# Patient Record
Sex: Male | Born: 1970
Health system: Southern US, Community
[De-identification: ages and names within clinical notes are randomized; demographics above are authoritative.]

## PROBLEM LIST (undated history)

## (undated) ENCOUNTER — Emergency Department (HOSPITAL_COMMUNITY): Discharge status: Against Medical Advice | Payer: Managed Care, Other (non HMO)

## (undated) DIAGNOSIS — G4730 Sleep apnea, unspecified: Secondary | ICD-10-CM

## (undated) DIAGNOSIS — E785 Hyperlipidemia, unspecified: Secondary | ICD-10-CM

## (undated) DIAGNOSIS — Z86718 Personal history of other venous thrombosis and embolism: Secondary | ICD-10-CM

## (undated) DIAGNOSIS — J189 Pneumonia, unspecified organism: Secondary | ICD-10-CM

## (undated) DIAGNOSIS — F419 Anxiety disorder, unspecified: Secondary | ICD-10-CM

## (undated) DIAGNOSIS — T7840XA Allergy, unspecified, initial encounter: Secondary | ICD-10-CM

## (undated) DIAGNOSIS — E119 Type 2 diabetes mellitus without complications: Secondary | ICD-10-CM

## (undated) DIAGNOSIS — F32A Depression, unspecified: Secondary | ICD-10-CM

## (undated) DIAGNOSIS — A159 Respiratory tuberculosis unspecified: Secondary | ICD-10-CM

## (undated) DIAGNOSIS — G43909 Migraine, unspecified, not intractable, without status migrainosus: Secondary | ICD-10-CM

## (undated) DIAGNOSIS — Z9189 Other specified personal risk factors, not elsewhere classified: Secondary | ICD-10-CM

## (undated) DIAGNOSIS — R569 Unspecified convulsions: Secondary | ICD-10-CM

## (undated) DIAGNOSIS — N289 Disorder of kidney and ureter, unspecified: Secondary | ICD-10-CM

## (undated) DIAGNOSIS — R51 Headache: Secondary | ICD-10-CM

## (undated) DIAGNOSIS — N200 Calculus of kidney: Secondary | ICD-10-CM

## (undated) DIAGNOSIS — G40909 Epilepsy, unspecified, not intractable, without status epilepticus: Secondary | ICD-10-CM

## (undated) DIAGNOSIS — E669 Obesity, unspecified: Secondary | ICD-10-CM

## (undated) DIAGNOSIS — J45909 Unspecified asthma, uncomplicated: Secondary | ICD-10-CM

## (undated) DIAGNOSIS — I82409 Acute embolism and thrombosis of unspecified deep veins of unspecified lower extremity: Secondary | ICD-10-CM

## (undated) DIAGNOSIS — F329 Major depressive disorder, single episode, unspecified: Secondary | ICD-10-CM

## (undated) HISTORY — DX: Obesity, unspecified: E66.9

## (undated) HISTORY — PX: VASECTOMY: SHX75

## (undated) HISTORY — DX: Sleep apnea, unspecified: G47.30

## (undated) HISTORY — DX: Type 2 diabetes mellitus without complications: E11.9

## (undated) HISTORY — DX: Personal history of other venous thrombosis and embolism: Z86.718

## (undated) HISTORY — DX: Hyperlipidemia, unspecified: E78.5

## (undated) HISTORY — DX: Unspecified convulsions: R56.9

## (undated) HISTORY — DX: Other specified personal risk factors, not elsewhere classified: Z91.89

## (undated) HISTORY — DX: Headache: R51

## (undated) HISTORY — PX: BRAIN SURGERY: SHX531

## (undated) HISTORY — DX: Disorder of kidney and ureter, unspecified: N28.9

## (undated) HISTORY — DX: Migraine, unspecified, not intractable, without status migrainosus: G43.909

## (undated) HISTORY — DX: Acute embolism and thrombosis of unspecified deep veins of unspecified lower extremity: I82.409

## (undated) HISTORY — DX: Calculus of kidney: N20.0

## (undated) HISTORY — DX: Unspecified asthma, uncomplicated: J45.909

## (undated) HISTORY — DX: Anxiety disorder, unspecified: F41.9

## (undated) HISTORY — DX: Depression, unspecified: F32.A

## (undated) HISTORY — DX: Allergy, unspecified, initial encounter: T78.40XA

## (undated) HISTORY — DX: Epilepsy, unspecified, not intractable, without status epilepticus: G40.909

## (undated) HISTORY — PX: SPINE SURGERY: SHX786

## (undated) HISTORY — DX: Major depressive disorder, single episode, unspecified: F32.9

## (undated) HISTORY — PX: OTHER SURGICAL HISTORY: SHX169

---

## 2010-08-02 HISTORY — PX: TOE AMPUTATION: SHX809

## 2012-05-23 ENCOUNTER — Telehealth: Payer: Self-pay | Admitting: Family Medicine

## 2012-05-23 ENCOUNTER — Encounter: Payer: Self-pay | Admitting: Family Medicine

## 2012-05-23 ENCOUNTER — Ambulatory Visit (INDEPENDENT_AMBULATORY_CARE_PROVIDER_SITE_OTHER): Payer: Managed Care, Other (non HMO) | Admitting: Family Medicine

## 2012-05-23 VITALS — BP 114/80 | HR 96 | Temp 98.3°F | Ht 71.0 in | Wt 298.0 lb

## 2012-05-23 DIAGNOSIS — E785 Hyperlipidemia, unspecified: Secondary | ICD-10-CM

## 2012-05-23 DIAGNOSIS — I82409 Acute embolism and thrombosis of unspecified deep veins of unspecified lower extremity: Secondary | ICD-10-CM

## 2012-05-23 DIAGNOSIS — F3289 Other specified depressive episodes: Secondary | ICD-10-CM

## 2012-05-23 DIAGNOSIS — G40909 Epilepsy, unspecified, not intractable, without status epilepticus: Secondary | ICD-10-CM

## 2012-05-23 DIAGNOSIS — F329 Major depressive disorder, single episode, unspecified: Secondary | ICD-10-CM

## 2012-05-23 DIAGNOSIS — Z86718 Personal history of other venous thrombosis and embolism: Secondary | ICD-10-CM

## 2012-05-23 DIAGNOSIS — F32A Depression, unspecified: Secondary | ICD-10-CM

## 2012-05-23 DIAGNOSIS — I2699 Other pulmonary embolism without acute cor pulmonale: Secondary | ICD-10-CM

## 2012-05-23 DIAGNOSIS — E119 Type 2 diabetes mellitus without complications: Secondary | ICD-10-CM

## 2012-05-23 HISTORY — DX: Personal history of other venous thrombosis and embolism: Z86.718

## 2012-05-23 HISTORY — DX: Epilepsy, unspecified, not intractable, without status epilepticus: G40.909

## 2012-05-23 HISTORY — DX: Acute embolism and thrombosis of unspecified deep veins of unspecified lower extremity: I82.409

## 2012-05-23 LAB — LIPID PANEL: Cholesterol: 224 mg/dL — ABNORMAL HIGH (ref 0–200)

## 2012-05-23 LAB — COMPREHENSIVE METABOLIC PANEL
ALT: 47 U/L (ref 0–53)
Albumin: 3.9 g/dL (ref 3.5–5.2)
CO2: 28 mEq/L (ref 19–32)
Calcium: 9.3 mg/dL (ref 8.4–10.5)
Chloride: 98 mEq/L (ref 96–112)
GFR: 96.43 mL/min (ref >60.00)
Glucose, Bld: 263 mg/dL — ABNORMAL HIGH (ref 70–99)
Sodium: 135 mEq/L (ref 135–145)
Total Protein: 7.7 g/dL (ref 6.0–8.3)

## 2012-05-23 LAB — CBC WITH DIFFERENTIAL/PLATELET
Basophils Relative: 0.8 % (ref 0.0–3.0)
Eosinophils Relative: 1 % (ref 0.0–5.0)
HCT: 43.5 % (ref 39.0–52.0)
Hemoglobin: 14.4 g/dL (ref 13.0–17.0)
Lymphs Abs: 3 10*3/uL (ref 0.7–4.0)
MCV: 88 fl (ref 78.0–100.0)
Monocytes Absolute: 0.5 10*3/uL (ref 0.1–1.0)
Neutro Abs: 4.6 10*3/uL (ref 1.4–7.7)
Neutrophils Relative %: 56.3 % (ref 43.0–77.0)
RBC: 4.94 Mil/uL (ref 4.22–5.81)
WBC: 8.1 10*3/uL (ref 4.5–10.5)
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
## 2012-05-23 NOTE — Telephone Encounter (Signed)
Left a message for pt to return call 

## 2012-05-23 NOTE — Telephone Encounter (Signed)
Please let Bryan Mcmillan know:  Unfortunately, his labs show his diabetes is  out of control and indicate this has been going on for some time. I would advise he increase his metformin to 500mg  PO twice daily today. He needs to eat regular small and healthy meals. He needs to follow up with me in the next week for a 30 minute appointment to discuss his diabetes and start another medication - likely insulin. We will get him a monitor and instruction that day.  His cholesterol is very high. I would advise starting a medication for this. We can discuss this at his appointment.

## 2012-05-23 NOTE — Patient Instructions (Signed)
-  We have ordered labs or studies at this visit. It usually takes 1-2 weeks for results and processing. We will contact you with instructions IF your results are abnormal. Normal results will be released to your [facility] in 1-2 weeks. If you have not heard from [place] or can not find your results in [facility] in 2 weeks please contact our office.  -We placed a referral for you as discussed. It usually takes about 1-2 weeks to process and schedule this referral. If you have not heard from [place] regarding this appointment in 2 weeks please contact our office.  -follow up in 2 weeks or sooner if concerns  Thank you for enrolling in MyChart. Please follow the instructions below to securely access your online medical record. MyChart allows you to send messages to your doctor, view your test results, renew your prescriptions, schedule appointments, and more.  How Do I Sign Up? 1. In your Internet browser, go to [email or web address] 2. Click on the New  User? link in the Sign In box.  3. Enter your MyChart Access Code exactly as it appears below. You will not need to use this code after you have completed the sign-up process. If you do not sign up before the expiration date, you must request a new code. MyChart Access Code: [email or web address] Expires: 06/22/2012  9:02 AM  4. Enter the last four digits of your Social Security Number (xxxx) and Date of Birth (mm/dd/yyyy) as indicated and click Next. You will be taken to the next sign-up page. 5. Create a MyChart ID. This will be your MyChart login ID and cannot be changed, so think of one that is secure and easy to remember. 6. Create a MyChart password. You can change your password at any time. 7. Enter your Password Reset Question and Answer and click Next. This can be used at a later time if you forget your password.  8. Select your communication preference, and if applicable enter your e-mail address. You will receive e-mail notification when new  information is available in MyChart by choosing to receive e-mail notifications and filling in your e-mail. 9. Click Sign In. You can now view your medical record.   Additional Information If you have questions, you can email [email or web address] or call [phone] to talk to our MyChart staff. Remember, MyChart is NOT to be used for urgent needs. For medical emergencies, dial 911.

## 2012-05-24 NOTE — Telephone Encounter (Signed)
Left a message for pt to return call 

## 2012-05-25 NOTE — Telephone Encounter (Signed)
Called and spoke with pt and pt is aware.  Pt has an upcoming appt on 06/01/12.

## 2012-06-01 ENCOUNTER — Encounter: Payer: Self-pay | Admitting: Family Medicine

## 2012-06-01 ENCOUNTER — Ambulatory Visit (INDEPENDENT_AMBULATORY_CARE_PROVIDER_SITE_OTHER): Payer: Managed Care, Other (non HMO) | Admitting: Family Medicine

## 2012-06-01 VITALS — BP 100/70 | HR 113 | Temp 98.2°F | Wt 290.0 lb

## 2012-06-01 DIAGNOSIS — E785 Hyperlipidemia, unspecified: Secondary | ICD-10-CM

## 2012-06-01 DIAGNOSIS — E119 Type 2 diabetes mellitus without complications: Secondary | ICD-10-CM

## 2012-06-01 DIAGNOSIS — E1165 Type 2 diabetes mellitus with hyperglycemia: Secondary | ICD-10-CM

## 2012-06-01 MED ORDER — INSULIN GLARGINE 100 UNIT/ML ~~LOC~~ SOLN: SUBCUTANEOUS | Status: DC

## 2012-06-01 MED ORDER — PRAVASTATIN SODIUM 40 MG PO TABS: 40.0000 mg | ORAL_TABLET | Freq: Every day | ORAL | Status: DC

## 2012-06-01 MED ORDER — INSULIN PEN NEEDLE 32G X 4 MM MISC: Status: DC

## 2012-06-01 NOTE — Progress Notes (Addendum)
Chief Complaint  Patient presents with  . 2 week follow up    HPI: Bryan Mcmillan  recently moved to the area and was found to have uncontrolled diabetes and elevated cholesterol on labs. He is here to address these issues today.  DM: -reported hx diet controlled DM -Hgba1c 10.2 on 05/23/12 -denies: polyuria, polydipsia, neuropathy, foot woulds, vision changes -optho: sees eye doctor regularly -now on meformin 500mg  bid -in the past no regular exericise, diet poor - but sister dietician and he is working with her, starting walking at work, want referral to nutricianist -BS fasting usually around 270  Hyperlipidemia: -total 224, HDL 28, LDL 171 on 05/23/12 -not on statin  ROS: See pertinent positives and negatives per HPI.  Past Medical History  Diagnosis Date  . Asthma   . Depression   . Diabetes   . Headache     frequent  . Kidney disease   . Kidney stones   . Migraines   . History of blood clots   . Seizure     Family History  Problem Relation Age of Onset  . Hyperlipidemia      parent  . Heart disease      parent  . Stroke      parent  . Diabetes      parent  . Diabetes      grandparent     History   Social History  . Marital Status: Single    Spouse Name: N/A    Number of Children: N/A  . Years of Education: N/A   Social History Main Topics  . Smoking status: Former Games developer  . Smokeless tobacco: None  . Alcohol Use: No  . Drug Use: None  . Sexually Active: None   Other Topics Concern  . None   Social History Narrative  . None    Current outpatient prescriptions:aspirin-acetaminophen-caffeine (EXCEDRIN MIGRAINE) 250-250-65 MG per tablet, Take 1 tablet by mouth every 6 (six) hours as needed., Disp: , Rfl: ;  citalopram (CELEXA) 20 MG tablet, Take 20 mg by mouth daily. , Disp: , Rfl: ;  enoxaparin (LOVENOX) 150 MG/ML injection, Inject 150 mg into the skin every 12 (twelve) hours. , Disp: , Rfl:  levETIRAcetam (KEPPRA) 500 MG tablet, Take 500  mg by mouth 2 (two) times daily. , Disp: , Rfl: ;  metFORMIN (GLUCOPHAGE) 500 MG tablet, Take 500 mg by mouth daily. , Disp: , Rfl: ;  insulin glargine (LANTUS SOLOSTAR) 100 UNIT/ML injection, 10 units daily to start. Titrate up by 2 units every 3 days to reach fasting blood sugar goal of 70-130, Disp: 5 pen, Rfl: PRN pravastatin (PRAVACHOL) 40 MG tablet, Take 1 tablet (40 mg total) by mouth daily., Disp: 90 tablet, Rfl: 3  EXAM:  Filed Vitals:   06/01/12 0804  BP: 100/70  Pulse: 113  Temp: 98.2 F (36.8 C)    There is no height on file to calculate BMI.  GENERAL: vitals reviewed and listed above, alert, oriented, appears well hydrated and in no acute distress Stuttering speech HEENT: atraumatic, conjunttiva clear, no obvious abnormalities on inspection of external nose and ears, sl left sided droop of mouth  NECK: no obvious masses on inspection  LUNGS: clear to auscultation bilaterally, no wheezes, rales or rhonchi, good air movement  CV: HRRR, no peripheral edema  MS: moves all extremities without noticeable abnormality  PSYCH: pleasant and cooperative, no obvious depression or anxiety  ASSESSMENT AND PLAN:  Discussed the following assessment and plan:  1. Uncontrolled diabetes mellitus  Ambulatory referral to diabetic education, insulin glargine (LANTUS SOLOSTAR) 100 UNIT/ML injection  2. Hyperlipidemia LDL goal < 100  pravastatin (PRAVACHOL) 40 MG tablet    -start lantus 10 units nightly and titrate every three days to FBS 70-130 -congratulated on weight loss -follow up in 1 month -insulin instruction, monitor and instruction provided -lifestyle counseling provided  -Patient advised to return or notify a doctor immediately if symptoms worsen or persist or new concerns arise.  Patient Instructions  -check blood sugar every morning before you eat anything (fasting) and once a day 2 hours after a meal (postprandial) - keep a written log of these Blood Sugar values with  date and time  -start with 10 units of lantus daily, increase every 3 days by 2 units IF FASTING BLOOD SUGAR greater then 130. Goal is fasting blood sugars between 70-130 without low blood sugars.  -if low blood sugar (less then 70) drink 1/4 cup of orange juice or eat a snack and call doctor immediately  We recommend the following healthy lifestyle measures: - eat a healthy diet consisting of lots of vegetables, fruits, beans, nuts, seeds, healthy meats such as white chicken and fish and whole grains.  - avoid fried foods, fast food, processed foods, sodas, red meet and other fattening foods.  - get a least 150 minutes of aerobic exercise per week.   -follow up in 1 month       Bryan Bills R.  Addendum: received some records including echo and stress test from earlier this year showing preserved EF and some enlargement of heart - poor valvular study.

## 2012-06-01 NOTE — Patient Instructions (Addendum)
-  check blood sugar every morning before you eat anything (fasting) and once a day 2 hours after a meal (postprandial) - keep a written log of these Blood Sugar values with date and time  -start with 10 units of lantus daily, increase every 3 days by 2 units IF FASTING BLOOD SUGAR greater then 130. Goal is fasting blood sugars between 70-130 without low blood sugars.  -if low blood sugar (less then 70) drink 1/4 cup of orange juice or eat a snack and call doctor immediately  We recommend the following healthy lifestyle measures: - eat a healthy diet consisting of lots of vegetables, fruits, beans, nuts, seeds, healthy meats such as white chicken and fish and whole grains.  - avoid fried foods, fast food, processed foods, sodas, red meet and other fattening foods.  - get a least 150 minutes of aerobic exercise per week.   -follow up in 1 month

## 2012-06-01 NOTE — Addendum Note (Signed)
Addended by: Azucena Freed on: 06/01/2012 09:59 AM   Modules accepted: Orders

## 2012-06-08 ENCOUNTER — Encounter: Payer: Self-pay | Admitting: Family Medicine

## 2012-06-20 ENCOUNTER — Emergency Department (HOSPITAL_BASED_OUTPATIENT_CLINIC_OR_DEPARTMENT_OTHER): Payer: Managed Care, Other (non HMO)

## 2012-06-20 ENCOUNTER — Emergency Department (HOSPITAL_BASED_OUTPATIENT_CLINIC_OR_DEPARTMENT_OTHER)
Admission: EM | Admit: 2012-06-20 | Discharge: 2012-06-20 | Disposition: A | Discharge status: Home / Self Care | Payer: Managed Care, Other (non HMO) | Attending: Emergency Medicine | Admitting: Emergency Medicine

## 2012-06-20 ENCOUNTER — Encounter (HOSPITAL_BASED_OUTPATIENT_CLINIC_OR_DEPARTMENT_OTHER): Payer: Self-pay | Admitting: *Deleted

## 2012-06-20 DIAGNOSIS — E119 Type 2 diabetes mellitus without complications: Secondary | ICD-10-CM | POA: Insufficient documentation

## 2012-06-20 DIAGNOSIS — Z87442 Personal history of urinary calculi: Secondary | ICD-10-CM | POA: Insufficient documentation

## 2012-06-20 DIAGNOSIS — Z8669 Personal history of other diseases of the nervous system and sense organs: Secondary | ICD-10-CM | POA: Insufficient documentation

## 2012-06-20 DIAGNOSIS — Z79899 Other long term (current) drug therapy: Secondary | ICD-10-CM | POA: Insufficient documentation

## 2012-06-20 DIAGNOSIS — R110 Nausea: Secondary | ICD-10-CM | POA: Insufficient documentation

## 2012-06-20 DIAGNOSIS — Z794 Long term (current) use of insulin: Secondary | ICD-10-CM | POA: Insufficient documentation

## 2012-06-20 DIAGNOSIS — F329 Major depressive disorder, single episode, unspecified: Secondary | ICD-10-CM | POA: Insufficient documentation

## 2012-06-20 DIAGNOSIS — R51 Headache: Secondary | ICD-10-CM | POA: Insufficient documentation

## 2012-06-20 DIAGNOSIS — F3289 Other specified depressive episodes: Secondary | ICD-10-CM | POA: Insufficient documentation

## 2012-06-20 DIAGNOSIS — Z7982 Long term (current) use of aspirin: Secondary | ICD-10-CM | POA: Insufficient documentation

## 2012-06-20 DIAGNOSIS — J45909 Unspecified asthma, uncomplicated: Secondary | ICD-10-CM | POA: Insufficient documentation

## 2012-06-20 LAB — BASIC METABOLIC PANEL
BUN: 22 mg/dL (ref 6–23)
Creatinine, Ser: 0.7 mg/dL (ref 0.50–1.35)
GFR calc Af Amer: >90 mL/min (ref >90)
GFR calc non Af Amer: >90 mL/min (ref >90)
Potassium: 4.3 mEq/L (ref 3.5–5.1)

## 2012-06-20 LAB — CBC WITH DIFFERENTIAL/PLATELET
Basophils Absolute: 0 10*3/uL (ref 0.0–0.1)
Basophils Relative: 0 % (ref 0–1)
Eosinophils Absolute: 0.1 10*3/uL (ref 0.0–0.7)
Hemoglobin: 14.5 g/dL (ref 13.0–17.0)
MCH: 28.8 pg (ref 26.0–34.0)
MCHC: 34.2 g/dL (ref 30.0–36.0)
Monocytes Absolute: 0.8 10*3/uL (ref 0.1–1.0)
Monocytes Relative: 9 % (ref 3–12)
Neutrophils Relative %: 56 % (ref 43–77)
RDW: 12.6 % (ref 11.5–15.5)

## 2012-06-20 MED ORDER — DIPHENHYDRAMINE HCL 50 MG/ML IJ SOLN
25.0000 mg | Freq: Once | INTRAMUSCULAR | Status: AC
  Administered 2012-06-20: 25 mg via INTRAVENOUS
  Filled 2012-06-20: qty 1

## 2012-06-20 MED ORDER — METOCLOPRAMIDE HCL 5 MG/ML IJ SOLN
10.0000 mg | Freq: Once | INTRAMUSCULAR | Status: AC
  Administered 2012-06-20: 10 mg via INTRAVENOUS
  Filled 2012-06-20: qty 2

## 2012-06-20 MED ORDER — KETOROLAC TROMETHAMINE 30 MG/ML IJ SOLN
30.0000 mg | Freq: Once | INTRAMUSCULAR | Status: AC
  Administered 2012-06-20: 30 mg via INTRAVENOUS
  Filled 2012-06-20: qty 1

## 2012-06-20 NOTE — ED Notes (Signed)
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

## 2012-06-20 NOTE — ED Provider Notes (Signed)
History     CSN: [id number]  Arrival date & time 06/20/12  1218   First MD Initiated Contact with Patient 06/20/12 1309      Chief Complaint  Patient presents with  . Migraine    (Consider location/radiation/quality/duration/timing/severity/associated sxs/prior treatment) Patient is a 41 y.o. male presenting with headaches. The history is provided by the patient and a parent. No language interpreter was used.  Headache  This is a new problem. The current episode started 1 to 2 hours ago. The problem occurs constantly. The problem has been gradually worsening. The headache is associated with nothing. The pain is located in the frontal region. The quality of the pain is described as sharp. The pain is at a severity of 7/10. The pain is severe. The pain does not radiate. Associated symptoms include nausea. Pertinent negatives include no vomiting. He has tried nothing for the symptoms. The treatment provided no relief.  Pt complains of a severe headache.  Pt has had a craniotomy.  Pt has had migraines since headaches. No local neurologist.    Past Medical History  Diagnosis Date  . Asthma   . Depression   . Diabetes   . Headache     frequent  . Kidney disease   . Kidney stones   . Migraines   . History of blood clots   . Seizure     Past Surgical History  Procedure Date  . Brain surgery   . Filter for blood clots     Family History  Problem Relation Age of Onset  . Hyperlipidemia      parent  . Heart disease      parent  . Stroke      parent  . Diabetes      parent  . Diabetes      grandparent     History  Substance Use Topics  . Smoking status: Former Games developer  . Smokeless tobacco: Not on file  . Alcohol Use: No      Review of Systems  Gastrointestinal: Positive for nausea. Negative for vomiting.  Neurological: Positive for headaches.  All other systems reviewed and are negative.    Allergies  Penicillins  Home Medications   Current Outpatient Rx   Name  Route  Sig  Dispense  Refill  . ASPIRIN-ACETAMINOPHEN-CAFFEINE 250-250-65 MG PO TABS   Oral   Take 1 tablet by mouth every 6 (six) hours as needed.         Marland Kitchen CITALOPRAM HYDROBROMIDE 20 MG PO TABS   Oral   Take 20 mg by mouth daily.          Marland Kitchen ENOXAPARIN SODIUM 150 MG/ML [place] SOLN   Subcutaneous   Inject 150 mg into the skin every 12 (twelve) hours.          . INSULIN GLARGINE 100 UNIT/ML  SOLN      10 units daily to start. Titrate up by 2 units every 3 days to reach fasting blood sugar goal of 70-130   5 pen   PRN   . INSULIN PEN NEEDLE 32G X 4 MM MISC      Use as directed.   100 each   5   . LEVETIRACETAM 500 MG PO TABS   Oral   Take 500 mg by mouth 2 (two) times daily.          Marland Kitchen METFORMIN HCL 500 MG PO TABS   Oral   Take 500 mg by mouth daily.          Marland Kitchen  PRAVASTATIN SODIUM 40 MG PO TABS   Oral   Take 1 tablet (40 mg total) by mouth daily.   90 tablet   3     BP 130/84  Pulse 88  Temp 97 F (36.1 C) (Oral)  Resp 20  SpO2 98%  Physical Exam  Nursing note and vitals reviewed. Constitutional: He is oriented to person, place, and time. He appears well-developed and well-nourished.  HENT:  Head: Normocephalic and atraumatic.  Right Ear: External ear normal.  Left Ear: External ear normal.  Nose: Nose normal.  Mouth/Throat: Oropharynx is clear and moist.  Eyes: Conjunctivae normal and EOM are normal. Pupils are equal, round, and reactive to light.  Neck: Normal range of motion. Neck supple.  Cardiovascular: Normal rate and normal heart sounds.   Pulmonary/Chest: Effort normal and breath sounds normal.  Abdominal: Soft. Bowel sounds are normal.  Musculoskeletal: Normal range of motion.  Neurological: He is alert and oriented to person, place, and time. He has normal reflexes.  Skin: Skin is warm.  Psychiatric: He has a normal mood and affect.    ED Course  Procedures (including critical care time)  Labs Reviewed - No data to  display No results found.   No diagnosis found.    MDM  Pt given Reglan, torodol and benadryl.    Pt reports feeling better.   Dr. Anitra Lauth in to see and examine.   Pt had an episode of jerking.  Nurse called me in room to see.  Pt hadarm and leg jerking irregular,  Pt able to talk through. I doubt seizure,   I advised follow up with neurologist.   Head Ct is normal        Lonia Skinner [place], [place] 06/20/12 1455

## 2012-06-20 NOTE — ED Provider Notes (Signed)
Medical screening examination/treatment/procedure(s) were conducted as a shared visit with non-physician practitioner(s) and myself.  I personally evaluated the patient during the encounter Patient with a complicated medical past including a craniotomy for removal of a mass and blood clot. Since that time he has had severe migraines have been complex in nature intermittently as well as seizures for which he is on Keppra. Today his headache seems similar to prior complex seizures. Head CT is negative for acute bleed, increased intercranial pressure or other acute pathology. Patient's symptoms are improving since the headache has started. Low concern for stroke at this time.  Patient will need followup with neurology as he is new in town.  Gwyneth Sprout, MD 06/20/12 1537

## 2012-06-21 ENCOUNTER — Encounter: Payer: Self-pay | Admitting: Dietician

## 2012-06-21 ENCOUNTER — Encounter: Payer: Managed Care, Other (non HMO) | Attending: Family Medicine | Admitting: Dietician

## 2012-06-21 VITALS — Ht 71.0 in | Wt 287.5 lb

## 2012-06-21 DIAGNOSIS — Z713 Dietary counseling and surveillance: Secondary | ICD-10-CM | POA: Insufficient documentation

## 2012-06-21 DIAGNOSIS — E119 Type 2 diabetes mellitus without complications: Secondary | ICD-10-CM | POA: Insufficient documentation

## 2012-06-21 NOTE — Progress Notes (Signed)
  Medical Nutrition Therapy:  Appt start time: 0815 end time:  0930.   Assessment:  Primary concerns today: Comes today want to learn how to get the diabetes fasting at 110 mg daily.  Find out how to eat, be able to go out and enjoy life.  Currently enjoys his lifestyle.  Since his last MD visit, he has lost 3.5 lbs.  His A1C on 05/23/12 was 10.2%.  Verbalizes a lifestyle that for the last few years and currently high in stress.  His goal is go gain control of his glucose levels and try to lose weight and eventually use oral medications, diet and exercise for glucose control.  HYPERGLYCEMIA:  Currently does not note any of the S/S for increased blood glucose.  HYPOGLYCEMIA:  Currently has not experienced any S/S for low blood glucose.  BLOOD GLUCOSE MONITORING: Monitoring 3 times per day.  Fasting range: 135-225-141 mg/dl  Post meal:  [phone]  Mg/dl  HS:  660-630 mg/dl  MEDICATIONS: Completed medication review.  Currently at Lantus 18 units at HS.   DIETARY INTAKE:  24-hr recall:  B ( AM): &:30 Water with meds.  Protein drink (mix with cup of milk 2%)  And maybe 1 tab of PB and maybe some ice.    Snk ( AM): large cup water and ice to sip on therough out the day.  12:00 break of fruit  L ( PM): 2:00-2:30 Meat (deli) and cheese and Tereso Unangst have a bag of M&M's and water Snk ( PM): 4:40-4:15 trail mix or M&M's  D ( PM):  Chicken,  corn and cream sauce and rice (1 cup rice,  And 3 broccoli spears and 3/4 cup over sauce) and second serving 1/2 portion of first serving without the rice.broccoli on the side.   Snk ( PM): ice cream and twix bars and pie  Beverages: water or milk, or protein drink, or juice  Usual physical activity: No planned regimen other than walking the stairs and keeping up the house work to help his mom.  Dad still lives in [place]  Estimated energy needs:  HT: 71 in  WT: 287.5 lb  BMI: 40.2 kg/m2  Adj WT:  216 lb  (98 kg) 1800 calories 200-205 g carbohydrates 130-135 g  protein 48-50 g fat  Progress Towards Goal(s):  In progress.   Nutritional Diagnosis:  Peoa-2.1 Inpaired nutrition utilization As related to blood glucose.  As evidenced by diagnosis of type 2 diabetes, A1C of 10.2%, and elevated blood glucose requiring Lantus insulin for assisting with glucose control..    Intervention:  Nutrition Review of the food groups and their influence on blood glucose levels.  Review of the carb restricted diet for blood glucose control.  Review of label reading, portion control.  Provide handouts to assist with these.  Review of the impact of exercise on blood glucose levels. Recommended trying to have a carb source at all meals.  Review of the need to distribute carb foods evenly through out the day.  Handouts given during visit include:  Living Well with Diabetes  Controlling Blood Glucose  Non-starch Vegetable list  45 and 60 gm CHO menu suggestions.  Snack list  Monitoring/Evaluation:  Dietary intake, exercise, blood glucose levels, and body weight in 8-12 weeks.  To check with his insurance regarding coverage for visits.Marland Kitchen

## 2012-06-23 ENCOUNTER — Telehealth: Payer: Self-pay | Admitting: Family Medicine

## 2012-06-23 NOTE — Telephone Encounter (Signed)
Please send letter: -need his updated contact info where we can reach him -referral placed first visit and they have been tryin gto contact him -give him number for psych - if any thought of self harm needs to call 911, go to ED immediatley

## 2012-06-23 NOTE — Telephone Encounter (Signed)
pls advise

## 2012-06-23 NOTE — Telephone Encounter (Signed)
Called pt and his vm has not been set up and a message could not be left for patient.  According to the notes from the referral to neurology pt's could not be contacted.  Willa attempt to call pt at a later time.

## 2012-06-23 NOTE — Telephone Encounter (Signed)
Referred to neurology at his first visit with me. I think we were not supposed to refer for psych? Please give him psychiatrist he can call to schedule with.

## 2012-06-23 NOTE — Telephone Encounter (Signed)
Pt went to Urgent Care re: severe migraine. Urgent Care referred pt to Neurologist, but pt needs referral from Dr Selena Batten in order to see the spec. Pls do referral to Dr Anne Hahn at [facility]   Pt also needs a referral to psychiatrist.

## 2012-06-26 ENCOUNTER — Encounter: Payer: Self-pay | Admitting: Family Medicine

## 2012-06-26 ENCOUNTER — Ambulatory Visit (INDEPENDENT_AMBULATORY_CARE_PROVIDER_SITE_OTHER): Payer: Managed Care, Other (non HMO) | Admitting: Family Medicine

## 2012-06-26 VITALS — BP 100/70 | HR 81 | Temp 97.8°F | Wt 286.0 lb

## 2012-06-26 DIAGNOSIS — G40909 Epilepsy, unspecified, not intractable, without status epilepticus: Secondary | ICD-10-CM

## 2012-06-26 DIAGNOSIS — Z86718 Personal history of other venous thrombosis and embolism: Secondary | ICD-10-CM

## 2012-06-26 DIAGNOSIS — E119 Type 2 diabetes mellitus without complications: Secondary | ICD-10-CM

## 2012-06-26 DIAGNOSIS — E785 Hyperlipidemia, unspecified: Secondary | ICD-10-CM

## 2012-06-26 DIAGNOSIS — F329 Major depressive disorder, single episode, unspecified: Secondary | ICD-10-CM

## 2012-06-26 DIAGNOSIS — Z23 Encounter for immunization: Secondary | ICD-10-CM

## 2012-06-26 NOTE — Addendum Note (Signed)
Addended by: Azucena Freed on: 06/26/2012 09:07 AM   Modules accepted: Orders

## 2012-06-26 NOTE — Patient Instructions (Addendum)
-  use newer meter if possible  -continue to titrate lantus up by 2-3 units every 3 days if fasting blood sugar not at goal GOAL FASTING: is less then 130 consistently (70-120 is best) Call and eat a small snack -  if any low blood sugars  (< 70)  -schedule neurology appointment and make sure we have a good number to reach you on  -PLEASE call [phone] ext [phone] to schedule your neurology appointment - you were already referred last month  -please schedule follow up in 2 months

## 2012-06-26 NOTE — Telephone Encounter (Signed)
Pt was in for visit on 04/26/2012 and received information about contacting neurology and given the information to set up physch appt..  Pt states he tried calling the first two but not the last one.  Given the information to call the last place. Verified pt's contact information.

## 2012-06-26 NOTE — Progress Notes (Signed)
Chief Complaint  Patient presents with  . Follow-up    severe headache, shaking, and diabetes     HPI: Bryan Mcmillan  recently moved to the area and was found to have uncontrolled diabetes and elevated cholesterol on labs. He is here for follow up of these issues today.  DM: -lifestyle counseling and started on lantus last visit with titration recs (20 units daily now), also on metformin -Hgba1c 10.2 on 05/23/12 -denies: polyuria, polydipsia, neuropathy, foot woulds, vision changes -optho: sees eye doctor regularly -in the past no regular exericise, diet poor - but sister dietician and he is working with her, starting walking at work, want referral to nutricianist -Home BS: fasting is ave 130, postprandial is 130-140s  Hyperlipidemia: -total 224, HDL 28, LDL 171 on 05/23/12 -started on pravachol last visit  Migraines/Hx venous thromboses in brain and seizure disorder: -referred to neuro first visit here - pt did not answer any message when appointment scheduled -pt called recently after ED visit for migraine and seizure for neuro referral and scheduler and nurse unable to meet him -reports we need to use he mother's phone to reach him -on keppra and lovenox  Depression: On celexa Provided list of counselors and was supposed to see psych per his request Denies SI  ROS: See pertinent positives and negatives per HPI.  Past Medical History  Diagnosis Date  . Asthma   . Depression   . Diabetes   . Headache     frequent  . Kidney disease   . Kidney stones   . Migraines   . History of blood clots   . Seizure     Family History  Problem Relation Age of Onset  . Hyperlipidemia      parent  . Heart disease      parent  . Stroke      parent  . Diabetes      parent  . Diabetes      grandparent     History   Social History  . Marital Status: Single    Spouse Name: N/A    Number of Children: N/A  . Years of Education: N/A   Social History Main Topics  .  Smoking status: Former Games developer  . Smokeless tobacco: None  . Alcohol Use: No  . Drug Use: None  . Sexually Active: None   Other Topics Concern  . None   Social History Narrative  . None    Current outpatient prescriptions:aspirin-acetaminophen-caffeine (EXCEDRIN MIGRAINE) 250-250-65 MG per tablet, Take 1 tablet by mouth every 6 (six) hours as needed., Disp: , Rfl: ;  citalopram (CELEXA) 20 MG tablet, Take 20 mg by mouth daily. , Disp: , Rfl: ;  enoxaparin (LOVENOX) 150 MG/ML injection, Inject 150 mg into the skin every 12 (twelve) hours. , Disp: , Rfl:  insulin glargine (LANTUS SOLOSTAR) 100 UNIT/ML injection, 10 units daily to start. Titrate up by 2 units every 3 days to reach fasting blood sugar goal of 70-130, Disp: 5 pen, Rfl: PRN;  Insulin Pen Needle 32G X 4 MM MISC, Use as directed., Disp: 100 each, Rfl: 5;  levETIRAcetam (KEPPRA) 500 MG tablet, Take 500 mg by mouth 2 (two) times daily. , Disp: , Rfl: ;  metFORMIN (GLUCOPHAGE) 500 MG tablet, Take 500 mg by mouth daily. , Disp: , Rfl:  pravastatin (PRAVACHOL) 40 MG tablet, Take 1 tablet (40 mg total) by mouth daily., Disp: 90 tablet, Rfl: 3  EXAM:  Filed Vitals:   06/26/12 0820  BP: 100/70  Pulse: 81  Temp: 97.8 F (36.6 C)    There is no height on file to calculate BMI.  GENERAL: vitals reviewed and listed above, alert, oriented, appears well hydrated and in no acute distress Stuttering speech HEENT: atraumatic, conjunttiva clear, no obvious abnormalities on inspection of external nose and ears, sl left sided droop of mouth  NECK: no obvious masses on inspection  LUNGS: clear to auscultation bilaterally, no wheezes, rales or rhonchi, good air movement  CV: HRRR, no peripheral edema  MS: moves all extremities without noticeable abnormality  PSYCH: pleasant and cooperative, no obvious depression or anxiety  ASSESSMENT AND PLAN:  Discussed the following assessment and plan:  1. Diabetes mellitus  -continue to titrate  lantus slowly -call with questions -continue lifestyle changes  2. History of blood clot in brain, 2012   3. Seizure disorder   4. Depression   5. Hyperlipidemia  -cont statin    -Patient advised to return or notify a doctor immediately if symptoms worsen or persist or new concerns arise.  There are no Patient Instructions on file for this visit.   Kriste Basque R.  Addendum: received some records including echo and stress test from earlier this year showing preserved EF and some enlargement of heart - poor valvular study.

## 2012-06-30 ENCOUNTER — Other Ambulatory Visit: Payer: Self-pay

## 2012-06-30 DIAGNOSIS — E119 Type 2 diabetes mellitus without complications: Secondary | ICD-10-CM

## 2012-06-30 MED ORDER — FREESTYLE LANCETS MISC: Status: DC

## 2012-06-30 MED ORDER — GLUCOSE BLOOD VI STRP: ORAL_STRIP | Status: DC

## 2012-07-02 ENCOUNTER — Encounter: Payer: Self-pay | Admitting: Dietician

## 2012-07-12 ENCOUNTER — Telehealth: Payer: Self-pay

## 2012-07-12 NOTE — Telephone Encounter (Signed)
Pt returned call and is aware of Dr. Elmyra Ricks recommendations.  Pt states he understands.

## 2012-07-12 NOTE — Telephone Encounter (Signed)
Per Dr. Selena Batten called pt to make aware that we see he has gone to see the neurologist and we are happy that he made this appt.  Dr. Selena Batten has read the notes and sees that pt's insurance denied psych.  Dr. Selena Batten still advises that pt is seen by psych and a referral is not needed for this but sometimes health insurance will not pay for this and the patient has to pay out of pocket (payment plan).  After being seen and having medication recommendations, Dr. Selena Batten may be willing to follow pt.    No voicemail set up at this time.

## 2012-08-04 ENCOUNTER — Telehealth: Payer: Self-pay | Admitting: Family Medicine

## 2012-08-04 ENCOUNTER — Encounter: Payer: Self-pay | Admitting: Family Medicine

## 2012-08-04 MED ORDER — CITALOPRAM HYDROBROMIDE 20 MG PO TABS: 20.0000 mg | ORAL_TABLET | Freq: Every day | ORAL | Status: DC

## 2012-08-04 NOTE — Telephone Encounter (Signed)
-  Please make sure pharmacy has 90 day supply with refill of his celexa. Sent Rx for 20mg  daily, #90, 3 refills.  -please tell him he is to continue to titrate lantus to goal per written instructions last visit - if he gets to 50 units or having low blood sugars let us know  -got notes from neuro from December appt, will defer to them on anything found on MRI, do not have that report

## 2012-08-04 NOTE — Telephone Encounter (Signed)
Pls advise.  

## 2012-08-04 NOTE — Telephone Encounter (Signed)
Sent patient an email due to phone being off.  Pt is currently working.

## 2012-08-25 ENCOUNTER — Ambulatory Visit (INDEPENDENT_AMBULATORY_CARE_PROVIDER_SITE_OTHER): Payer: Managed Care, Other (non HMO) | Admitting: Family Medicine

## 2012-08-25 ENCOUNTER — Telehealth: Payer: Self-pay | Admitting: Family Medicine

## 2012-08-25 ENCOUNTER — Encounter: Payer: Self-pay | Admitting: Family Medicine

## 2012-08-25 VITALS — BP 110/76 | HR 88 | Temp 97.8°F | Wt 291.0 lb

## 2012-08-25 DIAGNOSIS — E1165 Type 2 diabetes mellitus with hyperglycemia: Secondary | ICD-10-CM | POA: Insufficient documentation

## 2012-08-25 DIAGNOSIS — G43909 Migraine, unspecified, not intractable, without status migrainosus: Secondary | ICD-10-CM | POA: Insufficient documentation

## 2012-08-25 DIAGNOSIS — E785 Hyperlipidemia, unspecified: Secondary | ICD-10-CM | POA: Insufficient documentation

## 2012-08-25 DIAGNOSIS — E119 Type 2 diabetes mellitus without complications: Secondary | ICD-10-CM

## 2012-08-25 HISTORY — DX: Hyperlipidemia, unspecified: E78.5

## 2012-08-25 LAB — LIPID PANEL: Cholesterol: 161 mg/dL (ref 0–200)

## 2012-08-25 LAB — HEMOGLOBIN A1C: Hgb A1c MFr Bld: 8.8 % — ABNORMAL HIGH (ref 4.6–6.5)

## 2012-08-25 LAB — BASIC METABOLIC PANEL
CO2: 27 mEq/L (ref 19–32)
Chloride: 107 mEq/L (ref 96–112)
Sodium: 140 mEq/L (ref 135–145)

## 2012-08-25 MED ORDER — PRAVASTATIN SODIUM 40 MG PO TABS: 60.0000 mg | ORAL_TABLET | Freq: Every day | ORAL | Status: DC

## 2012-08-25 MED ORDER — METFORMIN HCL 500 MG PO TABS: 500.0000 mg | ORAL_TABLET | Freq: Two times a day (BID) | ORAL | Status: DC

## 2012-08-25 MED ORDER — GLUCOSE BLOOD VI STRP: ORAL_STRIP | Status: DC

## 2012-08-25 NOTE — Progress Notes (Signed)
Chief Complaint  Patient presents with  . 2 month follow up    HPI: Bryan Mcmillan  recently moved to the area and was found to have uncontrolled diabetes and elevated cholesterol on labs. He is here for follow up of these issues today.  DM: -lifestyle counseling and lantus is at 25 units per night - also on metformin 500mg  daily -Hgba1c 10.2 on 05/23/12 -denies: polyuria, polydipsia, neuropathy, foot woulds, vision changes -optho: sees eye doctor regularly -no regular exericise, diet poor - but sister dietician and he is working with her -Home BS: fasting is ave 125 - high of 135, postprandial is 140-150 (2 times per week may run 170 or 180)  Hyperlipidemia: -total 224, HDL 28, LDL 171 on 05/23/12 -started on pravachol last visit  Migraines/Hx venous thromboses in brain and seizure disorder: -followed by neurology -on keppra and lovenox (also with hx recurrent thromboses) -reviewed 12.2013  notes, topriamate added for migraines, MRI ordered and pt to follow up in 2-3 months - pt wants to know about MRI results -no bleeding  Depression: On celexa Provided list of counselors and was supposed to see psych per his request Denies SI currently, did have episode of suicidal thoughts about 1 month ago -has appt on Jan 30th with psych for this    ROS: See pertinent positives and negatives per HPI.  Past Medical History  Diagnosis Date  . Asthma   . Depression   . Diabetes   . Headache     frequent  . Kidney disease   . Kidney stones   . Migraines   . History of blood clots   . Seizure   . Hyperlipidemia   . Obesity   . Sleep apnea   . DVT (deep venous thrombosis), hx of recurrent 05/23/2012  . History of blood clot in brain, 2012 - followed by [facility] 05/23/2012  . Seizure disorder - followed by [facility] 05/23/2012  . Hyperlipemia 08/25/2012    Family History  Problem Relation Age of Onset  . Hyperlipidemia      parent  . Heart disease      parent  .  Stroke      parent  . Diabetes      grandparent /parent  . Obesity Other   . Heart attack Other     History   Social History  . Marital Status: Single    Spouse Name: N/A    Number of Children: N/A  . Years of Education: N/A   Social History Main Topics  . Smoking status: Former Games developer  . Smokeless tobacco: None  . Alcohol Use: No  . Drug Use: None  . Sexually Active: None   Other Topics Concern  . 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 , Disp: , Rfl: ;  glucose blood (FREESTYLE LITE) test strip, Use as instructed, Disp: 100 each, Rfl: 12 insulin glargine (LANTUS SOLOSTAR) 100 UNIT/ML injection, 10 units daily to start. Titrate up by 2 units every 3 days to reach fasting blood sugar goal of 70-130, Disp: 5 pen, Rfl: PRN;  Insulin Pen Needle 32G X 4 MM MISC, Use as  directed., Disp: 100 each, Rfl: 5;  Lancets (FREESTYLE) lancets, Use as instructed, Disp: 100 each, Rfl: 12;  metFORMIN (GLUCOPHAGE) 500 MG tablet, Take 500 mg by mouth daily. , Disp: , Rfl:  pravastatin (PRAVACHOL) 40 MG tablet, Take 1 tablet (40 mg total) by mouth daily., Disp: 90 tablet, Rfl: 3;  aspirin-acetaminophen-caffeine (EXCEDRIN MIGRAINE) 250-250-65 MG per tablet, Take 1 tablet by mouth every 6 (six) hours as needed., Disp: , Rfl: ;  levETIRAcetam (KEPPRA) 500 MG tablet, Take 500 mg by mouth 2 (two) times daily. , Disp: , Rfl: ;  topiramate (TOPAMAX) 50 MG tablet, , Disp: , Rfl:   EXAM:  Filed Vitals:   08/25/12 0802  BP: 110/76  Pulse: 88  Temp: 97.8 F (36.6 C)    There is no height on file to calculate BMI.  GENERAL: vitals reviewed  and listed above, alert, oriented, appears well hydrated and in no acute distress Stuttering speech HEENT: atraumatic, conjunttiva clear, no obvious abnormalities on inspection of external nose and ears, sl left sided droop of mouth  NECK: no obvious masses on inspection  LUNGS: clear to auscultation bilaterally, no wheezes, rales or rhonchi, good air movement  CV: HRRR, no peripheral edema  MS: moves all extremities without noticeable abnormality  PSYCH: pleasant and cooperative, no obvious depression or anxiety  Diabetic foot exam: see appropriate section in chart - good pulses and skin health, normal monofilament, s/p amp of Lat 3 toes of L foot following lawn mower accident  ASSESSMENT AND PLAN:  Discussed the following assessment and plan:  1. Hyperlipemia  Lipid Panel, Basic metabolic panel  2. Migraine -followed by [facility]    3. DM (diabetes mellitus), type 2, uncontrolled  Hemoglobin A1c  4. Diabetes mellitus  Basic metabolic panel   FASTING LABS TODAY -emphasized importance of healthy diet and regular exercise -will contact him if need to change medications after labs result -pt has follow up with psych and is going to schedule follow up with neuro to discuss his MRI results  -Patient advised to return or notify a doctor immediately if symptoms worsen or persist or new concerns arise.  Patient Instructions  -We have ordered labs or studies at this visit. It can take up to 1-2 weeks for results and processing. We will contact you with instructions IF your results are abnormal. Normal results will be released to your [facility]. If you have not heard from [place] or can not find your results in [facility] in 2 weeks please contact our office.   -pending lab results - will make recommendations regarding medications. Continue current treatments for now.  -INCREASE EXERCISE  -WORK ON HEALTHY DIET  -call your neurologist Dr. Joycelyn Schmid at [facility]  [phone]) for follow up and to review MRI findings  -keep your appointment with psychiatry and ask them to send notes to me - if any suicidal thoughts contact a doctor or call 911 immediately  -follow up with me in 3-4 months         Bryan Mcmillan R.

## 2012-08-25 NOTE — Patient Instructions (Addendum)
-  We have ordered labs or studies at this visit. It can take up to 1-2 weeks for results and processing. We will contact you with instructions IF your results are abnormal. Normal results will be released to your [facility]. If you have not heard from [place] or can not find your results in [facility] in 2 weeks please contact our office.   -pending lab results - will make recommendations regarding medications. Continue current treatments for now.  -INCREASE EXERCISE  -WORK ON HEALTHY DIET  -call your neurologist Dr. Joycelyn Schmid at [facility] [phone]) for follow up and to review MRI findings  -keep your appointment with psychiatry and ask them to send notes to me - if any suicidal thoughts contact a doctor or call 911 immediately  -follow up with me in 3-4 months

## 2012-08-25 NOTE — Telephone Encounter (Signed)
Called and spoke with pt and pt is aware of results.  Pt states he understands.

## 2012-08-25 NOTE — Telephone Encounter (Signed)
Please let patient know:  -Diabetes lab (HgbA1c) has improved from 10.2 to 8.8. yay! GOAL is less then 7 - so still have some work to do. ADVISE: - increase metformin to bid -continue lantus but take it in the morning -start exercising -continue to work on diet, try to avoid the feel big meals that cause sugar to go up and if he has desert should have a small portion size -let us know if ANY BS < 70 or if FASTING BS > 130 or if POSTPRANDIAL B > 180  -cholesterol has improved, but is still not at goal: -advised taking 1 and 1/2 tab of cholesterol medicine daily -diet and exercise per above   Will recheck at in 3 months

## 2012-08-29 ENCOUNTER — Encounter: Payer: Self-pay | Admitting: Family Medicine

## 2012-09-06 ENCOUNTER — Encounter: Payer: Self-pay | Admitting: Family Medicine

## 2012-09-06 ENCOUNTER — Ambulatory Visit (INDEPENDENT_AMBULATORY_CARE_PROVIDER_SITE_OTHER): Payer: Managed Care, Other (non HMO) | Admitting: Family Medicine

## 2012-09-06 VITALS — BP 118/76 | HR 92 | Temp 97.9°F | Wt 295.0 lb

## 2012-09-06 DIAGNOSIS — E1165 Type 2 diabetes mellitus with hyperglycemia: Secondary | ICD-10-CM

## 2012-09-06 DIAGNOSIS — Z86718 Personal history of other venous thrombosis and embolism: Secondary | ICD-10-CM

## 2012-09-06 DIAGNOSIS — R51 Headache: Secondary | ICD-10-CM

## 2012-09-06 DIAGNOSIS — I82409 Acute embolism and thrombosis of unspecified deep veins of unspecified lower extremity: Secondary | ICD-10-CM

## 2012-09-06 DIAGNOSIS — E119 Type 2 diabetes mellitus without complications: Secondary | ICD-10-CM

## 2012-09-06 MED ORDER — GLUCOSE BLOOD VI STRP: ORAL_STRIP | Status: DC

## 2012-09-06 NOTE — Progress Notes (Signed)
Chief Complaint  Patient presents with  . blood clot    HPI:  Acute visit for sensation in legs: -started after he had blood clots in his brain in 2012 -feeling is like someone grabbing him in his leg and has slowly progressed up his medial leg from ankle to thigh - was constant -stopped feeling this sensation in his leg 2 weeks ago and now he is worried about the clot moving  -having no symptoms the last few weeks, denies leg pain, cramping, swelling erythema  Headaches: -chronic, not improving -followed by neurology  Needs form completed for work  ROS: See pertinent positives and negatives per HPI.  Past Medical History  Diagnosis Date  . Asthma   . Depression   . Diabetes   . Headache     frequent  . Kidney disease   . Kidney stones   . Migraines   . History of blood clots   . Seizure   . Hyperlipidemia   . Obesity   . Sleep apnea   . DVT (deep venous thrombosis), hx of recurrent 05/23/2012  . History of blood clot in brain, 2012 - followed by [facility] 05/23/2012  . Seizure disorder - followed by [facility] 05/23/2012  . Hyperlipemia 08/25/2012    Family History  Problem Relation Age of Onset  . Hyperlipidemia      parent  . Heart disease      parent  . Stroke      parent  . Diabetes      grandparent /parent  . Obesity Other   . Heart attack Other     History   Social History  . Marital Status: Single    Spouse Name: N/A    Number of Children: N/A  . Years of Education: N/A   Social History Main Topics  . Smoking status: Former Games developer  . Smokeless tobacco: None  . Alcohol Use: No  . Drug Use: None  . Sexually Active: None   Other Topics Concern  . 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 , Disp: , Rfl: ;  glucose blood (FREESTYLE LITE) test strip, Use as instructed, Disp: 100 each, Rfl: 12;  glucose blood test strip, Use as instructed, Disp: 100 each, Rfl: 12 insulin glargine (LANTUS SOLOSTAR) 100 UNIT/ML injection, 10 units daily to start. Titrate up by 2 units every 3 days to reach fasting blood sugar goal of 70-130, Disp: 5 pen, Rfl: PRN;  Insulin Pen Needle 32G X 4 MM MISC, Use as directed., Disp: 100 each, Rfl: 5;  Lancets (FREESTYLE) lancets, Use as instructed, Disp: 100 each, Rfl: 12;  levETIRAcetam (KEPPRA) 500 MG tablet, Take 500 mg by mouth 2 (two) times daily. , Disp: , Rfl:  metFORMIN (GLUCOPHAGE) 500 MG tablet, Take 1 tablet (500 mg total) by mouth 2 (two) times daily with a meal., Disp: 180 tablet, Rfl: 3;  pravastatin (PRAVACHOL) 40 MG tablet, Take 1.5 tablets (60 mg total) by mouth daily., Disp: 135 tablet, Rfl: 3;  topiramate (TOPAMAX) 50 MG tablet, , Disp: , Rfl: ;  Vilazodone HCl (VIIBRYD) 40 MG TABS, Take 40 mg  by mouth daily., Disp: , Rfl:  citalopram (CELEXA) 20 MG tablet, Take 1 tablet (20 mg total) by mouth daily., Disp: 90 tablet, Rfl: 3  EXAM:  Filed Vitals:   09/06/12 0913  BP: 118/76  Pulse: 92  Temp: 97.9 F (36.6 C)    There is no height on file to calculate BMI.  GENERAL: vitals reviewed and listed above, alert, oriented, appears well hydrated and in no acute distress  HEENT: atraumatic, conjunttiva clear, no obvious abnormalities on inspection of external nose and ears  NECK: no obvious masses on inspection  LUNGS: clear to auscultation bilaterally, no wheezes, rales or rhonchi, good air movement  CV: HRRR, no peripheral edema, no TTP over veins in leg, no erythema or swelling of legs  MS: moves all extremities without noticeable abnormality -skin is normal in legs, normal  pedal pulses  PSYCH: pleasant and cooperative, no obvious depression or anxiety  ASSESSMENT AND PLAN:  Discussed the following assessment and plan:  1. Diabetes mellitus  glucose blood (FREESTYLE LITE) test strip  2. DM (diabetes mellitus), type 2, uncontrolled  glucose blood (FREESTYLE LITE) test strip  3. Chronic headaches    4. History of blood clot in brain, 2012 - followed by [facility]    5. DVT (deep venous thrombosis), hx of recurrent     -Advised I do not know what the sensation in his legs was - query neuropathy, symptoms now completely resolved for 2 weeks and no abnormal findings on exam to suggest blood clot -offered referral to vascular for evaluation for claudication though feel unlikely, but he will wait on this -form completed for work -pt will follow up with his neurologist for his chronic headaches -I will see him in 3 months for his DM, HLD,  -Patient advised to return or notify a doctor immediately if symptoms worsen or persist or new concerns arise.  Patient Instructions       Terressa Koyanagi.

## 2012-09-07 ENCOUNTER — Other Ambulatory Visit: Payer: Self-pay

## 2012-09-07 ENCOUNTER — Other Ambulatory Visit: Payer: Self-pay | Admitting: Family Medicine

## 2012-09-07 MED ORDER — LEVETIRACETAM 500 MG PO TABS: 500.0000 mg | ORAL_TABLET | Freq: Two times a day (BID) | ORAL | Status: DC

## 2012-09-07 NOTE — Telephone Encounter (Signed)
Insurance is requiring 90 day supply on this per pharm. levETIRAcetam (KEPPRA) 500 MG tablet New 90 day order.

## 2012-09-07 NOTE — Telephone Encounter (Signed)
Rx for Keppra sent to pharmacy.

## 2012-09-07 NOTE — Telephone Encounter (Signed)
Rx sent to pharmacy   

## 2012-09-11 ENCOUNTER — Telehealth: Payer: Self-pay | Admitting: Family Medicine

## 2012-09-11 ENCOUNTER — Encounter: Payer: Self-pay | Admitting: Family Medicine

## 2012-09-11 NOTE — Telephone Encounter (Signed)
Bryan Mcmillan, Can you please contact his neurologist and help get him an appointment for his headaches. I do think it is best for him to see his neurologist about these symptoms. Also, please let him know if having symptoms like those described in his email - unable to speak, confusion, etc needs to go to ED.   Email from patient: Per our discussion, I have done what I can to follow up with Dr. Marjory Lies. I twice on Thursday and Friday each and I've tried calling twice today. The dizziness, headaches, and nausea are not getting better. On Sunday, I couldn't speak, was confused and at one point couldn't speak for about ten minutes. I rested for about 3 hours and was okay. Please advise.

## 2012-09-11 NOTE — Telephone Encounter (Signed)
Pls advise.  

## 2012-09-12 NOTE — Telephone Encounter (Signed)
Called [facility] and spoke with Diane who states that pt has an upcoming appt on 09/13/2012.

## 2012-09-12 NOTE — Telephone Encounter (Signed)
Attempted to call pt's cell phone- vm has not been set up.  Also called pt's POA and left a message stating that pt should go to the ER if symptoms worsen or pt has new symptoms before his appt with Dr. Danae Orleans on 09/13/2012.

## 2012-09-12 NOTE — Telephone Encounter (Signed)
MyChart message sent to patient.

## 2012-09-27 ENCOUNTER — Encounter: Payer: Self-pay | Admitting: Family Medicine

## 2012-09-29 ENCOUNTER — Telehealth: Payer: Self-pay

## 2012-09-29 NOTE — Telephone Encounter (Signed)
Called pt's mother(POA) to advise that pt's FMLA paperwork was ready for pick up and there was a 20 dollar charge. Pt's mother is aware.

## 2012-10-13 ENCOUNTER — Telehealth: Payer: Self-pay

## 2012-10-13 NOTE — Telephone Encounter (Addendum)
Pt called and stated that fax that was sent to [facility] recieved fax; frequency and duration is not enough to cover the time that pt missed. correct and inital frequeny and duration. Pt states it stated 6 hours and it should state 8 hours.    Alisha,   Changed duration to 8 for flares. Routine office visits do not take 8 hours. Please get to him. Thanks.  Dahlia Client

## 2012-10-17 NOTE — Telephone Encounter (Signed)
Faxed to [facility] on 10/16/12.

## 2012-11-08 ENCOUNTER — Encounter: Payer: Self-pay | Admitting: Diagnostic Neuroimaging

## 2012-11-08 ENCOUNTER — Ambulatory Visit (INDEPENDENT_AMBULATORY_CARE_PROVIDER_SITE_OTHER): Payer: Managed Care, Other (non HMO) | Admitting: Diagnostic Neuroimaging

## 2012-11-08 VITALS — BP 107/71 | HR 81 | Temp 98.8°F | Ht 70.5 in | Wt 295.0 lb

## 2012-11-08 DIAGNOSIS — C719 Malignant neoplasm of brain, unspecified: Secondary | ICD-10-CM

## 2012-11-08 MED ORDER — GABAPENTIN 300 MG PO CAPS: 300.0000 mg | ORAL_CAPSULE | Freq: Three times a day (TID) | ORAL | Status: DC

## 2012-11-08 NOTE — Patient Instructions (Signed)
Gradually increase gabapentin to 300mg  twice a day or three times per day.
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
## 2012-11-23 ENCOUNTER — Ambulatory Visit (INDEPENDENT_AMBULATORY_CARE_PROVIDER_SITE_OTHER): Payer: Managed Care, Other (non HMO) | Admitting: Family Medicine

## 2012-11-23 ENCOUNTER — Encounter: Payer: Self-pay | Admitting: Family Medicine

## 2012-11-23 VITALS — BP 100/70 | HR 100 | Temp 97.7°F | Wt 290.0 lb

## 2012-11-23 DIAGNOSIS — F3289 Other specified depressive episodes: Secondary | ICD-10-CM

## 2012-11-23 DIAGNOSIS — G40909 Epilepsy, unspecified, not intractable, without status epilepticus: Secondary | ICD-10-CM

## 2012-11-23 DIAGNOSIS — E1165 Type 2 diabetes mellitus with hyperglycemia: Secondary | ICD-10-CM

## 2012-11-23 DIAGNOSIS — G43909 Migraine, unspecified, not intractable, without status migrainosus: Secondary | ICD-10-CM

## 2012-11-23 DIAGNOSIS — I2699 Other pulmonary embolism without acute cor pulmonale: Secondary | ICD-10-CM

## 2012-11-23 DIAGNOSIS — E785 Hyperlipidemia, unspecified: Secondary | ICD-10-CM

## 2012-11-23 DIAGNOSIS — F329 Major depressive disorder, single episode, unspecified: Secondary | ICD-10-CM

## 2012-11-23 DIAGNOSIS — Z86718 Personal history of other venous thrombosis and embolism: Secondary | ICD-10-CM

## 2012-11-23 DIAGNOSIS — IMO0001 Reserved for inherently not codable concepts without codable children: Secondary | ICD-10-CM

## 2012-11-23 MED ORDER — METFORMIN HCL 1000 MG PO TABS: 1000.0000 mg | ORAL_TABLET | Freq: Two times a day (BID) | ORAL | Status: DC

## 2012-11-23 NOTE — Patient Instructions (Signed)
-  increase metformin to 1000mg  in the morning 500mg  for one week then increase to 1000mg  twice daily  -We placed a referral for you as discussed to the endocrinologist. It usually takes about 1-2 weeks to process and schedule this referral. If you have not heard from [place] regarding this appointment in 2 weeks please contact our office.  -follow up with me in 3-4 months

## 2012-11-23 NOTE — Progress Notes (Signed)
Chief Complaint  Patient presents with  . Follow-up    HPI: Bryan Mcmillan  recently moved to the area and was found to have uncontrolled diabetes and elevated cholesterol on labs. He is here for follow up today.  DM: -lifestyle counseling and lantus is at 25 units bid - per his change - also on metformin 500mg  bid -Home BS: reports home BS horrible - attributes this to stress at work - still eating some bad stuff - has made his own decisions regarding his meds and reports better -denies: polyuria, polydipsia, neuropathy, foot woulds, vision changes -optho: sees eye doctor regularly -no regular exericise, diet poor - but  Trying to work on this Lab Results  Component Value Date   HGBA1C 8.8* 08/25/2012   Hyperlipidemia: -started on pravachol  Lab Results  Component Value Date   CHOL 161 08/25/2012   HDL 32.40* 08/25/2012   LDLCALC 112* 08/25/2012   LDLDIRECT 171.6 05/23/2012   TRIG 81.0 08/25/2012   CHOLHDL 5 08/25/2012   Migraines/Hx venous thromboses in brain and seizure disorder: -followed by neurology -reviewed recent notes from neuro -on lovenox, neurontin - recently increased by neuro, keppra  Depression: On celexa, vilazadone, adderal Followed by psych - advised counseling - he will do this Denies SI currently  ROS: See pertinent positives and negatives per HPI.  Past Medical History  Diagnosis Date  . Asthma   . Depression   . Diabetes   . Headache     frequent  . Kidney disease   . Kidney stones   . Migraines   . History of blood clots   . Seizure   . Hyperlipidemia   . Obesity   . Sleep apnea   . DVT (deep venous thrombosis), hx of recurrent 05/23/2012  . History of blood clot in brain, 2012 - followed by [facility] 05/23/2012  . Seizure disorder - followed by [facility] 05/23/2012  . Hyperlipemia 08/25/2012  . Anxiety     Family History  Problem Relation Age of Onset  . Hyperlipidemia      parent  . Heart disease      parent  . Stroke       parent  . Diabetes      grandparent /parent  . Obesity Other   . Heart attack Other     History   Social History  . Marital Status: Single    Spouse Name: N/A    Number of Children: 1  . Years of Education: BA   Occupational History  . CUST SERV [employer]   Social History Main Topics  . Smoking status: Former Games developer  . Smokeless tobacco: None  . Alcohol Use: No  . Drug Use: No  . Sexually Active: None   Other Topics Concern  . None   Social History Narrative   Work or School: Back of [employer] Situation: lives with sister and mother      Spiritual Beliefs:      Lifestyle: trying to walk and working on diet      Caffeine Use: does consume             Current outpatient prescriptions:amphetamine-dextroamphetamine (ADDERALL XR) 10 MG 24 hr capsule, Take 1 capsule by mouth daily., Disp: , Rfl: ;  aspirin-acetaminophen-caffeine (EXCEDRIN MIGRAINE) 250-250-65 MG per tablet, Take 1 tablet by mouth every 6 (six) hours as needed., Disp: , Rfl: ;  citalopram (CELEXA) 20 MG tablet, Take 1 tablet (20 mg  total) by mouth daily., Disp: 90 tablet, Rfl: 3 enoxaparin (LOVENOX) 150 MG/ML injection, INJECT SUBCUTANEOUSLY TWICE DAILY, Disp: 60 Syringe, Rfl: 5;  gabapentin (NEURONTIN) 300 MG capsule, Take 1 capsule (300 mg total) by mouth 3 (three) times daily., Disp: 90 capsule, Rfl: 12;  glucose blood (FREESTYLE LITE) test strip, Use as instructed, Disp: 100 each, Rfl: 12;  glucose blood test strip, Use as instructed, Disp: 100 each, Rfl: 12 insulin glargine (LANTUS SOLOSTAR) 100 UNIT/ML injection, 10 units daily to start. Titrate up by 2 units every 3 days to reach fasting blood sugar goal of 70-130, Disp: 5 pen, Rfl: PRN;  Insulin Pen Needle 32G X 4 MM MISC, Use as directed., Disp: 100 each, Rfl: 5;  Lancets (FREESTYLE) lancets, Use as instructed, Disp: 100 each, Rfl: 12 levETIRAcetam (KEPPRA) 500 MG tablet, Take 1 tablet (500 mg total) by mouth 2 (two) times daily.,  Disp: 180 tablet, Rfl: 1;  metFORMIN (GLUCOPHAGE) 1000 MG tablet, Take 1 tablet (1,000 mg total) by mouth 2 (two) times daily with a meal., Disp: 180 tablet, Rfl: 3;  pravastatin (PRAVACHOL) 40 MG tablet, Take 1.5 tablets (60 mg total) by mouth daily., Disp: 135 tablet, Rfl: 3 Vilazodone HCl (VIIBRYD) 40 MG TABS, Take 40 mg by mouth daily., Disp: , Rfl:   EXAM:  Filed Vitals:   11/23/12 0812  BP: 100/70  Pulse: 100  Temp: 97.7 F (36.5 C)    Body mass index is 41.01 kg/(m^2).  GENERAL: vitals reviewed and listed above, alert, oriented, appears well hydrated and in no acute distress Stuttering speech HEENT: atraumatic, conjunttiva clear, no obvious abnormalities on inspection of external nose and ears, sl left sided droop of mouth  NECK: no obvious masses on inspection  LUNGS: clear to auscultation bilaterally, no wheezes, rales or rhonchi, good air movement  CV: HRRR, no peripheral edema  MS: moves all extremities without noticeable abnormality  PSYCH: pleasant and cooperative, no obvious depression or anxiety  Diabetic foot exam: see appropriate section in chart - good pulses and skin health, normal monofilament, s/p amp of Lat 3 toes of L foot following lawn mower accident  ASSESSMENT AND PLAN:  Discussed the following assessment and plan:  DM (diabetes mellitus), type 2, uncontrolled - Plan: metFORMIN (GLUCOPHAGE) 1000 MG tablet, Hemoglobin A1c, Basic metabolic panel, Microalbumin/Creatinine Ratio, Urine  Pulmonary embolism  History of blood clot in brain, 2012 - followed by [facility]  Depression  Hyperlipemia - Plan: Lipid Panel  Seizure disorder - followed by [facility]  Migraine -followed by [facility]  -He will follow up for FASTING LABS as is in a rush today -emphasized importance of healthy diet and regular exercise -increase metformin -he has had some difficulty following instructions in regards to his diabetes and remains uncontrolled and  often makes his on changes to his medications - advised him to see endocrinologist and referral placed -follow up with me in 3 months  -Patient advised to return or notify a doctor immediately if symptoms worsen or persist or new concerns arise.  Patient Instructions  -increase metformin to 1000mg  in the morning 500mg  for one week then increase to 1000mg  twice daily  -We placed a referral for you as discussed to the endocrinologist. It usually takes about 1-2 weeks to process and schedule this referral. If you have not heard from [place] regarding this appointment in 2 weeks please contact our office.  -follow up with me in 3-4 months     KIM, HANNAH R.

## 2012-11-24 ENCOUNTER — Telehealth: Payer: Self-pay | Admitting: Family Medicine

## 2012-11-24 DIAGNOSIS — E785 Hyperlipidemia, unspecified: Secondary | ICD-10-CM

## 2012-11-24 LAB — LIPID PANEL
Cholesterol: 167 mg/dL (ref 0–200)
HDL: 28.7 mg/dL — ABNORMAL LOW (ref >39.00)
LDL Cholesterol: 110 mg/dL — ABNORMAL HIGH (ref 0–99)
Triglycerides: 143 mg/dL (ref 0.0–149.0)
VLDL: 28.6 mg/dL (ref 0.0–40.0)

## 2012-11-24 LAB — BASIC METABOLIC PANEL
BUN: 11 mg/dL (ref 6–23)
Calcium: 9.2 mg/dL (ref 8.4–10.5)
Creatinine, Ser: 0.9 mg/dL (ref 0.4–1.5)
GFR: 101.25 mL/min (ref >60.00)

## 2012-11-24 LAB — MICROALBUMIN / CREATININE URINE RATIO: Microalb, Ur: 12.6 mg/dL — ABNORMAL HIGH (ref 0.0–1.9)

## 2012-11-24 MED ORDER — PRAVASTATIN SODIUM 80 MG PO TABS: 80.0000 mg | ORAL_TABLET | Freq: Every day | ORAL | Status: DC

## 2012-11-24 NOTE — Addendum Note (Signed)
Addended by: Bonnye Fava on: 11/24/2012 09:21 AM   Modules accepted: Orders

## 2012-11-24 NOTE — Telephone Encounter (Signed)
Please let him know: Unfortunately his diabetes is much worse then last time. HgbA1c 11.5, goal is <7. I do want to make sure he has increased the metfromin to 1000mg  in the morning and 500 in the evening? Next week I want him to increase the metformin to 1000mg  twice daily. It will be so important to east small, regular healthy, low carb meals and get daily exercise - 30 minutes of CV exercise daily. Also, PLEASE: -check BS fasting and postprandial (2 hours after a meal) daily and keep a log to bring to the appointment with the endocrinologist. The endocrinologist will help him with insulin dosing and he will like need mealtime insulin. -also, cholesterol is still higher then desired. Please increase to 80mg  daily. New rx sent to pharmacy.

## 2012-11-24 NOTE — Addendum Note (Signed)
Addended by: Terressa Koyanagi on: 11/24/2012 10:14 AM   Modules accepted: Orders

## 2012-11-27 NOTE — Telephone Encounter (Signed)
Called and spoke with pt's POA and she is aware of Dr. Elmyra Ricks recommendations.

## 2012-12-12 ENCOUNTER — Encounter: Payer: Self-pay | Admitting: Internal Medicine

## 2012-12-12 ENCOUNTER — Ambulatory Visit (INDEPENDENT_AMBULATORY_CARE_PROVIDER_SITE_OTHER): Payer: Managed Care, Other (non HMO) | Admitting: Internal Medicine

## 2012-12-12 VITALS — BP 112/78 | HR 96 | Temp 98.6°F | Resp 12 | Ht 71.0 in | Wt 286.0 lb

## 2012-12-12 DIAGNOSIS — E1165 Type 2 diabetes mellitus with hyperglycemia: Secondary | ICD-10-CM

## 2012-12-12 MED ORDER — INSULIN GLARGINE 100 UNIT/ML ~~LOC~~ SOLN: SUBCUTANEOUS | Status: DC

## 2012-12-12 MED ORDER — SITAGLIPTIN PHOSPHATE 100 MG PO TABS: 100.0000 mg | ORAL_TABLET | Freq: Every day | ORAL | Status: DC

## 2012-12-12 NOTE — Patient Instructions (Addendum)
Please return in 1 month with your sugar log.  Please increase Lantus to 40 units every night. Continue Metformin 1000 mg twice daily. Start Januvia 100 mg before breakfast.  Please consider the following ways to cut down carbs and fat and increase fiber and micronutrients in your diet:  - substitute whole grain for white bread or pasta - substitute brown rice for white rice - substitute 90-calorie flat bread pieces for slices of bread when possible - substitute sweet potatoes or yams for white potatoes - substitute humus for margarine - substitute tofu for cheese when possible - substitute almond or rice milk for regular milk (would not drink soy milk daily due to concern for soy estrogen influence on breast cancer risk) - substitute dark chocolate for other sweets when possible - substitute water - can add lemon or orange slices for taste - for diet sodas (artificial sweeteners will trick your body that you can eat sweets without getting calories and will lead you to overeating and weight gain in the long run) - do not skip breakfast or other meals (this will slow down the metabolism and will result in more weight gain over time)  - can try smoothies made from fruit and almond/rice milk in am instead of regular breakfast - can also try old-fashioned (not instant) oatmeal made with almond/rice milk in am - order the dressing on the side when eating salad at a restaurant (pour less than half of the dressing on the salad) - eat as little meat as possible - can try juicing, but should not forget that juicing will get rid of the fiber, so would alternate with eating raw veg./fruits or drinking smoothies - use as little oil as possible, even when using olive oil - can dress a salad with a mix of balsamic vinegar and lemon juice, for e.g. - use agave nectar, stevia sugar, or regular sugar rather than artificial sweateners - steam or broil/roast veggies  - snack on veggies/fruit/nuts (unsalted,  preferably) when possible, rather than processed foods - reduce or eliminate aspartame in diet (it is in diet sodas, chewing gum, etc) Read the labels!  Try to read Dr. Katherina Right book: "Program for Reversing Diabetes" for the vegan concept and other ideas for healthy eating.  Plant-based diet materials:  - Lectures (you tube):  Lequita Asal: "Breaking the Food Seduction"  Doug Lisle: "How to Lose Weight, without Losing Your Mind"  Lucile Crater: "What is Insulin Resistance" [employer]sonEntrepreneur.si - Documentaries:  Supersize Me  Food [employer]  Forks over [employer], Sick and Nearly Dead  The [employer] of the [employer] - Books:  Lequita Asal: "Program for Reversing Diabetes"  Ferol Luz: "The [place] Study"  Konrad Penta: "Supermarket Vegan" (cookbook) - Facebook pages:   Forks versus Knives  Vegucated  [employer]ys ''R[employer] Us Magazine  Food Matters - Healthy nutrition info websites:  LateTelevision.com.ee - Cinnamon issue:  EyeTint.com.ee  SolarInventors.es.aspx   PATIENT INSTRUCTIONS FOR TYPE 2 DIABETES:  DIET AND EXERCISE Diet and exercise is an important part of diabetic treatment.  We recommended aerobic exercise in the form of brisk walking (working between 40-60% of maximal aerobic capacity, similar to brisk walking) for 150 minutes per week (such as 30 minutes five days per week) along with 3 times per week performing 'resistance' training (using various gauge rubber tubes with handles) 5-10 exercises involving the major muscle groups (upper body, lower body and core) performing 10-15 repetitions (or near fatigue) each exercise. Start at half the above goal  but build slowly to reach the above goals. If limited by weight, joint pain, or disability, we recommend daily walking in a swimming pool with water up to waist to reduce pressure  from joints while allow for adequate exercise.    RECOMMENDATIONS TO REDUCE YOUR RISK OF DIABETIC COMPLICATIONS: * Take your prescribed MEDICATION(S). * Follow a DIABETIC diet: Complex carbs, fiber rich foods, heart healthy fish twice weekly, (monounsaturated and polyunsaturated) fats * AVOID saturated/trans fats, high fat foods, >2,300 mg salt per day. * EXERCISE at least 5 times a week for 30 minutes or preferably daily.  * DO NOT SMOKE OR DRINK more than 1 drink a day. * Check your FEET every day. Do not wear tightfitting shoes. Contact us if you develop an ulcer * See your EYE doctor once a year or more if needed * Get a FLU shot once a year * Get a PNEUMONIA vaccine once before and once after age 55 years  GOALS:  * Your Hemoglobin A1c of <7%  * Your Systolic BP should be 140 or lower  * Your Diastolic BP should be 80 or lower  * Your HDL (Good Cholesterol) should be 40 or higher  * Your LDL (Bad Cholesterol) should be 100 or lower  * Your Triglycerides should be 150 or lower  * Your Urine microalbumin (kidney function) should be <30 * Your Body Mass Index should be 25 or lower   We will be glad to help you achieve these goals. Our telephone number is: [phone].

## 2012-12-12 NOTE — Progress Notes (Signed)
Patient ID: Bryan Mcmillan, male   DOB: [birth year], 42 y.o.   MRN: [id number]  HPI: Bryan Mcmillan is a 42 y.o.-year-old male, referred by his PCP, Dr. Selena Batten, for management of DM2, non-insulin-dependent, uncontrolled, without complications. He moved to [place] recently.   Patient has been diagnosed with diabetes in 09/2010; he has started insulin the same year. Last hemoglobin A1c was: Lab Results  Component Value Date   HGBA1C 11.5* 11/24/2012  previously, in 08/2012, his hemoglobin A1c was 8.8%.  Pt is on a regimen of: - Metformin 1000 mg po bid (increased from 500 mg bid on 11/23/2012). No gi problems with it.  - Lantus 30 units hs, prev. on 25 units bid - but was having lows  Pt checks his sugars 4x a day and we reviewed his log together. His sugars improved a little in last 2 weeks, after increasing Metformin (he had some 300s before this change) - now they are: - am: 165-244 - 2h post brekfast: 207-269 - before dinner: 188-280 - 2h after dinner: 240s No lows. Lowest sugar was 89 in last 6 mo; he has hypoglycemia awareness at 100. Highest sugar was 400s.   Pt's meals - high protein  - Breakfast: protein shake - Lunch: meat + vegetable or fruit - Dinner: meat + vegetable - Snacks: oranges, apples, bananas  He is working on his diet. He was advised to cut out his carbs from his diet, and to increase the amount of protein. He is now eating as much protein as he can, including meat with every occasion he has. He tells me that he could do fine without eating so much meat, he actually likes seafood best. He lives with his mother, and she cooks fish or other seafood twice a week. 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 He is not exercising regularly, but is walking ~twice a week . He is using an over-the-counter medication used by body builders to bulk. He does not remember the name of the medicine but he was sent me a message  through my chart.   Pt does not have chronic kidney disease, last BUN/creatinine was:  Lab Results  Component Value Date   BUN 11 11/24/2012   CREATININE 0.9 11/24/2012  ACR was 4.9 at the same date.  Last set of lipids: Lab Results  Component Value Date   CHOL 167 11/24/2012   HDL 28.70* 11/24/2012   LDLCALC 110* 11/24/2012   LDLDIRECT 171.6 05/23/2012   TRIG 143.0 11/24/2012   CHOLHDL 6 11/24/2012   Pt's last eye exam was in June 2013. Sees ophthalmology regularly. No DR. Denies numbness and tingling in his legs.  I reviewed his chart and he also has a history of hyperlipidemia, depression/anxiety, history of PE, history of brain venous thrombosis in 2012, seizure disorder and migraines - followed by [facility] neurology; also obesity, history of kidney stones.   Pt has FH of DM in mother's side: mother, MGM.   ROS: Constitutional: + weight loss, + fatigue, + subjective hyperthermia, hot flushes, poor sleep, nocturia >1 Eyes: no blurry vision, no xerophthalmia ENT: no sore throat, no nodules palpated in throat, no dysphagia/odynophagia, no hoarseness; + tinnitus Cardiovascular: + CP/+ SOB/palpitations/leg swelling Respiratory: no cough/SOB Gastrointestinal: + N/no V/D/C Musculoskeletal: + muscle/+ joint aches Skin: no rashes, + itching Neurological: + tremors/no numbness/tingling/dizziness, + HAs, + seizures Psychiatric: + both depression/anxiety  Past Medical History  Diagnosis Date  .  Asthma   . Depression   . Diabetes   . Headache     frequent  . Kidney disease   . Kidney stones   . Migraines   . History of blood clots   . Seizure   . Hyperlipidemia   . Obesity   . Sleep apnea   . DVT (deep venous thrombosis), hx of recurrent 05/23/2012  . History of blood clot in brain, 2012 - followed by [facility] 05/23/2012  . Seizure disorder - followed by [facility] 05/23/2012  . Hyperlipemia 08/25/2012  . Anxiety    Past Surgical History  Procedure Laterality Date   . Brain surgery    . Filter for blood clots     History   Social History  . Marital Status: Single    Number of Children: 1, age 46  . Years of Education: BA   Occupational History  . CUST [employer]   Social History Main Topics  . Smoking status: Former Games developer, quit in 2006  . Alcohol Use: No  . Drug Use: No   Social History Narrative   Work or School: Back of [employer] Situation: lives with sister and mother      Spiritual Beliefs:      Lifestyle: trying to walk and working on diet      Caffeine Use: does consume   Current Outpatient Prescriptions on File Prior to Visit  Medication Sig Dispense Refill  . amphetamine-dextroamphetamine (ADDERALL XR) 10 MG 24 hr capsule Take 1 capsule by mouth daily.      Marland Kitchen aspirin-acetaminophen-caffeine (EXCEDRIN MIGRAINE) 250-250-65 MG per tablet Take 1 tablet by mouth every 6 (six) hours as needed.      . citalopram (CELEXA) 20 MG tablet Take 1 tablet (20 mg total) by mouth daily.  90 tablet  3  . enoxaparin (LOVENOX) 150 MG/ML injection INJECT SUBCUTANEOUSLY TWICE DAILY  60 Syringe  5  . gabapentin (NEURONTIN) 300 MG capsule Take 1 capsule (300 mg total) by mouth 3 (three) times daily.  90 capsule  12  . glucose blood (FREESTYLE LITE) test strip Use as instructed  100 each  12  . glucose blood test strip Use as instructed  100 each  12  . Insulin Pen Needle 32G X 4 MM MISC Use as directed.  100 each  5  . Lancets (FREESTYLE) lancets Use as instructed  100 each  12  . levETIRAcetam (KEPPRA) 500 MG tablet Take 1 tablet (500 mg total) by mouth 2 (two) times daily.  180 tablet  1  . metFORMIN (GLUCOPHAGE) 1000 MG tablet Take 1 tablet (1,000 mg total) by mouth 2 (two) times daily with a meal.  180 tablet  3  . pravastatin (PRAVACHOL) 80 MG tablet Take 1 tablet (80 mg total) by mouth daily.  90 tablet  3  . Vilazodone HCl (VIIBRYD) 40 MG TABS Take 40 mg by mouth daily.       No current facility-administered medications on  file prior to visit.   Allergies  Allergen Reactions  . Penicillins    Family History  Problem Relation Age of Onset  . Hyperlipidemia      parent  . Heart disease      parent  . Stroke      parent  . Diabetes      grandparent /parent  . Obesity Other   . Heart attack Other    PE: BP 112/78  Pulse 96  Temp(Src) 98.6 F (37 C) (Oral)  Resp 12  Ht 5\' 11"  (1.803 m)  Wt 286 lb (129.729 kg)  BMI 39.91 kg/m2  SpO2 97% Wt Readings from Last 3 Encounters:  12/12/12 286 lb (129.729 kg)  11/23/12 290 lb (131.543 kg)  11/08/12 295 lb (133.811 kg)  Constitutional: obese, in NAD Eyes: PERRLA, EOMI, no exophthalmos ENT: moist mucous membranes, no thyromegaly, no cervical lymphadenopathy Cardiovascular: RRR, No MRG Respiratory: CTA B Gastrointestinal: abdomen soft, NT, ND, BS+ Musculoskeletal: no deformities, strength intact in all 4 Skin: moist, warm, no rashes Neurological: no tremor with outstretched hands, DTR normal in all 4  ASSESSMENT: 1. DM2, insulin-dependent, uncontrolled, without complications  PLAN:  1. We had a long discussion about diet and ways to improve it. I first congratulated the patient for losing 9 pounds in the last month and a half. He did that by watching what he eats.  - We discussed about the fact that a high protein diet can also increase his sugars (by stimulating hepatic gluconeogenesis). I advised him that it is okay to eat some carbs maybe in the form of bread, rice, pasta, and especially fruit, but to try to cut down animal protein and fat. He is not bodybuilding anymore and he does not exercise a lot so he really does not need a lot of protein. I understand that eating protein can keep him full, but we can achieve that with a lot of fiber too. I advised him to eat as many vegetables as he can and he can also eat fruit for snacks. I gave him a list of healthy food substitutions, that will reduce the number of carbs that he is eating and would help  lower his sugars. I also gave him references about a plant-based diet, in which he appears interested. As specific goals, we discussed about the possibility of only eating meat twice a week, when he is having seafood. He will try this. - regarding his medication, since his sugars are invariably high in a.m., I suggested that we increase the Lantus from 30 to 40 units - we should continue metformin 1000 mg by mouth twice a day - with the incoming changes in his diet, and in the light of his recent weight loss, I would like to try to manage his sugars without rapid-acting insulin at this point, and we discussed about starting Januvia 100 mg daily - given foot care handout and explained the principles - given instructions for hypoglycemia management "15-15 rule" - I will see him back in a month with his sugar log. If his sugars are not better, will consider adding rapid acting insulin with his meals. We did discuss about this at today's visit, and is not opposed to starting it.

## 2013-01-05 ENCOUNTER — Ambulatory Visit: Payer: Managed Care, Other (non HMO) | Admitting: Internal Medicine

## 2013-01-05 DIAGNOSIS — Z0289 Encounter for other administrative examinations: Secondary | ICD-10-CM

## 2013-03-18 ENCOUNTER — Ambulatory Visit (HOSPITAL_COMMUNITY)
Admission: AD | Admit: 2013-03-18 | Discharge: 2013-03-18 | Disposition: A | Discharge status: Home / Self Care | Payer: Self-pay | Attending: Psychiatry | Admitting: Psychiatry

## 2013-03-18 ENCOUNTER — Encounter (HOSPITAL_COMMUNITY): Payer: Self-pay | Admitting: Emergency Medicine

## 2013-03-18 ENCOUNTER — Emergency Department (HOSPITAL_COMMUNITY)
Admission: EM | Admit: 2013-03-18 | Discharge: 2013-03-19 | Disposition: A | Discharge status: Other Acute Inpatient Hospital | Payer: No Typology Code available for payment source | Attending: Emergency Medicine | Admitting: Emergency Medicine

## 2013-03-18 DIAGNOSIS — Z9889 Other specified postprocedural states: Secondary | ICD-10-CM | POA: Insufficient documentation

## 2013-03-18 DIAGNOSIS — R443 Hallucinations, unspecified: Secondary | ICD-10-CM | POA: Insufficient documentation

## 2013-03-18 DIAGNOSIS — R45851 Suicidal ideations: Secondary | ICD-10-CM | POA: Insufficient documentation

## 2013-03-18 DIAGNOSIS — Z87442 Personal history of urinary calculi: Secondary | ICD-10-CM | POA: Insufficient documentation

## 2013-03-18 DIAGNOSIS — Z87448 Personal history of other diseases of urinary system: Secondary | ICD-10-CM | POA: Insufficient documentation

## 2013-03-18 DIAGNOSIS — E119 Type 2 diabetes mellitus without complications: Secondary | ICD-10-CM | POA: Insufficient documentation

## 2013-03-18 DIAGNOSIS — J45909 Unspecified asthma, uncomplicated: Secondary | ICD-10-CM | POA: Insufficient documentation

## 2013-03-18 DIAGNOSIS — G4730 Sleep apnea, unspecified: Secondary | ICD-10-CM | POA: Insufficient documentation

## 2013-03-18 DIAGNOSIS — F322 Major depressive disorder, single episode, severe without psychotic features: Secondary | ICD-10-CM | POA: Insufficient documentation

## 2013-03-18 DIAGNOSIS — G40909 Epilepsy, unspecified, not intractable, without status epilepticus: Secondary | ICD-10-CM | POA: Insufficient documentation

## 2013-03-18 DIAGNOSIS — F411 Generalized anxiety disorder: Secondary | ICD-10-CM | POA: Insufficient documentation

## 2013-03-18 DIAGNOSIS — Z79899 Other long term (current) drug therapy: Secondary | ICD-10-CM | POA: Insufficient documentation

## 2013-03-18 DIAGNOSIS — Z86718 Personal history of other venous thrombosis and embolism: Secondary | ICD-10-CM | POA: Insufficient documentation

## 2013-03-18 DIAGNOSIS — G43909 Migraine, unspecified, not intractable, without status migrainosus: Secondary | ICD-10-CM | POA: Insufficient documentation

## 2013-03-18 DIAGNOSIS — Z87891 Personal history of nicotine dependence: Secondary | ICD-10-CM | POA: Insufficient documentation

## 2013-03-18 DIAGNOSIS — Z794 Long term (current) use of insulin: Secondary | ICD-10-CM | POA: Insufficient documentation

## 2013-03-18 DIAGNOSIS — Z880 Allergy status to penicillin: Secondary | ICD-10-CM | POA: Insufficient documentation

## 2013-03-18 DIAGNOSIS — E669 Obesity, unspecified: Secondary | ICD-10-CM | POA: Insufficient documentation

## 2013-03-18 DIAGNOSIS — E785 Hyperlipidemia, unspecified: Secondary | ICD-10-CM | POA: Insufficient documentation

## 2013-03-18 LAB — CBC
MCH: 29 pg (ref 26.0–34.0)
MCV: 82.5 fL (ref 78.0–100.0)
Platelets: 344 10*3/uL (ref 150–400)
RBC: 5.21 MIL/uL (ref 4.22–5.81)
RDW: 13.1 % (ref 11.5–15.5)

## 2013-03-18 LAB — COMPREHENSIVE METABOLIC PANEL
AST: 45 U/L — ABNORMAL HIGH (ref 0–37)
CO2: 23 mEq/L (ref 19–32)
Calcium: 10.1 mg/dL (ref 8.4–10.5)
Creatinine, Ser: 0.58 mg/dL (ref 0.50–1.35)
GFR calc Af Amer: >90 mL/min (ref >90)
GFR calc non Af Amer: >90 mL/min (ref >90)

## 2013-03-18 LAB — RAPID URINE DRUG SCREEN, HOSP PERFORMED
Cocaine: NOT DETECTED
Opiates: NOT DETECTED
Tetrahydrocannabinol: NOT DETECTED

## 2013-03-18 MED ORDER — ENOXAPARIN SODIUM 150 MG/ML ~~LOC~~ SOLN: 150.0000 mg | Freq: Two times a day (BID) | SUBCUTANEOUS | Status: DC

## 2013-03-18 MED ORDER — INSULIN GLARGINE 100 UNIT/ML ~~LOC~~ SOLN
40.0000 [IU] | Freq: Every day | SUBCUTANEOUS | Status: DC
  Administered 2013-03-18: 40 [IU] via SUBCUTANEOUS
  Filled 2013-03-18 (×2): qty 0.4

## 2013-03-18 MED ORDER — METFORMIN HCL 500 MG PO TABS
1000.0000 mg | ORAL_TABLET | Freq: Two times a day (BID) | ORAL | Status: DC
  Administered 2013-03-19 (×2): 1000 mg via ORAL
  Filled 2013-03-18 (×3): qty 2

## 2013-03-18 MED ORDER — LINAGLIPTIN 5 MG PO TABS
5.0000 mg | ORAL_TABLET | Freq: Every day | ORAL | Status: DC
  Administered 2013-03-19: 5 mg via ORAL
  Filled 2013-03-18 (×2): qty 1

## 2013-03-18 MED ORDER — NICOTINE 21 MG/24HR TD PT24: 21.0000 mg | MEDICATED_PATCH | Freq: Every day | TRANSDERMAL | Status: DC

## 2013-03-18 MED ORDER — GABAPENTIN 300 MG PO CAPS
300.0000 mg | ORAL_CAPSULE | Freq: Three times a day (TID) | ORAL | Status: DC
  Administered 2013-03-18 – 2013-03-19 (×3): 300 mg via ORAL
  Filled 2013-03-18 (×4): qty 1

## 2013-03-18 MED ORDER — LEVETIRACETAM 500 MG PO TABS
500.0000 mg | ORAL_TABLET | Freq: Two times a day (BID) | ORAL | Status: DC
  Administered 2013-03-18 – 2013-03-19 (×2): 500 mg via ORAL
  Filled 2013-03-18 (×3): qty 1

## 2013-03-18 MED ORDER — ACETAMINOPHEN 325 MG PO TABS
650.0000 mg | ORAL_TABLET | ORAL | Status: DC | PRN
  Administered 2013-03-19: 650 mg via ORAL
  Filled 2013-03-18: qty 2

## 2013-03-18 MED ORDER — ENOXAPARIN SODIUM 60 MG/0.6ML ~~LOC~~ SOLN
60.0000 mg | Freq: Two times a day (BID) | SUBCUTANEOUS | Status: DC
  Administered 2013-03-18 – 2013-03-19 (×2): 60 mg via SUBCUTANEOUS
  Filled 2013-03-18 (×4): qty 0.6

## 2013-03-18 MED ORDER — VILAZODONE HCL 40 MG PO TABS
40.0000 mg | ORAL_TABLET | Freq: Every day | ORAL | Status: DC
  Administered 2013-03-18 – 2013-03-19 (×2): 40 mg via ORAL
  Filled 2013-03-18 (×2): qty 1

## 2013-03-18 MED ORDER — IBUPROFEN 200 MG PO TABS
600.0000 mg | ORAL_TABLET | Freq: Three times a day (TID) | ORAL | Status: DC | PRN
  Administered 2013-03-19: 600 mg via ORAL
  Filled 2013-03-18: qty 3

## 2013-03-18 MED ORDER — SIMVASTATIN 5 MG PO TABS
5.0000 mg | ORAL_TABLET | Freq: Every day | ORAL | Status: DC
  Administered 2013-03-19: 5 mg via ORAL
  Filled 2013-03-18: qty 1

## 2013-03-18 MED ORDER — ALUM & MAG HYDROXIDE-SIMETH 200-200-20 MG/5ML PO SUSP: 30.0000 mL | ORAL | Status: DC | PRN

## 2013-03-18 MED ORDER — ONDANSETRON HCL 4 MG PO TABS
4.0000 mg | ORAL_TABLET | Freq: Three times a day (TID) | ORAL | Status: DC | PRN
  Administered 2013-03-19 (×2): 4 mg via ORAL
  Filled 2013-03-18 (×2): qty 1

## 2013-03-18 MED ORDER — LORAZEPAM 1 MG PO TABS: 1.0000 mg | ORAL_TABLET | Freq: Three times a day (TID) | ORAL | Status: DC | PRN

## 2013-03-18 MED ORDER — ASPIRIN-ACETAMINOPHEN-CAFFEINE 250-250-65 MG PO TABS
1.0000 | ORAL_TABLET | Freq: Four times a day (QID) | ORAL | Status: DC | PRN
  Filled 2013-03-18 (×2): qty 1

## 2013-03-18 MED ORDER — CITALOPRAM HYDROBROMIDE 20 MG PO TABS: 20.0000 mg | ORAL_TABLET | Freq: Every day | ORAL | Status: DC

## 2013-03-18 MED ORDER — ZOLPIDEM TARTRATE 5 MG PO TABS: 5.0000 mg | ORAL_TABLET | Freq: Every evening | ORAL | Status: DC | PRN

## 2013-03-18 MED ORDER — AMPHETAMINE-DEXTROAMPHET ER 10 MG PO CP24
10.0000 mg | ORAL_CAPSULE | Freq: Every day | ORAL | Status: DC
  Administered 2013-03-18 – 2013-03-19 (×2): 10 mg via ORAL
  Filled 2013-03-18 (×2): qty 1

## 2013-03-18 NOTE — ED Provider Notes (Signed)
CSN: [id number]     Arrival date & time 03/18/13  1808 History     First MD Initiated Contact with Patient 03/18/13 1930     Chief Complaint  Patient presents with  . Medical Clearance   (Consider location/radiation/quality/duration/timing/severity/associated sxs/prior Treatment) The history is provided by the patient and medical records.   Patient with PMH significant for asthma, depression, diabetes, migraines, seizures, hyperlipidemia, and anxietypresents to the ED from [facility] for medical clearance. Patient states over the past several weeks he has felt "out of control" because of all the chaos and stress in his life, mostly financial.  States he is not usually a very organized or sequential type person, but he feels like everything in his life is "out of order". He also states he has had several anxiety attacks which seem to be making matters worse. Patient states he always has suicidal thoughts, states often when driving over a bridge he has all of driving his car off a bridge. He has never followed through on these thoughts or made an attempt to kill himself.  Patient also states over the past week he has started having auditory hallucinations which he has never heard before.  States he is not sure if this is his voice or some other voice. He states it tells him various things, but does not seem to be anything specific.  Denies any illicit drug or alcohol abuse. Denies any homicidal ideation or visual hallucinations.  Denies any chest pain, SOB, abdominal pain, N/V/D, fevers, sweats, or chills.  Past Medical History  Diagnosis Date  . Asthma   . Depression   . Diabetes   . Headache(784.0)     frequent  . Kidney disease   . Kidney stones   . Migraines   . History of blood clots   . Seizure   . Hyperlipidemia   . Obesity   . Sleep apnea   . DVT (deep venous thrombosis), hx of recurrent 05/23/2012  . History of blood clot in brain, 2012 - followed by [facility] 05/23/2012  .  Seizure disorder - followed by [facility] 05/23/2012  . Hyperlipemia 08/25/2012  . Anxiety    Past Surgical History  Procedure Laterality Date  . Brain surgery    . Filter for blood clots     Family History  Problem Relation Age of Onset  . Hyperlipidemia      parent  . Heart disease      parent  . Stroke      parent  . Diabetes      grandparent /parent  . Obesity Other   . Heart attack Other    History  Substance Use Topics  . Smoking status: Former Games developer  . Smokeless tobacco: Not on file  . Alcohol Use: No    Review of Systems  Psychiatric/Behavioral: Positive for suicidal ideas and hallucinations.  All other systems reviewed and are negative.    Allergies  Penicillins  Home Medications   Current Outpatient Rx  Name  Route  Sig  Dispense  Refill  . amphetamine-dextroamphetamine (ADDERALL XR) 10 MG 24 hr capsule   Oral   Take 1 capsule by mouth daily.         Marland Kitchen aspirin-acetaminophen-caffeine (EXCEDRIN MIGRAINE) 250-250-65 MG per tablet   Oral   Take 1 tablet by mouth every 6 (six) hours as needed.         . citalopram (CELEXA) 20 MG tablet   Oral   Take 1 tablet (20  mg total) by mouth daily.   90 tablet   3   . enoxaparin (LOVENOX) 150 MG/ML injection      INJECT SUBCUTANEOUSLY TWICE DAILY   60 Syringe   5   . gabapentin (NEURONTIN) 300 MG capsule   Oral   Take 1 capsule (300 mg total) by mouth 3 (three) times daily.   90 capsule   12   . glucose blood (FREESTYLE LITE) test strip      Use as instructed   100 each   12   . glucose blood test strip      Use as instructed   100 each   12     Dispense as written.   . insulin glargine (LANTUS) 100 UNIT/ML injection      Inject under skin 40 units every night.   5 pen   PRN   . Insulin Pen Needle 32G X 4 MM MISC      Use as directed.   100 each   5   . Lancets (FREESTYLE) lancets      Use as instructed   100 each   12   . levETIRAcetam (KEPPRA) 500 MG tablet    Oral   Take 1 tablet (500 mg total) by mouth 2 (two) times daily.   180 tablet   1   . metFORMIN (GLUCOPHAGE) 1000 MG tablet   Oral   Take 1 tablet (1,000 mg total) by mouth 2 (two) times daily with a meal.   180 tablet   3   . pravastatin (PRAVACHOL) 80 MG tablet   Oral   Take 1 tablet (80 mg total) by mouth daily.   90 tablet   3   . sitaGLIPtin (JANUVIA) 100 MG tablet   Oral   Take 1 tablet (100 mg total) by mouth daily. Before breakfast   30 tablet   3   . Vilazodone HCl (VIIBRYD) 40 MG TABS   Oral   Take 40 mg by mouth daily.          BP 136/77  Pulse 85  Temp(Src) 98.4 F (36.9 C) (Oral)  Resp 20  SpO2 95%  Physical Exam  Nursing note and vitals reviewed. Constitutional: He is oriented to person, place, and time. He appears well-developed and well-nourished. No distress.  HENT:  Head: Normocephalic and atraumatic.  Mouth/Throat: Oropharynx is clear and moist.  Eyes: Conjunctivae and EOM are normal. Pupils are equal, round, and reactive to light.  Neck: Normal range of motion. Neck supple.  Cardiovascular: Normal rate, regular rhythm and normal heart sounds.   Pulmonary/Chest: Effort normal and breath sounds normal. No respiratory distress. He has no wheezes.  Abdominal: Soft. Bowel sounds are normal. There is no tenderness. There is no guarding.  Musculoskeletal: Normal range of motion.  Neurological: He is alert and oriented to person, place, and time.  Skin: Skin is warm and dry. He is not diaphoretic.  Psychiatric: His speech is normal. He is slowed, withdrawn and actively hallucinating. He exhibits a depressed mood. He expresses suicidal ideation. He expresses no homicidal ideation. He expresses suicidal plans. He expresses no homicidal plans.  SI with plan, auditory hallucinations, denies HI or visual hallucinations    ED Course   Procedures (including critical care time)  Labs Reviewed  COMPREHENSIVE METABOLIC PANEL - Abnormal; Notable for the  following:    Sodium 133 (*)    Glucose, Bld 311 (*)    AST 45 (*)    ALT 77 (*)  All other components within normal limits  CBC  ETHANOL  URINE RAPID DRUG SCREEN (HOSP PERFORMED)   No results found. No diagnosis found.  MDM   Pt here from [facility], no beds available.  Labs as above.  Patient medically cleared and moved to the Psych ED. Temp orders and home meds placed. Psych consulted, they will see pt.  Garlon Hatchet, PA-C 03/18/13 2012

## 2013-03-18 NOTE — ED Notes (Signed)
Pt states he is hearing voices and has a feeling of Suicide thoughts all the time. He has told family he may drive off a bridge, pt feels like things are rushing.

## 2013-03-18 NOTE — ED Provider Notes (Signed)
Medical screening examination/treatment/procedure(s) were conducted as a shared visit with non-physician practitioner(s) and myself.  I personally evaluated the patient during the encounter.  Complains of anxiety and chaotic lifestyle.  Will get behavioral health consult  Donnetta Hutching, MD 03/18/13 2241

## 2013-03-18 NOTE — ED Notes (Signed)
Report called to Latricia RN pt going to room 38. Family has taken all belongings home with them.

## 2013-03-18 NOTE — ED Notes (Signed)
Pt alert, arrives from [facility] for medical clearance, no beds available, resp even unlabored, skin pwd

## 2013-03-18 NOTE — BH Assessment (Addendum)
Assessment Note  Bryan Mcmillan is an 42 y.o. male. Pt presents to [facility] reporting depression and SI as recently as yesterday.  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  Pt drove back to [facility] yesterday.  Pt reports he is very stressed due to this experience and due to be out of money and unable to meet his obligations, including his child support.  Pt reports he has had SI off and on for several years but reports that yesterday he was having thoughts of driving off a bridge while driving back to [place].  Pt is here with his mother, who reports that pt had brain surgery for blood clots in 2012 and has not done well since then.  He has had multiple medical issues with blood clots all over his body since then but has also struggled with depression.  Pt is on viibryd, among other medical meds, but reports he frequently forgets to take his medications.  Pt denies HI/AV.  Pt denies intent to harm self but does acknowledge persistent SI that has been worse over the past few days.  Mother is concerned about pt and reports that currently "he is just not functioning."  No substance abuse concerns reported.  Axis I: Major Depression, single episode Axis II: Deferred Axis III:  Past Medical History  Diagnosis Date  . Asthma   . Depression   . Diabetes   . Headache(784.0)     frequent  . Kidney disease   . Kidney stones   . Migraines   . History of blood clots   . Seizure   . Hyperlipidemia   . Obesity   . Sleep apnea   . DVT (deep venous thrombosis), hx of recurrent 05/23/2012  . History of blood clot in brain, 2012 - followed by [facility] 05/23/2012  . Seizure disorder - followed by [facility] 05/23/2012  . Hyperlipemia 08/25/2012  . Anxiety     Axis IV: economic problems and occupational problems Axis V: 31-40 impairment in reality testing  Past Medical History:  Past Medical History  Diagnosis Date  . Asthma   . Depression   . Diabetes   . Headache(784.0)     frequent  . Kidney disease   . Kidney stones   . Migraines   . History of blood clots   . Seizure   . Hyperlipidemia   . Obesity   . Sleep apnea   . DVT (deep venous thrombosis), hx of recurrent 05/23/2012  . History of blood clot in brain, 2012 - followed by [facility] 05/23/2012  . Seizure disorder - followed by [facility] 05/23/2012  . Hyperlipemia 08/25/2012  . Anxiety     Past Surgical History  Procedure Laterality Date  . Brain surgery    . Filter for blood clots      Family History:  Family History  Problem Relation Age of Onset  . Hyperlipidemia      parent  . Heart disease      parent  . Stroke      parent  . Diabetes      grandparent /parent  . Obesity Other   . Heart attack Other     Social  History:  reports that he has quit smoking. He does not have any smokeless tobacco history on file. He reports that he does not drink alcohol or use illicit drugs.  Additional Social History:  Alcohol / Drug Use History of alcohol / drug use?: No history of alcohol / drug abuse  CIWA:   COWS:    Allergies:  Allergies  Allergen Reactions  . Penicillins     Home Medications:  (Not in a hospital admission)  OB/GYN Status:  No LMP for male patient.  General Assessment Data Location of Assessment: [facility] ACT Assessment: Yes Is this a Tele or Face-to-Face Assessment?: Face-to-Face Is this an Initial Assessment or a Re-assessment for this encounter?: Initial Assessment Living Arrangements: Parent Can pt return to current living arrangement?: Yes Referral Source: Self/Family/Friend     [facility] Crisis Care Plan Living Arrangements: Parent Name of Psychiatrist: none Name of Therapist: none     Risk to  self Suicidal Ideation: Yes-Currently Present Suicidal Intent: No Is patient at risk for suicide?: Yes Suicidal Plan?: Yes-Currently Present Specify Current Suicidal Plan: drive car off bridge Access to Means: Yes Specify Access to Suicidal Means: has a car What has been your use of drugs/alcohol within the last 12 months?: denies use Previous Attempts/Gestures: No Intentional Self Injurious Behavior: None Family Suicide History: No Recent stressful life event(s): Financial Problems;Other (Comment) (recent job assignment in [place]) Persecutory voices/beliefs?: No Depression: Yes Depression Symptoms: Despondent;Insomnia;Tearfulness;Isolating;Fatigue;Guilt;Feeling worthless/self pity Substance abuse history and/or treatment for substance abuse?: No Suicide prevention information given to non-admitted patients: Not applicable  Risk to Others Homicidal Ideation: No Thoughts of Harm to Others: No Current Homicidal Intent: No Current Homicidal Plan: No Access to Homicidal Means: No History of harm to others?: No Assessment of Violence: None Noted Does patient have access to weapons?: No Criminal Charges Pending?: Yes Describe Pending Criminal Charges: traffic/parking in [place] Does patient have a court date: Yes Court Date:  (unknown)  Psychosis Hallucinations: None noted Delusions: None noted  Mental Status Report Appear/Hygiene: Disheveled Eye Contact: Fair Motor Activity: Unremarkable Speech: Logical/coherent Level of Consciousness: Quiet/awake Mood: Depressed Affect: Sad;Appropriate to circumstance Anxiety Level: None Thought Processes: Relevant Judgement: Unimpaired Orientation: Person;Place;Time;Situation Obsessive Compulsive Thoughts/Behaviors: None  Cognitive Functioning Concentration: Normal Memory: Recent Intact;Remote Intact IQ: Average Insight: Fair Impulse Control: Fair Appetite: Poor Weight Loss: 30 (in 2 months) Weight Gain: 0 Sleep:  Decreased Total Hours of Sleep: 4 Vegetative Symptoms: None  ADLScreening [facility] Assessment Services) Patient's cognitive ability adequate to safely complete daily activities?: Yes Patient able to express need for assistance with ADLs?: Yes Independently performs ADLs?: Yes (appropriate for developmental age)  Prior Inpatient Therapy Prior Inpatient Therapy: No  Prior Outpatient Therapy Prior Outpatient Therapy: Yes Prior Therapy Dates: early 2014 Prior Therapy Facilty/Provider(s): Dr Kizzie Bane Reason for Treatment: meds  ADL Screening (condition at time of admission) Patient's cognitive ability adequate to safely complete daily activities?: Yes Patient able to express need for assistance with ADLs?: Yes Independently performs ADLs?: Yes (appropriate for developmental age)       Abuse/Neglect Assessment (Assessment to be complete while patient is alone) Physical Abuse: Denies Verbal Abuse: Denies Sexual Abuse: Denies Exploitation of patient/patient's resources: Denies Self-Neglect: Denies     Merchant navy officer (For Healthcare) Advance Directive: Patient has advance directive, copy not in chart Type of Advance Directive: Healthcare Power of Attorney Advance Directive not in Chart: Copy requested from family    Additional Information 1:1 In Past 12 Months?: No CIRT Risk:  No Elopement Risk: No Does patient have medical clearance?: No     Disposition: Discussed with Inetta Fermo, AC.  Pt would benefit from inpt psych admit.  No beds available at [facility] currently.  Pt is agreeable to going to [facility] to hold for inpt psych bed.  Security from [employer] is currently at [facility] bringing another pt and agrees to transport this pt back to [employer].  Tim, Consulting civil engineer at [employer]. Disposition Initial Assessment Completed for this Encounter: Yes  On Site Evaluation by:   Reviewed with Physician:    Lorri Frederick 03/18/2013 5:36 PM

## 2013-03-18 NOTE — ED Notes (Signed)
Pt transferred from triage, presents with SI plan to drive off bridge.  Pt reports he had thoughts yesterday, denies at present. Pt reports he hears voices when experiencing high stress or anxiety.  Pt reports he never attempted SI in the past, feeling hopeless. Denies street drug or alcohol use.  Pt calm & cooperative.

## 2013-03-19 ENCOUNTER — Encounter (HOSPITAL_COMMUNITY): Payer: Self-pay | Admitting: *Deleted

## 2013-03-19 ENCOUNTER — Inpatient Hospital Stay (HOSPITAL_COMMUNITY)
Admission: AD | Admit: 2013-03-19 | Discharge: 2013-03-27 | DRG: 885 | Disposition: A | Discharge status: Home / Self Care | Payer: No Typology Code available for payment source | Source: Intra-hospital | Attending: Psychiatry | Admitting: Psychiatry

## 2013-03-19 DIAGNOSIS — F329 Major depressive disorder, single episode, unspecified: Secondary | ICD-10-CM

## 2013-03-19 DIAGNOSIS — E785 Hyperlipidemia, unspecified: Secondary | ICD-10-CM | POA: Diagnosis present

## 2013-03-19 DIAGNOSIS — E669 Obesity, unspecified: Secondary | ICD-10-CM | POA: Diagnosis present

## 2013-03-19 DIAGNOSIS — E1165 Type 2 diabetes mellitus with hyperglycemia: Secondary | ICD-10-CM

## 2013-03-19 DIAGNOSIS — F411 Generalized anxiety disorder: Secondary | ICD-10-CM | POA: Diagnosis present

## 2013-03-19 DIAGNOSIS — F332 Major depressive disorder, recurrent severe without psychotic features: Principal | ICD-10-CM | POA: Diagnosis present

## 2013-03-19 DIAGNOSIS — J45909 Unspecified asthma, uncomplicated: Secondary | ICD-10-CM | POA: Diagnosis present

## 2013-03-19 DIAGNOSIS — Z86718 Personal history of other venous thrombosis and embolism: Secondary | ICD-10-CM

## 2013-03-19 DIAGNOSIS — Z87891 Personal history of nicotine dependence: Secondary | ICD-10-CM

## 2013-03-19 DIAGNOSIS — E119 Type 2 diabetes mellitus without complications: Secondary | ICD-10-CM | POA: Diagnosis present

## 2013-03-19 DIAGNOSIS — F339 Major depressive disorder, recurrent, unspecified: Secondary | ICD-10-CM | POA: Diagnosis present

## 2013-03-19 HISTORY — DX: Respiratory tuberculosis unspecified: A15.9

## 2013-03-19 HISTORY — DX: Pneumonia, unspecified organism: J18.9

## 2013-03-19 LAB — GLUCOSE, CAPILLARY
Glucose-Capillary: 230 mg/dL — ABNORMAL HIGH (ref 70–99)
Glucose-Capillary: 236 mg/dL — ABNORMAL HIGH (ref 70–99)

## 2013-03-19 MED ORDER — METFORMIN HCL 500 MG PO TABS
1000.0000 mg | ORAL_TABLET | Freq: Two times a day (BID) | ORAL | Status: DC
Start: 2013-03-20 — End: 2013-03-27
  Administered 2013-03-20 – 2013-03-27 (×16): 1000 mg via ORAL
  Filled 2013-03-19 (×19): qty 2

## 2013-03-19 MED ORDER — LINAGLIPTIN 5 MG PO TABS
5.0000 mg | ORAL_TABLET | Freq: Every day | ORAL | Status: DC
  Administered 2013-03-20 – 2013-03-27 (×8): 5 mg via ORAL
  Filled 2013-03-19 (×10): qty 1

## 2013-03-19 MED ORDER — VILAZODONE HCL 40 MG PO TABS
40.0000 mg | ORAL_TABLET | Freq: Every day | ORAL | Status: DC
  Administered 2013-03-20 – 2013-03-27 (×8): 40 mg via ORAL
  Filled 2013-03-19 (×10): qty 1

## 2013-03-19 MED ORDER — AMPHETAMINE-DEXTROAMPHET ER 10 MG PO CP24
10.0000 mg | ORAL_CAPSULE | Freq: Every day | ORAL | Status: DC
  Administered 2013-03-20 – 2013-03-27 (×8): 10 mg via ORAL
  Filled 2013-03-19 (×8): qty 1

## 2013-03-19 MED ORDER — INSULIN ASPART 100 UNIT/ML ~~LOC~~ SOLN
0.0000 [IU] | Freq: Every day | SUBCUTANEOUS | Status: DC
  Administered 2013-03-19: 4 [IU] via SUBCUTANEOUS
  Administered 2013-03-20: 5 [IU] via SUBCUTANEOUS
  Administered 2013-03-21: 3 [IU] via SUBCUTANEOUS
  Administered 2013-03-22: 4 [IU] via SUBCUTANEOUS
  Administered 2013-03-23 – 2013-03-24 (×2): 3 [IU] via SUBCUTANEOUS
  Administered 2013-03-25 – 2013-03-26 (×2): 2 [IU] via SUBCUTANEOUS

## 2013-03-19 MED ORDER — LEVETIRACETAM 500 MG PO TABS
500.0000 mg | ORAL_TABLET | Freq: Two times a day (BID) | ORAL | Status: DC
  Administered 2013-03-20 – 2013-03-27 (×16): 500 mg via ORAL
  Filled 2013-03-19 (×19): qty 1

## 2013-03-19 MED ORDER — INSULIN ASPART 100 UNIT/ML ~~LOC~~ SOLN
0.0000 [IU] | Freq: Three times a day (TID) | SUBCUTANEOUS | Status: DC
  Administered 2013-03-20: 5 [IU] via SUBCUTANEOUS
  Administered 2013-03-20: 11 [IU] via SUBCUTANEOUS
  Administered 2013-03-20: 3 [IU] via SUBCUTANEOUS
  Administered 2013-03-21: 5 [IU] via SUBCUTANEOUS
  Administered 2013-03-21 (×2): 15 [IU] via SUBCUTANEOUS
  Administered 2013-03-22: 17:00:00 via SUBCUTANEOUS
  Administered 2013-03-22: 11 [IU] via SUBCUTANEOUS
  Administered 2013-03-22: 3 [IU] via SUBCUTANEOUS
  Administered 2013-03-23 (×3): 8 [IU] via SUBCUTANEOUS
  Administered 2013-03-24 (×2): 5 [IU] via SUBCUTANEOUS
  Administered 2013-03-24: 15 [IU] via SUBCUTANEOUS
  Administered 2013-03-25: 5 [IU] via SUBCUTANEOUS
  Administered 2013-03-25 (×2): 11 [IU] via SUBCUTANEOUS
  Administered 2013-03-26: 5 [IU] via SUBCUTANEOUS
  Administered 2013-03-26 – 2013-03-27 (×3): 3 [IU] via SUBCUTANEOUS
  Administered 2013-03-27: 5 [IU] via SUBCUTANEOUS
  Administered 2013-03-27: 8 [IU] via SUBCUTANEOUS

## 2013-03-19 MED ORDER — GABAPENTIN 300 MG PO CAPS
300.0000 mg | ORAL_CAPSULE | Freq: Three times a day (TID) | ORAL | Status: DC
  Administered 2013-03-20 – 2013-03-27 (×24): 300 mg via ORAL
  Filled 2013-03-19 (×28): qty 1

## 2013-03-19 MED ORDER — MAGNESIUM HYDROXIDE 400 MG/5ML PO SUSP: 30.0000 mL | Freq: Every day | ORAL | Status: DC | PRN

## 2013-03-19 MED ORDER — ACETAMINOPHEN 325 MG PO TABS
650.0000 mg | ORAL_TABLET | Freq: Four times a day (QID) | ORAL | Status: DC | PRN
  Administered 2013-03-20 – 2013-03-23 (×2): 650 mg via ORAL

## 2013-03-19 MED ORDER — TRAZODONE HCL 50 MG PO TABS
50.0000 mg | ORAL_TABLET | Freq: Every evening | ORAL | Status: DC | PRN
  Administered 2013-03-19 – 2013-03-25 (×6): 50 mg via ORAL
  Filled 2013-03-19 (×21): qty 1

## 2013-03-19 MED ORDER — SIMVASTATIN 40 MG PO TABS
40.0000 mg | ORAL_TABLET | Freq: Every day | ORAL | Status: DC
  Administered 2013-03-20 – 2013-03-27 (×8): 40 mg via ORAL
  Filled 2013-03-19 (×9): qty 1

## 2013-03-19 MED ORDER — INSULIN GLARGINE 100 UNIT/ML ~~LOC~~ SOLN
40.0000 [IU] | Freq: Every day | SUBCUTANEOUS | Status: DC
  Administered 2013-03-19 – 2013-03-26 (×8): 40 [IU] via SUBCUTANEOUS

## 2013-03-19 MED ORDER — ALUM & MAG HYDROXIDE-SIMETH 200-200-20 MG/5ML PO SUSP
30.0000 mL | ORAL | Status: DC | PRN
  Administered 2013-03-23 – 2013-03-25 (×2): 30 mL via ORAL

## 2013-03-19 NOTE — ED Provider Notes (Signed)
Pt accepted by [facility] to [facility]  Vida Roller, MD 03/19/13 1800

## 2013-03-19 NOTE — BH Assessment (Signed)
Maryjean Morn, PA accepted to [facility] pending bed availability. Per Laverle Hobby, [facility] at [facility], unit is at capacity.  Harlin Rain Ria Comment, [facility] Triage Specialist

## 2013-03-19 NOTE — BH Assessment (Signed)
Per Morrie Sheldon at [facility], they have beds. Writer faxed referral.  Evette Cristal, LCSWA Assessment Counselor

## 2013-03-19 NOTE — Progress Notes (Signed)
Patient Identification:  Bryan Mcmillan Date of Evaluation:  03/19/2013   History of Present Illness:   Pt presents to [facility] reporting depression and SI as recently as yesterday. 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 Pt drove back to [facility] yesterday. Pt reports he is very stressed due to this experience and due to be out of money and unable to meet his obligations, including his child support. Pt reports he has had SI off and on for several years but reports that yesterday he was having thoughts of driving off a bridge while driving back to [place].  Today patient states he is unable to contract for safety but also states he does not want to do anything" stupid" because he love his daughter.  He is in  agreement with this plan to be admitted for safety and stabilization.   Past Psychiatric History: none . Past Medical History:     Past Medical History  Diagnosis Date  . Asthma   . Depression   . Diabetes   . Headache(784.0)     frequent  . Kidney disease   . Kidney stones   . Migraines   . History of blood clots   . Seizure   . Hyperlipidemia   . Obesity   . Sleep apnea   . DVT (deep venous thrombosis), hx of recurrent 05/23/2012  . History of blood clot in brain, 2012 - followed by [facility] 05/23/2012  . Seizure disorder - followed by [facility] 05/23/2012  . Hyperlipemia 08/25/2012  . Anxiety        Past Surgical History  Procedure Laterality Date  . Brain surgery    . Filter for blood clots      Allergies:  Allergies  Allergen Reactions  . Penicillins     Unknown reaction    Current Medications:  Prior to Admission medications   Medication Sig Start Date End Date Taking? Authorizing Provider   amphetamine-dextroamphetamine (ADDERALL XR) 10 MG 24 hr capsule Take 1 capsule by mouth daily. 10/18/12  Yes Historical Provider, MD  aspirin-acetaminophen-caffeine (EXCEDRIN MIGRAINE) [phone] MG per tablet Take 1 tablet by mouth every 6 (six) hours as needed.   Yes Historical Provider, MD  enoxaparin (LOVENOX) 150 MG/ML injection INJECT SUBCUTANEOUSLY TWICE DAILY 09/07/12  Yes Terressa Koyanagi, DO  gabapentin (NEURONTIN) 300 MG capsule Take 1 capsule (300 mg total) by mouth 3 (three) times daily. 11/08/12  Yes Suanne Marker, MD  glucose blood (FREESTYLE LITE) test strip Use as instructed 09/06/12  Yes Terressa Koyanagi, DO  glucose blood test strip Use as instructed 08/25/12  Yes Terressa Koyanagi, DO  Insulin Pen Needle 32G X 4 MM MISC Use as directed. 06/01/12  Yes Terressa Koyanagi, DO  Lancets (FREESTYLE) lancets Use as instructed 06/30/12  Yes Terressa Koyanagi, DO  levETIRAcetam (KEPPRA) 500 MG tablet Take 1 tablet (500 mg total) by mouth 2 (two) times daily. 09/07/12  Yes Terressa Koyanagi, DO  metFORMIN (GLUCOPHAGE) 1000 MG tablet Take 1 tablet (1,000 mg total) by mouth 2 (two) times daily with a meal. 11/23/12  Yes Terressa Koyanagi, DO  pravastatin (PRAVACHOL) 80 MG tablet Take 1 tablet (80 mg  total) by mouth daily. 11/24/12  Yes Terressa Koyanagi, DO  sitaGLIPtin (JANUVIA) 100 MG tablet Take 1 tablet (100 mg total) by mouth daily. Before breakfast 12/12/12  Yes Carlus Pavlov, MD  Vilazodone HCl (VIIBRYD) 40 MG TABS Take 40 mg by mouth daily.   Yes Historical Provider, MD  insulin glargine (LANTUS) 100 UNIT/ML injection Inject under skin 40 units every night. 12/12/12   Carlus Pavlov, MD    Social History:    reports that he has quit smoking. He does not have any smokeless tobacco history on file. He reports that he does not drink alcohol or use illicit drugs.   Family History:    Family History  Problem Relation Age of Onset  . Hyperlipidemia      parent  . Heart disease      parent  . Stroke      parent   . Diabetes      grandparent /parent  . Obesity Other   . Heart attack Other     Mental Status Examination/Evaluation:  Male who is awake, alert and cooperative but reports his mood to be sad and depressed.  His affect is congruent with his mood.  His attention and concentration is normal, his thought process and content is significant for suicidal thoughts.  He denies HI/AVH.  He also states he finds it difficult to trust people.  His judgement and insight is shallow.   DIAGNOSIS:   AXIS I   Major depressive d/o, single episode  AXIS II  Deffered  AXIS III See medical notes.  AXIS IV occupational problems, other psychosocial or environmental problems and problems related to social environment  AXIS V 51-60 moderate symptoms     Assessment/Plan:  We will continue to wait for bed availability in our 500 hall unit We will continue to monitor him and provide  both his medical and psychiatric needs  I have personally seen the patient and agreed with the findings and involved in the treatment plan. Kathryne Sharper, MD

## 2013-03-20 ENCOUNTER — Encounter (HOSPITAL_COMMUNITY): Payer: Self-pay | Admitting: Psychiatry

## 2013-03-20 DIAGNOSIS — F332 Major depressive disorder, recurrent severe without psychotic features: Principal | ICD-10-CM

## 2013-03-20 DIAGNOSIS — F339 Major depressive disorder, recurrent, unspecified: Secondary | ICD-10-CM | POA: Diagnosis present

## 2013-03-20 LAB — GLUCOSE, CAPILLARY: Glucose-Capillary: 354 mg/dL — ABNORMAL HIGH (ref 70–99)

## 2013-03-20 MED ORDER — ENOXAPARIN SODIUM 150 MG/ML ~~LOC~~ SOLN: 150.0000 mg | Freq: Two times a day (BID) | SUBCUTANEOUS | Status: DC

## 2013-03-20 MED ORDER — ENOXAPARIN SODIUM 120 MG/0.8ML ~~LOC~~ SOLN
115.0000 mg | SUBCUTANEOUS | Status: AC
  Administered 2013-03-20 (×2): 115 mg via SUBCUTANEOUS
  Filled 2013-03-20 (×2): qty 0.8

## 2013-03-20 MED ORDER — ASPIRIN-ACETAMINOPHEN-CAFFEINE 250-250-65 MG PO TABS: 1.0000 | ORAL_TABLET | Freq: Four times a day (QID) | ORAL | Status: DC | PRN

## 2013-03-20 MED ORDER — HYDROXYZINE HCL 25 MG PO TABS
25.0000 mg | ORAL_TABLET | ORAL | Status: DC | PRN
  Administered 2013-03-20: 25 mg via ORAL
  Filled 2013-03-20: qty 1

## 2013-03-20 MED ORDER — ENOXAPARIN SODIUM 120 MG/0.8ML ~~LOC~~ SOLN
115.0000 mg | Freq: Two times a day (BID) | SUBCUTANEOUS | Status: DC
  Administered 2013-03-21 – 2013-03-25 (×10): 115 mg via SUBCUTANEOUS
  Administered 2013-03-26: 21:00:00 via SUBCUTANEOUS
  Administered 2013-03-26 – 2013-03-27 (×2): 115 mg via SUBCUTANEOUS
  Filled 2013-03-20 (×17): qty 0.8

## 2013-03-20 NOTE — Tx Team (Signed)
Initial Interdisciplinary Treatment Plan  PATIENT STRENGTHS: (choose at least two) Average or above average intelligence General fund of knowledge Religious Affiliation Supportive family/friends  PATIENT STRESSORS: Financial difficulties Traumatic event   PROBLEM LIST: Problem List/Patient Goals Date to be addressed Date deferred Reason deferred Estimated date of resolution  Suicide thoughts 03/20/13     Depression 03/20/13                                                DISCHARGE CRITERIA:  Ability to meet basic life and health needs Motivation to continue treatment in a less acute level of care Verbal commitment to aftercare and medication compliance  PRELIMINARY DISCHARGE PLAN: Attend aftercare/continuing care group Attend PHP/IOP Participate in family therapy Return to previous living arrangement  PATIENT/FAMIILY INVOLVEMENT: This treatment plan has been presented to and reviewed with the patient, Bryan Mcmillan, and/or family member.  The patient and family have been given the opportunity to ask questions and make suggestions.  Bryan Mcmillan [facility] 03/20/2013, 12:15 AM

## 2013-03-20 NOTE — BHH Group Notes (Signed)
[facility] LCSW Group Therapy  03/20/2013 4:31 PM  Type of Therapy:  Group Therapy  Participation Level:  Did Not Attend  Smart, Herbert Seta 03/20/2013, 4:31 PM

## 2013-03-20 NOTE — BHH Suicide Risk Assessment (Signed)
Suicide Risk Assessment  Admission Assessment     Nursing information obtained from:    Demographic factors:    Current Mental Status:    Loss Factors:    Historical Factors:    Risk Reduction Factors:     CLINICAL FACTORS:   Depression:   Anhedonia Hopelessness Impulsivity Severe  COGNITIVE FEATURES THAT CONTRIBUTE TO RISK:  Closed-mindedness Polarized thinking Thought constriction (tunnel vision)    SUICIDE RISK:   Moderate:  Frequent suicidal ideation with limited intensity, and duration, some specificity in terms of plans, no associated intent, good self-control, limited dysphoria/symptomatology, some risk factors present, and identifiable protective factors, including available and accessible social support.  PLAN OF CARE: Supportive approach/copng skills/relapse prevention                              CBT.optimize treatment with psychotropics  I certify that inpatient services furnished can reasonably be expected to improve the patient's condition.  Mirna Sutcliffe A 03/20/2013, 5:07 PM

## 2013-03-20 NOTE — Progress Notes (Signed)
Recreation Therapy Notes  Date: 08.19.2014 Time: 2:30pm Location: 300 Hall Dayroom  Group Topic: Animal Assisted Activities  Goal Area(s) Addresses:  Patient will interact appropriately with dog team.    Behavioral Response: Did not attend  Bryan Mcmillan, LRT/CTRS  Bryan Mcmillan 03/20/2013 4:34 PM

## 2013-03-20 NOTE — Progress Notes (Signed)
Adult Psychoeducational Group Note  Date:  03/20/2013 Time:  10:12 PM  Group Topic/Focus:  Goals Group:   The focus of this group is to help patients establish daily goals to achieve during treatment and discuss how the patient can incorporate goal setting into their daily lives to aide in recovery.  Participation Level:  Active  Participation Quality:  Appropriate  Affect:  Appropriate  Cognitive:  Appropriate  Insight: Appropriate  Engagement in Group:  Engaged  Modes of Intervention:  Discussion  Additional Comments:  Pt stated that he wants to get a handle on his social anxiety.  Terie Purser R 03/20/2013, 10:12 PM

## 2013-03-20 NOTE — BHH Counselor (Signed)
Adult Comprehensive Assessment  Patient ID: Bryan Mcmillan, male   DOB: 1970-09-03, 42 y.o.   MRN: [id number]  Information Source: Information source: Patient  Current Stressors:  Educational / Learning stressors: N/A Employment / Job issues: recently quit job because of depression, anxiety and SI Family Relationships: N/A Surveyor, quantity / Lack of resources (include bankruptcy): stressed about finances  Housing / Lack of housing: N/A Physical health (include injuries & life threatening diseases): brain surgery in Feb. 2012 Social relationships: N/A Substance abuse: N/A Bereavement / Loss: N/A  Living/Environment/Situation:  Living Arrangements: Parent Living conditions (as described by patient or guardian): Pt lives with mother in [place].  Pt states that it is a good environment most of the time.   How long has patient lived in current situation?: since 2012 What is atmosphere in current home: Supportive;Loving;Comfortable  Family History:  Marital status: Divorced Divorced, when?: 2006 What types of issues is patient dealing with in the relationship?: pt states that they went their seperate ways, remains friends Additional relationship information: N/A Does patient have children?: Yes How many children?: 1 How is patient's relationship with their children?: Pt states that he has a good relationship with 69 yr old daughter.    Childhood History:  By whom was/is the patient raised?: Mother;Mother/father and step-parent Additional childhood history information: Pt states that his childhood was loving but isolated.  Pt explains that parents divorced when pt was 2 yrs old and mother remarried when pt was 7 and he considers step dad as father.  Older sister had cerebal palsy and mom had to devote time to siblings due to their needs. Description of patient's relationship with caregiver when they were a child: Pt states that he got along well with parents growing up. Patient's  description of current relationship with people who raised him/her: Pt states that he still gets along well with parents today.   Does patient have siblings?: Yes Number of Siblings: 3 Description of patient's current relationship with siblings: Closest to brother.  Live with older sister who has cerebal palsy.   Did patient suffer any verbal/emotional/physical/sexual abuse as a child?: No Did patient suffer from severe childhood neglect?: No Has patient ever been sexually abused/assaulted/raped as an adolescent or adult?: No Was the patient ever a victim of a crime or a disaster?: No Witnessed domestic violence?: No Has patient been effected by domestic violence as an adult?: No  Education:  Highest grade of school patient has completed: bachelor's degree, 2 associates degree Currently a student?: No Learning disability?: No  Employment/Work Situation:   Employment situation: Unemployed Patient's job has been impacted by current illness: Yes Describe how patient's job has been impacted: Unable to keep job due to depressive symptoms and issues with brain surgery What is the longest time patient has a held a job?: 18 months Where was the patient employed at that time?: Production designer, theatre/television/film for [employer] Has patient ever been in the [employer]?: No Has patient ever served in Buyer, retail?: No  Financial Resources:   Surveyor, quantity resources: Support from parents / caregiver;No income Does patient have a representative payee or guardian?: No  Alcohol/Substance Abuse:   What has been your use of drugs/alcohol within the last 12 months?: Pt denies alcohol and drug use If attempted suicide, did drugs/alcohol play a role in this?: No Alcohol/Substance Abuse Treatment Hx: Denies past history If yes, describe treatment: N/A Has alcohol/substance abuse ever caused legal problems?: No  Social Support System:   Conservation officer, nature Support System: Good Describe Community  Support System: Pt states that his family,  friends and ex wife are supportive Type of faith/religion: Mormon How does patient's faith help to cope with current illness?: church attendance, prayer  Leisure/Recreation:   Leisure and Hobbies: pt states that he is very creative and has 30 story plots in his head and enjoys role playing games  Strengths/Needs:   What things does the patient do well?: pt states that he is good at counseling and being creative In what areas does patient struggle / problems for patient: Depression, anxiety and SI  Discharge Plan:   Does patient have access to transportation?: Yes Will patient be returning to same living situation after discharge?: Yes Currently receiving community mental health services: No If no, would patient like referral for services when discharged?: Yes (What county?) [facility] [place]) Does patient have financial barriers related to discharge medications?: No  Summary/Recommendations:     Patient is a 42 year old Caucasian Male with a diagnosis of Major Depressive Disorder.  Patient lives in [place] with family.  Pt states that he has been having depression, anxiety and SI and reports he's had these symptoms since having brain surgery in 2012.  Patient will benefit from crisis stabilization, medication evaluation, group therapy and psycho education in addition to case management for discharge planning.    Horton, Salome Arnt. 03/20/2013
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
## 2013-03-20 NOTE — Progress Notes (Signed)
Patient ID: Bryan Mcmillan, male   DOB: 1970/11/18, 42 y.o.   MRN: [id number] He has been up and down today. Has reported that he was anxious and had a headache prn medication given and was effective.   Several times today he spoke of being unable to control his life, feels that he can't do anything right. He spoke of how life was before his surgery and now  Nothing.

## 2013-03-20 NOTE — Progress Notes (Signed)
The focus of this group is to educate the patient on the purpose and policies of crisis stabilization and provide a format to answer questions about their admission.  The group details unit policies and expectations of patients while admitted. Patient came to group but was pulled out to talk with MD as this is his first morning here.

## 2013-03-20 NOTE — Progress Notes (Addendum)
ADMISSION NOTE: This is a 42 years old Caucasian male admitted to the unit for Major Depression, Single episode. He reported having stress related to his medical condition and financial difficulty. Patient stated that there is no balance in his life; that he had more bad things happening to him that good things. He endorsed having social anxiety. Medical history of brain surgery in 2012 related to blood clots, and also had his toes amputated due to lawn mower accident in 1991. He also has Type 2 diabetes and on insulin for that, he also endorsed history of seizure d/o; last seizure in Dec 2012. He reported that he takes Keppra 500 mg BID . Patient appeared sad and depressed during this assessment. He seemed calm and cooperative during this assessment. Also endorsed history of sleep apnea; but said his mother would bring in his CPAP sometimes today. Patient encouraged and supported. Admission CBG was 304. He received HS medications as ordered. Oriented to the unit and Q 15 minute check continues as ordered to maintain safety.

## 2013-03-20 NOTE — Progress Notes (Signed)
Recreation Therapy Notes  Date: 08.19.2014 Time: 3:00pm Location: [address] Dayroom  Group Topic: Communication, Team Building, Problem Solving  Goal Area(s) Addresses:  Patient will be able to recognize use of communication, team building and problem solving during course of group activity. Patient will verbalize qualities used to make decisions during group session.  Patient will verbalize ability to use skills to build healthy support system post d/c.   Behavioral Response: Did not attend  Iker Nuttall L Bryer Cozzolino, LRT/CTRS  Froylan Hobby L 03/20/2013 3:56 PM 

## 2013-03-21 DIAGNOSIS — F411 Generalized anxiety disorder: Secondary | ICD-10-CM

## 2013-03-21 LAB — GLUCOSE, CAPILLARY
Glucose-Capillary: 303 mg/dL — ABNORMAL HIGH (ref 70–99)
Glucose-Capillary: 235 mg/dL — ABNORMAL HIGH (ref 70–99)
Glucose-Capillary: 256 mg/dL — ABNORMAL HIGH (ref 70–99)
Glucose-Capillary: 288 mg/dL — ABNORMAL HIGH (ref 70–99)

## 2013-03-21 MED ORDER — ARIPIPRAZOLE 2 MG PO TABS
2.0000 mg | ORAL_TABLET | Freq: Every day | ORAL | Status: DC
  Administered 2013-03-21 – 2013-03-23 (×3): 2 mg via ORAL
  Filled 2013-03-21 (×5): qty 1

## 2013-03-21 NOTE — Progress Notes (Signed)
[facility] MD Progress Note  03/21/2013 5:30 PM Stephane Niemann  MRN:  [id number] Subjective:  Rajvir states that he is still feeling very depressed, overwhelmed. 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 Dealing with a lot of shame and guilt for not being able to provide for his daughter. Shared that his biological father Surveyor, mining) was never there for him. He got adopted by his stepfather for what he changed his last name. He has a sense of hopelessness. He does not like to be around people, feels very uncomfortable Diagnosis:  Anxiety Disorder NOS DSM5: Schizophrenia Disorders:   Obsessive-Compulsive Disorders:   Trauma-Stressor Disorders:   Substance/Addictive Disorders:   Depressive Disorders:  Major Depressive Disorder - Severe (296.23)    ADL's:  Intact  Sleep: Poor  Appetite:  Poor  Suicidal Ideation:  Plan:  denies Intent:  denies Means:  denies Homicidal Ideation:  Plan:  denies Intent:  denies Means:  denies AEB (as evidenced by):  Psychiatric Specialty Exam: Review of Systems  HENT: Negative.   Eyes: Negative.   Respiratory: Negative.   Cardiovascular: Negative.   Gastrointestinal: Negative.   Genitourinary: Negative.   Musculoskeletal: Negative.   Skin: Negative.   Neurological: Positive for weakness.  Endo/Heme/Allergies: Negative.   Psychiatric/Behavioral: Positive for depression. The patient is nervous/anxious and has insomnia.     Blood pressure 109/74, pulse 88, temperature 97.9 F (36.6 C), temperature source Oral, resp. rate 16, height 5' 9.75" (1.772 m), weight 114.76 kg (253 lb).Body mass index is 36.55 kg/(m^2).  General Appearance: Fairly Groomed  Patent attorney::  Minimal  Speech:  Clear and Coherent, Slow and not spontaneous  Volume:  Decreased  Mood:  Depressed  Affect:  Blunt  Thought Process:  Coherent and Goal Directed  Orientation:  Full (Time, Place, and Person)   Thought Content:  little spontaneous content  Suicidal Thoughts:  Thoughts of death  Homicidal Thoughts:  No  Memory:  Immediate;   Fair Recent;   Fair Remote;   Fair  Judgement:  Fair  Insight:  Present  Psychomotor Activity:  Psychomotor Retardation  Concentration:  Fair  Recall:  Fair  Akathisia:  No  Handed:  Right  AIMS (if indicated):     Assets:  Desire for Improvement  Sleep:  Number of Hours: 6.25   Current Medications: Current Facility-Administered Medications  Medication Dose Route Frequency Provider Last Rate Last Dose  . acetaminophen (TYLENOL) tablet 650 mg  650 mg Oral Q6H PRN Rachael Fee, MD   650 mg at 03/20/13 1111  . alum & mag hydroxide-simeth (MAALOX/MYLANTA) 200-200-20 MG/5ML suspension 30 mL  30 mL Oral Q4H PRN Rachael Fee, MD      . amphetamine-dextroamphetamine (ADDERALL XR) 24 hr capsule 10 mg  10 mg Oral Daily Rachael Fee, MD   10 mg at 03/21/13 4098  . ARIPiprazole (ABILIFY) tablet 2 mg  2 mg Oral Daily Rachael Fee, MD   2 mg at 03/21/13 1156  . aspirin-acetaminophen-caffeine (EXCEDRIN MIGRAINE) per tablet 1 tablet  1 tablet Oral Q6H PRN Sanjuana Kava, NP      . enoxaparin (LOVENOX) injection 115 mg  115 mg Subcutaneous Q12H Sanjuana Kava, NP   115 mg at 03/21/13 0816  . gabapentin (NEURONTIN) capsule 300 mg  300 mg Oral TID Rachael Fee, MD   300 mg at 03/21/13 1720  . hydrOXYzine (ATARAX/VISTARIL) tablet 25 mg  25 mg Oral Q4H PRN Sanjuana Kava, NP   25 mg at 03/20/13 1401  . insulin aspart (novoLOG) injection 0-15 Units  0-15 Units Subcutaneous TID WC Rachael Fee, MD   5 Units at 03/21/13 1718  . insulin aspart (novoLOG) injection 0-5 Units  0-5 Units Subcutaneous QHS Rachael Fee, MD   5 Units at 03/20/13 2232  . insulin glargine (LANTUS) injection 40 Units  40 Units Subcutaneous QHS Rachael Fee, MD   40 Units at 03/20/13 2233  . levETIRAcetam (KEPPRA) tablet 500 mg  500 mg Oral BID Rachael Fee, MD   500 mg at 03/21/13 1719  .  linagliptin (TRADJENTA) tablet 5 mg  5 mg Oral Daily Rachael Fee, MD   5 mg at 03/21/13 1610  . magnesium hydroxide (MILK OF MAGNESIA) suspension 30 mL  30 mL Oral Daily PRN Rachael Fee, MD      . metFORMIN (GLUCOPHAGE) tablet 1,000 mg  1,000 mg Oral BID WC Rachael Fee, MD   1,000 mg at 03/21/13 1719  . simvastatin (ZOCOR) tablet 40 mg  40 mg Oral q1800 Rachael Fee, MD   40 mg at 03/21/13 1719  . traZODone (DESYREL) tablet 50 mg  50 mg Oral QHS,MR X 1 Rachael Fee, MD   50 mg at 03/20/13 2232  . Vilazodone HCl (VIIBRYD) TABS 40 mg  40 mg Oral Daily Rachael Fee, MD   40 mg at 03/21/13 9604    Lab Results:  Results for orders placed during the hospital encounter of 03/19/13 (from the past 48 hour(s))  GLUCOSE, CAPILLARY     Status: Abnormal   Collection Time    03/19/13 10:27 PM      Result Value Range   Glucose-Capillary 304 (*) 70 - 99 mg/dL   Comment 1 Notify RN     Comment 2 Documented in Chart    GLUCOSE, CAPILLARY     Status: Abnormal   Collection Time    03/20/13  6:08 AM      Result Value Range   Glucose-Capillary 192 (*) 70 - 99 mg/dL  GLUCOSE, CAPILLARY     Status: Abnormal   Collection Time    03/20/13 11:54 AM      Result Value Range   Glucose-Capillary 327 (*) 70 - 99 mg/dL   Comment 1 Notify RN    GLUCOSE, CAPILLARY     Status: Abnormal   Collection Time    03/20/13  4:52 PM      Result Value Range   Glucose-Capillary 250 (*) 70 - 99 mg/dL   Comment 1 Notify RN    GLUCOSE, CAPILLARY     Status: Abnormal   Collection Time    03/20/13  7:48 PM      Result Value Range   Glucose-Capillary 303 (*) 70 - 99 mg/dL   Comment 1 Notify RN     Comment 2 Documented in Chart    GLUCOSE, CAPILLARY     Status: Abnormal   Collection Time    03/20/13  9:59 PM      Result Value Range   Glucose-Capillary 354 (*) 70 - 99 mg/dL   Comment 1 Documented in Chart     Comment 2 Notify RN    GLUCOSE, CAPILLARY     Status: Abnormal   Collection Time    03/21/13  6:51 AM       Result Value Range   Glucose-Capillary 235 (*) 70 -  99 mg/dL  GLUCOSE, CAPILLARY     Status: Abnormal   Collection Time    03/21/13 11:46 AM      Result Value Range   Glucose-Capillary 397 (*) 70 - 99 mg/dL  GLUCOSE, CAPILLARY     Status: Abnormal   Collection Time    03/21/13  3:54 PM      Result Value Range   Glucose-Capillary 256 (*) 70 - 99 mg/dL  GLUCOSE, CAPILLARY     Status: Abnormal   Collection Time    03/21/13  4:55 PM      Result Value Range   Glucose-Capillary 242 (*) 70 - 99 mg/dL   Comment 1 Notify RN      Physical Findings: AIMS: Facial and Oral Movements Muscles of Facial Expression: None, normal Lips and Perioral Area: None, normal Jaw: None, normal Tongue: None, normal,Extremity Movements Upper (arms, wrists, hands, fingers): None, normal Lower (legs, knees, ankles, toes): None, normal, Trunk Movements Neck, shoulders, hips: None, normal, Overall Severity Severity of abnormal movements (highest score from questions above): None, normal Incapacitation due to abnormal movements: None, normal, Dental Status Current problems with teeth and/or dentures?: No Does patient usually wear dentures?: No  CIWA:  CIWA-Ar Total: 0 COWS:     Treatment Plan Summary: Daily contact with patient to assess and evaluate symptoms and progress in treatment Medication management  Plan: Supportive approach/coping skills/relapse prevention           CBT/Mindfulness           Add Abilify 2 mg daily to augment the Viibryd  Medical Decision Making Problem Points:  Review of psycho-social stressors (1) Data Points:  Review of medication regiment & side effects (2) Review of new medications or change in dosage (2)  I certify that inpatient services furnished can reasonably be expected to improve the patient's condition.   Kristoffer Bala A 03/21/2013, 5:30 PM

## 2013-03-21 NOTE — Progress Notes (Signed)
D: Patient denies SI/HI/AVH. Patient rates hopelessness as 6,  depression as 6, and anxiety as 6.  Patient affect is depressed. Mood is depressed.  Pt states, "The doctor changed my meds to help with my anxiety and I feel they are kinda working.  I'm forcing myself to interact with people here and it's hard.  I'm frustrated and angry that I haven't succeeded in life.  I keep hearing voices telling my I won't succeed."  Patient did attend evening group. Patient visible on the milieu. No distress noted. A: Support and encouragement offered. Scheduled medications given to pt. Q 15 min checks continued for patient safety. R: Patient receptive. Patient remains safe on the unit.

## 2013-03-21 NOTE — [facility] Suicide Risk Assessment (Signed)
[facility] INPATIENT:  Family/Significant Other Suicide Prevention Education  Suicide Prevention Education:  Education Completed; Bryan Mcmillan - mother [phone]),  (name of family member/significant other) has been identified by the patient as the family member/significant other with whom the patient will be residing, and identified as the person(s) who will aid the patient in the event of a mental health crisis (suicidal ideations/suicide attempt).  With written consent from the patient, the family member/significant other has been provided the following suicide prevention education, prior to the and/or following the discharge of the patient.  The suicide prevention education provided includes the following:  Suicide risk factors  Suicide prevention and interventions  National Suicide Hotline telephone number  [facility] assessment telephone number  [facility] Emergency Assistance 911  [facility] and/or Residential Mobile Crisis Unit telephone number  Request made of family/significant other to:  Remove weapons (e.g., guns, rifles, knives), all items previously/currently identified as safety concern.    Remove drugs/medications (over-the-counter, prescriptions, illicit drugs), all items previously/currently identified as a safety concern.  The family member/significant other verbalizes understanding of the suicide prevention education information provided.  The family member/significant other agrees to remove the items of safety concern listed above.  Bryan Mcmillan 03/21/2013, 3:44 PM

## 2013-03-21 NOTE — Progress Notes (Signed)
D:Pt c/o mild headache and says that it is related to light hurting his eyes. Pt is going to have his mother bring shades to cover his glasses. Per MD ok to use shades or can use a cap if he is unable to obtain shades. Pt rates his depression a a 6 on 1-10 scale with 10 being the most depressed. Pt reports having voices that tell him negative things about himself.  A:Offered support encouragement and 15 minute checks.  R:Pt denies si and hi. Safety maintained on the unit.

## 2013-03-21 NOTE — Tx Team (Signed)
Interdisciplinary Treatment Plan Update (Adult)  Date: 03/21/2013  Time Reviewed:  9:45 AM  Progress in Treatment: Attending groups: Yes Participating in groups:  Yes Taking medication as prescribed:  Yes Tolerating medication:  Yes Family/Significant othe contact made: CSW assessing Patient understands diagnosis:  Yes Discussing patient identified problems/goals with staff:  Yes Medical problems stabilized or resolved:  Yes Denies suicidal/homicidal ideation: Yes Issues/concerns per patient self-inventory:  Yes Other:  New problem(s) identified: N/A  Discharge Plan or Barriers: CSW assessing for appropriate referrals.    Reason for Continuation of Hospitalization: Anxiety Depression Medication Stabilization  Comments: N/A  Estimated length of stay: 3-5 days  For review of initial/current patient goals, please see plan of care.  Attendees: Patient:     Family:     Physician:  Dr. Lugo 03/21/2013 10:18 AM   Nursing:   Donna Shimp, RN 03/21/2013 10:18 AM   Clinical Social Worker:  Adanna Zuckerman Horton, LCSWA 03/21/2013 10:18 AM   Other: Brittany Tyson, RN 03/21/2013 10:18 AM   Other:  Heather Smart, LCSWA 03/21/2013 10:18 AM   Other:  Aggie Nwoko, NP 03/21/2013 10:19 AM   Other:  Jennifer Clark, RN case manager 03/21/2013 10:19 AM   Other:    Other:    Other:    Other:    Other:    Other:     Scribe for Treatment Team:   Horton, Candace Ramus Nicole, 03/21/2013 , 10:18 AM  

## 2013-03-21 NOTE — [facility] Group Notes (Signed)
[facility] LCSW Aftercare Discharge Planning Group Note   03/21/2013 8:45 AM  Participation Quality:  Alert and Appropriate   Mood/Affect:  Appropriate, Flat and Depressed   Depression Rating:  6  Anxiety Rating:  7-8  Thoughts of Suicide:  Pt denies SI/HI  Will you contract for safety?   Yes  Current AVH:  Pt denies  Plan for Discharge/Comments:  Pt attended discharge planning group and actively participated in group.  CSW provided pt with today's workbook.  Pt states that he is tired today but okay.  Pt states that he is here for depression, anxiety and SI.  Pt states that he will return home in [place] and follow up at [facility] for medication management and therapy.  No further needs voiced by pt at this time.    Transportation Means: Pt has access to transportation  Supports: Pt states that family is supportive  Bryan Mcmillan, LCSWA 03/21/2013 9:41 AM

## 2013-03-21 NOTE — Progress Notes (Signed)
Pt reports feeling lightheaded. Took vitals and CBG. Pt is resting in his room. Safety maintained.

## 2013-03-21 NOTE — Progress Notes (Signed)
Adult Psychoeducational Group Note  Date:  03/21/2013 Time:  11:00AM Group Topic/Focus:  Personal Choices and Values:   The focus of this group is to help patients assess and explore the importance of values in their lives, how their values affect their decisions, how they express their values and what opposes their expression.  Participation Level:  Did Not Attend   Additional Comments: Pt. Didn't attend group.   Bing Plume D 03/21/2013, 11:41 AM

## 2013-03-21 NOTE — Progress Notes (Signed)
Writer observed patient sitting in the dayroom watching tv but no interaction with peers. Writer spoke with patient 1:1 and he reports that his day has been ok but he has made it a point to be up and in the dayroom as much as possible but it has been difficult because he has social anxiety. Writer praised patient for doing that and encouraged him to keep it up instead of staying alone in his room. Patient currently denies si/hi/a/v hallucinations. Writer informed patient of scheduled meds and he is in agreement to taking them. Safety maintained on unit, will continue to monitor with 15 min checks.

## 2013-03-21 NOTE — [facility] Group Notes (Signed)
[facility] LCSW Group Therapy  03/21/2013  1:15 PM   Type of Therapy:  Group Therapy  Participation Level:  Active  Participation Quality:  Appropriate and Attentive  Affect:  Appropriate, Flat and Depressed  Cognitive:  Alert and Appropriate  Insight:  Developing/Improving and Engaged  Engagement in Therapy:  Developing/Improving and Engaged  Modes of Intervention:  Clarification, Confrontation, Discussion, Education, Exploration, Limit-setting, Orientation, Problem-solving, Rapport Building, Dance movement psychotherapist, Socialization and Support  Summary of Progress/Problems: The topic for group today was emotional regulation.  This group focused on both positive and negative emotion identification and allowed group members to process ways to identify feelings, regulate negative emotions, and find healthy ways to manage internal/external emotions. Group members were asked to reflect on a time when their reaction to an emotion led to a negative outcome and explored how alternative responses using emotion regulation would have benefited them. Group members were also asked to discuss a time when emotion regulation was utilized when a negative emotion was experienced.  Pt shared that he deals with anxiety, such as being in this large group and the tight space earlier for group but copes with humor and taking the attention off of him.  Pt was than able to relate to a peer about taking care of oneself, to be a better parent to their child.  Pt actively participated and was engaged in group discussion.    Reyes Ivan, [place] 03/21/2013 2:36 PM

## 2013-03-21 NOTE — [facility] Group Notes (Signed)
Adult Psychoeducational Group Note  Date:  03/21/2013 Time:  9:07 PM  Group Topic/Focus:  Wrap-Up Group:   The focus of this group is to help patients review their daily goal of treatment and discuss progress on daily workbooks.  Participation Level:  Minimal  Participation Quality:  Appropriate  Affect:  Flat  Cognitive:  Appropriate  Insight: Improving  Engagement in Group:  Developing/Improving  Modes of Intervention:  Discussion, Exploration and Support  Additional Comments:  Pt stated that his goal for today was to force himself into social environments. Which pt reported went ok that he had some problems when going to the cafeteria earlier today and wanted to leave early but knew he couldn't. Stated he is tired of using the same coping mechanisms and is hoping to learn new ones. Pt reports that he had an "ok day."  [name] 03/21/2013, 9:07 PM

## 2013-03-22 LAB — GLUCOSE, CAPILLARY
Glucose-Capillary: 279 mg/dL — ABNORMAL HIGH (ref 70–99)
Glucose-Capillary: 324 mg/dL — ABNORMAL HIGH (ref 70–99)

## 2013-03-22 NOTE — Progress Notes (Signed)
Pt did not attend Karaoke. 

## 2013-03-22 NOTE — Progress Notes (Signed)
[facility] MD Progress Note  03/22/2013 5:33 PM Bryan Mcmillan  MRN:  [id number] Subjective:  Bryan Mcmillan states that he is not doing that well. Has the sense of hopelessness, helplessness. He is experiencing anxiety building up to panic attacks. He is admits he is distorting people's gestures, personalizing. His mother shard with him that she is using Celexa successfully. He did not tolerate Zoloft before. He has some physical complains like nausea.  Diagnosis:  Major Depression recurrent DSM5: Schizophrenia Disorders:   Obsessive-Compulsive Disorders:   Trauma-Stressor Disorders:   Substance/Addictive Disorders:   Depressive Disorders:  Major Depressive Disorder - Severe (296.23)  Axis I: Major Depression, Recurrent severe  ADL's:  Intact  Sleep: Poor  Appetite:  Fair  Suicidal Ideation:  Plan:  denies Intent:  denies Means:  denies Homicidal Ideation:  Plan:  denies Intent:  denies Means:  denies AEB (as evidenced by):  Psychiatric Specialty Exam: Review of Systems  Constitutional: Negative.   Eyes: Positive for photophobia.  Respiratory: Negative.   Cardiovascular: Negative.   Gastrointestinal: Negative.   Genitourinary: Negative.   Musculoskeletal: Negative.   Skin: Negative.   Neurological: Positive for headaches.  Endo/Heme/Allergies: Negative.   Psychiatric/Behavioral: Positive for depression and substance abuse.  nausea  Blood pressure 126/77, pulse 108, temperature 98.6 F (37 C), temperature source Oral, resp. rate 16, height 5' 9.75" (1.772 m), weight 114.76 kg (253 lb).Body mass index is 36.55 kg/(m^2).  General Appearance: Fairly Groomed  Patent attorney::  Minimal  Speech:  Clear and Coherent and Slow  Volume:  Decreased  Mood:  Anxious and Depressed  Affect:  Restricted  Thought Process:  Coherent and Goal Directed  Orientation:  Full (Time, Place, and Person)  Thought Content:  worries, concerns  Suicidal Thoughts:  Intermittent  Homicidal Thoughts:  No   Memory:  Immediate;   Fair Recent;   Fair Remote;   Fair  Judgement:  Fair  Insight:  Present and superficial  Psychomotor Activity:  Restlessness  Concentration:  Fair  Recall:  Fair  Akathisia:  No  Handed:  Right  AIMS (if indicated):     Assets:  Desire for Improvement  Sleep:  Number of Hours: 6.5   Current Medications: Current Facility-Administered Medications  Medication Dose Route Frequency Provider Last Rate Last Dose  . acetaminophen (TYLENOL) tablet 650 mg  650 mg Oral Q6H PRN Rachael Fee, MD   650 mg at 03/20/13 1111  . alum & mag hydroxide-simeth (MAALOX/MYLANTA) 200-200-20 MG/5ML suspension 30 mL  30 mL Oral Q4H PRN Rachael Fee, MD      . amphetamine-dextroamphetamine (ADDERALL XR) 24 hr capsule 10 mg  10 mg Oral Daily Rachael Fee, MD   10 mg at 03/22/13 0817  . ARIPiprazole (ABILIFY) tablet 2 mg  2 mg Oral Daily Rachael Fee, MD   2 mg at 03/22/13 0818  . aspirin-acetaminophen-caffeine (EXCEDRIN MIGRAINE) per tablet 1 tablet  1 tablet Oral Q6H PRN Sanjuana Kava, NP      . enoxaparin (LOVENOX) injection 115 mg  115 mg Subcutaneous Q12H Sanjuana Kava, NP   115 mg at 03/22/13 0818  . gabapentin (NEURONTIN) capsule 300 mg  300 mg Oral TID Rachael Fee, MD   300 mg at 03/22/13 1706  . hydrOXYzine (ATARAX/VISTARIL) tablet 25 mg  25 mg Oral Q4H PRN Sanjuana Kava, NP   25 mg at 03/20/13 1401  . insulin aspart (novoLOG) injection 0-15 Units  0-15 Units Subcutaneous TID WC Rachael Fee,  MD      . insulin aspart (novoLOG) injection 0-5 Units  0-5 Units Subcutaneous QHS Rachael Fee, MD   3 Units at 03/21/13 2137  . insulin glargine (LANTUS) injection 40 Units  40 Units Subcutaneous QHS Rachael Fee, MD   40 Units at 03/21/13 2138  . levETIRAcetam (KEPPRA) tablet 500 mg  500 mg Oral BID Rachael Fee, MD   500 mg at 03/22/13 1706  . linagliptin (TRADJENTA) tablet 5 mg  5 mg Oral Daily Rachael Fee, MD   5 mg at 03/22/13 0817  . magnesium hydroxide (MILK OF MAGNESIA)  suspension 30 mL  30 mL Oral Daily PRN Rachael Fee, MD      . metFORMIN (GLUCOPHAGE) tablet 1,000 mg  1,000 mg Oral BID WC Rachael Fee, MD   1,000 mg at 03/22/13 1706  . simvastatin (ZOCOR) tablet 40 mg  40 mg Oral q1800 Rachael Fee, MD   40 mg at 03/22/13 1706  . traZODone (DESYREL) tablet 50 mg  50 mg Oral QHS,MR X 1 Rachael Fee, MD   50 mg at 03/21/13 2136  . Vilazodone HCl (VIIBRYD) TABS 40 mg  40 mg Oral Daily Rachael Fee, MD   40 mg at 03/22/13 0818    Lab Results:  Results for orders placed during the hospital encounter of 03/19/13 (from the past 48 hour(s))  GLUCOSE, CAPILLARY     Status: Abnormal   Collection Time    03/20/13  7:48 PM      Result Value Range   Glucose-Capillary 303 (*) 70 - 99 mg/dL   Comment 1 Notify RN     Comment 2 Documented in Chart    GLUCOSE, CAPILLARY     Status: Abnormal   Collection Time    03/20/13  9:59 PM      Result Value Range   Glucose-Capillary 354 (*) 70 - 99 mg/dL   Comment 1 Documented in Chart     Comment 2 Notify RN    GLUCOSE, CAPILLARY     Status: Abnormal   Collection Time    03/21/13  6:51 AM      Result Value Range   Glucose-Capillary 235 (*) 70 - 99 mg/dL  GLUCOSE, CAPILLARY     Status: Abnormal   Collection Time    03/21/13 11:46 AM      Result Value Range   Glucose-Capillary 397 (*) 70 - 99 mg/dL  GLUCOSE, CAPILLARY     Status: Abnormal   Collection Time    03/21/13  3:54 PM      Result Value Range   Glucose-Capillary 256 (*) 70 - 99 mg/dL  GLUCOSE, CAPILLARY     Status: Abnormal   Collection Time    03/21/13  4:55 PM      Result Value Range   Glucose-Capillary 242 (*) 70 - 99 mg/dL   Comment 1 Notify RN    GLUCOSE, CAPILLARY     Status: Abnormal   Collection Time    03/21/13  9:26 PM      Result Value Range   Glucose-Capillary 288 (*) 70 - 99 mg/dL  GLUCOSE, CAPILLARY     Status: Abnormal   Collection Time    03/22/13  6:34 AM      Result Value Range   Glucose-Capillary 279 (*) 70 - 99 mg/dL   GLUCOSE, CAPILLARY     Status: Abnormal   Collection Time    03/22/13 11:50 AM  Result Value Range   Glucose-Capillary 307 (*) 70 - 99 mg/dL  GLUCOSE, CAPILLARY     Status: Abnormal   Collection Time    03/22/13  5:00 PM      Result Value Range   Glucose-Capillary 319 (*) 70 - 99 mg/dL    Physical Findings: AIMS: Facial and Oral Movements Muscles of Facial Expression: None, normal Lips and Perioral Area: None, normal Jaw: None, normal Tongue: None, normal,Extremity Movements Upper (arms, wrists, hands, fingers): None, normal Lower (legs, knees, ankles, toes): None, normal, Trunk Movements Neck, shoulders, hips: None, normal, Overall Severity Severity of abnormal movements (highest score from questions above): None, normal Incapacitation due to abnormal movements: None, normal, Dental Status Current problems with teeth and/or dentures?: No Does patient usually wear dentures?: No  CIWA:  CIWA-Ar Total: 0 COWS:     Treatment Plan Summary: Daily contact with patient to assess and evaluate symptoms and progress in treatment Medication management  Plan: Supportive approach/coping skills           Reassess tolerability of the Abilify  Medical Decision Making Problem Points:  Review of psycho-social stressors (1) Data Points:  Review of medication regiment & side effects (2) Review of new medications or change in dosage (2)  I certify that inpatient services furnished can reasonably be expected to improve the patient's condition.   Rachit Grim A 03/22/2013, 5:33 PM

## 2013-03-22 NOTE — [facility] Group Notes (Signed)
[facility] LCSW Group Therapy  03/22/2013  1:15 PM   Type of Therapy:  Group Therapy  Participation Level:  Active  Participation Quality:  Appropriate and Attentive  Affect:  Appropriate, Flat and Depressed  Cognitive:  Alert and Appropriate  Insight:  Developing/Improving and Engaged  Engagement in Therapy:  Developing/Improving and Engaged  Modes of Intervention:  Activity, Clarification, Confrontation, Discussion, Education, Exploration, Limit-setting, Orientation, Problem-solving, Rapport Building, Reality Testing, Socialization and Support  Summary of Progress/Problems: Patient was attentive and engaged with speaker from [employer].  Patient was attentive to speaker while they shared their story of dealing with mental health and overcoming it.  Patient expressed interest in their programs and services and received information on their agency.  Patient processed ways they can relate to the speaker.     Fatma Rutten Horton, LCSWA 03/22/2013 1:42 PM   

## 2013-03-22 NOTE — Progress Notes (Signed)
Adult Psychoeducational Group Note  Date:  03/22/2013 Time:  11:00AM Group Topic/Focus:  Leisure and Lifestyle Changes  Participation Level:  Active  Participation Quality:  Appropriate and Attentive  Affect:  Appropriate  Cognitive:  Alert and Appropriate  Insight: Appropriate  Engagement in Group:  Engaged  Modes of Intervention:  Discussion  Additional Comments:  Pt. Was attentive and appropriate during today's group discussion. Pt. Was able to complete worksheet on Self-Esteem. Pt was was able to write a list of positive things about yourself for each letter A-Z. Pt was able to work in a group and come up with the following words truthful, zesty and simple.   Bing Plume D 03/22/2013, 1:02 PM

## 2013-03-22 NOTE — Progress Notes (Signed)
Adult Psychoeducational Group Note  Date:  03/22/2013 Time:  9:00AM Group Topic/Focus:  Morning Wellness Group  Participation Level:  Active  Participation Quality:  Appropriate and Attentive  Affect:  Appropriate  Cognitive:  Alert and Appropriate  Insight: Appropriate  Engagement in Group:  Engaged  Modes of Intervention:  Discussion  Additional Comments:  Pt. Was attentive and appropriate during Morning Wellness group with nursing staff.   Bing Plume D 03/22/2013, 9:46 AM

## 2013-03-22 NOTE — Progress Notes (Signed)
Adult Psychoeducational Group Note  Date:  03/22/2013 Time:  11:08 AM  Group Topic/Focus:  Overcoming Stress:   The focus of this group is to define stress and help patients assess their triggers.  Participation Level:  Active  Participation Quality:  Appropriate  Affect:  Appropriate  Cognitive:  Appropriate  Insight: Appropriate and Good  Engagement in Group:  Engaged  Modes of Intervention:  Confrontation, Discussion, Education and Support  Additional Comments:  Pt participated appropriately in group.He identified money problems are a key stressor in his life. He also discussed how he handles panic attacks and a stress reliever he wants to try, getting a cat or a dog. "I want something I can cuddle".   Reynolds Bowl 03/22/2013, 11:08 AM

## 2013-03-22 NOTE — Progress Notes (Signed)
Recreation Therapy Notes  Date: 08.20.2014 Time: 3:00pm Location: [address] Dayroom  Group Topic: Communication, Team Building, Problem Solving  Goal Area(s) Addresses:  Patient will be able to recognize use of communication, team building and problem solving during course of group activity. Patient will verbalize need for communication, team building and problem solving to make group activity successful.  Patient will verbalize benefit of communication, team building and problem solving to post d/c goals.   Behavioral Response: Engaged, Appropriate, Active Listener  Intervention: Problem Solving Activity  Activity: Life Boat. Patients were given a scenario about being caught on a sinking ship. In this scenario patients were informed all members of group session, in addition to 8 individuals off of a list of 15 could fit on the life boat. List of individuals included people from all socioeconomic status, for example: President Obama, Midwife, Runner, broadcasting/film/video, Education officer, museum.    Education: Life Skills, Discharge Planning, Relapse Prevention  Education Outcome: Acknowledges understanding  Clinical Observations/Feedback: Patient actively participated in group activity, voicing opinions and debating with with group members appropriately. Patient verbalized all individuals he would put in life boat and gave appropriate justification for each individual. Patient identified individuals skills set as a primary reason for wanting to put them on the life boat. Patient contributed to wrap up discussion identifying critical thinking, decision making and team work as necessary skills to make activity successful. Patient stated he felt critical thinking and decision making skills are the most important skills he uses in decision making and were the most important to him during this activity.   Bryan Mcmillan, LRT/CTRS  Kemaria Dedic L 03/22/2013 8:59 AM

## 2013-03-22 NOTE — Progress Notes (Signed)
D:  Bryan Mcmillan reports that he slept ok and that his appetite is poor.  His energy level is low.  He rates depression at 8/10 and hopelessness at 9/10.  He states that he has increased anxiety and was in bed late this am, stating that he was trying to work through his anxiety.  He denies SI/HI/AVH at this time.  He did get up and go to group and is interacting appropriately.  He reports continued racing thoughts and inability to concentrate on one thought. A  Safety checks q 15 minutes.  Emotional support provided.  Medications administered as ordered. R  Safety maintained on unit.

## 2013-03-22 NOTE — Progress Notes (Addendum)
D: Pt is depressed in affect and mood. Pt's mom and brother were unable to make it to [facility] during visitation hours this evening.  Pt's depression was increased due to this. Pt did not attend group this evening. Pt observed minimally interacting within the milieu. Pt is currently denying any SI/HI. Pt is polite and cooperative with treatment.  A: Writer administered scheduled medications to pt. Continued support and availability as needed was extended to this pt. Staff continue to monitor pt with q41min checks.  R: No adverse drug reactions noted. Pt receptive to treatment. Pt remains safe at this time.

## 2013-03-23 DIAGNOSIS — F411 Generalized anxiety disorder: Secondary | ICD-10-CM

## 2013-03-23 LAB — GLUCOSE, CAPILLARY: Glucose-Capillary: 285 mg/dL — ABNORMAL HIGH (ref 70–99)

## 2013-03-23 MED ORDER — QUETIAPINE FUMARATE 25 MG PO TABS
25.0000 mg | ORAL_TABLET | Freq: Three times a day (TID) | ORAL | Status: DC | PRN
  Administered 2013-03-24 – 2013-03-26 (×4): 25 mg via ORAL
  Filled 2013-03-23 (×4): qty 1

## 2013-03-23 MED ORDER — QUETIAPINE FUMARATE 25 MG PO TABS
25.0000 mg | ORAL_TABLET | Freq: Once | ORAL | Status: AC
  Administered 2013-03-23: 25 mg via ORAL
  Filled 2013-03-23: qty 1

## 2013-03-23 NOTE — Tx Team (Signed)
Interdisciplinary Treatment Plan Update (Adult)  Date: 03/23/2013  Time Reviewed:  9:45 AM  Progress in Treatment: Attending groups: Yes Participating in groups:  Yes Taking medication as prescribed:  Yes Tolerating medication:  Yes Family/Significant othe contact made: Yes Patient understands diagnosis:  Yes Discussing patient identified problems/goals with staff:  Yes Medical problems stabilized or resolved:  Yes Denies suicidal/homicidal ideation: Yes Issues/concerns per patient self-inventory:  Yes Other:  New problem(s) identified: N/A  Discharge Plan or Barriers: Pt has follow up scheduled at [facility] for medication management and therapy.    Reason for Continuation of Hospitalization: Anxiety Depression Medication Stabilization  Comments: N/A  Estimated length of stay: 3-5 days  For review of initial/current patient goals, please see plan of care.  Attendees: Patient:     Family:     Physician:  Dr. Javier Glazier 03/23/2013 10:02 AM   Nursing:   Lamount Cranker, RN 03/23/2013 10:02 AM   Clinical Social Worker:  Reyes Ivan, LCSWA 03/23/2013 10:02 AM   Other: Verne Spurr, PA 03/23/2013 10:02 AM   Other:  Frankey Shown, MA care coordination 03/23/2013 10:02 AM   Other:  Juline Patch, LCSW 03/23/2013 10:02 AM   Other:     Other:    Other:    Other:    Other:    Other:    Other:     Scribe for Treatment Team:   Carmina Miller, 03/23/2013 10:02 AM

## 2013-03-23 NOTE — [facility] Group Notes (Signed)
[facility] LCSW Aftercare Discharge Planning Group Note   03/23/2013 8:45 AM  Participation Quality:  Alert and Appropriate   Mood/Affect:  Appropriate, Flat and Depressed   Depression Rating:  4  Anxiety Rating:  7  Thoughts of Suicide:  Pt denies SI/HI  Will you contract for safety?   Yes  Current AVH:  Pt denies  Plan for Discharge/Comments:  Pt attended discharge planning group and actively participated in group.  CSW provided pt with today's workbook.  Pt states that he is "hanging in there".  Pt states that he will return home in [place] with family and follow up at [facility] for medication management and therapy.  Pt requested information on Medicaid application and disability.  CSW provided pt with printouts/application.  No further needs voiced by pt at this time.    Transportation Means: Pt has access to transportation  Supports: Pt states that family is supportive  Reyes Ivan, LCSWA 03/23/2013 9:41 AM

## 2013-03-23 NOTE — [facility] Group Notes (Signed)
[facility] LCSW Group Therapy  03/23/2013  1:15 PM   Type of Therapy:  Group Therapy  Participation Level:  Active  Participation Quality:  Appropriate and Attentive  Affect:  Appropriate, Flat and Depressed  Cognitive:  Alert and Appropriate  Insight:  Developing/Improving and Engaged  Engagement in Therapy:  Developing/Improving and Engaged  Modes of Intervention:  Clarification, Confrontation, Discussion, Education, Exploration, Limit-setting, Orientation, Problem-solving, Rapport Building, Dance movement psychotherapist, Socialization and Support  Summary of Progress/Problems: The topic for today was feelings about relapse.  Pt discussed what relapse prevention is to them and identified triggers that they are on the path to relapse.  Pt processed their feeling towards relapse and was able to relate to peers.  Pt discussed coping skills that can be used for relapse prevention.   Pt shared that relapse for him is anxiety and pain.  Pt states that he has learned to ignore his pain and anxiety and use distractions such as eating, watching TV or playing video games.    Reyes Ivan, LCSWA 03/23/2013 1:58 PM

## 2013-03-23 NOTE — Progress Notes (Signed)
[facility] MD Progress Note  03/23/2013 5:11 PM Bryan Mcmillan  MRN:  [id number] Subjective:  Bryan Mcmillan is having a lot of anxiety. States he saw himself adopting the role of trying to help his peers. States when he does that he loses track of himself as he changes and adopts  different personas depending on who he is interacting with. He states he does not want to fall into those behaviors again as they make him worst afterwards and he loses his sense of self. States he thought he could challenge his social anxiety by interacting, the anxiety has been worst. He states he thinks bout death. He as able to talk to his daughter last night and this lifted up his spirits. He is not sure if the Abilify is causing more of the anxiety or not.  Diagnosis:   DSM5: Schizophrenia Disorders:   Obsessive-Compulsive Disorders:   Trauma-Stressor Disorders:   Substance/Addictive Disorders:   Depressive Disorders:  Major Depressive Disorder - Severe (296.23)  Axis I: Anxiety Disorder NOS and Social Anxiety  ADL's:  Intact  Sleep: Poor  Appetite:  Fair  Suicidal Ideation:  Plan:  denies Intent:  denies Means:  denies Homicidal Ideation:  Plan:  denies Intent:  denies Means:  denies AEB (as evidenced by):  Psychiatric Specialty Exam: Review of Systems  Constitutional: Negative.   Eyes: Positive for photophobia.  Respiratory: Negative.   Cardiovascular: Negative.   Gastrointestinal: Negative.   Musculoskeletal: Negative.   Skin: Negative.   Neurological: Positive for headaches.  Endo/Heme/Allergies: Negative.   Psychiatric/Behavioral: Positive for depression. The patient is nervous/anxious and has insomnia.     Blood pressure 79/50, pulse 98, temperature 97.7 F (36.5 C), temperature source Oral, resp. rate 18, height 5' 9.75" (1.772 m), weight 114.76 kg (253 lb).Body mass index is 36.55 kg/(m^2).  General Appearance: Fairly Groomed  Patent attorney::  Minimal  Speech:  Clear and Coherent and Slow   Volume:  Decreased  Mood:  Anxious, Depressed, Hopeless and worried  Affect:  Restricted  Thought Process:  Coherent and Goal Directed  Orientation:  Full (Time, Place, and Person)  Thought Content:  Rumination and worries, concerns  Suicidal Thoughts:  Thoughts about death  Homicidal Thoughts:  No  Memory:  Immediate;   Fair Recent;   Fair Remote;   Fair  Judgement:  Intact  Insight:  Present  Psychomotor Activity:  Restlessness  Concentration:  Fair  Recall:  Fair  Akathisia:  No  Handed:  Right  AIMS (if indicated):     Assets:  Desire for Improvement  Sleep:  Number of Hours: 6.5   Current Medications: Current Facility-Administered Medications  Medication Dose Route Frequency Provider Last Rate Last Dose  . acetaminophen (TYLENOL) tablet 650 mg  650 mg Oral Q6H PRN Rachael Fee, MD   650 mg at 03/23/13 4098  . alum & mag hydroxide-simeth (MAALOX/MYLANTA) 200-200-20 MG/5ML suspension 30 mL  30 mL Oral Q4H PRN Rachael Fee, MD   30 mL at 03/23/13 1607  . amphetamine-dextroamphetamine (ADDERALL XR) 24 hr capsule 10 mg  10 mg Oral Daily Rachael Fee, MD   10 mg at 03/23/13 0825  . ARIPiprazole (ABILIFY) tablet 2 mg  2 mg Oral Daily Rachael Fee, MD   2 mg at 03/23/13 0825  . aspirin-acetaminophen-caffeine (EXCEDRIN MIGRAINE) per tablet 1 tablet  1 tablet Oral Q6H PRN Sanjuana Kava, NP      . enoxaparin (LOVENOX) injection 115 mg  115 mg Subcutaneous Q12H Nicole Kindred  I Nwoko, NP   115 mg at 03/23/13 0824  . gabapentin (NEURONTIN) capsule 300 mg  300 mg Oral TID Rachael Fee, MD   300 mg at 03/23/13 1125  . hydrOXYzine (ATARAX/VISTARIL) tablet 25 mg  25 mg Oral Q4H PRN Sanjuana Kava, NP   25 mg at 03/20/13 1401  . insulin aspart (novoLOG) injection 0-15 Units  0-15 Units Subcutaneous TID WC Rachael Fee, MD   8 Units at 03/23/13 1210  . insulin aspart (novoLOG) injection 0-5 Units  0-5 Units Subcutaneous QHS Rachael Fee, MD   4 Units at 03/22/13 2059  . insulin glargine (LANTUS)  injection 40 Units  40 Units Subcutaneous QHS Rachael Fee, MD   40 Units at 03/22/13 2057  . levETIRAcetam (KEPPRA) tablet 500 mg  500 mg Oral BID Rachael Fee, MD   500 mg at 03/23/13 0825  . linagliptin (TRADJENTA) tablet 5 mg  5 mg Oral Daily Rachael Fee, MD   5 mg at 03/23/13 0825  . magnesium hydroxide (MILK OF MAGNESIA) suspension 30 mL  30 mL Oral Daily PRN Rachael Fee, MD      . metFORMIN (GLUCOPHAGE) tablet 1,000 mg  1,000 mg Oral BID WC Rachael Fee, MD   1,000 mg at 03/23/13 0825  . simvastatin (ZOCOR) tablet 40 mg  40 mg Oral q1800 Rachael Fee, MD   40 mg at 03/22/13 1706  . traZODone (DESYREL) tablet 50 mg  50 mg Oral QHS,MR X 1 Rachael Fee, MD   50 mg at 03/22/13 2233  . Vilazodone HCl (VIIBRYD) TABS 40 mg  40 mg Oral Daily Rachael Fee, MD   40 mg at 03/23/13 0825    Lab Results:  Results for orders placed during the hospital encounter of 03/19/13 (from the past 48 hour(s))  GLUCOSE, CAPILLARY     Status: Abnormal   Collection Time    03/21/13  9:26 PM      Result Value Range   Glucose-Capillary 288 (*) 70 - 99 mg/dL  GLUCOSE, CAPILLARY     Status: Abnormal   Collection Time    03/22/13  6:34 AM      Result Value Range   Glucose-Capillary 279 (*) 70 - 99 mg/dL  GLUCOSE, CAPILLARY     Status: Abnormal   Collection Time    03/22/13 11:50 AM      Result Value Range   Glucose-Capillary 307 (*) 70 - 99 mg/dL  GLUCOSE, CAPILLARY     Status: Abnormal   Collection Time    03/22/13  5:00 PM      Result Value Range   Glucose-Capillary 319 (*) 70 - 99 mg/dL  GLUCOSE, CAPILLARY     Status: Abnormal   Collection Time    03/22/13  8:04 PM      Result Value Range   Glucose-Capillary 324 (*) 70 - 99 mg/dL   Comment 1 Documented in Chart     Comment 2 Notify RN    GLUCOSE, CAPILLARY     Status: Abnormal   Collection Time    03/23/13  6:07 AM      Result Value Range   Glucose-Capillary 285 (*) 70 - 99 mg/dL  GLUCOSE, CAPILLARY     Status: Abnormal   Collection  Time    03/23/13 12:07 PM      Result Value Range   Glucose-Capillary 276 (*) 70 - 99 mg/dL    Physical Findings: AIMS: Facial  and Oral Movements Muscles of Facial Expression: None, normal Lips and Perioral Area: None, normal Jaw: None, normal Tongue: None, normal,Extremity Movements Upper (arms, wrists, hands, fingers): None, normal Lower (legs, knees, ankles, toes): None, normal, Trunk Movements Neck, shoulders, hips: None, normal, Overall Severity Severity of abnormal movements (highest score from questions above): None, normal Incapacitation due to abnormal movements: None, normal, Dental Status Current problems with teeth and/or dentures?: No Does patient usually wear dentures?: No  CIWA:  CIWA-Ar Total: 0 COWS:     Treatment Plan Summary: Daily contact with patient to assess and evaluate symptoms and progress in treatment Medication management  Plan: Supportive approach/coping skills           CBT/mindfulness           Trial with Seroquel  Medical Decision Making Problem Points:  Review of last therapy session (1) and Review of psycho-social stressors (1) Data Points:  Review of medication regiment & side effects (2) Review of new medications or change in dosage (2)  I certify that inpatient services furnished can reasonably be expected to improve the patient's condition.   Hieu Herms A 03/23/2013, 5:11 PM

## 2013-03-23 NOTE — Progress Notes (Signed)
Child/Adolescent Psychoeducational Group Note  Date:  03/23/2013 Time:  8:46 PM  Group Topic/Focus:  Wrap-Up Group:   The focus of this group is to help patients review their daily goal of treatment and discuss progress on daily workbooks.  Participation Level:  Active  Participation Quality:  Appropriate  Affect:  Appropriate  Cognitive:  Appropriate  Insight:  Appropriate and Good  Engagement in Group:  Engaged  Modes of Intervention:  Clarification and Support  Additional Comments:  Pt, stated that one good thing that happened was that his family came to visit him. One good thing was that he changed his meds and now he may be able to leave earlier than he thought. This was also his goal for the day  Bryan Mcmillan 03/23/2013, 8:46 PM

## 2013-03-23 NOTE — Progress Notes (Signed)
Patient ID: Bryan Mcmillan, male   DOB: 1971-05-07, 42 y.o.   MRN: [id number] Pt did not attend group.

## 2013-03-24 LAB — GLUCOSE, CAPILLARY: Glucose-Capillary: 370 mg/dL — ABNORMAL HIGH (ref 70–99)

## 2013-03-24 MED ORDER — CEPHALEXIN 500 MG PO CAPS
500.0000 mg | ORAL_CAPSULE | Freq: Three times a day (TID) | ORAL | Status: DC
  Filled 2013-03-24 (×2): qty 1

## 2013-03-24 NOTE — Progress Notes (Signed)
Adult Psychoeducational Group Note  Date:  03/24/2013 Time:  10:01 PM  Group Topic/Focus:  Wrap-Up Group:   The focus of this group is to help patients review their daily goal of treatment and discuss progress on daily workbooks.  Participation Level:  Active  Participation Quality:  Appropriate, Sharing and Supportive  Affect:  Appropriate  Cognitive:  Appropriate  Insight: Appropriate  Engagement in Group:  Engaged  Modes of Intervention:  Discussion  Additional Comments:  Pt attended group and was very supportive of others and engaged.   [place] Shi K 03/24/2013, 10:01 PM

## 2013-03-24 NOTE — Progress Notes (Signed)
Patient ID: Bryan Mcmillan, male   DOB: 11-28-1970, 42 y.o.   MRN: [id number]  D: Patient pleasant on approach tonight. Often joking with staff. Reports that they are trying to figure out his medication because they are saying the anxiety issue is what is causing the depression and SI. C/O Lt big toe hurting some and a little red but undersigned did not see anything significant.  A: Staff will monitor on q 15 minute checks, follow treatment plan, and give meds as ordered. R: Cooperative with staff on the unit

## 2013-03-24 NOTE — Progress Notes (Signed)
[facility] MD Progress Note  03/24/2013 5:54 PM Bryan Mcmillan  MRN:  [id number] Subjective:  Bryan Mcmillan continues to have a hard time. He endorses a lot of anxiety, more so around people. He describes how the noise of the door being shot loudly as part of the group activity was enough to get him out of control. States that he could not settle himself down. While at the cafeteria sates thst he had the thought and the image of stabbing his arm with the fork. States he cant explain he felt this way. He used a lot of his coping skills not to do it.  Diagnosis:   DSM5: Schizophrenia Disorders:   Obsessive-Compulsive Disorders:   Trauma-Stressor Disorders:   Substance/Addictive Disorders:   Depressive Disorders:  Major Depressive Disorder - Severe (296.23)  Axis I: Anxiety Disorder NOS  ADL's:  Intact  Sleep: Poor  Appetite:  Fair  Suicidal Ideation:  Plan:  Denies Intent:  Denies Means:  Denies Homicidal Ideation:  Plan:  denies Intent:  denies Means:  denies AEB (as evidenced by):  Psychiatric Specialty Exam: Review of Systems  Constitutional: Negative.   Eyes: Negative.   Respiratory: Negative.   Cardiovascular: Negative.   Gastrointestinal: Negative.   Genitourinary: Negative.   Musculoskeletal: Negative.   Skin: Negative.   Neurological: Positive for headaches.  Endo/Heme/Allergies: Negative.   Psychiatric/Behavioral: Positive for depression. The patient is nervous/anxious and has insomnia.     Blood pressure 94/57, pulse 103, temperature 98 F (36.7 C), temperature source Oral, resp. rate 16, height 5' 9.75" (1.772 m), weight 114.76 kg (253 lb).Body mass index is 36.55 kg/(m^2).  General Appearance: Fairly Groomed  Patent attorney::  Minimal  Speech:  Clear and Coherent and Slow  Volume:  Decreased  Mood:  Anxious and Depressed  Affect:  Restricted  Thought Process:  Coherent and Goal Directed  Orientation:  Full (Time, Place, and Person)  Thought Content:  worries, cocnerns  about his thoughts and behaviors, trying to "get to the bottom of it."   Suicidal Thoughts:  Thoughts of death  Homicidal Thoughts:  No  Memory:  Immediate;   Fair Recent;   Fair Remote;   Fair  Judgement:  Fair  Insight:  Present  Psychomotor Activity:  Restlessness  Concentration:  Fair  Recall:  Fair  Akathisia:  No  Handed:  Right  AIMS (if indicated):     Assets:  Desire for Improvement  Sleep:  Number of Hours: 6.25   Current Medications: Current Facility-Administered Medications  Medication Dose Route Frequency Provider Last Rate Last Dose  . acetaminophen (TYLENOL) tablet 650 mg  650 mg Oral Q6H PRN Rachael Fee, MD   650 mg at 03/23/13 0865  . alum & mag hydroxide-simeth (MAALOX/MYLANTA) 200-200-20 MG/5ML suspension 30 mL  30 mL Oral Q4H PRN Rachael Fee, MD   30 mL at 03/23/13 1607  . amphetamine-dextroamphetamine (ADDERALL XR) 24 hr capsule 10 mg  10 mg Oral Daily Rachael Fee, MD   10 mg at 03/24/13 0734  . aspirin-acetaminophen-caffeine (EXCEDRIN MIGRAINE) per tablet 1 tablet  1 tablet Oral Q6H PRN Sanjuana Kava, NP      . enoxaparin (LOVENOX) injection 115 mg  115 mg Subcutaneous Q12H Sanjuana Kava, NP   115 mg at 03/24/13 0738  . gabapentin (NEURONTIN) capsule 300 mg  300 mg Oral TID Rachael Fee, MD   300 mg at 03/24/13 1625  . hydrOXYzine (ATARAX/VISTARIL) tablet 25 mg  25 mg Oral Q4H PRN  Sanjuana Kava, NP   25 mg at 03/20/13 1401  . insulin aspart (novoLOG) injection 0-15 Units  0-15 Units Subcutaneous TID WC Rachael Fee, MD   5 Units at 03/24/13 1720  . insulin aspart (novoLOG) injection 0-5 Units  0-5 Units Subcutaneous QHS Rachael Fee, MD   3 Units at 03/23/13 2117  . insulin glargine (LANTUS) injection 40 Units  40 Units Subcutaneous QHS Rachael Fee, MD   40 Units at 03/23/13 2117  . levETIRAcetam (KEPPRA) tablet 500 mg  500 mg Oral BID Rachael Fee, MD   500 mg at 03/24/13 1625  . linagliptin (TRADJENTA) tablet 5 mg  5 mg Oral Daily Rachael Fee, MD    5 mg at 03/24/13 0734  . magnesium hydroxide (MILK OF MAGNESIA) suspension 30 mL  30 mL Oral Daily PRN Rachael Fee, MD      . metFORMIN (GLUCOPHAGE) tablet 1,000 mg  1,000 mg Oral BID WC Rachael Fee, MD   1,000 mg at 03/24/13 1625  . QUEtiapine (SEROQUEL) tablet 25 mg  25 mg Oral TID PRN Rachael Fee, MD   25 mg at 03/24/13 1326  . simvastatin (ZOCOR) tablet 40 mg  40 mg Oral q1800 Rachael Fee, MD   40 mg at 03/24/13 1723  . traZODone (DESYREL) tablet 50 mg  50 mg Oral QHS,MR X 1 Rachael Fee, MD   50 mg at 03/23/13 2116  . Vilazodone HCl (VIIBRYD) TABS 40 mg  40 mg Oral Daily Rachael Fee, MD   40 mg at 03/24/13 0981    Lab Results:  Results for orders placed during the hospital encounter of 03/19/13 (from the past 48 hour(s))  GLUCOSE, CAPILLARY     Status: Abnormal   Collection Time    03/22/13  8:04 PM      Result Value Range   Glucose-Capillary 324 (*) 70 - 99 mg/dL   Comment 1 Documented in Chart     Comment 2 Notify RN    GLUCOSE, CAPILLARY     Status: Abnormal   Collection Time    03/23/13  6:07 AM      Result Value Range   Glucose-Capillary 285 (*) 70 - 99 mg/dL  GLUCOSE, CAPILLARY     Status: Abnormal   Collection Time    03/23/13 12:07 PM      Result Value Range   Glucose-Capillary 276 (*) 70 - 99 mg/dL  GLUCOSE, CAPILLARY     Status: Abnormal   Collection Time    03/23/13  5:15 PM      Result Value Range   Glucose-Capillary 276 (*) 70 - 99 mg/dL  GLUCOSE, CAPILLARY     Status: Abnormal   Collection Time    03/23/13  8:49 PM      Result Value Range   Glucose-Capillary 273 (*) 70 - 99 mg/dL   Comment 1 Notify RN    GLUCOSE, CAPILLARY     Status: Abnormal   Collection Time    03/24/13  6:39 AM      Result Value Range   Glucose-Capillary 243 (*) 70 - 99 mg/dL  GLUCOSE, CAPILLARY     Status: Abnormal   Collection Time    03/24/13 12:06 PM      Result Value Range   Glucose-Capillary 370 (*) 70 - 99 mg/dL   Comment 1 Documented in Chart     Comment 2  Notify RN    GLUCOSE, CAPILLARY  Status: Abnormal   Collection Time    03/24/13  5:15 PM      Result Value Range   Glucose-Capillary 206 (*) 70 - 99 mg/dL   Comment 1 Documented in Chart     Comment 2 Notify RN      Physical Findings: AIMS: Facial and Oral Movements Muscles of Facial Expression: None, normal Lips and Perioral Area: None, normal Jaw: None, normal Tongue: None, normal,Extremity Movements Upper (arms, wrists, hands, fingers): None, normal Lower (legs, knees, ankles, toes): None, normal, Trunk Movements Neck, shoulders, hips: None, normal, Overall Severity Severity of abnormal movements (highest score from questions above): None, normal Incapacitation due to abnormal movements: None, normal, Dental Status Current problems with teeth and/or dentures?: No Does patient usually wear dentures?: No  CIWA:  CIWA-Ar Total: 0 COWS:     Treatment Plan Summary: Daily contact with patient to assess and evaluate symptoms and progress in treatment Medication management  Plan: Supportive approach/coping skills/CBT/Mindfulness           Continue trial with Seroquel  Medical Decision Making Problem Points:  Review of psycho-social stressors (1) Data Points:  Review of medication regiment & side effects (2)  I certify that inpatient services furnished can reasonably be expected to improve the patient's condition.   Kalisi Bevill A 03/24/2013, 5:54 PM

## 2013-03-24 NOTE — [facility] Group Notes (Signed)
[facility] Group Notes: (Clinical Social Work)   03/24/2013      Type of Therapy:  Group Therapy   Participation Level:  Did Not Attend    Marvine Encalade Grossman-Orr, LCSW 03/24/2013, 4:30 PM     

## 2013-03-24 NOTE — Progress Notes (Addendum)
Patient ID: Bryan Mcmillan, male   DOB: [birth year], 42 y.o.   MRN: [id number] D: Client is visible on the unit and is interactive with select peers. Affect is full; describes mood is "okay"; depression 6/10; hopelessness 6/10; appearance is well-groomed. Client is intrusive with poor boundaries during 1:1 interaction with this Clinical research associate. Passive SI.   A: Continue to encourage group attendance and participation, offer support and assistance in identifying triggers and development of coping skills and encouraged client to share thoughts and feelings to staff. Maintained safety through q 15 minute safety checks by staff.    R: Client reached in towards this writer who is pregnant and asked to touch this writer's badge to look at pins. This Clinical research associate retrieved badge from client and politely told him what the pins meanings were. Client then asked to touch this writer's stomach to 'feel the baby'; this writer told him that was not appropriate and that the 'baby was sleeping'. Client then made assualtive gesture to kick this writer in the stomach to 'wake up the baby' by bowing up his left knee towards writer, being about four inches from this writer's stomach. Client laughed after doing this gesture. This Clinical research associate stepped away from client and concluded interaction; informed charge nurse of client's threatening behavior. Client is medication compliant and contracts for safety with staff.

## 2013-03-24 NOTE — Progress Notes (Signed)
Patient ID: Bryan Mcmillan, male   DOB: 1970/12/31, 42 y.o.   MRN: [id number] 03-24-13 nursing shift note: D: this pt has been visible in the milieu and participating in groups. He requested staff not ask him if he is suicidal because it makes him suicidal, he stated. He asked staff to refer to his inventory sheet. A: RN reviewed his inventory sheet and he denied any SI presently. He has voiced no complaints or needs. R: on his inventory sheet he wrote slept well, appetite good, energy normal, attention improving with his depression rated at 4 and hopelessness rated at 5. Physical problems in the last 24 hrs have been headaches and blurred vision. After discharge he plans to get more involved with finding myself, see if I can get a novel published within 12 months and get a dog. Staff will continue to monitor and Q 15 min ck's continue.

## 2013-03-24 NOTE — [facility] Group Notes (Addendum)
[facility] Group Notes:  (Nursing/MHT/Case Management/Adjunct)  Date:  03/24/2013  Time:  12:27 PM  Type of Therapy:  Psychoeducational Skills  Participation Level:  Active  Participation Quality:  Appropriate  Affect:  Appropriate  Cognitive:  Appropriate  Insight:  Appropriate  Engagement in Group:  Engaged  Modes of Intervention:  Problem-solving  Summary of Progress/Problems: Pt did attend Healthy Coping Skills group, and engaged in treatment. Pt reported that he felt like he was having a better day as far as his anxiety, he reported that his medication regimen was working, and he just does not want any one to question him on being suicidal because it will make him suicidal.   Buford Dresser 03/24/2013, 12:27 PM

## 2013-03-24 NOTE — Progress Notes (Signed)
Psychoeducational Group Note  Date: 03/24/2013 Time:  1015  Group Topic/Focus:  Identifying Needs:   The focus of this group is to help patients identify their personal needs that have been historically problematic and identify healthy behaviors to address their needs.  Participation Level:  Active  Participation Quality:  Appropriate  Affect:  Appropriate  Cognitive:  Oriented  Insight:  Improving  Engagement in Group:  Engaged  Additional Comments:  Pt participated in the group, supported his peers and was supported by them.   Nikeria Kalman A 

## 2013-03-25 LAB — GLUCOSE, CAPILLARY
Glucose-Capillary: 303 mg/dL — ABNORMAL HIGH (ref 70–99)
Glucose-Capillary: 248 mg/dL — ABNORMAL HIGH (ref 70–99)

## 2013-03-25 MED ORDER — MAGNESIUM SULFATE GRAN
GRANULES | Freq: Once | Status: AC
  Administered 2013-03-25: 22:00:00 via TOPICAL
  Filled 2013-03-25: qty 454

## 2013-03-25 NOTE — Progress Notes (Signed)
[facility] MD Progress Note  03/25/2013 10:11 PM Bryan Mcmillan  MRN:  [id number] Subjective:  Shermon has continued to experience anxiety. The Seroquel is helping but is causing tiredness. He is still worried about his ability to function in the outside. He is trying to to work on his coping skills to get himself out of the depression and the negative thinking pattern when he experiences it.  Diagnosis:   DSM5: Schizophrenia Disorders:   Obsessive-Compulsive Disorders:   Trauma-Stressor Disorders:   Substance/Addictive Disorders:   Depressive Disorders:  Major Depressive Disorder - Severe (296.23)  Axis I: Anxiety Disorder NOS  ADL's:  Intact  Sleep: Fair  Appetite:  Fair  Suicidal Ideation:  Plan:  denies Intent:  denies Means:  denies Homicidal Ideation:  Plan:  denies Intent:  denies Means:  denies AEB (as evidenced by):  Psychiatric Specialty Exam: Review of Systems  Constitutional: Negative.   Eyes: Positive for photophobia.  Respiratory: Negative.   Cardiovascular: Negative.   Gastrointestinal: Negative.   Genitourinary: Negative.   Musculoskeletal: Negative.   Skin: Negative.   Neurological: Positive for headaches.  Endo/Heme/Allergies: Negative.   Psychiatric/Behavioral: Positive for depression. The patient is nervous/anxious.     Blood pressure 151/76, pulse 92, temperature 97.2 F (36.2 C), temperature source Oral, resp. rate 16, height 5' 9.75" (1.772 m), weight 114.76 kg (253 lb).Body mass index is 36.55 kg/(m^2).  General Appearance: Fairly Groomed  Patent attorney::  Minimal  Speech:  Clear and Coherent and Slow  Volume:  Decreased  Mood:  Anxious, Depressed and worried  Affect:  Restricted  Thought Process:  Coherent and Goal Directed  Orientation:  Full (Time, Place, and Person)  Thought Content:  worries, concerns, self doubts  Suicidal Thoughts:  No  Homicidal Thoughts:  No  Memory:  Immediate;   Fair Recent;   Fair Remote;   Fair  Judgement:  Fair   Insight:  Present  Psychomotor Activity:  Restlessness  Concentration:  Fair  Recall:  Fair  Akathisia:  No  Handed:  Right  AIMS (if indicated):     Assets:  Desire for Improvement Social Support  Sleep:  Number of Hours: 6.5   Current Medications: Current Facility-Administered Medications  Medication Dose Route Frequency Provider Last Rate Last Dose  . acetaminophen (TYLENOL) tablet 650 mg  650 mg Oral Q6H PRN Rachael Fee, MD   650 mg at 03/23/13 4098  . alum & mag hydroxide-simeth (MAALOX/MYLANTA) 200-200-20 MG/5ML suspension 30 mL  30 mL Oral Q4H PRN Rachael Fee, MD   30 mL at 03/25/13 1726  . amphetamine-dextroamphetamine (ADDERALL XR) 24 hr capsule 10 mg  10 mg Oral Daily Rachael Fee, MD   10 mg at 03/25/13 0751  . aspirin-acetaminophen-caffeine (EXCEDRIN MIGRAINE) per tablet 1 tablet  1 tablet Oral Q6H PRN Sanjuana Kava, NP      . enoxaparin (LOVENOX) injection 115 mg  115 mg Subcutaneous Q12H Sanjuana Kava, NP   115 mg at 03/25/13 2106  . gabapentin (NEURONTIN) capsule 300 mg  300 mg Oral TID Rachael Fee, MD   300 mg at 03/25/13 1615  . hydrOXYzine (ATARAX/VISTARIL) tablet 25 mg  25 mg Oral Q4H PRN Sanjuana Kava, NP   25 mg at 03/20/13 1401  . insulin aspart (novoLOG) injection 0-15 Units  0-15 Units Subcutaneous TID WC Rachael Fee, MD   11 Units at 03/25/13 1705  . insulin aspart (novoLOG) injection 0-5 Units  0-5 Units Subcutaneous QHS Reymundo Poll  Dub Mikes, MD   2 Units at 03/25/13 2149  . insulin glargine (LANTUS) injection 40 Units  40 Units Subcutaneous QHS Rachael Fee, MD   40 Units at 03/25/13 2146  . levETIRAcetam (KEPPRA) tablet 500 mg  500 mg Oral BID Rachael Fee, MD   500 mg at 03/25/13 1614  . linagliptin (TRADJENTA) tablet 5 mg  5 mg Oral Daily Rachael Fee, MD   5 mg at 03/25/13 0751  . magnesium hydroxide (MILK OF MAGNESIA) suspension 30 mL  30 mL Oral Daily PRN Rachael Fee, MD      . metFORMIN (GLUCOPHAGE) tablet 1,000 mg  1,000 mg Oral BID WC Rachael Fee, MD   1,000 mg at 03/25/13 1615  . QUEtiapine (SEROQUEL) tablet 25 mg  25 mg Oral TID PRN Rachael Fee, MD   25 mg at 03/24/13 2226  . simvastatin (ZOCOR) tablet 40 mg  40 mg Oral q1800 Rachael Fee, MD   40 mg at 03/25/13 1815  . traZODone (DESYREL) tablet 50 mg  50 mg Oral QHS,MR X 1 Rachael Fee, MD   50 mg at 03/25/13 2146  . Vilazodone HCl (VIIBRYD) TABS 40 mg  40 mg Oral Daily Rachael Fee, MD   40 mg at 03/25/13 1610    Lab Results:  Results for orders placed during the hospital encounter of 03/19/13 (from the past 48 hour(s))  GLUCOSE, CAPILLARY     Status: Abnormal   Collection Time    03/24/13  6:39 AM      Result Value Range   Glucose-Capillary 243 (*) 70 - 99 mg/dL  GLUCOSE, CAPILLARY     Status: Abnormal   Collection Time    03/24/13 12:06 PM      Result Value Range   Glucose-Capillary 370 (*) 70 - 99 mg/dL   Comment 1 Documented in Chart     Comment 2 Notify RN    GLUCOSE, CAPILLARY     Status: Abnormal   Collection Time    03/24/13  5:15 PM      Result Value Range   Glucose-Capillary 206 (*) 70 - 99 mg/dL   Comment 1 Documented in Chart     Comment 2 Notify RN    GLUCOSE, CAPILLARY     Status: Abnormal   Collection Time    03/24/13  9:08 PM      Result Value Range   Glucose-Capillary 278 (*) 70 - 99 mg/dL   Comment 1 Notify RN    GLUCOSE, CAPILLARY     Status: Abnormal   Collection Time    03/25/13  6:01 AM      Result Value Range   Glucose-Capillary 206 (*) 70 - 99 mg/dL  GLUCOSE, CAPILLARY     Status: Abnormal   Collection Time    03/25/13 12:02 PM      Result Value Range   Glucose-Capillary 347 (*) 70 - 99 mg/dL   Comment 1 Notify RN    GLUCOSE, CAPILLARY     Status: Abnormal   Collection Time    03/25/13  4:58 PM      Result Value Range   Glucose-Capillary 303 (*) 70 - 99 mg/dL  GLUCOSE, CAPILLARY     Status: Abnormal   Collection Time    03/25/13  9:35 PM      Result Value Range   Glucose-Capillary 248 (*) 70 - 99 mg/dL   Comment 1  Documented in Chart  Physical Findings: AIMS: Facial and Oral Movements Muscles of Facial Expression: None, normal Lips and Perioral Area: None, normal Jaw: None, normal Tongue: None, normal,Extremity Movements Upper (arms, wrists, hands, fingers): None, normal Lower (legs, knees, ankles, toes): None, normal, Trunk Movements Neck, shoulders, hips: None, normal, Overall Severity Severity of abnormal movements (highest score from questions above): None, normal Incapacitation due to abnormal movements: None, normal, Dental Status Current problems with teeth and/or dentures?: No Does patient usually wear dentures?: No  CIWA:  CIWA-Ar Total: 0 COWS:     Treatment Plan Summary: Daily contact with patient to assess and evaluate symptoms and progress in treatment Medication management  Plan: Supportive approach/coping skills          CBT/mindfulness          Optimize treatment with Seroquel Medical Decision Making Problem Points:  Review of last therapy session (1) and Review of psycho-social stressors (1) Data Points:  Review of medication regiment & side effects (2)  I certify that inpatient services furnished can reasonably be expected to improve the patient's condition.   Connar Keating A 03/25/2013, 10:11 PM

## 2013-03-25 NOTE — Progress Notes (Signed)
Patient ID: Bryan Mcmillan, male   DOB: 1971/04/23, 42 y.o.   MRN: [id number] 03-25-13 @ 1534 nursing shift note: D: pt has been very anxious, but appropriate today. This am RN put a student with the pt, with pt approval and he was very receptive to the idea. He has no complained about his great toe today. He stated he was having some suicidal ideation but was able to contract. A: staff continue to support and encourage. His behaviors are not congruent with his complaints of dizziness, headaches and blurred vision. Will continue to assess. R: on his inventory sheet he wrote slept fair, appetite improving, energy low, attention improving with his depression at 2 and hopelessness at 3. He stated he has some concerns about his long term memory. He does see a problem staying on his medications after discharge. RN will continue to monitor and Q 15 ck's continue.

## 2013-03-25 NOTE — Progress Notes (Signed)
Patient ID: Bryan Mcmillan, male   DOB: August 09, 1970, 42 y.o.   MRN: [id number] 03-25-13 @ 1805 nursing note: d: RN was unable to soak this pt feet in warm water with epsom salts. They are not available. A: RN called pharmacy and they advised RN to call materials mgmt on  Monday. R:unable to complete this nursing care order.

## 2013-03-25 NOTE — [facility] Group Notes (Signed)
[facility] Group Notes:  (Nursing/MHT/Case Management/Adjunct)  Date:  03/25/2013  Time:  11:02 AM  Type of Therapy:  Psychoeducational Skills  Participation Level:  Active  Participation Quality:  Appropriate  Affect:  Appropriate  Cognitive:  Appropriate  Insight:  Appropriate  Engagement in Group:  Engaged  Modes of Intervention:  Problem-solving  Summary of Progress/Problems: Pt did attend [employer] group, and engaged in treatment.   Jacquelyne Balint Shanta 03/25/2013, 11:02 AM

## 2013-03-25 NOTE — Progress Notes (Signed)
The focus of this group is to help patients review their daily goal of treatment and discuss progress on daily workbooks.  During wrap up group Bryan Mcmillan shared that his day got progressively better because he forced himself to socialize, which reduced his level of anxiety. Patient also shared that he had a very good visit with his mom and sister tonight. When asked about what kind of support system the patient plans to use when discharged from the hospital Bryan Mcmillan stated that he has set a goal to face and analyze his anxiety more openly and more often. When asked to name two positives about himself, Bryan Mcmillan shared that that he has a very creative imagination and that he loves his daughter very much.

## 2013-03-25 NOTE — Progress Notes (Signed)
Adult Psychoeducational Group Note  Date:  03/25/2013 Time:  1:59 PM  Group Topic/Focus:  Conflict Resolution:   The focus of this group is to discuss the conflict resolution process and how it may be used upon discharge.  Participation Level:  Active  Participation Quality:  Appropriate, Sharing and Supportive  Affect:  Appropriate  Cognitive:  Appropriate  Insight: Appropriate  Engagement in Group:  Engaged and Supportive  Modes of Intervention:  Activity   Elijio Miles 03/25/2013, 1:59 PM

## 2013-03-25 NOTE — [facility] Group Notes (Signed)
[facility] Group Notes:  (Clinical Social Work)  03/25/2013   3:00-4:00PM  Summary of Progress/Problems:   The main focus of today's process group was to   identify the patient's current support system and decide on other supports that can be put in place.  The picture on workbook was used to discuss why additional supports are needed, and a hand-out was distributed with four definitions/levels of support, then used to talk about how patients have given and received all different kinds of support.  An emphasis was placed on using counselor, doctor, therapy groups, 12-step groups, and problem-specific support groups to expand supports.  The patient identified that he is more comfortable since hospitalization with himself.  He considers his daughter, ex-wife and her new husband, best friend, siblings, parents, church, and group of friends in [place] as his supports.  He stated he is absolutely terrified of leaving this facility.  He stated that his goal is to be ready to leave by Wednesday.  He also feels that his anxiety drives his depression, and now that his anxiety is getting better he feels his depression is getting markedly better too.  The patient gave some "tough love" feedback to another patient and was able to identify that he could do this because he himself needs it too.  Type of Therapy:  Process Group  Participation Level:  Active  Participation Quality:  Appropriate, Attentive, Sharing and Supportive  Affect:  Anxious  Cognitive:  Alert, Appropriate and Oriented  Insight:  Engaged  Engagement in Therapy:  Engaged  Modes of Intervention:  Education,  Support and Processing  Ambrose Mantle, LCSW 03/25/2013, 4:37 PM

## 2013-03-25 NOTE — Progress Notes (Signed)
Patient ID: Bryan Mcmillan, male   DOB: 03/03/1971, 42 y.o.   MRN: [id number] Psychoeducational Group Note  Date:  03/25/2013 Time:  1100am  Group Topic/Focus:  Making Healthy Choices:   The focus of this group is to help patients identify negative/unhealthy choices they were using prior to admission and identify positive/healthier coping strategies to replace them upon discharge.  Participation Level:  Active  Participation Quality:  Appropriate  Affect:  Appropriate  Cognitive:  Appropriate  Insight:  Supportive  Engagement in Group:  Supportive  Additional Comments:    [name] 03/25/2013,1:06 PM

## 2013-03-26 LAB — GLUCOSE, CAPILLARY
Glucose-Capillary: 197 mg/dL — ABNORMAL HIGH (ref 70–99)
Glucose-Capillary: 253 mg/dL — ABNORMAL HIGH (ref 70–99)
Glucose-Capillary: 228 mg/dL — ABNORMAL HIGH (ref 70–99)

## 2013-03-26 NOTE — Progress Notes (Signed)
NUTRITION ASSESSMENT  Pt identified as at risk on the Malnutrition Screen Tool  INTERVENTION: 1. Educated patient on the importance of nutrition and encouraged intake of food and beverages. 2. Discussed weight goals. 3. Supplements: none at this time 4.  Discussed diabetic diet guidelines and instructed on CHO counting.  Written information on CHO counting and label reading provided to patient.  Discussed AIC and importance of glucose control.  Encouraged continued weight loss with healthy eating.  Patient states goal weight of 200 lbs.  NUTRITION DIAGNOSIS: Unintentional weight loss related to sub-optimal intake as evidenced by pt report.   Goal: Pt to meet >/= 90% of their estimated nutrition needs.  Monitor:  PO intake  Assessment:  Patient admitted with severe anxiety and SI.  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  HgbA1C=11.5 4/14.  HDL 28.7.   Patient is on Glucophage, Zocor, Lantus, and Novolog.  Weight was 286 lbs 12/12/12. Patient with a 33 lb (11%) weight loss in the past 3 months.  [facility] regarding glucose control 07/02/12.  Patient reports that weight loss has been intentional although he states that he has not been eating well for the last month prior to admit.     42 y.o. male  Height: Ht Readings from Last 1 Encounters:  03/19/13 5' 9.75" (1.772 m)    Weight: Wt Readings from Last 1 Encounters:  03/19/13 253 lb (114.76 kg)    Weight Hx: Wt Readings from Last 10 Encounters:  03/19/13 253 lb (114.76 kg)  12/12/12 286 lb (129.729 kg)  11/23/12 290 lb (131.543 kg)  11/08/12 295 lb (133.811 kg)  09/06/12 295 lb (133.811 kg)  08/25/12 291 lb (131.997 kg)  06/26/12 286 lb (129.729 kg)  06/21/12 287 lb 8 oz (130.409 kg)  06/01/12 290 lb (131.543 kg)  05/23/12 298 lb (135.172 kg)    BMI:  Body mass index is 36.55 kg/(m^2). Pt meets criteria for obesity grade 1 based on current  BMI.  Estimated Nutritional Needs: Kcal: 25-30 kcal/kg Protein: > 1 gram protein/kg Fluid: 1 ml/kcal  Diet Order: Carb Control Pt is also offered choice of unit snacks mid-morning and mid-afternoon.  Pt is eating as desired.   Lab results and medications reviewed.   Oran Rein, RD, LDN Clinical Inpatient Dietitian Pager:  [phone] Weekend and after hours pager:  [phone]

## 2013-03-26 NOTE — Progress Notes (Signed)
Patient ID: Bryan Mcmillan, male   DOB: 1970/09/01, 42 y.o.   MRN: [id number] D- Patient reports poor sleep due to roommate's snoring.  His energy level is low and his ability to pay attention is improving.  He rates his depression at 1 and hopelessness at 3/10.  He denies thoughts of self harm and He reports that he has a headache but it is manageable.  Pt shaved today and says that he has been here 6 days and " I might be wearing out my welcome".  Patient has been attending groups, he denies self harm thoughts.  He continues to be a fall risk because he feels his balance is "off and I feel like I list" after brain surgery.  Reviewed fall precautions with patient.

## 2013-03-26 NOTE — Progress Notes (Signed)
Adult Psychoeducational Group Note  Date:  03/26/2013 Time:  8:47 PM  Group Topic/Focus:  Wrap-Up Group:   The focus of this group is to help patients review their daily goal of treatment and discuss progress on daily workbooks.  Participation Level:  Active  Participation Quality:  Appropriate and Attentive  Affect:  Appropriate  Cognitive:  Appropriate and Oriented  Insight: Appropriate and Good  Engagement in Group:  Engaged  Modes of Intervention:  Discussion and Support  Additional Comments: The question posed for the group was if you could do anything on a day like today, what would you choose? Pt shared because he was feeling better he would love to swing in a hammock, spend time with his daughter or have a party with a BBQ.  Reynolds Bowl 03/26/2013, 8:47 PM

## 2013-03-26 NOTE — Tx Team (Signed)
Interdisciplinary Treatment Plan Update (Adult)  Date: 03/26/2013  Time Reviewed:  9:45 AM  Progress in Treatment: Attending groups: Yes Participating in groups:  Yes Taking medication as prescribed:  Yes Tolerating medication:  Yes Family/Significant othe contact made: Yes  Patient understands diagnosis:  Yes Discussing patient identified problems/goals with staff:  Yes Medical problems stabilized or resolved:  Yes Denies suicidal/homicidal ideation: Yes Issues/concerns per patient self-inventory:  Yes Other:  New problem(s) identified: N/A  Discharge Plan or Barriers: Pt will follow up at [facility] for medication management and therapy.    Reason for Continuation of Hospitalization: Anxiety Depression Medication Stabilization  Comments: N/A  Estimated length of stay: 1-2 days  For review of initial/current patient goals, please see plan of care.  Attendees: Patient:     Family:     Physician:  Dr. Javier Glazier 03/26/2013 10:30 AM   Nursing:   Quintella Reichert, RN 03/26/2013 10:30 AM   Clinical Social Worker:  Reyes Ivan, LCSWA 03/26/2013 10:30 AM   Other: Neill Loft, RN 03/26/2013 10:30 AM   Other:  Frankey Shown, MA care coordination 03/26/2013 10:30 AM   Other:  Juline Patch, LCSW 03/26/2013 10:30 AM   Other:     Other:    Other:    Other:    Other:    Other:    Other:     Scribe for Treatment Team:   Carmina Miller, 03/26/2013 10:30 AM

## 2013-03-26 NOTE — [facility] Group Notes (Signed)
[facility] LCSW Group Therapy  03/26/2013  1:15 PM   Type of Therapy:  Group Therapy  Participation Level:  Active  Participation Quality:  Appropriate and Attentive  Affect:  Appropriate  Cognitive:  Alert and Appropriate  Insight:  Developing/Improving and Engaged  Engagement in Therapy:  Developing/Improving and Engaged  Modes of Intervention:  Clarification, Confrontation, Discussion, Education, Exploration, Limit-setting, Orientation, Problem-solving, Rapport Building, Dance movement psychotherapist, Socialization and Support  Summary of Progress/Problems: Pt identified obstacles faced currently and processed barriers involved in overcoming these obstacles. Pt identified steps necessary for overcoming these obstacles and explored motivation (internal and external) for facing these difficulties head on. Pt further identified one area of concern in their lives and chose a goal to focus on for today.  Pt states that he is working on overcoming his anxiety.  Pt states that he has been interacting with others in the group setting, talking with others and using his talents to his benefit.  Pt also sang part of a song that he felt related to a peer.  CSW commented to pt that on Day 1 of treatment he was anxious in the group setting and anxious to talk, but now he is singing.  Pt was insightful during group discussion but had to be redirected from getting off topic at times.  Pt actively participated and was engaged in group discussion.    Bryan Mcmillan, [place] 03/26/2013 2:24 PM

## 2013-03-26 NOTE — Progress Notes (Signed)
Recreation Therapy Notes  Date: 08.25.2014 Time: 3:00pm Location: [address] Dayroom  Group Topic: Stress Management  Goal Area(s) Addresses:  Patient will verbalize importance of using healthy stress management.  Patient will identify stress management technique of choice.  Behavioral Response: Did not attend  Bryan Mcmillan, LRT/CTRS  Bryan Mcmillan 03/26/2013 4:08 PM

## 2013-03-26 NOTE — [facility] Group Notes (Signed)
[facility] LCSW Aftercare Discharge Planning Group Note   03/26/2013 8:45 AM  Participation Quality:  Alert and Appropriate   Mood/Affect:  Appropriate and Calm  Depression Rating:  1  Anxiety Rating:  5  Thoughts of Suicide:  Pt denies SI/HI  Will you contract for safety?   Yes  Current AVH:  Pt denies  Plan for Discharge/Comments:  Pt attended discharge planning group and actively participated in group.  CSW provided pt with today's workbook.  Pt states that he is doing okay today and was happy to see his family.  Pt states that he has been working on interacting more with the group while here and plans to continue being social when he leaves here.  Pt states that he will return home in [place] with family and follow up at [facility] for medication management and therapy.  No further needs voiced by pt at this time.    Transportation Means: Pt has access to transportation  Supports: Pt states that family is supportive  Reyes Ivan, LCSWA 03/26/2013 10:01 AM

## 2013-03-26 NOTE — Progress Notes (Signed)
D: Patient resting in bed with eyes closed.  Respirations even and unlabored.  Patient appears to be in no apparent distress. A: Staff to monitor Q 15 mins for safety.   R:Patient remains safe on the unit.  

## 2013-03-26 NOTE — Progress Notes (Signed)
Patient has been observed up and active on the unit, he as been in the dayroom interacting with peers and playing cards. Patient attended group this evening and participated. He hopes to discharge by Wednesday. Patient reports that he has improved a little with his social anxiety by attending groups. Patient currently denies si/hi/a/v hallucinations. Support and encouragement offered, safety maintained on unit with 15 min checks, will continue to monitor.

## 2013-03-27 DIAGNOSIS — F329 Major depressive disorder, single episode, unspecified: Secondary | ICD-10-CM

## 2013-03-27 LAB — GLUCOSE, CAPILLARY: Glucose-Capillary: 256 mg/dL — ABNORMAL HIGH (ref 70–99)

## 2013-03-27 MED ORDER — GABAPENTIN 300 MG PO CAPS: 300.0000 mg | ORAL_CAPSULE | Freq: Three times a day (TID) | ORAL | Status: DC

## 2013-03-27 MED ORDER — ENOXAPARIN SODIUM 150 MG/ML ~~LOC~~ SOLN: 150.0000 mg | Freq: Two times a day (BID) | SUBCUTANEOUS | Status: DC

## 2013-03-27 MED ORDER — PRAVASTATIN SODIUM 80 MG PO TABS: 80.0000 mg | ORAL_TABLET | Freq: Every day | ORAL | Status: DC

## 2013-03-27 MED ORDER — TRAZODONE HCL 50 MG PO TABS: 50.0000 mg | ORAL_TABLET | Freq: Every evening | ORAL | Status: DC | PRN

## 2013-03-27 MED ORDER — VILAZODONE HCL 40 MG PO TABS: 40.0000 mg | ORAL_TABLET | Freq: Every day | ORAL | Status: DC

## 2013-03-27 MED ORDER — LEVETIRACETAM 500 MG PO TABS: 500.0000 mg | ORAL_TABLET | Freq: Two times a day (BID) | ORAL | Status: DC

## 2013-03-27 MED ORDER — METFORMIN HCL 1000 MG PO TABS: 1000.0000 mg | ORAL_TABLET | Freq: Two times a day (BID) | ORAL | Status: DC

## 2013-03-27 MED ORDER — HYDROXYZINE HCL 25 MG PO TABS: 25.0000 mg | ORAL_TABLET | ORAL | Status: DC | PRN

## 2013-03-27 MED ORDER — AMPHETAMINE-DEXTROAMPHET ER 10 MG PO CP24: 10.0000 mg | ORAL_CAPSULE | Freq: Every day | ORAL | Status: DC

## 2013-03-27 MED ORDER — INSULIN GLARGINE 100 UNIT/ML ~~LOC~~ SOLN: 40.0000 [IU] | Freq: Every day | SUBCUTANEOUS | Status: DC

## 2013-03-27 MED ORDER — SITAGLIPTIN PHOSPHATE 100 MG PO TABS: 100.0000 mg | ORAL_TABLET | Freq: Every day | ORAL | Status: DC

## 2013-03-27 MED ORDER — ASPIRIN-ACETAMINOPHEN-CAFFEINE 250-250-65 MG PO TABS: 1.0000 | ORAL_TABLET | Freq: Four times a day (QID) | ORAL | Status: DC | PRN

## 2013-03-27 NOTE — Progress Notes (Signed)
Adult Psychoeducational Group Note  Date:  03/27/2013 Time:  12:19 PM  Group Topic/Focus:  Recovery Goals:   The focus of this group is to identify appropriate goals for recovery and establish a plan to achieve them.  Participation Level:  Active  Participation Quality:  Appropriate and Attentive  Affect:  Appropriate  Cognitive:  Alert and Appropriate  Insight: Appropriate and Improving  Engagement in Group:  Engaged  Modes of Intervention:  Discussion, Socialization and Support  Additional Comments:  Pt came to group and shared that low-self-esteem and past events were standing between him and recovery. Pt plans on changing this by restructuring his negative thoughts and taking on jobs that are less stressful than past jobs. 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   Cathlean Cower 03/27/2013, 12:19 PM
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
## 2013-03-27 NOTE — [facility] Group Notes (Addendum)
[facility] LCSW Group Therapy      Feelings About Diagnosis 1:15 - 2:30 PM         03/27/2013  2:57 PM   Type of Therapy:  Group Therapy  Participation Level:  Active  Participation Quality:  Appropriate  Affect:  Appropriate  Cognitive:  Alert and Appropriate  Insight:  Developing/Improving and Engaged  Engagement in Therapy:  Developing/Improving and Engaged  Modes of Intervention:  Discussion, Education, Exploration, Problem-Solving, Rapport Building, Support  Summary of Progress/Problems:  Patient actively participated in group. Patient discussed past and present diagnosis and the effects it has had on  life.  Patient talked about family and society being judgmental and the stigma associated with having a mental health diagnosis.  He shared he had initially felt hopeless because of his diagnosis but has learned to look for things in life to give him purpose such as his daughter.  Wynn Banker 03/27/2013 2:57 PM

## 2013-03-27 NOTE — Progress Notes (Signed)
The focus of this group is to educate the patient on the purpose and policies of crisis stabilization and provide a format to answer questions about their admission.  The group details unit policies and expectations of patients while admitted.  Patient attended 0900 nurse education orientation group this morning.  Patient actively participated, appropriate affect, alert, appropriate insight and engagement.  Patient will work on goals for discharge today. 

## 2013-03-27 NOTE — Progress Notes (Signed)
Recreation Therapy Notes  Date: 08.26.2014 Time: 2:45pm Location: [address] Dayroom  Group Topic: Animal Assisted Activities  Goal Area(s) Addresses:  Patient will interact appropriately with dog team.    Behavioral Response: Appropriate, Attentive, Engaged  Education: Coping Skill   Education Outcome: Acknowledges understanding  Clinical Observations/Feedback: Dog Team: [employer]. Patient interacted appropriately with peer, dog team and LRT.    Marykay Lex Brittanni Cariker, LRT/CTRS  Jearl Klinefelter 03/27/2013 4:48 PM

## 2013-03-27 NOTE — Progress Notes (Signed)
D: Pt & mother POA verbalized understanding of all d/c instructions. Pt denies SI,HI, & AVH. Pt was given c-pap & locker 13 belongings. Pt d/c,d home with mother.

## 2013-03-27 NOTE — Progress Notes (Signed)
[facility] Adult Case Management Discharge Plan :  Will you be returning to the same living situation after discharge: Yes,  returning home with family At discharge, do you have transportation home?:Yes,  family will pick pt up Do you have the ability to pay for your medications:Yes,  access to meds  Release of information consent forms completed and in the chart;  Patient's signature needed at discharge.  Patient to Follow up at: Follow-up Information   Follow up with [facility] On 03/29/2013. (Walk in on this date for hospital discharge appointment.  Walk in clinic is Monday - Friday 8 am - 3 pm.  )    Contact information:   [address], [place] [address] Phone: [phone] Fax: [phone]      Patient denies SI/HI:   Yes,  denies SI/HI    Safety Planning and Suicide Prevention discussed:  Yes,  discussed with pt and pt's mother.  See suicide prevention education note.   Carmina Miller 03/27/2013, 11:00 AM

## 2013-03-27 NOTE — [facility] Suicide Risk Assessment (Signed)
Suicide Risk Assessment  Discharge Assessment     Demographic Factors:  Male and Caucasian  Mental Status Per Nursing Assessment::   On Admission:  Suicidal ideation indicated by patient  Current Mental Status by Physician: In full contact with reality. There are no suicidal ideas, plans or intent. His mood is worried but endorses he feels better. Affect is appropriate. Endorses that now he feels he has a direction of how to go about things about his life. Marland Kitchen He is tolerating the Seroquel well so far. He is aware of some deficits with short term memory and wonders if it is secondary to the Seroquel or his neurological condition. He is going to follow up on outpatient basis.    Loss Factors: Decrease in vocational status, Loss of significant relationship and Decline in physical health  Historical Factors: NA  Risk Reduction Factors:   Responsible for children under 71 years of age, Sense of responsibility to family, Living with another person, especially a relative and Positive social support  Continued Clinical Symptoms:  Depression:   Hopelessness Insomnia Severe  Cognitive Features That Contribute To Risk:  Closed-mindedness Polarized thinking Thought constriction (tunnel vision)    Suicide Risk:  Minimal: No identifiable suicidal ideation.  Patients presenting with no risk factors but with morbid ruminations; may be classified as minimal risk based on the severity of the depressive symptoms  Discharge Diagnoses:   AXIS I:  Major Depression, Recurrent severe, Anxiety Disorder NOS AXIS II:  Deferred AXIS III:   Past Medical History  Diagnosis Date  . Asthma   . Depression   . Diabetes   . Headache(784.0)     frequent  . Kidney disease   . Kidney stones   . Migraines   . History of blood clots   . Seizure   . Hyperlipidemia   . Obesity   . Sleep apnea   . DVT (deep venous thrombosis), hx of recurrent 05/23/2012  . History of blood clot in brain, 2012 - followed  by [facility] 05/23/2012  . Seizure disorder - followed by [facility] 05/23/2012  . Hyperlipemia 08/25/2012  . Anxiety   . Tuberculosis   . Pneumonia    AXIS IV:  occupational problems and other psychosocial or environmental problems AXIS V:  51-60 moderate symptoms  Plan Of Care/Follow-up recommendations:  Activity:  as tolerated Diet:  regular Follow up [facility] Will recommend for neuropsychological testing  Is patient on multiple antipsychotic therapies at discharge:  No   Has Patient had three or more failed trials of antipsychotic monotherapy by history:  No  Recommended Plan for Multiple Antipsychotic Therapies: NA  Shawny Borkowski A 03/27/2013, 3:46 PM

## 2013-03-30 NOTE — Progress Notes (Signed)
Patient Discharge Instructions:  After Visit Summary (AVS):   Faxed to:  03/30/13 Discharge Summary Note:   Faxed to:  03/30/13 Psychiatric Admission Assessment Note:   Faxed to:  03/30/13 Suicide Risk Assessment - Discharge Assessment:   Faxed to:  03/30/13 Faxed/Sent to the Next Level Care provider:  03/30/13 Faxed to [facility] @ [phone]  Jerelene Redden, 03/30/2013, 3:29 PM

## 2013-04-03 ENCOUNTER — Telehealth: Payer: Self-pay | Admitting: Family Medicine

## 2013-04-03 ENCOUNTER — Encounter: Payer: Self-pay | Admitting: Family Medicine

## 2013-04-03 ENCOUNTER — Encounter (HOSPITAL_COMMUNITY): Payer: Self-pay | Admitting: Family Medicine

## 2013-04-03 ENCOUNTER — Emergency Department (HOSPITAL_COMMUNITY)
Admission: EM | Admit: 2013-04-03 | Discharge: 2013-04-04 | Disposition: A | Discharge status: Other Acute Inpatient Hospital | Payer: Self-pay | Attending: Emergency Medicine | Admitting: Emergency Medicine

## 2013-04-03 DIAGNOSIS — F411 Generalized anxiety disorder: Secondary | ICD-10-CM | POA: Insufficient documentation

## 2013-04-03 DIAGNOSIS — E669 Obesity, unspecified: Secondary | ICD-10-CM | POA: Insufficient documentation

## 2013-04-03 DIAGNOSIS — Z880 Allergy status to penicillin: Secondary | ICD-10-CM | POA: Insufficient documentation

## 2013-04-03 DIAGNOSIS — F333 Major depressive disorder, recurrent, severe with psychotic symptoms: Secondary | ICD-10-CM | POA: Insufficient documentation

## 2013-04-03 DIAGNOSIS — F419 Anxiety disorder, unspecified: Secondary | ICD-10-CM

## 2013-04-03 DIAGNOSIS — E785 Hyperlipidemia, unspecified: Secondary | ICD-10-CM | POA: Insufficient documentation

## 2013-04-03 DIAGNOSIS — E119 Type 2 diabetes mellitus without complications: Secondary | ICD-10-CM | POA: Insufficient documentation

## 2013-04-03 DIAGNOSIS — G43909 Migraine, unspecified, not intractable, without status migrainosus: Secondary | ICD-10-CM | POA: Insufficient documentation

## 2013-04-03 DIAGNOSIS — Z8701 Personal history of pneumonia (recurrent): Secondary | ICD-10-CM | POA: Insufficient documentation

## 2013-04-03 DIAGNOSIS — F329 Major depressive disorder, single episode, unspecified: Secondary | ICD-10-CM

## 2013-04-03 DIAGNOSIS — Z87442 Personal history of urinary calculi: Secondary | ICD-10-CM | POA: Insufficient documentation

## 2013-04-03 DIAGNOSIS — J45909 Unspecified asthma, uncomplicated: Secondary | ICD-10-CM | POA: Insufficient documentation

## 2013-04-03 DIAGNOSIS — G40909 Epilepsy, unspecified, not intractable, without status epilepticus: Secondary | ICD-10-CM | POA: Insufficient documentation

## 2013-04-03 DIAGNOSIS — Z794 Long term (current) use of insulin: Secondary | ICD-10-CM | POA: Insufficient documentation

## 2013-04-03 DIAGNOSIS — Z8611 Personal history of tuberculosis: Secondary | ICD-10-CM | POA: Insufficient documentation

## 2013-04-03 DIAGNOSIS — R45851 Suicidal ideations: Secondary | ICD-10-CM | POA: Insufficient documentation

## 2013-04-03 DIAGNOSIS — Z86718 Personal history of other venous thrombosis and embolism: Secondary | ICD-10-CM | POA: Insufficient documentation

## 2013-04-03 DIAGNOSIS — Z87891 Personal history of nicotine dependence: Secondary | ICD-10-CM | POA: Insufficient documentation

## 2013-04-03 DIAGNOSIS — Z79899 Other long term (current) drug therapy: Secondary | ICD-10-CM | POA: Insufficient documentation

## 2013-04-03 NOTE — ED Notes (Signed)
Patient presents on IVC. IVC states that patient told [employer] officer that he intended to kill himself and his mother. Patient lives at home with his mother. Officer transported patient to [facility] who stated they could not keep patient due to the medications that patient on.

## 2013-04-03 NOTE — ED Notes (Signed)
Patient states that mom wouldn't give him his space and it kept escalating to the point where he was going to hurt himself or his mother. Patient states that at this moment "he is trying not to have thoughts of hurting himself or others."

## 2013-04-03 NOTE — Telephone Encounter (Signed)
Please refill his test strips - find out where he wants this rx sent.  Did his psychiatrist want him to see a neuropsychologist? If so, they may wish to make the referral as they may know whom is best to see?

## 2013-04-03 NOTE — Telephone Encounter (Signed)
Please see pt email. In response please call him to let him know: We are very sorry to hear about his job and recent psych issues.  This may not be the best practice for him if he has no insurance. Please provide him with the [facility] information. Also, for any psych issues especially given the degree of his illness I would advise he see psychiatry or call 911 or go to the emergency room immediately if having suicidal thoughts.

## 2013-04-03 NOTE — Telephone Encounter (Signed)
Spoke with patient and explained.

## 2013-04-03 NOTE — ED Notes (Addendum)
Pt has shirt, shorts, underwear and socks.

## 2013-04-04 ENCOUNTER — Inpatient Hospital Stay (HOSPITAL_COMMUNITY)
Admission: EM | Admit: 2013-04-04 | Discharge: 2013-04-07 | DRG: 885 | Disposition: A | Discharge status: Home / Self Care | Payer: No Typology Code available for payment source | Source: Intra-hospital | Attending: Psychiatry | Admitting: Psychiatry

## 2013-04-04 ENCOUNTER — Encounter (HOSPITAL_COMMUNITY): Payer: Self-pay | Admitting: Emergency Medicine

## 2013-04-04 ENCOUNTER — Encounter (HOSPITAL_COMMUNITY): Payer: Self-pay | Admitting: Intensive Care

## 2013-04-04 DIAGNOSIS — J45909 Unspecified asthma, uncomplicated: Secondary | ICD-10-CM | POA: Diagnosis present

## 2013-04-04 DIAGNOSIS — Z79899 Other long term (current) drug therapy: Secondary | ICD-10-CM

## 2013-04-04 DIAGNOSIS — F411 Generalized anxiety disorder: Secondary | ICD-10-CM | POA: Diagnosis present

## 2013-04-04 DIAGNOSIS — F333 Major depressive disorder, recurrent, severe with psychotic symptoms: Secondary | ICD-10-CM

## 2013-04-04 DIAGNOSIS — F329 Major depressive disorder, single episode, unspecified: Secondary | ICD-10-CM

## 2013-04-04 DIAGNOSIS — E119 Type 2 diabetes mellitus without complications: Secondary | ICD-10-CM | POA: Diagnosis present

## 2013-04-04 DIAGNOSIS — R45851 Suicidal ideations: Secondary | ICD-10-CM

## 2013-04-04 DIAGNOSIS — F332 Major depressive disorder, recurrent severe without psychotic features: Principal | ICD-10-CM | POA: Diagnosis present

## 2013-04-04 LAB — ETHANOL: Alcohol, Ethyl (B): <11 mg/dL (ref 0–11)

## 2013-04-04 LAB — COMPREHENSIVE METABOLIC PANEL
Albumin: 3.8 g/dL (ref 3.5–5.2)
BUN: 16 mg/dL (ref 6–23)
Creatinine, Ser: 0.83 mg/dL (ref 0.50–1.35)
GFR calc Af Amer: >90 mL/min (ref >90)
Glucose, Bld: 263 mg/dL — ABNORMAL HIGH (ref 70–99)
Total Protein: 7.3 g/dL (ref 6.0–8.3)

## 2013-04-04 LAB — GLUCOSE, CAPILLARY
Glucose-Capillary: 182 mg/dL — ABNORMAL HIGH (ref 70–99)
Glucose-Capillary: 300 mg/dL — ABNORMAL HIGH (ref 70–99)

## 2013-04-04 LAB — RAPID URINE DRUG SCREEN, HOSP PERFORMED
Benzodiazepines: NOT DETECTED
Cocaine: NOT DETECTED

## 2013-04-04 LAB — CBC
HCT: 43 % (ref 39.0–52.0)
Hemoglobin: 14.6 g/dL (ref 13.0–17.0)
MCH: 29.1 pg (ref 26.0–34.0)
MCHC: 34 g/dL (ref 30.0–36.0)
MCV: 85.7 fL (ref 78.0–100.0)
RDW: 13 % (ref 11.5–15.5)

## 2013-04-04 LAB — SALICYLATE LEVEL: Salicylate Lvl: <2 mg/dL — ABNORMAL LOW (ref 2.8–20.0)

## 2013-04-04 MED ORDER — ENOXAPARIN SODIUM 120 MG/0.8ML ~~LOC~~ SOLN
120.0000 mg | Freq: Two times a day (BID) | SUBCUTANEOUS | Status: DC
  Administered 2013-04-04 – 2013-04-07 (×6): 120 mg via SUBCUTANEOUS
  Filled 2013-04-04 (×8): qty 0.8

## 2013-04-04 MED ORDER — LORAZEPAM 1 MG PO TABS: 1.0000 mg | ORAL_TABLET | Freq: Three times a day (TID) | ORAL | Status: DC | PRN

## 2013-04-04 MED ORDER — INSULIN ASPART 100 UNIT/ML ~~LOC~~ SOLN
0.0000 [IU] | Freq: Three times a day (TID) | SUBCUTANEOUS | Status: DC
  Administered 2013-04-04: 11 [IU] via SUBCUTANEOUS
  Administered 2013-04-05: 4 [IU] via SUBCUTANEOUS
  Administered 2013-04-05: 11 [IU] via SUBCUTANEOUS
  Administered 2013-04-05: 7 [IU] via SUBCUTANEOUS
  Administered 2013-04-06 (×2): 11 [IU] via SUBCUTANEOUS
  Administered 2013-04-06: 4 [IU] via SUBCUTANEOUS
  Administered 2013-04-07: 7 [IU] via SUBCUTANEOUS
  Administered 2013-04-07: 11 [IU] via SUBCUTANEOUS
  Administered 2013-04-07: 7 [IU] via SUBCUTANEOUS

## 2013-04-04 MED ORDER — ENOXAPARIN SODIUM 60 MG/0.6ML ~~LOC~~ SOLN: 60.0000 mg | Freq: Two times a day (BID) | SUBCUTANEOUS | Status: DC

## 2013-04-04 MED ORDER — MAGNESIUM HYDROXIDE 400 MG/5ML PO SUSP: 30.0000 mL | Freq: Every day | ORAL | Status: DC | PRN

## 2013-04-04 MED ORDER — METFORMIN HCL 500 MG PO TABS
1000.0000 mg | ORAL_TABLET | Freq: Two times a day (BID) | ORAL | Status: DC
  Administered 2013-04-04 – 2013-04-07 (×7): 1000 mg via ORAL
  Filled 2013-04-04 (×8): qty 2

## 2013-04-04 MED ORDER — GABAPENTIN 300 MG PO CAPS
300.0000 mg | ORAL_CAPSULE | Freq: Three times a day (TID) | ORAL | Status: DC
  Administered 2013-04-04 – 2013-04-07 (×11): 300 mg via ORAL
  Filled 2013-04-04 (×13): qty 1

## 2013-04-04 MED ORDER — ZOLPIDEM TARTRATE 5 MG PO TABS
5.0000 mg | ORAL_TABLET | Freq: Every evening | ORAL | Status: DC | PRN
  Administered 2013-04-05: 5 mg via ORAL
  Filled 2013-04-04: qty 1

## 2013-04-04 MED ORDER — AMPHETAMINE-DEXTROAMPHET ER 10 MG PO CP24
10.0000 mg | ORAL_CAPSULE | Freq: Every day | ORAL | Status: DC
  Administered 2013-04-04 – 2013-04-07 (×4): 10 mg via ORAL
  Filled 2013-04-04 (×4): qty 1

## 2013-04-04 MED ORDER — GABAPENTIN 300 MG PO CAPS
300.0000 mg | ORAL_CAPSULE | Freq: Three times a day (TID) | ORAL | Status: DC
  Administered 2013-04-04: 300 mg via ORAL
  Filled 2013-04-04 (×3): qty 1

## 2013-04-04 MED ORDER — HYDROXYZINE HCL 50 MG PO TABS
50.0000 mg | ORAL_TABLET | Freq: Every evening | ORAL | Status: DC | PRN
  Filled 2013-04-04: qty 1

## 2013-04-04 MED ORDER — LINAGLIPTIN 5 MG PO TABS
5.0000 mg | ORAL_TABLET | Freq: Every day | ORAL | Status: DC
  Administered 2013-04-05 – 2013-04-07 (×3): 5 mg via ORAL
  Filled 2013-04-04 (×4): qty 1

## 2013-04-04 MED ORDER — ASPIRIN-ACETAMINOPHEN-CAFFEINE 250-250-65 MG PO TABS
1.0000 | ORAL_TABLET | Freq: Four times a day (QID) | ORAL | Status: DC | PRN
  Administered 2013-04-06 (×2): 1 via ORAL
  Filled 2013-04-04 (×3): qty 1

## 2013-04-04 MED ORDER — SIMVASTATIN 5 MG PO TABS
5.0000 mg | ORAL_TABLET | Freq: Every day | ORAL | Status: DC
  Administered 2013-04-04: 5 mg via ORAL
  Filled 2013-04-04 (×2): qty 1

## 2013-04-04 MED ORDER — METFORMIN HCL 500 MG PO TABS
1000.0000 mg | ORAL_TABLET | Freq: Two times a day (BID) | ORAL | Status: DC
  Administered 2013-04-04: 1000 mg via ORAL
  Filled 2013-04-04 (×3): qty 2

## 2013-04-04 MED ORDER — INSULIN GLARGINE 100 UNIT/ML ~~LOC~~ SOLN
40.0000 [IU] | Freq: Every day | SUBCUTANEOUS | Status: DC
  Filled 2013-04-04: qty 0.4

## 2013-04-04 MED ORDER — ACETAMINOPHEN 325 MG PO TABS: 650.0000 mg | ORAL_TABLET | ORAL | Status: DC | PRN

## 2013-04-04 MED ORDER — IBUPROFEN 200 MG PO TABS: 600.0000 mg | ORAL_TABLET | Freq: Three times a day (TID) | ORAL | Status: DC | PRN

## 2013-04-04 MED ORDER — LEVETIRACETAM 500 MG PO TABS
500.0000 mg | ORAL_TABLET | Freq: Two times a day (BID) | ORAL | Status: DC
  Administered 2013-04-04 – 2013-04-07 (×7): 500 mg via ORAL
  Filled 2013-04-04 (×8): qty 1

## 2013-04-04 MED ORDER — ALUM & MAG HYDROXIDE-SIMETH 200-200-20 MG/5ML PO SUSP: 30.0000 mL | ORAL | Status: DC | PRN

## 2013-04-04 MED ORDER — INSULIN ASPART 100 UNIT/ML ~~LOC~~ SOLN
0.0000 [IU] | Freq: Every day | SUBCUTANEOUS | Status: DC
  Administered 2013-04-04 – 2013-04-05 (×2): 2 [IU] via SUBCUTANEOUS

## 2013-04-04 MED ORDER — VILAZODONE HCL 40 MG PO TABS
40.0000 mg | ORAL_TABLET | Freq: Every day | ORAL | Status: DC
  Administered 2013-04-04: 40 mg via ORAL
  Filled 2013-04-04: qty 1

## 2013-04-04 MED ORDER — VILAZODONE HCL 40 MG PO TABS
40.0000 mg | ORAL_TABLET | Freq: Every day | ORAL | Status: DC
  Administered 2013-04-04 – 2013-04-07 (×4): 40 mg via ORAL
  Filled 2013-04-04 (×5): qty 1

## 2013-04-04 MED ORDER — ONDANSETRON 4 MG PO TBDP: 4.0000 mg | ORAL_TABLET | Freq: Three times a day (TID) | ORAL | Status: DC | PRN

## 2013-04-04 MED ORDER — GLUCOSE BLOOD VI STRP: ORAL_STRIP | Status: DC

## 2013-04-04 MED ORDER — ENOXAPARIN SODIUM 120 MG/0.8ML ~~LOC~~ SOLN
115.0000 mg | Freq: Two times a day (BID) | SUBCUTANEOUS | Status: DC
  Administered 2013-04-04: 115 mg via SUBCUTANEOUS
  Filled 2013-04-04 (×3): qty 0.8

## 2013-04-04 MED ORDER — ACETAMINOPHEN 325 MG PO TABS: 650.0000 mg | ORAL_TABLET | Freq: Four times a day (QID) | ORAL | Status: DC | PRN

## 2013-04-04 MED ORDER — LEVETIRACETAM 500 MG PO TABS
500.0000 mg | ORAL_TABLET | Freq: Two times a day (BID) | ORAL | Status: DC
  Administered 2013-04-04: 500 mg via ORAL
  Filled 2013-04-04 (×3): qty 1

## 2013-04-04 MED ORDER — AMPHETAMINE-DEXTROAMPHET ER 10 MG PO CP24
10.0000 mg | ORAL_CAPSULE | Freq: Every day | ORAL | Status: DC
  Administered 2013-04-04: 10 mg via ORAL
  Filled 2013-04-04: qty 1

## 2013-04-04 MED ORDER — TRAZODONE HCL 50 MG PO TABS
50.0000 mg | ORAL_TABLET | Freq: Every evening | ORAL | Status: DC | PRN
Start: 2013-04-04 — End: 2013-04-04

## 2013-04-04 MED ORDER — INSULIN GLARGINE 100 UNIT/ML ~~LOC~~ SOLN
40.0000 [IU] | Freq: Every day | SUBCUTANEOUS | Status: DC
  Administered 2013-04-04 – 2013-04-06 (×3): 40 [IU] via SUBCUTANEOUS
  Filled 2013-04-04: qty 0.4
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
## 2013-04-04 NOTE — BH Assessment (Signed)
BHH Assessment Progress Note   Pt has been accepted to [facility] 404-2 by Donell Sievert, PA to Dr. Jannifer Franklin.  Nurse Boykin Reaper was notified and will pass along to on-coming shift that patient can come over after 08:00.  On-coming shift to call [employer] after 07:00 since patient is on IVC.  This clinician did call Dr. Denton Lank [facility] EDP) and notified him of patient being accepted.

## 2013-04-04 NOTE — ED Notes (Signed)
Patient's [place] Driver's License put in the back pocket of his beige shorts.

## 2013-04-04 NOTE — ED Notes (Signed)
Tele-assessment done by Camelia Eng, Veterinary surgeon.

## 2013-04-04 NOTE — Progress Notes (Signed)
Patient admitted to [facility] from [facility]. Patient currently having passive SI but verbally contracts for safety. The patient is positive for auditory hallucinations and reports hearing "whispering voices." The patient denies HI and visual hallucinations. The patient states that he is here "because of the voices." The patient reports having auditory hallucinations for the past few weeks and states that this is "normal" for him but that they have "gotten worse" and are trying to tell him "what to do." Patient reported being recently discharged from the facility and states that he wishes he were "on the 500 hall." The patient was given education regarding unit rules and regulations. The patient was given education regarding his fall risk status (medium). The patient was escorted to unit by RN and MHT. Will continue to monitor patient for safety.

## 2013-04-04 NOTE — Progress Notes (Signed)
Pt observed in the dayroom watching TV earlier.  Pt appears very flat/sad.  Pt reports he feels hopeless and is worried about where he will go when he is discharged because he cannot return home with his mother.   He was told today that a shelter would probably be his only option.  He wants to find a group home, so that he won't be alone.  Pt is frustrated that he is back in [facility] after such a short time(2 weeks).  He just wants to be able to function outside of a facility.  PT denies HI.  He says he still has passive thoughts to kill himself, but can contract for safety.  He is still hearing voices at times.  Pt was encouraged to monitor what he eats as his CBG was 300 before dinner.  Pt voices understanding.  Pt makes his needs known to staff.  Support and encouragement offered.  Safety maintained with q15 minute checks.

## 2013-04-04 NOTE — Progress Notes (Signed)
Adult Psychoeducational Group Note  Date:  04/04/2013 Time:  8:34 PM  Group Topic/Focus:  Wrap-Up Group:   The focus of this group is to help patients review their daily goal of treatment and discuss progress on daily workbooks.  Participation Level:  Minimal  Participation Quality:  Appropriate and Inattentive  Affect:  Appropriate  Cognitive:  Appropriate  Insight: Appropriate  Engagement in Group:  Engaged and Improving  Modes of Intervention:  Support  Additional Comments:  Pt says his goal for today was to talk to his daughter, which he did achieve. He brightened up when he was able to talk about her. He seemed very proud of her and he spoke about her achievements.   Miking Usrey 04/04/2013, 8:34 PM

## 2013-04-04 NOTE — ED Notes (Signed)
Report called to Financial controller at [facility]. Accepted to room 404-2 by Dr.Akintayo. [employer] will be called to transport.
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
## 2013-04-04 NOTE — BHH Suicide Risk Assessment (Signed)
Suicide Risk Assessment  Admission Assessment     Nursing information obtained from:    Demographic factors:    Current Mental Status:    Loss Factors:    Historical Factors:    Risk Reduction Factors:     CLINICAL FACTORS:   Depression:   Anhedonia Hopelessness Impulsivity Insomnia Currently Psychotic Previous Psychiatric Diagnoses and Treatments  COGNITIVE FEATURES THAT CONTRIBUTE TO RISK:  Closed-mindedness Polarized thinking    SUICIDE RISK:   Minimal: No identifiable suicidal ideation.  Patients presenting with no risk factors but with morbid ruminations; may be classified as minimal risk based on the severity of the depressive symptoms  PLAN OF CARE:1. Admit for crisis management and stabilization. 2. Medication management to reduce current symptoms to base line and improve the     patient's overall level of functioning 3. Treat health problems as indicated. 4. Develop treatment plan to decrease risk of relapse upon discharge and the need for     readmission. 5. Psycho-social education regarding relapse prevention and self care. 6. Health care follow up as needed for medical problems. 7. Restart home medications where appropriate.   I certify that inpatient services furnished can reasonably be expected to improve the patient's condition.  Marlon Vonruden,MD 04/04/2013, 11:24 AM

## 2013-04-04 NOTE — Progress Notes (Signed)
ANTICOAGULATION CONSULT NOTE - Initial Consult  Pharmacy Consult for Lovenox Indication: H/O DVT/PE and clot on coumadin, Home med  Allergies  Allergen Reactions  . Penicillins Other (See Comments)    "childhood reaction"    Patient Measurements: Height: 5\' 11"  (180.3 cm) Weight: 254 lb (115.214 kg) IBW/kg (Calculated) : 75.3   Vital Signs: Temp: 98 F (36.7 C) (09/03 1051) Temp src: Oral (09/03 1051) BP: 109/72 mmHg (09/03 1052) Pulse Rate: 98 (09/03 1052)  Labs:  Recent Labs  04/04/13 0003  HGB 14.6  HCT 43.0  PLT 334  CREATININE 0.83    Estimated Creatinine Clearance: 151.3 ml/min (by C-G formula based on Cr of 0.83).   Medical History: Past Medical History  Diagnosis Date  . Asthma   . Depression   . Diabetes   . Headache(784.0)     frequent  . Migraines   . History of blood clots   . Hyperlipidemia   . Obesity   . Sleep apnea   . DVT (deep venous thrombosis), hx of recurrent 05/23/2012  . History of blood clot in brain, 2012 - followed by [facility] 05/23/2012  . Seizure disorder - followed by [facility] 05/23/2012  . Hyperlipemia 08/25/2012  . Anxiety   . Tuberculosis   . Pneumonia   . Kidney disease   . Kidney stones   . Seizure     Medications:  Scheduled:  . amphetamine-dextroamphetamine  10 mg Oral Daily  . enoxaparin (LOVENOX) injection  120 mg Subcutaneous BID  . gabapentin  300 mg Oral TID  . insulin glargine  40 Units Subcutaneous QHS  . levETIRAcetam  500 mg Oral BID  . [START ON 04/05/2013] linagliptin  5 mg Oral QAC breakfast  . metFORMIN  1,000 mg Oral BID WC  . simvastatin  5 mg Oral q1800  . Vilazodone HCl  40 mg Oral Daily    Assessment: Patient has had several clots including bilateral PE and clots while therapuetic on coumadin.  lovenox prescribed as home therapy for anticoagulation.  Patient aware of medication and had no question for me regarding therapy.   Goal of Therapy:  Anticoagulation   Plan:   Lovenox 120 mg SQ q12 hours. Will monitor patient for bleeding and PLTC    Bryan Mcmillan 04/04/2013,2:21 PM

## 2013-04-04 NOTE — Tx Team (Signed)
  Interdisciplinary Treatment Plan Update   Date Reviewed:  04/04/2013  Time Reviewed:  3:34 PM  Progress in Treatment:   Attending groups: Yes Participating in groups: Yes Taking medication as prescribed: Yes  Tolerating medication: Yes Family/Significant other contact made: No Patient understands diagnosis: Yes As evidenced by asking for help with mood stabilization;  Presented with both HI and SI at admission Discussing patient identified problems/goals with staff: Yes Medical problems stabilized or resolved: Yes Denies suicidal/homicidal ideation: Yes  In tx team Patient has not harmed self or others: Yes  For review of initial/current patient goals, please see plan of care.  Estimated Length of Stay:  4-5 days  Reason for Continuation of Hospitalization: Depression Hallucinations Medication stabilization  New Problems/Goals identified:  N/A  Discharge Plan or Barriers:   unknown  Additional Comments: Patient admitted to [facility] from [facility]. Patient currently having passive SI but verbally contracts for safety. The patient is positive for auditory hallucinations and reports hearing "whispering voices." The patient denies HI and visual hallucinations. The patient states that he is here "because of the voices." The patient reports having auditory hallucinations for the past few weeks and states that this is "normal" for him but that they have "gotten worse" and are trying to tell him "what to do."     Attendees:  Signature: Thedore Mins, MD 04/04/2013 3:34 PM   Signature: Richelle Ito, LCSW 04/04/2013 3:34 PM  Signature: Fransisca Kaufmann, NP 04/04/2013 3:34 PM  Signature: Nestor Ramp, RN 04/04/2013 3:34 PM  Signature: Liborio Nixon, RN 04/04/2013 3:34 PM  Signature:  04/04/2013 3:34 PM  Signature:   04/04/2013 3:34 PM  Signature:    Signature:    Signature:    Signature:    Signature:    Signature:      Scribe for Treatment Team:   Richelle Ito, LCSW  04/04/2013 3:34 PM

## 2013-04-04 NOTE — [facility] Group Notes (Signed)
[facility] Group Therapy  04/04/2013 , 1:24 PM    Type of Therapy:  [facility] Presentation  Participation Level:  None  Participation Quality:  Attentive  Affect:  Depressed  Cognitive:  Oriented  Insight:  Limited  Engagement in Therapy:  none  Modes of Intervention:  Discussion, Education and Socialization  Summary of Progress/Problems:  Bryan Mcmillan from [facility] came to present his recovery story and play the guitar.  Bryan Mcmillan was called out early to see the NP.  He returned, but was quiet.  Very sad looking.  [name] 04/04/2013 , 1:24 PM

## 2013-04-04 NOTE — ED Provider Notes (Signed)
CSN: [id number]     Arrival date & time 04/03/13  2313 History   First MD Initiated Contact with Patient 04/03/13 2353     Chief Complaint  Patient presents with  . Medical Clearance   (Consider location/radiation/quality/duration/timing/severity/associated sxs/prior Treatment) The history is provided by the patient.  pt with hx anxiety and depression, states became very upset after argument/disagreement w family member tonight. States he just wanted family member to let go of the issue, but she wouldn't, which made him feel trapped, anxious, and increasingly frustrated. Pt then states had thoughts of hurting self w needle, and/or harming family member. Denies any attempt at harm to self or others. States [facility], states was told these symptoms are driven by anxiety. States compliant w normal meds, and infact states had great day today prior to argument, was feeling much better. States compliant w his normal meds, and states physical health at baseline.  Pt states since in ed has been using the relaxation exercises he recently learned, and is starting to feel better. Denies wanting any med currently for anxiety.     Past Medical History  Diagnosis Date  . Asthma   . Depression   . Diabetes   . Headache(784.0)     frequent  . Kidney disease   . Kidney stones   . Migraines   . History of blood clots   . Seizure   . Hyperlipidemia   . Obesity   . Sleep apnea   . DVT (deep venous thrombosis), hx of recurrent 05/23/2012  . History of blood clot in brain, 2012 - followed by [facility] 05/23/2012  . Seizure disorder - followed by [facility] 05/23/2012  . Hyperlipemia 08/25/2012  . Anxiety   . Tuberculosis   . Pneumonia    Past Surgical History  Procedure Laterality Date  . Brain surgery    . Filter for blood clots     Family History  Problem Relation Age of Onset  . Hyperlipidemia      parent  . Heart disease      parent  . Stroke     parent  . Diabetes      grandparent /parent  . Obesity Other   . Heart attack Other    History  Substance Use Topics  . Smoking status: Former Games developer  . Smokeless tobacco: Not on file  . Alcohol Use: No    Review of Systems  Constitutional: Negative for fever and chills.  HENT: Negative for neck pain.   Eyes: Negative for redness.  Respiratory: Negative for shortness of breath.   Cardiovascular: Negative for chest pain.  Gastrointestinal: Negative for abdominal pain.  Genitourinary: Negative for flank pain.  Musculoskeletal: Negative for back pain.  Skin: Negative for rash.  Neurological: Negative for headaches.  Hematological: Does not bruise/bleed easily.  Psychiatric/Behavioral: Positive for dysphoric mood. Negative for confusion. The patient is nervous/anxious.     Allergies  Penicillins  Home Medications   Current Outpatient Rx  Name  Route  Sig  Dispense  Refill  . amphetamine-dextroamphetamine (ADDERALL XR) 10 MG 24 hr capsule   Oral   Take 1 capsule (10 mg total) by mouth daily. For ADHD      0   . aspirin-acetaminophen-caffeine (EXCEDRIN MIGRAINE) 250-250-65 MG per tablet   Oral   Take 1 tablet by mouth every 6 (six) hours as needed for pain. Headaches   30 tablet   0   . enoxaparin (LOVENOX)  150 MG/ML injection   Subcutaneous   Inject 1 mL (150 mg total) into the skin every 12 (twelve) hours. For blood clots   0 Syringe      . gabapentin (NEURONTIN) 300 MG capsule   Oral   Take 1 capsule (300 mg total) by mouth 3 (three) times daily. For anxiety   90 capsule   0   . hydrOXYzine (ATARAX/VISTARIL) 25 MG tablet   Oral   Take 1 tablet (25 mg total) by mouth every 4 (four) hours as needed for anxiety (Sleep).   60 tablet   0   . insulin glargine (LANTUS) 100 UNIT/ML injection   Subcutaneous   Inject 0.4 mLs (40 Units total) into the skin at bedtime. For diabetes management   10 mL   12   . levETIRAcetam (KEPPRA) 500 MG tablet   Oral   Take  1 tablet (500 mg total) by mouth 2 (two) times daily. For seizure activities   180 tablet   1   . metFORMIN (GLUCOPHAGE) 1000 MG tablet   Oral   Take 1 tablet (1,000 mg total) by mouth 2 (two) times daily with a meal. For diabetes management   180 tablet   3   . pravastatin (PRAVACHOL) 80 MG tablet   Oral   Take 1 tablet (80 mg total) by mouth daily. For high cholesterol control   90 tablet   3   . sitaGLIPtin (JANUVIA) 100 MG tablet   Oral   Take 1 tablet (100 mg total) by mouth daily. Before breakfast: For diabetes control   30 tablet   3   . traZODone (DESYREL) 50 MG tablet   Oral   Take 1 tablet (50 mg total) by mouth at bedtime and may repeat dose one time if needed. For sleep   60 tablet   0   . Vilazodone HCl (VIIBRYD) 40 MG TABS   Oral   Take 1 tablet (40 mg total) by mouth daily. For dpression   30 tablet   0    BP 116/74  Pulse 104  Temp(Src) 98.5 F (36.9 C) (Oral)  Resp 18  Ht 5\' 11"  (1.803 m)  Wt 254 lb (115.214 kg)  BMI 35.44 kg/m2  SpO2 94% Physical Exam  Nursing note and vitals reviewed. Constitutional: He is oriented to person, place, and time. He appears well-developed and well-nourished. No distress.  HENT:  Head: Atraumatic.  Mouth/Throat: Oropharynx is clear and moist.  Eyes: Conjunctivae are normal. Pupils are equal, round, and reactive to light.  Neck: Neck supple. No tracheal deviation present.  Cardiovascular: Normal rate, regular rhythm, normal heart sounds and intact distal pulses.   Pulmonary/Chest: Effort normal and breath sounds normal. No accessory muscle usage. No respiratory distress.  Abdominal: Soft. Bowel sounds are normal. He exhibits no distension. There is no tenderness.  Musculoskeletal: Normal range of motion. He exhibits no edema and no tenderness.  Neurological: He is alert and oriented to person, place, and time.  Steady gait  Skin: Skin is warm and dry.  Psychiatric:  mildly anxious, otherwise normal mood. Denies  any current thoughts of harming self or others.     ED Course  Procedures (including critical care time) Labs Review  Results for orders placed during the hospital encounter of 04/03/13  ACETAMINOPHEN LEVEL      Result Value Range   Acetaminophen (Tylenol), Serum <15.0  10 - 30 ug/mL  CBC      Result Value Range  WBC 9.5  4.0 - 10.5 K/uL   RBC 5.02  4.22 - 5.81 MIL/uL   Hemoglobin 14.6  13.0 - 17.0 g/dL   HCT 16.1  09.6 - 04.5 %   MCV 85.7  78.0 - 100.0 fL   MCH 29.1  26.0 - 34.0 pg   MCHC 34.0  30.0 - 36.0 g/dL   RDW 40.9  81.1 - 91.4 %   Platelets 334  150 - 400 K/uL  COMPREHENSIVE METABOLIC PANEL      Result Value Range   Sodium 136  135 - 145 mEq/L   Potassium 3.5  3.5 - 5.1 mEq/L   Chloride 100  96 - 112 mEq/L   CO2 27  19 - 32 mEq/L   Glucose, Bld 263 (*) 70 - 99 mg/dL   BUN 16  6 - 23 mg/dL   Creatinine, Ser 7.82  0.50 - 1.35 mg/dL   Calcium 9.7  8.4 - 95.6 mg/dL   Total Protein 7.3  6.0 - 8.3 g/dL   Albumin 3.8  3.5 - 5.2 g/dL   AST 17  0 - 37 U/L   ALT 29  0 - 53 U/L   Alkaline Phosphatase 61  39 - 117 U/L   Total Bilirubin 0.2 (*) 0.3 - 1.2 mg/dL   GFR calc non Af Amer >90  >90 mL/min   GFR calc Af Amer >90  >90 mL/min  ETHANOL      Result Value Range   Alcohol, Ethyl (B) <11  0 - 11 mg/dL  SALICYLATE LEVEL      Result Value Range   Salicylate Lvl <2.0 (*) 2.8 - 20.0 mg/dL  URINE RAPID DRUG SCREEN (HOSP PERFORMED)      Result Value Range   Opiates NONE DETECTED  NONE DETECTED   Cocaine NONE DETECTED  NONE DETECTED   Benzodiazepines NONE DETECTED  NONE DETECTED   Amphetamines POSITIVE (*) NONE DETECTED   Tetrahydrocannabinol NONE DETECTED  NONE DETECTED   Barbiturates NONE DETECTED  NONE DETECTED     MDM  Will get telepsych eval - pt much calmer now, states is starting to feel better, ?de-escalating from prior argument w family member.  Reviewed nursing notes and prior charts for additional history.   [facility] team [facility] Dr  Jannifer Franklin.     Suzi Roots, MD 04/04/13 [phone]

## 2013-04-04 NOTE — ED Notes (Signed)
[employer] notified of transportation needed to [facility]. 

## 2013-04-04 NOTE — BH Assessment (Signed)
Assessment Note  Bryan Mcmillan is a 42 y.o. male who presents via involuntary petition by [employer].  Pt verbalized that he wanted to harm himself and his mother.  Pt has no plan to harm mother but when asked by this writer of his intent with his mother, pt paused for several minutes and replied--"I don't know" and then says--"part of me wants to hurt her and part of me doesn't".  Pt says he and his mother had a dispute due to miscommunication. Pt repeatedly says during interview--"I wish she would understand".  Pt is SI and has several plans to harm self--(1) ingest excessive amounts of rubbing alcohol, (2) inject massive air bubble in chest via syringe and (3) continously stab wrist with a "lovenox" needle. Pt denies any past attempts, thoughts only.   Pt was recently d/c'd from [facility] on 03/27/13 due to SI directly related to work.  Pt was employed as a Administrator, [employer] and was spending excessive amounts of money for travel and meals and taking in any income.  Pt denies SA. Pt reports AVH--hears voices everyday that say--"I don't care" and see images of people.  Pt accepted by Donell Sievert, [place] [phone]  Axis I: MDD, reccurent, sever with psych features  Axis II: Deferred Axis III:  Past Medical History  Diagnosis Date  . Asthma   . Depression   . Diabetes   . Headache(784.0)     frequent  . Migraines   . History of blood clots   . Hyperlipidemia   . Obesity   . Sleep apnea   . DVT (deep venous thrombosis), hx of recurrent 05/23/2012  . History of blood clot in brain, 2012 - followed by [facility] 05/23/2012  . Seizure disorder - followed by [facility] 05/23/2012  . Hyperlipemia 08/25/2012  . Anxiety   . Tuberculosis   . Pneumonia   . Kidney disease   . Kidney stones   . Seizure    Axis IV: economic problems, housing problems, occupational problems, other psychosocial or environmental problems, problems related to social environment and problems with primary support group Axis V:  21-30 behavior considerably influenced by delusions or hallucinations OR serious impairment in judgment, communication OR inability to function in almost all areas  Past Medical History:  Past Medical History  Diagnosis Date  . Asthma   . Depression   . Diabetes   . Headache(784.0)     frequent  . Migraines   . History of blood clots   . Hyperlipidemia   . Obesity   . Sleep apnea   . DVT (deep venous thrombosis), hx of recurrent 05/23/2012  . History of blood clot in brain, 2012 - followed by [facility] 05/23/2012  . Seizure disorder - followed by [facility] 05/23/2012  . Hyperlipemia 08/25/2012  . Anxiety   . Tuberculosis   . Pneumonia   . Kidney disease   . Kidney stones   . Seizure     Past Surgical History  Procedure Laterality Date  . Brain surgery    . Filter for blood clots      Family History:  Family History  Problem Relation Age of Onset  . Hyperlipidemia      parent  . Heart disease      parent  . Stroke      parent  . Diabetes      grandparent /parent  . Obesity Other   . Heart attack Other     Social History:  reports that  he has quit smoking. He does not have any smokeless tobacco history on file. He reports that he does not drink alcohol or use illicit drugs.  Additional Social History:  Alcohol / Drug Use Pain Medications: See MAR  Prescriptions: See MAR  Over the Counter: See MAR  History of alcohol / drug use?: No history of alcohol / drug abuse Longest period of sobriety (when/how long): Pt denies   CIWA: CIWA-Ar BP: 106/69 mmHg Pulse Rate: 76 COWS:    Allergies:  Allergies  Allergen Reactions  . Penicillins Other (See Comments)    "childhood reaction"    Home Medications:  (Not in a hospital admission)  OB/GYN Status:  No LMP for male patient.  General Assessment Data Location of Assessment: [facility] Is this a Tele or Face-to-Face Assessment?: Tele Assessment Is this an Initial Assessment or a Re-assessment for this  encounter?: Initial Assessment Living Arrangements: Parent (Lives with mother ) Can pt return to current living arrangement?: Yes Admission Status: Involuntary Is patient capable of signing voluntary admission?: No Transfer from: Acute Hospital Referral Source: MD  Medical Screening Exam [facility] Walk-in ONLY) Medical Exam completed: No Reason for MSE not completed: Other: (None )  [facility] Crisis Care Plan Living Arrangements: Parent (Lives with mother ) Name of Psychiatrist: None  Name of Therapist: None   Education Status Is patient currently in school?: No Current Grade: None  Highest grade of school patient has completed: bachelor's degree, 2 associates degree Name of school: None  Contact person: None   Risk to self Suicidal Ideation: Yes-Currently Present Suicidal Intent: Yes-Currently Present Is patient at risk for suicide?: Yes Suicidal Plan?: Yes-Currently Present Specify Current Suicidal Plan: Stab self with Hypodermic needle; ingest excessive amount of rubbing alcohol  (Inject massive air bubble in chest via needle ) Access to Means: Yes Specify Access to Suicidal Means: Diabetic syringes What has been your use of drugs/alcohol within the last 12 months?: Pt denies  Previous Attempts/Gestures: No (Thoughts only ) How many times?: 0 Other Self Harm Risks: None  Triggers for Past Attempts: None known Intentional Self Injurious Behavior: None Family Suicide History: No Recent stressful life event(s): Financial Problems;Job Loss;Other (Comment);Conflict (Comment) (Housing; Issues with mother ) Persecutory voices/beliefs?: No Depression: Yes Depression Symptoms: Loss of interest in usual pleasures;Feeling worthless/self pity;Feeling angry/irritable;Despondent;Insomnia Substance abuse history and/or treatment for substance abuse?: No Suicide prevention information given to non-admitted patients: Not applicable  Risk to Others Homicidal Ideation: Yes-Currently  Present Thoughts of Harm to Others: Yes-Currently Present Comment - Thoughts of Harm to Others: Admits thought of self harm to mother  Current Homicidal Intent: Yes-Currently Present Current Homicidal Plan: No Access to Homicidal Means: No Identified Victim: Mother  History of harm to others?: No Assessment of Violence: In past 6-12 months Violent Behavior Description: Aggressiveness towards mother  Does patient have access to weapons?: No Criminal Charges Pending?: No Describe Pending Criminal Charges: None  Does patient have a court date: No Court Date:  (None )  Psychosis Hallucinations: Auditory;Visual Delusions: None noted  Mental Status Report Appear/Hygiene: Disheveled Eye Contact: Good Motor Activity: Unremarkable Speech: Logical/coherent;Soft;Slow Level of Consciousness: Alert Mood: Depressed;Despair;Sad Affect: Depressed;Sad Anxiety Level: None Thought Processes: Coherent;Relevant Judgement: Impaired Orientation: Person;Place;Time;Situation Obsessive Compulsive Thoughts/Behaviors: None  Cognitive Functioning Concentration: Normal Memory: Recent Intact;Remote Intact IQ: Average Insight: Poor Impulse Control: Poor Appetite: Fair Weight Loss: 0 Weight Gain: 0 Sleep: Decreased Total Hours of Sleep: 5 Vegetative Symptoms: None  ADLScreening [facility] Assessment Services) Patient's cognitive ability adequate to  safely complete daily activities?: Yes Patient able to express need for assistance with ADLs?: Yes Independently performs ADLs?: Yes (appropriate for developmental age)  Prior Inpatient Therapy Prior Inpatient Therapy: Yes Prior Therapy Dates: Aug 2014 Prior Therapy Facilty/Provider(s): [facility]  Reason for Treatment: SI/Depression   Prior Outpatient Therapy Prior Outpatient Therapy: Yes Prior Therapy Dates: 2014 Prior Therapy Facilty/Provider(s): Dr Kizzie Bane Reason for Treatment: Med Mgt   ADL Screening (condition at time of admission) Patient's  cognitive ability adequate to safely complete daily activities?: Yes Is the patient deaf or have difficulty hearing?: No Does the patient have difficulty seeing, even when wearing glasses/contacts?: No Does the patient have difficulty concentrating, remembering, or making decisions?: No Patient able to express need for assistance with ADLs?: Yes Does the patient have difficulty dressing or bathing?: No Independently performs ADLs?: Yes (appropriate for developmental age) Does the patient have difficulty walking or climbing stairs?: No Weakness of Legs: None Weakness of Arms/Hands: None  Home Assistive Devices/Equipment Home Assistive Devices/Equipment: None  Therapy Consults (therapy consults require a physician order) PT Evaluation Needed: No OT Evalulation Needed: No SLP Evaluation Needed: No Abuse/Neglect Assessment (Assessment to be complete while patient is alone) Physical Abuse: Denies Verbal Abuse: Denies Sexual Abuse: Denies Exploitation of patient/patient's resources: Denies Self-Neglect: Denies Values / Beliefs Cultural Requests During Hospitalization: None Spiritual Requests During Hospitalization: None Consults Spiritual Care Consult Needed: No Social Work Consult Needed: No Merchant navy officer (For Healthcare) Pre-existing out of facility DNR order (yellow form or pink MOST form): No Nutrition Screen- [facility] Patient's home diet: Regular  Additional Information 1:1 In Past 12 Months?: No CIRT Risk: No Elopement Risk: No Does patient have medical clearance?: Yes     Disposition:  Disposition Initial Assessment Completed for this Encounter: Yes Disposition of Patient: Inpatient treatment program;Referred to (Pt accepted by Donell Sievert, PA [phone]) Type of inpatient treatment program: Adult Patient referred to: Other (Comment) (Pt accepted by Donell Sievert, [place] [phone])  On Site Evaluation by:   Reviewed with Physician:    Murrell Redden 04/04/2013  7:35 AM

## 2013-04-05 LAB — GLUCOSE, CAPILLARY
Glucose-Capillary: 268 mg/dL — ABNORMAL HIGH (ref 70–99)
Glucose-Capillary: 231 mg/dL — ABNORMAL HIGH (ref 70–99)

## 2013-04-05 MED ORDER — ADULT MULTIVITAMIN W/MINERALS CH
1.0000 | ORAL_TABLET | Freq: Every day | ORAL | Status: DC
  Administered 2013-04-05 – 2013-04-07 (×3): 1 via ORAL
  Filled 2013-04-05 (×3): qty 1

## 2013-04-05 MED ORDER — TRAZODONE HCL 50 MG PO TABS
100.0000 mg | ORAL_TABLET | Freq: Every evening | ORAL | Status: DC | PRN
  Administered 2013-04-06: 100 mg via ORAL
  Filled 2013-04-05: qty 1

## 2013-04-05 MED ORDER — HYDROXYZINE HCL 25 MG PO TABS
50.0000 mg | ORAL_TABLET | Freq: Four times a day (QID) | ORAL | Status: DC | PRN
  Administered 2013-04-05 – 2013-04-07 (×2): 50 mg via ORAL

## 2013-04-05 MED ORDER — SIMVASTATIN 10 MG PO TABS
10.0000 mg | ORAL_TABLET | Freq: Every day | ORAL | Status: DC
  Administered 2013-04-05 – 2013-04-07 (×3): 10 mg via ORAL
  Filled 2013-04-05 (×3): qty 1

## 2013-04-05 NOTE — Progress Notes (Addendum)
D: Pt is blunted in affect and depressed in mood. Pt was tearful this evening. Pt was not willing to come out of his room and participate within the milieu. Pt was tearful and was complaining of chest pain. Pt was exhibited to be experiencing an anxiety attack. AC present with Clinical research associate to verbally deescalate pt's anxiety. Vitals obtained WDL .With lots of encouragement he took his medications. Overall this pt verbalized feelings of hopelessness. He feels like no one is on his side. He believes that everyone only believes what his mother says about him.Pt was passive SI. Pt verbally contracts for safety. A: Writer administered scheduled and prn medications to pt. Continued support and availability as needed was extended to this pt. Staff continue to monitor pt with q29min checks.  R: No adverse drug reactions noted. Pt receptive to treatment. Pt remains safe at this time.

## 2013-04-05 NOTE — Progress Notes (Signed)
D: Patient having passive SI but verbally contracts for safety. The patient is reporting auditory hallucinations and states that the voices "off and on." The patient denies HI and visual hallucinations at this time. The patient has an anxious and depressed mood and affect. The patient was tearful during interaction with RN and stated that he is depressed because his brain surgery caused him to develop aphasia. The patient began shaking his hands and motioning them to communicate. Patient was observed on unit interaction with other patients and staff with no problems.  A: Patient given emotional support from RN. Patient encouraged to come to staff with concerns and/or questions. Patient's medication routine continued. Patient's orders and plan of care reviewed.  R: Patient remains cooperative. Will continue to monitor patient q15 minutes for safety.

## 2013-04-05 NOTE — Progress Notes (Signed)
[facility] MD Progress Note  04/05/2013 10:52 AM Bryan Mcmillan  MRN:  [id number] Subjective: " I am feeling really anxious today." Objective: Patient reports ongoing depressive symptoms, anxiety, excessive worries, low energy level, lack of motivation and difficulty sleeping. He is compliant with his medications and has not endorsed ant adverse reactions. Diagnosis:   DSM5: Schizophrenia Disorders:   Obsessive-Compulsive Disorders:   Trauma-Stressor Disorders:   Substance/Addictive Disorders:   Depressive Disorders:  Major Depressive Disorder - Severe (296.23)  Axis I: Major Depression, Recurrent severe  ADL's:  Intact  Sleep: Fair  Appetite:  Fair  Suicidal Ideation: denies  Homicidal Ideation: yes  AEB (as evidenced by):  Psychiatric Specialty Exam: Review of Systems  Constitutional: Negative.   HENT: Negative.   Eyes: Negative.   Respiratory: Negative.   Cardiovascular: Negative.   Gastrointestinal: Negative.   Genitourinary: Negative.   Musculoskeletal: Negative.   Skin: Negative.   Neurological: Positive for tremors.  Endo/Heme/Allergies: Negative.   Psychiatric/Behavioral: Positive for depression. The patient is nervous/anxious and has insomnia.     Blood pressure 117/75, pulse 94, temperature 98 F (36.7 C), temperature source Oral, resp. rate 16, height 5\' 11"  (1.803 m), weight 115.214 kg (254 lb), SpO2 95.00%.Body mass index is 35.44 kg/(m^2).  General Appearance: Fairly Groomed  Patent attorney::  Fair  Speech:  Clear and Coherent  Volume:  Normal  Mood:  Anxious and Depressed  Affect:  Constricted  Thought Process:  Goal Directed  Orientation:  Full (Time, Place, and Person)  Thought Content:  NA  Suicidal Thoughts:  No  Homicidal Thoughts:  Yes.  without intent/plan  Memory:  Immediate;   Fair Recent;   Fair Remote;   Fair  Judgement:  Poor  Insight:  Lacking  Psychomotor Activity:  Decreased  Concentration:  Poor  Recall:  Fair  Akathisia:  No   Handed:  Right  AIMS (if indicated):     Assets:  Communication Skills Desire for Improvement Social Support  Sleep:  Number of Hours: 5.75   Current Medications: Current Facility-Administered Medications  Medication Dose Route Frequency Provider Last Rate Last Dose  . acetaminophen (TYLENOL) tablet 650 mg  650 mg Oral Q6H PRN Earney Navy, NP      . alum & mag hydroxide-simeth (MAALOX/MYLANTA) 200-200-20 MG/5ML suspension 30 mL  30 mL Oral Q4H PRN Earney Navy, NP      . amphetamine-dextroamphetamine (ADDERALL XR) 24 hr capsule 10 mg  10 mg Oral Daily Earney Navy, NP   10 mg at 04/05/13 0727  . aspirin-acetaminophen-caffeine (EXCEDRIN MIGRAINE) per tablet 1 tablet  1 tablet Oral Q6H PRN Earney Navy, NP      . enoxaparin (LOVENOX) injection 120 mg  120 mg Subcutaneous BID Osmani Kersten   120 mg at 04/05/13 0728  . gabapentin (NEURONTIN) capsule 300 mg  300 mg Oral TID Earney Navy, NP   300 mg at 04/05/13 0727  . hydrOXYzine (ATARAX/VISTARIL) tablet 50 mg  50 mg Oral Q6H PRN Nirav Sweda      . ibuprofen (ADVIL,MOTRIN) tablet 600 mg  600 mg Oral Q8H PRN Earney Navy, NP      . insulin aspart (novoLOG) injection 0-20 Units  0-20 Units Subcutaneous TID WC Fransisca Kaufmann, NP   4 Units at 04/05/13 [phone]  . insulin aspart (novoLOG) injection 0-5 Units  0-5 Units Subcutaneous QHS Fransisca Kaufmann, NP   2 Units at 04/04/13 2129  . insulin glargine (LANTUS) injection 40 Units  40 Units  Subcutaneous QHS Earney Navy, NP   40 Units at 04/04/13 2129  . levETIRAcetam (KEPPRA) tablet 500 mg  500 mg Oral BID Earney Navy, NP   500 mg at 04/05/13 0728  . linagliptin (TRADJENTA) tablet 5 mg  5 mg Oral QAC breakfast Earney Navy, NP   5 mg at 04/05/13 0648  . magnesium hydroxide (MILK OF MAGNESIA) suspension 30 mL  30 mL Oral Daily PRN Earney Navy, NP      . metFORMIN (GLUCOPHAGE) tablet 1,000 mg  1,000 mg Oral BID WC Earney Navy, NP   1,000  mg at 04/05/13 0728  . multivitamin with minerals tablet 1 tablet  1 tablet Oral Daily Holbert Caples      . ondansetron (ZOFRAN-ODT) disintegrating tablet 4 mg  4 mg Oral Q8H PRN Earney Navy, NP      . simvastatin (ZOCOR) tablet 10 mg  10 mg Oral q1800 Chester Sibert      . traZODone (DESYREL) tablet 100 mg  100 mg Oral QHS PRN Laaibah Wartman      . Vilazodone HCl (VIIBRYD) TABS 40 mg  40 mg Oral Daily Earney Navy, NP   40 mg at 04/05/13 1610    Lab Results:  Results for orders placed during the hospital encounter of 04/04/13 (from the past 48 hour(s))  GLUCOSE, CAPILLARY     Status: Abnormal   Collection Time    04/04/13 11:51 AM      Result Value Range   Glucose-Capillary 182 (*) 70 - 99 mg/dL  GLUCOSE, CAPILLARY     Status: Abnormal   Collection Time    04/04/13  4:52 PM      Result Value Range   Glucose-Capillary 300 (*) 70 - 99 mg/dL  GLUCOSE, CAPILLARY     Status: Abnormal   Collection Time    04/04/13  8:37 PM      Result Value Range   Glucose-Capillary 235 (*) 70 - 99 mg/dL  GLUCOSE, CAPILLARY     Status: Abnormal   Collection Time    04/05/13  6:13 AM      Result Value Range   Glucose-Capillary 190 (*) 70 - 99 mg/dL    Physical Findings: AIMS: Facial and Oral Movements Muscles of Facial Expression: None, normal Lips and Perioral Area: None, normal Jaw: None, normal Tongue: None, normal,Extremity Movements Upper (arms, wrists, hands, fingers): None, normal Lower (legs, knees, ankles, toes): None, normal, Trunk Movements Neck, shoulders, hips: None, normal, Overall Severity Severity of abnormal movements (highest score from questions above): None, normal Incapacitation due to abnormal movements: None, normal Patient's awareness of abnormal movements (rate only patient's report): No Awareness, Dental Status Current problems with teeth and/or dentures?: No Does patient usually wear dentures?: No  CIWA:    COWS:     Treatment Plan Summary: Daily  contact with patient to assess and evaluate symptoms and progress in treatment Medication management  Plan:1. Admit for crisis management and stabilization. 2. Medication management to reduce current symptoms to base line and improve the     patient's overall level of functioning 3. Treat health problems as indicated. 4. Develop treatment plan to decrease risk of relapse upon discharge and the need for     readmission. 5. Psycho-social education regarding relapse prevention and self care. 6. Health care follow up as needed for medical problems. 7. Restart home medications where appropriate. 8. Continue medication regimen.   Medical Decision Making Problem Points:  Established problem, stable/improving (  1), Review of last therapy session (1) and Review of psycho-social stressors (1) Data Points:  Order Aims Assessment (2) Review of medication regiment & side effects (2) Review of new medications or change in dosage (2)  I certify that inpatient services furnished can reasonably be expected to improve the patient's condition.   Lovada Barwick,MD  04/05/2013, 10:52 AM

## 2013-04-05 NOTE — [facility] Group Notes (Signed)
[facility] Group Notes:  (Counselor/Nursing/MHT/Case Management/Adjunct)  04/05/2013 1:15PM  Type of Therapy:  Group Therapy  Participation Level:  Active  Participation Quality:  Appropriate  Affect:  Flat  Cognitive:  Oriented  Insight:  Improving  Engagement in Group:  Limited  Engagement in Therapy:  Limited  Modes of Intervention:  Discussion, Exploration and Socialization  Summary of Progress/Problems: The topic for group was balance in life.  Pt participated in the discussion about when their life was in balance and out of balance and how this feels.  Pt discussed ways to get back in balance and short term goals they can work on to get where they want to be. Bryan Mcmillan said that he feels imbalanced.  He mentioned that he felt scared and nervous when another patient was yelling this morning.  It reminded him how his mother would react.  He has sought help with this issue with the staff members and was told to stay away from the patient.  His D/C plan is also unbalancing as he is constantly thinking about it since he does not know where he will be going, and tries to keep his mind occupied with talking to staff members and writing in his notebook.  He indicated that his anxiety is due to not trusting people.  When asked about how to become balanced, he said that he talks to his daughter "my center point" and thinks about working on his novel.     [name] 04/05/2013 2:08 PM

## 2013-04-05 NOTE — [facility] Suicide Risk Assessment (Addendum)
[facility] INPATIENT:  Family/Significant Other Suicide Prevention Education  Suicide Prevention Education:  Education Completed;Bryan Mcmillan, church Bryan Mcmillan, Bryan Mcmillan has been identified by the patient as the family member/significant other with whom the patient will be residing, and identified as the person(s) who will aid the patient in the event of a mental health crisis (suicidal ideations/suicide attempt).  With written consent from the patient, the family member/significant other has been provided the following suicide prevention education, prior to the and/or following the discharge of the patient.  The suicide prevention education provided includes the following:  Suicide risk factors  Suicide prevention and interventions  National Suicide Hotline telephone number  [facility] assessment telephone number  [facility] Emergency Assistance 911  [facility] and/or Residential Mobile Crisis Unit telephone number  Request made of family/significant other to:  Remove weapons (e.g., guns, rifles, knives), all items previously/currently identified as safety concern.    Remove drugs/medications (over-the-counter, prescriptions, illicit drugs), all items previously/currently identified as a safety concern.  The family member/significant other verbalizes understanding of the suicide prevention education information provided.  The family member/significant other agrees to remove the items of safety concern listed above.  Bryan Mcmillan B 04/05/2013, 11:01 AM

## 2013-04-06 LAB — GLUCOSE, CAPILLARY: Glucose-Capillary: 194 mg/dL — ABNORMAL HIGH (ref 70–99)

## 2013-04-06 NOTE — Progress Notes (Signed)
D- Patient has been out on the unit interacting with peers and attending groups.  He rates his depression and hopelessness at 10, but affect is incongruent with stated mood.  Confirmed SI but verbally contracts for safety.  Reports low energy and "poor" appetite.  A- Support and encouragement given.  Continue POC and evaluation of treatment goals.  Continue to assess thoughts of self-harm and 15' checks for safety.  R- Patient remains safe.

## 2013-04-06 NOTE — [facility] Group Notes (Signed)
[facility] LCSW Group Therapy  04/06/2013  1:05 PM  Type of Therapy:  Group therapy  Participation Level:  None  Participation Quality:  Drowsy  Affect:  Flat  Cognitive:  Oriented  Insight:  Limited  Engagement in Therapy:  None  Modes of Intervention:  Discussion, Socialization  Summary of Progress/Problems:  Chaplain was here to lead a group on themes of hope and courage. Bryan Mcmillan was in bed prior to group, and reluctant to join Bryan Mcmillan.  He sat down and promptly fell asleep, or at least kept his eyes closed the entire time so as to not have to participate.  Did not participate. Bryan Mcmillan B 04/06/2013 1:37 PM

## 2013-04-06 NOTE — Progress Notes (Signed)
Patient ID: Bryan Mcmillan, male   DOB: [birth year], 42 y.o.   MRN: [id number] [facility] MD Progress Note  04/06/2013 11:30 AM Bryan Mcmillan  MRN:  [id number] Subjective: Patient states "I am stressed about my living situation. I don't have any hope it will work out. My depression is a nine. I have thoughts of trying to choke myself. The nurses helped me out last night work through it. Hour by hour things just seem more pointless. I'm not eating well and just feel depressed." Objective: Patient reports ongoing depressive symptoms, anxiety, excessive worries, low energy level, lack of motivation and difficulty sleeping. He is compliant with his medications and has not endorsed ant adverse reactions. Patient is observed to be very depressed and hopeless.  Diagnosis:   DSM5: Schizophrenia Disorders:   Obsessive-Compulsive Disorders:   Trauma-Stressor Disorders:   Substance/Addictive Disorders:   Depressive Disorders:  Major Depressive Disorder - Severe (296.23)  Axis I: Major Depression, Recurrent severe  ADL's:  Intact  Sleep: Fair  Appetite:  Fair  Suicidal Ideation:  Passive SI to choke or cut self, able to contract for safety.  Homicidal Ideation:  Passive HI to hurt mother AEB (as evidenced by):  Psychiatric Specialty Exam: Review of Systems  Constitutional: Negative.   Eyes: Negative.   Respiratory: Negative.   Cardiovascular: Negative.   Gastrointestinal: Negative.   Genitourinary: Negative.   Musculoskeletal: Negative.   Skin: Negative.   Neurological: Positive for tremors and headaches.  Endo/Heme/Allergies: Negative.   Psychiatric/Behavioral: Positive for depression and suicidal ideas. Negative for hallucinations, memory loss and substance abuse. The patient is nervous/anxious and has insomnia.     Blood pressure 110/70, pulse 87, temperature 97.6 F (36.4 C), temperature source Oral, resp. rate 18, height 5\' 11"  (1.803 m), weight 115.214 kg (254 lb), SpO2 95.00%.Body  mass index is 35.44 kg/(m^2).  General Appearance: Fairly Groomed  Patent attorney::  Fair  Speech:  Clear and Coherent  Volume:  Normal  Mood:  Anxious and Depressed  Affect:  Constricted  Thought Process:  Goal Directed  Orientation:  Full (Time, Place, and Person)  Thought Content:  NA  Suicidal Thoughts:  Yes   Homicidal Thoughts:  Yes.  without intent/plan  Memory:  Immediate;   Fair Recent;   Fair Remote;   Fair  Judgement:  Poor  Insight:  Lacking  Psychomotor Activity:  Decreased  Concentration:  Poor  Recall:  Fair  Akathisia:  No  Handed:  Right  AIMS (if indicated):     Assets:  Communication Skills Desire for Improvement Social Support  Sleep:  Number of Hours: 5   Current Medications: Current Facility-Administered Medications  Medication Dose Route Frequency Provider Last Rate Last Dose  . acetaminophen (TYLENOL) tablet 650 mg  650 mg Oral Q6H PRN Earney Navy, NP      . alum & mag hydroxide-simeth (MAALOX/MYLANTA) 200-200-20 MG/5ML suspension 30 mL  30 mL Oral Q4H PRN Earney Navy, NP      . amphetamine-dextroamphetamine (ADDERALL XR) 24 hr capsule 10 mg  10 mg Oral Daily Earney Navy, NP   10 mg at 04/06/13 0815  . aspirin-acetaminophen-caffeine (EXCEDRIN MIGRAINE) per tablet 1 tablet  1 tablet Oral Q6H PRN Earney Navy, NP   1 tablet at 04/06/13 0636  . enoxaparin (LOVENOX) injection 120 mg  120 mg Subcutaneous BID Mojeed Akintayo   120 mg at 04/06/13 0821  . gabapentin (NEURONTIN) capsule 300 mg  300 mg Oral TID Earney Navy, NP  300 mg at 04/06/13 0816  . hydrOXYzine (ATARAX/VISTARIL) tablet 50 mg  50 mg Oral Q6H PRN Mojeed Akintayo   50 mg at 04/05/13 2300  . ibuprofen (ADVIL,MOTRIN) tablet 600 mg  600 mg Oral Q8H PRN Earney Navy, NP      . insulin aspart (novoLOG) injection 0-20 Units  0-20 Units Subcutaneous TID WC Fransisca Kaufmann, NP   4 Units at 04/06/13 [id number]  . insulin aspart (novoLOG) injection 0-5 Units  0-5 Units  Subcutaneous QHS Fransisca Kaufmann, NP   2 Units at 04/05/13 2256  . insulin glargine (LANTUS) injection 40 Units  40 Units Subcutaneous QHS Earney Navy, NP   40 Units at 04/05/13 2254  . levETIRAcetam (KEPPRA) tablet 500 mg  500 mg Oral BID Earney Navy, NP   500 mg at 04/06/13 0815  . linagliptin (TRADJENTA) tablet 5 mg  5 mg Oral QAC breakfast Earney Navy, NP   5 mg at 04/06/13 0636  . magnesium hydroxide (MILK OF MAGNESIA) suspension 30 mL  30 mL Oral Daily PRN Earney Navy, NP      . metFORMIN (GLUCOPHAGE) tablet 1,000 mg  1,000 mg Oral BID WC Earney Navy, NP   1,000 mg at 04/06/13 0815  . multivitamin with minerals tablet 1 tablet  1 tablet Oral Daily Mojeed Akintayo   1 tablet at 04/06/13 0815  . ondansetron (ZOFRAN-ODT) disintegrating tablet 4 mg  4 mg Oral Q8H PRN Earney Navy, NP      . simvastatin (ZOCOR) tablet 10 mg  10 mg Oral q1800 Mojeed Akintayo   10 mg at 04/05/13 1706  . traZODone (DESYREL) tablet 100 mg  100 mg Oral QHS PRN Mojeed Akintayo      . Vilazodone HCl (VIIBRYD) TABS 40 mg  40 mg Oral Daily Earney Navy, NP   40 mg at 04/06/13 9604    Lab Results:  Results for orders placed during the hospital encounter of 04/04/13 (from the past 48 hour(s))  GLUCOSE, CAPILLARY     Status: Abnormal   Collection Time    04/04/13 11:51 AM      Result Value Range   Glucose-Capillary 182 (*) 70 - 99 mg/dL  GLUCOSE, CAPILLARY     Status: Abnormal   Collection Time    04/04/13  4:52 PM      Result Value Range   Glucose-Capillary 300 (*) 70 - 99 mg/dL  GLUCOSE, CAPILLARY     Status: Abnormal   Collection Time    04/04/13  8:37 PM      Result Value Range   Glucose-Capillary 235 (*) 70 - 99 mg/dL  GLUCOSE, CAPILLARY     Status: Abnormal   Collection Time    04/05/13  6:13 AM      Result Value Range   Glucose-Capillary 190 (*) 70 - 99 mg/dL  GLUCOSE, CAPILLARY     Status: Abnormal   Collection Time    04/05/13 11:49 AM      Result Value  Range   Glucose-Capillary 243 (*) 70 - 99 mg/dL   Comment 1 Notify RN    GLUCOSE, CAPILLARY     Status: Abnormal   Collection Time    04/05/13  4:55 PM      Result Value Range   Glucose-Capillary 268 (*) 70 - 99 mg/dL  GLUCOSE, CAPILLARY     Status: Abnormal   Collection Time    04/05/13 10:19 PM      Result Value Range  Glucose-Capillary 231 (*) 70 - 99 mg/dL   Comment 1 Notify RN    GLUCOSE, CAPILLARY     Status: Abnormal   Collection Time    04/06/13  6:09 AM      Result Value Range   Glucose-Capillary 194 (*) 70 - 99 mg/dL   Comment 1 Notify RN     Comment 2 Documented in Chart      Physical Findings: AIMS: Facial and Oral Movements Muscles of Facial Expression: None, normal Lips and Perioral Area: None, normal Jaw: None, normal Tongue: None, normal,Extremity Movements Upper (arms, wrists, hands, fingers): None, normal Lower (legs, knees, ankles, toes): None, normal, Trunk Movements Neck, shoulders, hips: None, normal, Overall Severity Severity of abnormal movements (highest score from questions above): None, normal Incapacitation due to abnormal movements: None, normal Patient's awareness of abnormal movements (rate only patient's report): No Awareness, Dental Status Current problems with teeth and/or dentures?: No Does patient usually wear dentures?: No  CIWA:    COWS:     Treatment Plan Summary: Daily contact with patient to assess and evaluate symptoms and progress in treatment Medication management  Plan: Continue crisis management and stabilization.  Medication management: Reviewed with patient who stated no untoward effects. Continue Viibryd 40 mg for depression. Continue Neurontin for mood stability. Continue Keppra 500 mg BID for seizures. Continue Trazodone 100 mg hs prn for insomnia.  Encouraged patient to attend groups and participate in group counseling sessions and activities.  Discharge plan in progress.  Continue current treatment plan.  Address  health issues: Vitals reviewed and stable. Monitoring CBG values with SSI in place along with scheduled Lantus. Patient requesting headache medication prn.  Medical Decision Making Problem Points:  Established problem, stable/improving (1), Review of last therapy session (1) and Review of psycho-social stressors (1) Data Points:  Order Aims Assessment (2) Review of medication regiment & side effects (2) Review of new medications or change in dosage (2)  I certify that inpatient services furnished can reasonably be expected to improve the patient's condition.   Fransisca Kaufmann, NP-C  04/06/2013, 11:30 AM

## 2013-04-06 NOTE — Progress Notes (Signed)
Recreation Therapy Notes  Date: 09.05.2014  Time: 9:30pm  Location: [address] Day[address]   Group Topic: Coping Skills   Goal Area(s) Addresses:  Patient will identify things they are grateful for.  Patient will identify how being grateful can influence decision making.   Behavioral Response: Disengaged  Education: Pharmacologist, Discharge Planning   Education Outcome: Acknowledges understanding   Clinical Observations/Feedback: Patient attended group, but did not participate in group session. Patient wrote in journal for entire group session.   Marykay Lex Phelix Fudala, LRT/CTRS  Royce Stegman L 04/06/2013 2:20 PM

## 2013-04-06 NOTE — Progress Notes (Signed)
Adult Psychoeducational Group Note  Date:  04/06/2013 Time:  11:26 AM  Group Topic/Focus:  Early Warning Signs:   The focus of this group is to help patients identify signs or symptoms they exhibit before slipping into an unhealthy state or crisis.  Participation Level:  Active  Participation Quality:  Attentive  Affect:  Blunted, Depressed and Flat  Cognitive:  Alert and Appropriate  Insight: Good  Engagement in Group:  Engaged and Limited  Modes of Intervention:  Discussion, Socialization and Support  Additional Comments:  Pt came to group and shared at times. Pt appeared upset and held a journal up to his face and wrote for most of the group.  Cathlean Cower 04/06/2013, 11:26 AM

## 2013-04-06 NOTE — [facility] Group Notes (Signed)
Adult Psychoeducational Group Note  Date:  04/06/2013 Time:  9:13 PM  Group Topic/Focus:  Wrap-Up Group:   The focus of this group is to help patients review their daily goal of treatment and discuss progress on daily workbooks.  Participation Level:  Active  Participation Quality:  Appropriate  Affect:  Appropriate  Cognitive:  Appropriate  Insight: Appropriate  Engagement in Group:  Engaged  Modes of Intervention:  Discussion  Additional Comments:  Shamel was engaged in group.  He was attentive to his peers.  He said his day started off rough but got better after dinner because he was able to talk to his roommate and have a conversation without his thoughts running together.  Caroll Rancher A 04/06/2013, 9:13 PM

## 2013-04-06 NOTE — Progress Notes (Signed)
Patient ID: Bryan Mcmillan, male   DOB: August 13, 1970, 42 y.o.   MRN: [id number]  D:  Pt endorses passive SI, but contracts for safety. Pt HI is still towards  His mother. Pt is still unclear if he has AH of someone calling his name. Pt denies HV. Pt is pleasant and cooperative. Pt said he was crying a lot last night, but felt better today. Pt says he's stressed about where he is going when he leaves [facility], "I can't go back home".    A: Pt was offered support and encouragement. Pt was given scheduled medications. Pt was encourage to attend groups. Q 15 minute checks were done for safety.    R:Pt attends groups and interacts well with peers and staff. Pt is taking medication. Pt has no complaints at this time.Pt receptive to treatment and safety maintained on unit.

## 2013-04-06 NOTE — [facility] Group Notes (Signed)
[facility] LCSW Aftercare Discharge Planning Group Note   04/06/2013 8:25 AM  Participation Quality:  Minimal   Mood/Affect:  Depressed  Depression Rating:  10  Anxiety Rating:  10  Thoughts of Suicide: Yes Will you contract for safety?   Yes  Current AVH:  NA  Plan for Discharge/Comments:  Arel did not sleep well.  His bishop visited yesterday, informed him that he cannot stay in [place].  He is contemplating moving to [place], though does not know specifically where he could stay.  Would like to arrange a meeting with his mother to "help her understand why he acts the way he does."    Transportation Means: unk  Supports: bishop?  [name]

## 2013-04-07 ENCOUNTER — Emergency Department (HOSPITAL_COMMUNITY): Payer: Medicaid Other

## 2013-04-07 ENCOUNTER — Encounter (HOSPITAL_COMMUNITY): Payer: Self-pay | Admitting: Radiology

## 2013-04-07 ENCOUNTER — Emergency Department (HOSPITAL_COMMUNITY)
Admission: EM | Admit: 2013-04-07 | Discharge: 2013-04-07 | Disposition: A | Discharge status: Other Acute Inpatient Hospital | Payer: Federal, State, Local not specified - Other | Attending: Emergency Medicine | Admitting: Emergency Medicine

## 2013-04-07 ENCOUNTER — Emergency Department (HOSPITAL_COMMUNITY): Payer: Managed Care, Other (non HMO)

## 2013-04-07 ENCOUNTER — Inpatient Hospital Stay (HOSPITAL_COMMUNITY)
Admission: AD | Admit: 2013-04-07 | Discharge: 2013-04-10 | Disposition: A | Discharge status: Home / Self Care | Payer: No Typology Code available for payment source | Source: Ambulatory Visit | Attending: Psychiatry | Admitting: Psychiatry

## 2013-04-07 DIAGNOSIS — M6281 Muscle weakness (generalized): Secondary | ICD-10-CM | POA: Insufficient documentation

## 2013-04-07 DIAGNOSIS — G43909 Migraine, unspecified, not intractable, without status migrainosus: Secondary | ICD-10-CM | POA: Insufficient documentation

## 2013-04-07 DIAGNOSIS — Z86718 Personal history of other venous thrombosis and embolism: Secondary | ICD-10-CM | POA: Insufficient documentation

## 2013-04-07 DIAGNOSIS — E669 Obesity, unspecified: Secondary | ICD-10-CM | POA: Insufficient documentation

## 2013-04-07 DIAGNOSIS — F411 Generalized anxiety disorder: Secondary | ICD-10-CM | POA: Insufficient documentation

## 2013-04-07 DIAGNOSIS — F3289 Other specified depressive episodes: Secondary | ICD-10-CM | POA: Insufficient documentation

## 2013-04-07 DIAGNOSIS — E785 Hyperlipidemia, unspecified: Secondary | ICD-10-CM | POA: Insufficient documentation

## 2013-04-07 DIAGNOSIS — E1165 Type 2 diabetes mellitus with hyperglycemia: Secondary | ICD-10-CM

## 2013-04-07 DIAGNOSIS — F449 Dissociative and conversion disorder, unspecified: Secondary | ICD-10-CM | POA: Insufficient documentation

## 2013-04-07 DIAGNOSIS — Z87891 Personal history of nicotine dependence: Secondary | ICD-10-CM | POA: Insufficient documentation

## 2013-04-07 DIAGNOSIS — G40909 Epilepsy, unspecified, not intractable, without status epilepticus: Secondary | ICD-10-CM | POA: Insufficient documentation

## 2013-04-07 DIAGNOSIS — R072 Precordial pain: Secondary | ICD-10-CM | POA: Insufficient documentation

## 2013-04-07 DIAGNOSIS — J45909 Unspecified asthma, uncomplicated: Secondary | ICD-10-CM | POA: Insufficient documentation

## 2013-04-07 DIAGNOSIS — Z794 Long term (current) use of insulin: Secondary | ICD-10-CM | POA: Insufficient documentation

## 2013-04-07 DIAGNOSIS — Z8611 Personal history of tuberculosis: Secondary | ICD-10-CM | POA: Insufficient documentation

## 2013-04-07 DIAGNOSIS — F329 Major depressive disorder, single episode, unspecified: Secondary | ICD-10-CM

## 2013-04-07 DIAGNOSIS — Z79899 Other long term (current) drug therapy: Secondary | ICD-10-CM | POA: Insufficient documentation

## 2013-04-07 DIAGNOSIS — E119 Type 2 diabetes mellitus without complications: Secondary | ICD-10-CM | POA: Insufficient documentation

## 2013-04-07 DIAGNOSIS — R51 Headache: Secondary | ICD-10-CM | POA: Insufficient documentation

## 2013-04-07 DIAGNOSIS — Z87442 Personal history of urinary calculi: Secondary | ICD-10-CM | POA: Insufficient documentation

## 2013-04-07 DIAGNOSIS — F333 Major depressive disorder, recurrent, severe with psychotic symptoms: Secondary | ICD-10-CM

## 2013-04-07 DIAGNOSIS — Z8701 Personal history of pneumonia (recurrent): Secondary | ICD-10-CM | POA: Insufficient documentation

## 2013-04-07 LAB — BASIC METABOLIC PANEL
Calcium: 9.9 mg/dL (ref 8.4–10.5)
Creatinine, Ser: 0.89 mg/dL (ref 0.50–1.35)
GFR calc Af Amer: >90 mL/min (ref >90)

## 2013-04-07 LAB — GLUCOSE, CAPILLARY
Glucose-Capillary: 215 mg/dL — ABNORMAL HIGH (ref 70–99)
Glucose-Capillary: 263 mg/dL — ABNORMAL HIGH (ref 70–99)
Glucose-Capillary: 213 mg/dL — ABNORMAL HIGH (ref 70–99)

## 2013-04-07 LAB — CBC
MCH: 30.2 pg (ref 26.0–34.0)
MCHC: 35.6 g/dL (ref 30.0–36.0)
MCV: 84.8 fL (ref 78.0–100.0)
Platelets: 281 10*3/uL (ref 150–400)
RDW: 13 % (ref 11.5–15.5)

## 2013-04-07 LAB — POCT I-STAT, CHEM 8
HCT: 47 % (ref 39.0–52.0)
Hemoglobin: 16 g/dL (ref 13.0–17.0)
Potassium: 4.3 mEq/L (ref 3.5–5.1)
Sodium: 141 mEq/L (ref 135–145)

## 2013-04-07 LAB — PROTIME-INR: INR: 0.95 (ref 0.00–1.49)

## 2013-04-07 LAB — APTT: aPTT: 20 seconds — ABNORMAL LOW (ref 24–37)

## 2013-04-07 MED ORDER — IOHEXOL 350 MG/ML SOLN
100.0000 mL | Freq: Once | INTRAVENOUS | Status: AC | PRN
  Administered 2013-04-07: 100 mL via INTRAVENOUS

## 2013-04-07 NOTE — ED Notes (Signed)
Pt has returned from MRI at this time.

## 2013-04-07 NOTE — ED Notes (Signed)
Pt currently in MRI

## 2013-04-07 NOTE — ED Provider Notes (Signed)
CSN: [id number]     Arrival date & time 04/07/13  1926 History   First MD Initiated Contact with Patient 04/07/13 1933     Chief Complaint  Patient presents with  . Code Stroke   (Consider location/radiation/quality/duration/timing/severity/associated sxs/prior Treatment) HPI level caveat urgently for intervention code stroke. Patient developed sudden onset left arm and left leg weakness 6:10 PM accompanied by intermittent anterior chest pain which is mild, nonradiating substernal and diffuse headache. No other complaint. No treatment prior to coming here. Brought by EMS as transfer [facility] where he is currently has plus for anxiety depression. Past Medical History  Diagnosis Date  . Asthma   . Depression   . Diabetes   . Headache(784.0)     frequent  . Migraines   . History of blood clots   . Hyperlipidemia   . Obesity   . Sleep apnea   . DVT (deep venous thrombosis), hx of recurrent 05/23/2012  . History of blood clot in brain, 2012 - [facility] by [facility] 05/23/2012  . Seizure disorder - followed by [facility] 05/23/2012  . Hyperlipemia 08/25/2012  . Anxiety   . Tuberculosis   . Pneumonia   . Kidney disease   . Kidney stones   . Seizure    Past Surgical History  Procedure Laterality Date  . Brain surgery    . Filter for blood clots     Family History  Problem Relation Age of Onset  . Hyperlipidemia      parent  . Heart disease      parent  . Stroke      parent  . Diabetes      grandparent /parent  . Obesity Other   . Heart attack Other    History  Substance Use Topics  . Smoking status: Former Games developer  . Smokeless tobacco: Not on file  . Alcohol Use: No    Review of Systems  Unable to perform ROS: Acuity of condition    Allergies  Penicillins  Home Medications   Current Outpatient Rx  Name  Route  Sig  Dispense  Refill  . amphetamine-dextroamphetamine (ADDERALL XR) 10 MG 24 hr capsule   Oral   Take 1 capsule (10  mg total) by mouth daily. For ADHD      0   . aspirin-acetaminophen-caffeine (EXCEDRIN MIGRAINE) 250-250-65 MG per tablet   Oral   Take 1 tablet by mouth every 6 (six) hours as needed for pain. Headaches         . enoxaparin (LOVENOX) 150 MG/ML injection   Subcutaneous   Inject 150 mg into the skin every 12 (twelve) hours. For blood clots         . gabapentin (NEURONTIN) 300 MG capsule   Oral   Take 300 mg by mouth 3 (three) times daily. For anxiety         . hydrOXYzine (ATARAX/VISTARIL) 25 MG tablet   Oral   Take 25 mg by mouth every 4 (four) hours as needed for anxiety (Sleep).         . insulin glargine (LANTUS) 100 UNIT/ML injection   Subcutaneous   Inject 40 Units into the skin at bedtime. For diabetes management         . levETIRAcetam (KEPPRA) 500 MG tablet   Oral   Take 500 mg by mouth 2 (two) times daily. For seizure activities         . metFORMIN (GLUCOPHAGE) 1000 MG tablet  Oral   Take 1,000 mg by mouth 2 (two) times daily with a meal. For diabetes management         . pravastatin (PRAVACHOL) 80 MG tablet   Oral   Take 80 mg by mouth daily. For high cholesterol control         . sitaGLIPtin (JANUVIA) 100 MG tablet   Oral   Take 100 mg by mouth daily. Before breakfast: For diabetes control         . traZODone (DESYREL) 50 MG tablet   Oral   Take 50 mg by mouth at bedtime and may repeat dose one time if needed. For sleep         . Vilazodone HCl (VIIBRYD) 40 MG TABS   Oral   Take 40 mg by mouth daily. For dpression          BP 133/78  Pulse 82  Temp(Src) 98.2 F (36.8 C)  Resp 18  SpO2 93% Physical Exam  Nursing note and vitals reviewed. Constitutional: He appears well-developed and well-nourished.  HENT:  Head: Normocephalic and atraumatic.  Eyes: Conjunctivae are normal. Pupils are equal, round, and reactive to light.  Neck: Neck supple. No tracheal deviation present. No thyromegaly present.  Cardiovascular: Normal rate  and regular rhythm.   No murmur heard. Pulmonary/Chest: Effort normal and breath sounds normal.  Abdominal: Soft. Bowel sounds are normal. He exhibits no distension. There is no tenderness.  Musculoskeletal: He exhibits no edema and no tenderness.  Motor strength right arm 5 over 5 right leg 5 over 5 left leg 1/5 left arm 1/5  Neurological: He is alert. No cranial nerve deficit. Coordination normal.  Skin: Skin is warm and dry. No rash noted.  Psychiatric: He has a normal mood and affect.    ED Course  Procedures (including critical care time) Labs Review Labs Reviewed  BASIC METABOLIC PANEL - Abnormal; Notable for the following:    Glucose, Bld 150 (*)    All other components within normal limits  APTT - Abnormal; Notable for the following:    aPTT 20 (*)    All other components within normal limits  POCT I-STAT, CHEM 8 - Abnormal; Notable for the following:    Glucose, Bld 147 (*)    All other components within normal limits  CBC  PROTIME-INR  TROPONIN I   Date: 04/07/2013  Rate: 85  Rhythm: normal sinus rhythm  QRS Axis: normal  Intervals: normal  ST/T Wave abnormalities: normal  Conduction Disutrbances: none  Narrative Interpretation: unremarkable    Imaging Review Ct Head Wo Contrast  04/07/2013   *RADIOLOGY REPORT*  Clinical Data:  Code stroke, slurred speech, left side weakness, history diabetes, hyperlipidemia, seizure disorder  CT HEAD WITHOUT CONTRAST  Technique:  Contiguous axial images were obtained from the base of the skull through the vertex without contrast.  Comparison: 06/20/2012  Findings: Minimal atrophy for age. Normal ventricular morphology. No midline shift or mass effect. Prior left temporal craniotomy. Otherwise normal appearance brain parenchyma. No intracranial hemorrhage, mass lesion or evidence of acute infarction. No extra-axial fluid collections. Bones and sinuses otherwise unremarkable.  IMPRESSION: No acute intracranial abnormalities.  Critical  Value/emergent results were called by telephone at the time of interpretation on 04/07/2013 at 1944 hours to Dr. Ethelda Chick, who verbally acknowledged these results.   Original Report Authenticated By: Ulyses Southward, M.D.   Ct Angio Chest Aorta W/cm &/or Wo/cm  04/07/2013   *RADIOLOGY REPORT*  Clinical Data:  Left side weakness,  facial droop, chest pain, history asthma, hyperlipidemia, blood clots, tuberculosis  CT ANGIOGRAPHY CHEST  Technique:  Multidetector CT imaging of the chest using the standard protocol during bolus administration of intravenous contrast. Multiplanar reconstructed images including MIPs were obtained and reviewed to evaluate the vascular anatomy.  Contrast: OMNIPAQUE IOHEXOL 350 MG/ML SOLN  Comparison: None  Findings: Aorta normal caliber without aneurysm or dissection. Visualized portion of upper abdomen shows no acute abnormalities. Pulmonary arteries patent. No evidence of pulmonary embolism. No thoracic adenopathy. Minimal atelectasis dependently in the posterior lungs as well as at right middle lobe. No pulmonary infiltrate, pleural effusion or pneumothorax. 4 mm right middle lobe nodule image 35. No acute osseous findings.  IMPRESSION: No evidence of aortic dissection or pulmonary embolism. Minimal scattered atelectasis. 4 mm right middle lobe nodule, recommendation below. If the patient is at high risk for bronchogenic carcinoma, follow- up chest CT at 1 year is recommended.  If the patient is at low risk, no follow-up is needed.  This recommendation follows the consensus statement: Guidelines for Management of Small Pulmonary Nodules Detected on CT Scans:  A Statement from the Fleischner Society as published in Radiology 2005; 237:395-400.   Original Report Authenticated By: Ulyses Southward, M.D.   Dr. Roseanne Reno evaluate patient in the emergency department. feels symptoms likely psychogenic. His consult was been reviewed by me  At 9:10 AM patient is alert and ambulates without  difficulty, moves all extremities. MDM  No diagnosis found. CT angiographic chest ordered every 2 chest pain accompanied by focal neurologic deficit. Constellation of symptoms can be consistent with aortic dissection. I feel that patient is at low risk for bronchogenic carcinoma. Patient's be safely returned to behavioral health hospital Diagnosis #1 conversion reaction #2 hyperglycemia   Doug Sou, MD 04/07/13 2117

## 2013-04-07 NOTE — Progress Notes (Addendum)
D-  Patients presents with blunted affect and depressed and anxious mood, continues to have difficulty with his sleep awakens 2-3 x a night, " I've been worried where I'm going to live when I leave here"  Adl's are slightly better  today , reports energy level is down and appetite is fair. Pt has contracted for safety, has vague headaches ,when ask  Pt he walk away. Will continue to pursue.  A- Support and Encouragement provided, Allowed patient to ventilate during 1:1.  R- Will continue to monitor on q 15 minute checks for safety, compliant with medications and programing

## 2013-04-07 NOTE — Progress Notes (Signed)
At 7:20 pm attempted to call Aggie NP regarding patient being sent to ER, message left on voicemail. Also attempted to call patients mother, Kavari Parrillo [phone], message left on voicemail.

## 2013-04-07 NOTE — [facility] Group Notes (Signed)
[facility] Group Notes:  (Clinical Social Work)  04/07/2013  11:15-11:45AM  Summary of Progress/Problems:   The main focus of today's process group was for the patient to identify ways in which they have in the past sabotaged their own recovery and reasons they may have done this/what they received from doing it.  We then worked to identify a specific plan to avoid doing this when discharged from the hospital for this admission.  The patient expressed that he self-sabotages with continual negative self-talk, stating that he learned as a young child that someone else's needs are always greater than his.  He talked about how he has had a headache since arrival and when he asks various RNs to address this, they ask "for a minute" and then forget him, so now he will not ask at all.  He stated he always asks himself with every problem if it is something he can handle, and CSW reinforced that this is normal and adaptive thinking.  He has what he called a "certain level of paranoia" and believes he needs to handle everything.  He talked at length about not yet having the coping skills to handle his triggers when he leaves the hospital, and not even having the coping skills to handle being in the hospital.  Type of Therapy:  Group Therapy - Process  Participation Level:  Active  Participation Quality:  Attentive and Sharing  Affect:  Blunted and Depressed  Cognitive:  Oriented  Insight:  Improving  Engagement in Therapy:  Developing/Improving  Modes of Intervention:  Clarification, Education, Exploration, Discussion  Ambrose Mantle, LCSW 04/07/2013, 12:52 PM

## 2013-04-07 NOTE — Code Documentation (Signed)
Patient in normal state of health throughout day at [facility] until he started having chest pain and arm pain around 1800, EKG performed, RN returned to room around 1810 and patient showed signs of left sided weakness and facial droop. Patient transferred to [facility] for further workup. Code stroke called at 1918, patient arrived at 11 via [place], [place] at 1810, EDP exam at 1929, stroke team arrived at [phone], neurologist arrived at 1935, patient arrived in CT at 1929, phlebotomist arrived at 1930, CT read by Dr. Roseanne Reno at [phone]. Initial NIH 0. Patient has poor and inconsistent exam, but with good muscle strength. Will continue to monitor, patient will return to [facility] if MRI negative

## 2013-04-07 NOTE — Consult Note (Signed)
Referring Physician: Dr. Rennis Chris    Chief Complaint: Left-sided weakness with facial droop of acute onset  HPI: Bryan Mcmillan is an 42 y.o. male with a history of diabetes mellitus, hyperlipidemia, seizure disorder, migraine headaches and depression who was transferred from [facility] with complaint of chest pain as well as new onset left-sided weakness with facial droop. Onset was at 6:10 PM today. Patient has not been on daily antiplatelet therapy. CT scan of his head showed no acute intracranial abnormality. Patient was noted to have inconsistencies in movement and strength of his left extremities. There was no reported change in speech. His NIH stroke score was 0, with no physiologic deficits noted. He was therefore deemed not to be a candidate for thrombolytic therapy consideration.  LSN: 6:10 PM on 04/07/2013 tPA Given: No: No objective neurologic deficits MRankin: 0  Past Medical History  Diagnosis Date  . Asthma   . Depression   . Diabetes   . Headache(784.0)     frequent  . Migraines   . History of blood clots   . Hyperlipidemia   . Obesity   . Sleep apnea   . DVT (deep venous thrombosis), hx of recurrent 05/23/2012  . History of blood clot in brain, 2012 - followed by [facility] 05/23/2012  . Seizure disorder - followed by [facility] 05/23/2012  . Hyperlipemia 08/25/2012  . Anxiety   . Tuberculosis   . Pneumonia   . Kidney disease   . Kidney stones   . Seizure     Family History  Problem Relation Age of Onset  . Hyperlipidemia      parent  . Heart disease      parent  . Stroke      parent  . Diabetes      grandparent /parent  . Obesity Other   . Heart attack Other      Medications: I have reviewed the patient's current medications.  ROS: History obtained from the patient  General ROS: negative for - chills, fatigue, fever, night sweats, weight gain or weight loss Psychological ROS: Currently admitted to [facility] for recurrent depression Ophthalmic  ROS: negative for - blurry vision, double vision, eye pain or loss of vision ENT ROS: negative for - epistaxis, nasal discharge, oral lesions, sore throat, tinnitus or vertigo Allergy and Immunology ROS: negative for - hives or itchy/watery eyes Hematological and Lymphatic ROS: negative for - bleeding problems, bruising or swollen lymph nodes Endocrine ROS: negative for - galactorrhea, hair pattern changes, polydipsia/polyuria or temperature intolerance Respiratory ROS: negative for - cough, hemoptysis, shortness of breath or wheezing Cardiovascular ROS: negative for - chest pain, dyspnea on exertion, edema or irregular heartbeat Gastrointestinal ROS: negative for - abdominal pain, diarrhea, hematemesis, nausea/vomiting or stool incontinence Genito-Urinary ROS: negative for - dysuria, hematuria, incontinence or urinary frequency/urgency Musculoskeletal ROS: negative for - joint swelling or muscular weakness Neurological ROS: as noted in HPI Dermatological ROS: negative for rash and skin lesion changes  Physical Examination: Blood pressure 129/87, pulse 85.  Neurologic Examination: Mental Status: Alert, oriented, thought content appropriate.  Speech was somewhat stuttering without evidence of aphasia. Variable cooperation with examination of left extremities. Cranial Nerves: II-Visual fields were normal. III/IV/VI-Pupils were equal and reacted. Extraocular movements were full and conjugate.    V/VII-no facial numbness and no physiologic facial weakness. VIII-normal. X-no dysarthria. Motor: Able to extend his arm for 10 seconds without drift against gravity; able to hold left lower extremity against gravity for 5 seconds without drift; refused to  move digits on requests to make a fist with his left hand; normal strength throughout of right extremities. Muscle tone was normal throughout. Sensory: Normal throughout. Deep Tendon Reflexes: 2+ and symmetric. 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 Cerebellar: Normal finger-to-nose testing. Carotid auscultation: Normal  Ct Head Wo Contrast  04/07/2013   *RADIOLOGY REPORT*  Clinical Data:  Code stroke, slurred speech, left side weakness, history diabetes, hyperlipidemia, seizure disorder  CT HEAD WITHOUT CONTRAST  Technique:  Contiguous axial images were obtained from the base of the skull through the vertex without contrast.  Comparison: 06/20/2012  Findings: Minimal atrophy for age. Normal ventricular morphology. No midline shift or mass effect. Prior left temporal craniotomy. Otherwise normal appearance brain parenchyma. No intracranial hemorrhage, mass lesion or evidence of acute infarction. No extra-axial fluid collections. Bones and sinuses otherwise unremarkable.  IMPRESSION: No acute intracranial abnormalities.  Critical Value/emergent results were called by telephone at the time of interpretation on 04/07/2013 at 1944 hours to Dr. Ethelda Chick, who verbally acknowledged these results.   Original Report Authenticated By: Ulyses Southward, M.D.    Assessment: 42 y.o. male presenting with a complaint of left-sided weakness with no objective signs of acute stroke or TIA. NIH stroke score 0. Psychophysiologic factors suspected as significant contributor to the patient's symptomatology.  Stroke Risk Factors - diabetes mellitus and hyperlipidemia  Plan: 1. MRI of the brain without contrast to rule out any signs of acute ischemic stroke 2. Admission stroke workup if stroke is demonstrated; otherwise no further neurodiagnostic intervention is warranted and patient may be returned to [facility] for further management 3. Aspirin 81 mg per day, if not contraindicated.  C.R. Roseanne Reno, MD [facility] [phone]  04/07/2013, 7:53 PM

## 2013-04-07 NOTE — BH Assessment (Signed)
Pt is going to be sent out via EMS, EMS advised me to let Charge Nurse at [facility] know that is was a Code Stroke. Attempted to call report to Charge Nurse Sprague, she reported that she would get report from EMS.

## 2013-04-07 NOTE — Progress Notes (Signed)
Patient ID: Bryan Mcmillan, male   DOB: 10/05/1970, 42 y.o.   MRN: [id number] Patient ID: Bryan Mcmillan, male   DOB: [birth year], 42 y.o.   MRN: [id number] [facility] MD Progress Note  04/07/2013 2:59 PM Jewell Ryans  MRN:  [id number] Subjective: Patient states still feeling depressed. Mom kicked me out. Medications are helping but worried how to deal with mom and stress at home. Objective: Patient reports ongoing depression and worriful about his home situation. Mood dysthymic and blunt.  Diagnosis:   DSM5: Schizophrenia Disorders:   Obsessive-Compulsive Disorders:   Trauma-Stressor Disorders:   Substance/Addictive Disorders:   Depressive Disorders:  Major Depressive Disorder - Severe (296.23)  Axis I: Major Depression, Recurrent severe  ADL's:  Intact  Sleep: Fair  Appetite:  Fair  Suicidal Ideation:  Passive SI to choke or cut self, able to contract for safety.  Homicidal Ideation:  Passive HI to hurt mother AEB (as evidenced by):  Psychiatric Specialty Exam: Review of Systems  Constitutional: Negative.   Eyes: Negative.   Respiratory: Negative.   Cardiovascular: Negative.   Gastrointestinal: Negative.   Genitourinary: Negative.   Musculoskeletal: Negative.   Skin: Negative.   Neurological: Positive for tremors and headaches.  Endo/Heme/Allergies: Negative.   Psychiatric/Behavioral: Positive for depression and suicidal ideas. Negative for hallucinations, memory loss and substance abuse. The patient is nervous/anxious and has insomnia.     Blood pressure 123/85, pulse 96, temperature 97.7 F (36.5 C), temperature source Oral, resp. rate 18, height 5\' 11"  (1.803 m), weight 115.214 kg (254 lb), SpO2 95.00%.Body mass index is 35.44 kg/(m^2).  General Appearance: Fairly Groomed  Patent attorney::  Fair  Speech:  Clear and Coherent  Volume:  Normal  Mood:  Anxious and Depressed  Affect:  Constricted  Thought Process:  Goal Directed  Orientation:  Full (Time, Place, and  Person)  Thought Content:  NA  Suicidal Thoughts:  Yes   Homicidal Thoughts:  Yes.  without intent/plan  Memory:  Immediate;   Fair Recent;   Fair Remote;   Fair  Judgement:  Poor  Insight:  Lacking  Psychomotor Activity:  Decreased  Concentration:  Poor  Recall:  Fair  Akathisia:  No  Handed:  Right  AIMS (if indicated):     Assets:  Communication Skills Desire for Improvement Social Support  Sleep:  Number of Hours: 5.5   Current Medications: Current Facility-Administered Medications  Medication Dose Route Frequency Provider Last Rate Last Dose  . acetaminophen (TYLENOL) tablet 650 mg  650 mg Oral Q6H PRN Earney Navy, NP      . alum & mag hydroxide-simeth (MAALOX/MYLANTA) 200-200-20 MG/5ML suspension 30 mL  30 mL Oral Q4H PRN Earney Navy, NP      . amphetamine-dextroamphetamine (ADDERALL XR) 24 hr capsule 10 mg  10 mg Oral Daily Earney Navy, NP   10 mg at 04/07/13 0920  . aspirin-acetaminophen-caffeine (EXCEDRIN MIGRAINE) per tablet 1 tablet  1 tablet Oral Q6H PRN Earney Navy, NP   1 tablet at 04/06/13 1826  . enoxaparin (LOVENOX) injection 120 mg  120 mg Subcutaneous BID Mojeed Akintayo   120 mg at 04/07/13 0821  . gabapentin (NEURONTIN) capsule 300 mg  300 mg Oral TID Earney Navy, NP   300 mg at 04/07/13 1153  . hydrOXYzine (ATARAX/VISTARIL) tablet 50 mg  50 mg Oral Q6H PRN Mojeed Akintayo   50 mg at 04/05/13 2300  . ibuprofen (ADVIL,MOTRIN) tablet 600 mg  600 mg Oral Q8H PRN Earney Navy,  NP      . insulin aspart (novoLOG) injection 0-20 Units  0-20 Units Subcutaneous TID WC Fransisca Kaufmann, NP   11 Units at 04/07/13 1157  . insulin aspart (novoLOG) injection 0-5 Units  0-5 Units Subcutaneous QHS Fransisca Kaufmann, NP   2 Units at 04/05/13 2256  . insulin glargine (LANTUS) injection 40 Units  40 Units Subcutaneous QHS Earney Navy, NP   40 Units at 04/06/13 2115  . levETIRAcetam (KEPPRA) tablet 500 mg  500 mg Oral BID Earney Navy, NP    500 mg at 04/07/13 0820  . linagliptin (TRADJENTA) tablet 5 mg  5 mg Oral QAC breakfast Earney Navy, NP   5 mg at 04/07/13 0648  . magnesium hydroxide (MILK OF MAGNESIA) suspension 30 mL  30 mL Oral Daily PRN Earney Navy, NP      . metFORMIN (GLUCOPHAGE) tablet 1,000 mg  1,000 mg Oral BID WC Earney Navy, NP   1,000 mg at 04/07/13 0820  . multivitamin with minerals tablet 1 tablet  1 tablet Oral Daily Mojeed Akintayo   1 tablet at 04/07/13 0821  . ondansetron (ZOFRAN-ODT) disintegrating tablet 4 mg  4 mg Oral Q8H PRN Earney Navy, NP      . simvastatin (ZOCOR) tablet 10 mg  10 mg Oral q1800 Mojeed Akintayo   10 mg at 04/06/13 1708  . traZODone (DESYREL) tablet 100 mg  100 mg Oral QHS PRN Mojeed Akintayo   100 mg at 04/06/13 2118  . Vilazodone HCl (VIIBRYD) TABS 40 mg  40 mg Oral Daily Earney Navy, NP   40 mg at 04/07/13 1610    Lab Results:  Results for orders placed during the hospital encounter of 04/04/13 (from the past 48 hour(s))  GLUCOSE, CAPILLARY     Status: Abnormal   Collection Time    04/05/13  4:55 PM      Result Value Range   Glucose-Capillary 268 (*) 70 - 99 mg/dL  GLUCOSE, CAPILLARY     Status: Abnormal   Collection Time    04/05/13 10:19 PM      Result Value Range   Glucose-Capillary 231 (*) 70 - 99 mg/dL   Comment 1 Notify RN    GLUCOSE, CAPILLARY     Status: Abnormal   Collection Time    04/06/13  6:09 AM      Result Value Range   Glucose-Capillary 194 (*) 70 - 99 mg/dL   Comment 1 Notify RN     Comment 2 Documented in Chart    GLUCOSE, CAPILLARY     Status: Abnormal   Collection Time    04/06/13 11:31 AM      Result Value Range   Glucose-Capillary 284 (*) 70 - 99 mg/dL   Comment 1 Notify RN    GLUCOSE, CAPILLARY     Status: Abnormal   Collection Time    04/06/13  4:59 PM      Result Value Range   Glucose-Capillary 282 (*) 70 - 99 mg/dL  GLUCOSE, CAPILLARY     Status: Abnormal   Collection Time    04/06/13  8:35 PM       Result Value Range   Glucose-Capillary 194 (*) 70 - 99 mg/dL  GLUCOSE, CAPILLARY     Status: Abnormal   Collection Time    04/07/13  6:21 AM      Result Value Range   Glucose-Capillary 215 (*) 70 - 99 mg/dL  GLUCOSE, CAPILLARY  Status: Abnormal   Collection Time    04/07/13 11:51 AM      Result Value Range   Glucose-Capillary 263 (*) 70 - 99 mg/dL   Comment 1 Documented in Chart     Comment 2 Notify RN      Physical Findings: AIMS: Facial and Oral Movements Muscles of Facial Expression: None, normal Lips and Perioral Area: None, normal Jaw: None, normal Tongue: None, normal,Extremity Movements Upper (arms, wrists, hands, fingers): None, normal Lower (legs, knees, ankles, toes): None, normal, Trunk Movements Neck, shoulders, hips: None, normal, Overall Severity Severity of abnormal movements (highest score from questions above): None, normal Incapacitation due to abnormal movements: None, normal Patient's awareness of abnormal movements (rate only patient's report): No Awareness, Dental Status Current problems with teeth and/or dentures?: No Does patient usually wear dentures?: No  CIWA:    COWS:     Treatment Plan Summary: Daily contact with patient to assess and evaluate symptoms and progress in treatment Medication management  Plan: Continue crisis management and stabilization.  Medication management:Continue Vibryd, Keppra and Trazadone. Participate in group counseling sessions and activities.  Many need to increase Vibryd if needed. He feels meds are at right dose now and may take more time. Medical Decision Making Problem Points:  Established problem, stable/improving (1), Review of last therapy session (1) and Review of psycho-social stressors (1) Data Points:  Review or order clinical lab tests (1) Review of medication regiment & side effects (2)  I certify that inpatient services furnished can reasonably be expected to improve the patient's condition.    Fredric Slabach, NP-C  04/07/2013, 2:59 PM

## 2013-04-07 NOTE — ED Notes (Signed)
Pt coming form [facility] following stroke like symptoms. Last known well was 1810. Staff noticed left sided facial droop, and weakness in both of his extremities on the L side. Pt's speech is unchanged from baseline, pt has stutter at baseline. Pt also reports some CP earlier in the day and currently had mild HA. Ax4, NAD at this time.

## 2013-04-07 NOTE — Progress Notes (Signed)
Patient report having chest pressure and stabbing pain in chest non radiating  . vss wnl encouraged pt to lie down pt reported feeling stress regarding his future . Pt just met with his chaplain Gerhard Perches who informed him he would be away for a few days however, has alternative clergy to cover him. Pt started to C/o of left started weakness mouth droop noted, head to right side left hand grasp weak. "I feel weird ,I don't know what's wrong with me speech slurred." EMS called, [facility] notified Berneice Heinrich. [id number] Lamount Cranker, myself along with Inetta Fermo met with EMS and pt was transported to [facility] for assessment

## 2013-04-07 NOTE — [facility] Group Notes (Signed)
[facility] Group Notes:  (Nursing/MHT/Case Management/Adjunct)  Date:  04/07/2013  Time:  11:13 AM  Type of Therapy:  Nurse Education  Participation Level:  Minimal  Participation Quality:  Resistant  Affect:  Irritable  Cognitive:  Appropriate  Insight:  Improving  Engagement in Group:  Limited  Modes of Intervention:  Problem-solving  Summary of Progress/Problems:  Bryan Mcmillan 04/07/2013, 11:13 AM

## 2013-04-08 DIAGNOSIS — F333 Major depressive disorder, recurrent, severe with psychotic symptoms: Secondary | ICD-10-CM

## 2013-04-08 LAB — GLUCOSE, CAPILLARY
Glucose-Capillary: 232 mg/dL — ABNORMAL HIGH (ref 70–99)
Glucose-Capillary: 324 mg/dL — ABNORMAL HIGH (ref 70–99)

## 2013-04-08 MED ORDER — ONDANSETRON 4 MG PO TBDP: 4.0000 mg | ORAL_TABLET | Freq: Three times a day (TID) | ORAL | Status: DC | PRN

## 2013-04-08 MED ORDER — AMPHETAMINE-DEXTROAMPHET ER 10 MG PO CP24
10.0000 mg | ORAL_CAPSULE | Freq: Every day | ORAL | Status: DC
  Administered 2013-04-08 – 2013-04-10 (×3): 10 mg via ORAL
  Filled 2013-04-08 (×3): qty 1

## 2013-04-08 MED ORDER — GABAPENTIN 300 MG PO CAPS
300.0000 mg | ORAL_CAPSULE | Freq: Three times a day (TID) | ORAL | Status: DC
Start: 2013-04-08 — End: 2013-04-10
  Administered 2013-04-08 – 2013-04-10 (×7): 300 mg via ORAL
  Filled 2013-04-08 (×9): qty 1

## 2013-04-08 MED ORDER — LINAGLIPTIN 5 MG PO TABS
5.0000 mg | ORAL_TABLET | Freq: Every day | ORAL | Status: DC
  Administered 2013-04-08 – 2013-04-10 (×3): 5 mg via ORAL
  Filled 2013-04-08 (×4): qty 1

## 2013-04-08 MED ORDER — METFORMIN HCL 500 MG PO TABS
1000.0000 mg | ORAL_TABLET | Freq: Two times a day (BID) | ORAL | Status: DC
  Filled 2013-04-08 (×3): qty 2

## 2013-04-08 MED ORDER — ENOXAPARIN SODIUM 120 MG/0.8ML ~~LOC~~ SOLN
120.0000 mg | Freq: Two times a day (BID) | SUBCUTANEOUS | Status: DC
  Administered 2013-04-08 – 2013-04-10 (×5): 120 mg via SUBCUTANEOUS
  Filled 2013-04-08 (×7): qty 0.8

## 2013-04-08 MED ORDER — ADULT MULTIVITAMIN W/MINERALS CH
1.0000 | ORAL_TABLET | Freq: Every day | ORAL | Status: DC
  Administered 2013-04-08 – 2013-04-10 (×3): 1 via ORAL
  Filled 2013-04-08 (×4): qty 1

## 2013-04-08 MED ORDER — VILAZODONE HCL 40 MG PO TABS
40.0000 mg | ORAL_TABLET | Freq: Every day | ORAL | Status: DC
  Administered 2013-04-08 – 2013-04-10 (×3): 40 mg via ORAL
  Filled 2013-04-08 (×4): qty 1

## 2013-04-08 MED ORDER — ALUM & MAG HYDROXIDE-SIMETH 200-200-20 MG/5ML PO SUSP
30.0000 mL | ORAL | Status: DC | PRN
  Administered 2013-04-10: 30 mL via ORAL

## 2013-04-08 MED ORDER — INSULIN ASPART 100 UNIT/ML ~~LOC~~ SOLN
0.0000 [IU] | Freq: Every day | SUBCUTANEOUS | Status: DC
  Administered 2013-04-08 – 2013-04-09 (×2): 3 [IU] via SUBCUTANEOUS

## 2013-04-08 MED ORDER — METFORMIN HCL 500 MG PO TABS
1000.0000 mg | ORAL_TABLET | Freq: Two times a day (BID) | ORAL | Status: DC
  Administered 2013-04-10: 1000 mg via ORAL
  Filled 2013-04-08 (×3): qty 2

## 2013-04-08 MED ORDER — ACETAMINOPHEN 325 MG PO TABS
650.0000 mg | ORAL_TABLET | Freq: Four times a day (QID) | ORAL | Status: DC | PRN
  Filled 2013-04-08: qty 2

## 2013-04-08 MED ORDER — SIMVASTATIN 10 MG PO TABS
10.0000 mg | ORAL_TABLET | Freq: Every day | ORAL | Status: DC
  Administered 2013-04-08 – 2013-04-09 (×2): 10 mg via ORAL
  Filled 2013-04-08 (×3): qty 1

## 2013-04-08 MED ORDER — ASPIRIN-ACETAMINOPHEN-CAFFEINE 250-250-65 MG PO TABS
1.0000 | ORAL_TABLET | Freq: Four times a day (QID) | ORAL | Status: DC | PRN
  Administered 2013-04-08 – 2013-04-10 (×3): 1 via ORAL
  Filled 2013-04-08 (×3): qty 1

## 2013-04-08 MED ORDER — INSULIN ASPART 100 UNIT/ML ~~LOC~~ SOLN
0.0000 [IU] | Freq: Three times a day (TID) | SUBCUTANEOUS | Status: DC
  Administered 2013-04-08: 15 [IU] via SUBCUTANEOUS
  Administered 2013-04-08 (×2): 7 [IU] via SUBCUTANEOUS
  Administered 2013-04-09: 4 [IU] via SUBCUTANEOUS
  Administered 2013-04-09: 7 [IU] via SUBCUTANEOUS
  Administered 2013-04-09: 11 [IU] via SUBCUTANEOUS
  Administered 2013-04-10: 7 [IU] via SUBCUTANEOUS
  Administered 2013-04-10: 11 [IU] via SUBCUTANEOUS

## 2013-04-08 MED ORDER — TRAZODONE HCL 100 MG PO TABS
100.0000 mg | ORAL_TABLET | Freq: Every evening | ORAL | Status: DC | PRN
Start: 2013-04-08 — End: 2013-04-10
  Administered 2013-04-08 – 2013-04-09 (×2): 100 mg via ORAL
  Filled 2013-04-08: qty 1
  Filled 2013-04-08: qty 7
  Filled 2013-04-08: qty 1

## 2013-04-08 MED ORDER — LEVETIRACETAM 500 MG PO TABS
500.0000 mg | ORAL_TABLET | Freq: Two times a day (BID) | ORAL | Status: DC
  Administered 2013-04-08 – 2013-04-10 (×5): 500 mg via ORAL
  Filled 2013-04-08 (×7): qty 1

## 2013-04-08 MED ORDER — INSULIN GLARGINE 100 UNIT/ML ~~LOC~~ SOLN
40.0000 [IU] | Freq: Every day | SUBCUTANEOUS | Status: DC
  Administered 2013-04-08 – 2013-04-09 (×2): 40 [IU] via SUBCUTANEOUS
  Filled 2013-04-08: qty 10

## 2013-04-08 MED ORDER — MAGNESIUM HYDROXIDE 400 MG/5ML PO SUSP: 30.0000 mL | Freq: Every day | ORAL | Status: DC | PRN

## 2013-04-08 MED ORDER — HYDROXYZINE HCL 50 MG PO TABS
50.0000 mg | ORAL_TABLET | Freq: Four times a day (QID) | ORAL | Status: DC | PRN
  Filled 2013-04-08: qty 1

## 2013-04-08 NOTE — Progress Notes (Signed)
D: Patient initially flat and depressed this morning, c/o pain to his left side from his "episiode" yesterday. Also c/o feeling "depressed" but states he feels its from not receiving his "depression meds" last night. Affect appears very flat and sullen as he states this, though also manages to make jokes and make inappropriate comments towards this Clinical research associate. Meds given as ordered, BS rechecked at 0935 once med orders had been re entered. Blood glucose was 324 at 0935, given 15 units of novolog per sliding scale. A: Meds given as ordered. Support and encouraged offered. Encouraged group attendance and participation. Maintained Q15 min checks for safety. R: Visible in the milieu, interacting with peers well but becomes very flat and sullen when interacting with staff. C/o headache twice this shift and asked for and received prn medication with little relief. Denies SI/HI, AVH.

## 2013-04-08 NOTE — Progress Notes (Signed)
Patient ID: Bryan Mcmillan, male   DOB: 09/12/1970, 42 y.o.   MRN: [id number] The patient returned from the ED. No evidence of a stroke noted. He was given an MRI with contrast, therefore can't be administered his metformin for 48 hours. Went to bed without incident. Wil continue to monitor.

## 2013-04-08 NOTE — [facility] Group Notes (Signed)
[facility] Group Notes: (Clinical Social Work)   04/08/2013      Type of Therapy:  Group Therapy   Participation Level:  Did Not Attend    Nastassia Bazaldua Grossman-Orr, LCSW 04/08/2013, 12:51 PM     

## 2013-04-08 NOTE — Progress Notes (Signed)
[facility] MD Progress Note  04/08/2013 12:16 PM Bryan Mcmillan  MRN:  [id number] Subjective: Feels down, had to go to ER last night for left sided weakness. Objective: Client had chest pain and left sided weakness yesterday. CT and MRI done. (reports reviewed) see consult notes from Dr. Roseanne Reno and Dr. Ethelda Chick. Possible conversion reaction. Still feels down but now more focused on what has happened yesterday. Does not complain of chest pain or weakness today. No paranoia. Triggers of depression at home says talking in therapy session. Diagnosis:   DSM5: Schizophrenia Disorders:  NA Obsessive-Compulsive Disorders:  NA Trauma-Stressor Disorders:  Adjustment Disorder with Depressed Mood (308.03) Substance/Addictive Disorders:  NA Depressive Disorders:  Major Depressive Disorder - Moderate (296.22)  Axis I: Adjustment Disorder with Depressed Mood, Depressive Disorder secondary to general medical condition and Major Depression, Recurrent severe  ADL's:  Intact  Sleep: Fair  Appetite:  Fair  Suicidal Ideation:  Plan:  no Intent:  no Homicidal Ideation:  Plan:  no Intent:  no AEB (as evidenced by):  Psychiatric Specialty Exam: Review of Systems  Constitutional: Negative for fever and chills.  Respiratory: Negative for cough.   Cardiovascular: Negative for chest pain.  Musculoskeletal: Negative for myalgias.  Neurological: Negative for tingling and focal weakness.  Psychiatric/Behavioral: Positive for depression.    Blood pressure 125/76, pulse 98, temperature 98 F (36.7 C), temperature source Oral, resp. rate 19.There is no weight on file to calculate BMI.  General Appearance: Casual  Eye Contact::  Fair  Speech:  Slow  Volume:  Normal  Mood:  Dysphoric  Affect:  Congruent  Thought Process:  Coherent  Orientation:  Full (Time, Place, and Person)  Thought Content:  Rumination  Suicidal Thoughts:  No  Homicidal Thoughts:  No  Memory:  Recent;   Fair  Judgement:  Poor   Insight:  Lacking  Psychomotor Activity:  Decreased  Concentration:  Fair  Recall:  Fair  Akathisia:  No  Handed:  Left  AIMS (if indicated):     Assets:  Desire for Improvement Housing  Sleep:  Number of Hours: 4.75   Current Medications: Current Facility-Administered Medications  Medication Dose Route Frequency Provider Last Rate Last Dose  . acetaminophen (TYLENOL) tablet 650 mg  650 mg Oral Q6H PRN Mojeed Akintayo      . alum & mag hydroxide-simeth (MAALOX/MYLANTA) 200-200-20 MG/5ML suspension 30 mL  30 mL Oral Q4H PRN Mojeed Akintayo      . amphetamine-dextroamphetamine (ADDERALL XR) 24 hr capsule 10 mg  10 mg Oral Daily Mojeed Akintayo   10 mg at 04/08/13 0928  . aspirin-acetaminophen-caffeine (EXCEDRIN MIGRAINE) per tablet 1 tablet  1 tablet Oral Q6H PRN Mojeed Akintayo   1 tablet at 04/08/13 0929  . enoxaparin (LOVENOX) injection 120 mg  120 mg Subcutaneous BID Mojeed Akintayo   120 mg at 04/08/13 0928  . gabapentin (NEURONTIN) capsule 300 mg  300 mg Oral TID Mojeed Akintayo   300 mg at 04/08/13 0928  . hydrOXYzine (ATARAX/VISTARIL) tablet 50 mg  50 mg Oral Q6H PRN Mojeed Akintayo      . insulin aspart (novoLOG) injection 0-20 Units  0-20 Units Subcutaneous TID WC Mojeed Akintayo   7 Units at 04/08/13 1208  . insulin aspart (novoLOG) injection 0-5 Units  0-5 Units Subcutaneous QHS Mojeed Akintayo      . insulin glargine (LANTUS) injection 40 Units  40 Units Subcutaneous QHS Mojeed Akintayo      . levETIRAcetam (KEPPRA) tablet 500 mg  500 mg Oral  BID Mojeed Akintayo   500 mg at 04/08/13 0928  . linagliptin (TRADJENTA) tablet 5 mg  5 mg Oral QAC breakfast Mojeed Akintayo   5 mg at 04/08/13 0928  . magnesium hydroxide (MILK OF MAGNESIA) suspension 30 mL  30 mL Oral Daily PRN Mojeed Akintayo      . metFORMIN (GLUCOPHAGE) tablet 1,000 mg  1,000 mg Oral BID WC Mojeed Akintayo      . multivitamin with minerals tablet 1 tablet  1 tablet Oral Daily Mojeed Akintayo   1 tablet at 04/08/13  0928  . ondansetron (ZOFRAN-ODT) disintegrating tablet 4 mg  4 mg Oral Q8H PRN Mojeed Akintayo      . simvastatin (ZOCOR) tablet 10 mg  10 mg Oral q1800 Mojeed Akintayo      . traZODone (DESYREL) tablet 100 mg  100 mg Oral QHS PRN Mojeed Akintayo      . Vilazodone HCl (VIIBRYD) TABS 40 mg  40 mg Oral Daily Mojeed Akintayo   40 mg at 04/08/13 4098    Lab Results:  Results for orders placed during the hospital encounter of 04/07/13 (from the past 48 hour(s))  GLUCOSE, CAPILLARY     Status: Abnormal   Collection Time    04/08/13  6:18 AM      Result Value Range   Glucose-Capillary 232 (*) 70 - 99 mg/dL   Comment 1 Notify RN    GLUCOSE, CAPILLARY     Status: Abnormal   Collection Time    04/08/13  9:35 AM      Result Value Range   Glucose-Capillary 324 (*) 70 - 99 mg/dL  GLUCOSE, CAPILLARY     Status: Abnormal   Collection Time    04/08/13 12:04 PM      Result Value Range   Glucose-Capillary 244 (*) 70 - 99 mg/dL    Physical Findings: AIMS: Facial and Oral Movements Muscles of Facial Expression: None, normal Lips and Perioral Area: None, normal Jaw: None, normal Tongue: None, normal,Extremity Movements Upper (arms, wrists, hands, fingers): None, normal Lower (legs, knees, ankles, toes): None, normal, Trunk Movements Neck, shoulders, hips: None, normal, Overall Severity Severity of abnormal movements (highest score from questions above): None, normal Incapacitation due to abnormal movements: None, normal, Dental Status Current problems with teeth and/or dentures?: No Does patient usually wear dentures?: No  CIWA:    COWS:     Treatment Plan Summary: Daily contact with patient to assess and evaluate symptoms and progress in treatment Medication management Explore in therapy regarding incident happened yesterday. See Consult notes with no new infarct shown in MRI and CT. Signs of past infarct. Monitor medical conditions and diabetes.   Plan:  See treatment summary    Medical Decision Making Problem Points:  Established problem, worsening (2), New problem, with no additional work-up planned (3), Review of last therapy session (1) and Review of psycho-social stressors (1) Data Points:  Review or order clinical lab tests (1) Review or order medicine tests (1) Review of medication regiment & side effects (2)  I certify that inpatient services furnished can reasonably be expected to improve the patient's condition.   Cherly Erno 04/08/2013, 12:16 PM

## 2013-04-09 DIAGNOSIS — R45850 Homicidal ideations: Secondary | ICD-10-CM

## 2013-04-09 DIAGNOSIS — F332 Major depressive disorder, recurrent severe without psychotic features: Principal | ICD-10-CM

## 2013-04-09 LAB — GLUCOSE, CAPILLARY

## 2013-04-09 NOTE — Progress Notes (Signed)
Seen and agreed. Bryan Chisum, MD 

## 2013-04-09 NOTE — Progress Notes (Signed)
Adult Psychoeducational Group Note  Date:  04/09/2013 Time:  2:01 PM  Group Topic/Focus:  Dimensions of Wellness:   The focus of this group is to introduce the topic of wellness and discuss the role each dimension of wellness plays in total health.  Participation Level:  Did Not Attend  Participation Quality:  did not attend group  Affect:  did not attend group  Cognitive:  did not attend group  Insight: did not attend group  Engagement in Group:  did not attend group  Modes of Intervention:  did not attend group  Additional Comments:  did not attend group  [place] Shi K 04/09/2013, 2:01 PM

## 2013-04-09 NOTE — Progress Notes (Signed)
[facility] Group Notes:  (Nursing/MHT/Case Management/Adjunct)  Date:  04/08/2013 Time:  2000  Type of Therapy:  Psychoeducational Skills  Participation Level:  Active  Participation Quality:  Appropriate  Affect:  Depressed  Cognitive:  Appropriate  Insight:  Improving  Engagement in Group:  Improving  Modes of Intervention:  Education  Summary of Progress/Problems: The patient stated that he had a "hard day". He attributed this to having a great deal of anxiety due in part to the time for which he received his medication. In addition, he verbalized that he is presently homeless and is very concerned . His goal for tomorrow is to speak with someone regarding his social security issues.   Bryan Mcmillan S 04/09/2013, 2:03 AM

## 2013-04-09 NOTE — Progress Notes (Signed)
Patient ID: Bryan Mcmillan, male   DOB: 01/08/1971, 42 y.o.   MRN: [id number]  [facility] MD Progress Note  04/09/2013 11:25 AM [name]  MRN:  [id number] Subjective:"My anxiety is 9/10,  Depression is 6-7/10 and I am no longer homicidal unless when I am taking to my mother." Objective: Patient reports ongoing depressions in form of negative thoughts, says that he is still delusional but ignoring his paranoid thinking. He says he is anxious and worry about going to the shelter since his mother has refused to allow him to return home upon discharge. He is compliant with his medication with no adverse reactions endorsed. Diagnosis:   DSM5: Schizophrenia Disorders:   Obsessive-Compulsive Disorders:   Trauma-Stressor Disorders:   Substance/Addictive Disorders:   Depressive Disorders:  Major Depressive Disorder - Severe (296.23)  Axis I: Major Depression, Recurrent severe  ADL's:  Intact  Sleep: Fair  Appetite:  Fair  Suicidal Ideation: denies  Homicidal Ideation:  Passive HI to hurt mother AEB (as evidenced by):  Psychiatric Specialty Exam: Review of Systems  Constitutional: Negative.   Eyes: Negative.   Respiratory: Negative.   Cardiovascular: Negative.   Gastrointestinal: Negative.   Genitourinary: Negative.   Musculoskeletal: Negative.   Skin: Negative.   Neurological: Positive for tremors and headaches.  Endo/Heme/Allergies: Negative.   Psychiatric/Behavioral: Positive for depression. Negative for hallucinations, memory loss and substance abuse. The patient is nervous/anxious.     Blood pressure 115/79, pulse 92, temperature 97.9 F (36.6 C), temperature source Oral, resp. rate 16.There is no weight on file to calculate BMI.  General Appearance: Fairly Groomed  Patent attorney::  Fair  Speech:  Clear and Coherent  Volume:  Normal  Mood:  Anxious and Depressed  Affect:  Constricted  Thought Process:  Goal Directed  Orientation:  Full (Time, Place, and Person)  Thought  Content:  NA  Suicidal Thoughts:  denies  Homicidal Thoughts:  denies  Memory:  Immediate;   Fair Recent;   Fair Remote;   Fair  Judgement:  Poor  Insight:  Lacking  Psychomotor Activity:  Decreased  Concentration:  Poor  Recall:  Fair  Akathisia:  No  Handed:  Right  AIMS (if indicated):     Assets:  Communication Skills Desire for Improvement Social Support  Sleep:  Number of Hours: 6.75   Current Medications: Current Facility-Administered Medications  Medication Dose Route Frequency Provider Last Rate Last Dose  . acetaminophen (TYLENOL) tablet 650 mg  650 mg Oral Q6H PRN Axten Pascucci      . alum & mag hydroxide-simeth (MAALOX/MYLANTA) 200-200-20 MG/5ML suspension 30 mL  30 mL Oral Q4H PRN Kiaja Shorty      . amphetamine-dextroamphetamine (ADDERALL XR) 24 hr capsule 10 mg  10 mg Oral Daily Emersyn Wyss   10 mg at 04/09/13 0825  . aspirin-acetaminophen-caffeine (EXCEDRIN MIGRAINE) per tablet 1 tablet  1 tablet Oral Q6H PRN Ubah Radke   1 tablet at 04/08/13 1444  . enoxaparin (LOVENOX) injection 120 mg  120 mg Subcutaneous BID Vanette Noguchi   120 mg at 04/09/13 0825  . gabapentin (NEURONTIN) capsule 300 mg  300 mg Oral TID Alaylah Heatherington   300 mg at 04/09/13 0825  . hydrOXYzine (ATARAX/VISTARIL) tablet 50 mg  50 mg Oral Q6H PRN Reva Pinkley      . insulin aspart (novoLOG) injection 0-20 Units  0-20 Units Subcutaneous TID WC Roslyn Else   4 Units at 04/09/13 0650  . insulin aspart (novoLOG) injection 0-5 Units  0-5 Units Subcutaneous  QHS Abdias Hickam   3 Units at 04/08/13 2125  . insulin glargine (LANTUS) injection 40 Units  40 Units Subcutaneous QHS Nakaya Mishkin   40 Units at 04/08/13 2126  . levETIRAcetam (KEPPRA) tablet 500 mg  500 mg Oral BID Jashay Roddy   500 mg at 04/09/13 0824  . linagliptin (TRADJENTA) tablet 5 mg  5 mg Oral QAC breakfast Garrie Elenes   5 mg at 04/09/13 0651  . magnesium hydroxide (MILK OF MAGNESIA) suspension 30 mL  30  mL Oral Daily PRN Zackry Deines      . [START ON 04/10/2013] metFORMIN (GLUCOPHAGE) tablet 1,000 mg  1,000 mg Oral BID WC Naidelyn Parrella      . multivitamin with minerals tablet 1 tablet  1 tablet Oral Daily Lashanna Angelo   1 tablet at 04/09/13 0825  . ondansetron (ZOFRAN-ODT) disintegrating tablet 4 mg  4 mg Oral Q8H PRN Solita Macadam      . simvastatin (ZOCOR) tablet 10 mg  10 mg Oral q1800 Noor Witte   10 mg at 04/08/13 1719  . traZODone (DESYREL) tablet 100 mg  100 mg Oral QHS PRN Dennisha Mouser   100 mg at 04/08/13 2128  . Vilazodone HCl (VIIBRYD) TABS 40 mg  40 mg Oral Daily Deyonna Fitzsimmons   40 mg at 04/09/13 0825    Lab Results:  Results for orders placed during the hospital encounter of 04/07/13 (from the past 48 hour(s))  GLUCOSE, CAPILLARY     Status: Abnormal   Collection Time    04/07/13 10:37 PM      Result Value Range   Glucose-Capillary 120 (*) 70 - 99 mg/dL   Comment 1 Notify RN    GLUCOSE, CAPILLARY     Status: Abnormal   Collection Time    04/08/13  6:18 AM      Result Value Range   Glucose-Capillary 232 (*) 70 - 99 mg/dL   Comment 1 Notify RN    GLUCOSE, CAPILLARY     Status: Abnormal   Collection Time    04/08/13  9:35 AM      Result Value Range   Glucose-Capillary 324 (*) 70 - 99 mg/dL  GLUCOSE, CAPILLARY     Status: Abnormal   Collection Time    04/08/13 12:04 PM      Result Value Range   Glucose-Capillary 244 (*) 70 - 99 mg/dL  GLUCOSE, CAPILLARY     Status: Abnormal   Collection Time    04/08/13  5:06 PM      Result Value Range   Glucose-Capillary 208 (*) 70 - 99 mg/dL  GLUCOSE, CAPILLARY     Status: Abnormal   Collection Time    04/08/13  9:13 PM      Result Value Range   Glucose-Capillary 287 (*) 70 - 99 mg/dL  GLUCOSE, CAPILLARY     Status: Abnormal   Collection Time    04/09/13  6:13 AM      Result Value Range   Glucose-Capillary 189 (*) 70 - 99 mg/dL   Comment 1 Notify RN      Physical Findings: AIMS: Facial and Oral  Movements Muscles of Facial Expression: None, normal Lips and Perioral Area: None, normal Jaw: None, normal Tongue: None, normal,Extremity Movements Upper (arms, wrists, hands, fingers): None, normal Lower (legs, knees, ankles, toes): None, normal, Trunk Movements Neck, shoulders, hips: None, normal, Overall Severity Severity of abnormal movements (highest score from questions above): None, normal Incapacitation due to abnormal movements: None, normal,  Dental Status Current problems with teeth and/or dentures?: No Does patient usually wear dentures?: No  CIWA:    COWS:     Treatment Plan Summary: Daily contact with patient to assess and evaluate symptoms and progress in treatment Medication management  Plan: Continue crisis management and stabilization.  Medication management:Continue Vibryd, Keppra and Trazadone. Participate in group counseling sessions and activities.  Continue current medication regimen. Medical Decision Making Problem Points:  Established problem, stable/improving (1), Review of last therapy session (1) and Review of psycho-social stressors (1) Data Points:  Review or order clinical lab tests (1) Review of medication regiment & side effects (2)  I certify that inpatient services furnished can reasonably be expected to improve the patient's condition.   Thedore Mins, MD 04/09/2013, 11:25 AM

## 2013-04-09 NOTE — Progress Notes (Signed)
D: Pt denies SI/HI/AVH. Pt agitated and anxious this morning. Pt stated that he do not like going to groups and to the cafeteria. He stated that the people are loud and  it makes him want to hurt their feelings. Pt seen pacing in the hallway and fidgety. Pt offered prn vistaril. Pt refused med and stated that he wants to use other methods to dealing with his anxiety and not depend on meds. A: Medications administered as ordered per MD. Verbal support given. Pt encouraged to attend groups. 15 minute checks performed for safety. R: Pt presents with flat affect. Depressed mood. Pt can be manipulating at times. Poor insight.

## 2013-04-09 NOTE — Progress Notes (Signed)
Recreation Therapy Notes  Date: 09.08.2014 Time: 9:30am Location: [address] Dayroom  Group Topic: Wellness  Goal Area(s) Addresses:  Patient will define components of whole wellness. Patient will verbalize benefit of whole wellness.  Behavioral Response: Engaged, Attentive, Appropriate  Intervention: Air traffic controller  Activity: [employer]. Patients were given a worksheet with the six dimensions of wellness: Physical, Social, Intellectual, Emotional, Environmental, and Spiritual. Patients were asked to identify two ways they address each dimension.   Education: Wellness, Building control surveyor.   Education Outcome: Acknowledges understanding.   Clinical Observations/Feedback: Patient made no contributions to opening discussion, but appeared to listen as he maintained appropriate eye contact with speaker. Patient completed worksheet as requested, but chose not to share with group. Patient contributed to group discussion using peer example for social wellness. Patient contributed to group discussion sparked by a statement made about water. Patient shared ideas and discussed ideas of other appropriately. Patient related the balance discussed amongst group members about water and land back to activity and balance needed to make people wholly well.   Marykay Lex Lucely Leard, LRT/CTRS  Jearl Klinefelter 04/09/2013 4:38 PM

## 2013-04-09 NOTE — Progress Notes (Signed)
Patient ID: Bryan Mcmillan, male   DOB: 1970/09/10, 42 y.o.   MRN: [id number] D. The patient had a depressed mood and flat affect. Needs frequent contact with staff to ventilate.  A. Verbal support given. Encouraged to attend evening group. Administered medication. R. Attended and actively participated in evening group. CBG=287. Compliant with medication.

## 2013-04-09 NOTE — Progress Notes (Signed)
Pt resting in bed with eyes closed.  No distress observed.  Respirations even/unlabored.  Safety maintained with q15 minute checks. 

## 2013-04-09 NOTE — Progress Notes (Addendum)
Counseling interns Bryan Mcmillan and Bryan Mcmillan met with patient on Monday to learn more about how the patient was coping.  Patient indicated feeling very distressed and anxious. He mentioned that he has had more episodes of panic and depression in the past week than he can remember in recent years.  Patient indicated that the noise and movement in the hall contributed to his anxiety. Patient also mentioned that he is better able to control these feelings of anxiety or panic when he is able to focus his mind on a particular thing. He offered examples of making lists of things he could fix in the hospital, having a job, or talking about his daughter.   During the conversation, Bryan Mcmillan asked about his family and the events that lead up to his hospitilzation.  Patient indicated that his relationship with his family exacerbates his anxiety.    Based on the patient's report, it maybe unsafe for him and his family to return home. It seems that patient's family may be (un)knowingly escalating patient's anxiety.  Patient indicated that he feels like a danger to himself and his family in his family's home.

## 2013-04-09 NOTE — [facility] Group Notes (Signed)
[facility] LCSW Group Therapy  04/09/2013 1:15 pm  Type of Therapy: Process Group Therapy  Participation Level:  Active  Participation Quality:  Appropriate  Affect:  Flat  Cognitive:  Oriented  Insight:  Improving  Engagement in Group:  Limited  Engagement in Therapy:  Limited  Modes of Intervention:  Activity, Clarification, Education, Problem-solving and Support  Summary of Progress/Problems: Today's group addressed the issue of overcoming obstacles.  Patients were asked to identify their biggest obstacle post d/c that stands in the way of their on-going success, and then problem solve as to how to manage this. Bryan Mcmillan was in group initially, and talked about finding unbiased reporting in the media as a big challenge for him.  This was in response to another patient's paranoid comments about messages he was receiving through the TV.  Not long after [name] left as he was overwhelmed by the chaos in the room.  Many people were attempting to give feedback to the same patient at the same time.  [name] 04/09/2013   4:09 PM

## 2013-04-10 DIAGNOSIS — F411 Generalized anxiety disorder: Secondary | ICD-10-CM

## 2013-04-10 LAB — GLUCOSE, CAPILLARY: Glucose-Capillary: 210 mg/dL — ABNORMAL HIGH (ref 70–99)

## 2013-04-10 MED ORDER — METFORMIN HCL 1000 MG PO TABS: 1000.0000 mg | ORAL_TABLET | Freq: Two times a day (BID) | ORAL | Status: DC

## 2013-04-10 MED ORDER — SITAGLIPTIN PHOSPHATE 100 MG PO TABS: 100.0000 mg | ORAL_TABLET | Freq: Every day | ORAL | Status: DC

## 2013-04-10 MED ORDER — AMPHETAMINE-DEXTROAMPHET ER 10 MG PO CP24: 10.0000 mg | ORAL_CAPSULE | Freq: Every day | ORAL | Status: DC

## 2013-04-10 MED ORDER — LEVETIRACETAM 500 MG PO TABS: 500.0000 mg | ORAL_TABLET | Freq: Two times a day (BID) | ORAL | Status: DC

## 2013-04-10 MED ORDER — ASPIRIN-ACETAMINOPHEN-CAFFEINE 250-250-65 MG PO TABS: 1.0000 | ORAL_TABLET | Freq: Four times a day (QID) | ORAL | Status: DC | PRN

## 2013-04-10 MED ORDER — ENOXAPARIN SODIUM 150 MG/ML ~~LOC~~ SOLN: 150.0000 mg | Freq: Two times a day (BID) | SUBCUTANEOUS | Status: DC

## 2013-04-10 MED ORDER — PRAVASTATIN SODIUM 80 MG PO TABS: 80.0000 mg | ORAL_TABLET | Freq: Every day | ORAL | Status: DC

## 2013-04-10 MED ORDER — VILAZODONE HCL 40 MG PO TABS: 40.0000 mg | ORAL_TABLET | Freq: Every day | ORAL | Status: DC

## 2013-04-10 MED ORDER — TRAZODONE HCL 100 MG PO TABS: 100.0000 mg | ORAL_TABLET | Freq: Every evening | ORAL | Status: DC | PRN

## 2013-04-10 MED ORDER — GABAPENTIN 300 MG PO CAPS: 300.0000 mg | ORAL_CAPSULE | Freq: Three times a day (TID) | ORAL | Status: DC

## 2013-04-10 MED ORDER — INSULIN GLARGINE 100 UNIT/ML ~~LOC~~ SOLN: 40.0000 [IU] | Freq: Every day | SUBCUTANEOUS | Status: DC

## 2013-04-10 MED ORDER — HYDROXYZINE HCL 25 MG PO TABS
25.0000 mg | ORAL_TABLET | ORAL | Status: DC | PRN
  Filled 2013-04-10: qty 10

## 2013-04-10 NOTE — Progress Notes (Signed)
Discharge note: Pt received verbal and written discharge instructions. Pt agreed to f/u appt and med regimen. Pt denies SI/HI at this time. Pt received all belongings from room and locker. Pt received sample meds and prescriptions. Pt verbalized understanding of how to take his meds. Pt safely left [facility].

## 2013-04-10 NOTE — [facility] Suicide Risk Assessment (Signed)
Suicide Risk Assessment  Discharge Assessment     Demographic Factors:  Male, Low socioeconomic status and Unemployed  Mental Status Per Nursing Assessment::   On Admission:     Current Mental Status by Physician: patient to denies suicidal ideation, intent or plan  Loss Factors: Decrease in vocational status and Financial problems/change in socioeconomic status  Historical Factors: Impulsivity  Risk Reduction Factors:   Sense of responsibility to family and Living with another person, especially a relative  Continued Clinical Symptoms:  Resolving anxiety and depressive symptoms  Cognitive Features That Contribute To Risk:  Closed-mindedness Polarized thinking    Suicide Risk:  Minimal: No identifiable suicidal ideation.  Patients presenting with no risk factors but with morbid ruminations; may be classified as minimal risk based on the severity of the depressive symptoms  Discharge Diagnoses:   AXIS I:  Major depressive disorder-recurrent severe  Generalized anxiety disorder  AXIS II:  Deferred AXIS III:   Past Medical History  Diagnosis Date  . Asthma   . Depression   . Diabetes   . Headache(784.0)     frequent  . Migraines   . History of blood clots   . Hyperlipidemia   . Obesity   . Sleep apnea   . DVT (deep venous thrombosis), hx of recurrent 05/23/2012  . History of blood clot in brain, 2012 - followed by [facility] 05/23/2012  . Seizure disorder - followed by [facility] 05/23/2012  . Hyperlipemia 08/25/2012  . Anxiety   . Tuberculosis   . Pneumonia   . Kidney disease   . Kidney stones   . Seizure    AXIS IV:  economic problems, housing problems, other psychosocial or environmental problems, problems related to social environment and problems with primary support group AXIS V:  61-70 mild symptoms  Plan Of Care/Follow-up recommendations:  Activity:  as tolerated Diet:  healthy Tests:  routine Other:  patient to keep his after care  appointment  Is patient on multiple antipsychotic therapies at discharge:  No   Has Patient had three or more failed trials of antipsychotic monotherapy by history:  No  Recommended Plan for Multiple Antipsychotic Therapies: N/A  Thedore Mins, MD 04/10/2013, 10:30 AM

## 2013-04-10 NOTE — Progress Notes (Signed)
[facility] Case Management Discharge Plan :  Will you be returning to the same living situation after discharge: No. At discharge, do you have transportation home?:Yes,  friend Do you have the ability to pay for your medications:Yes,  mental health  Release of information consent forms completed and in the chart;  Patient's signature needed at discharge.  Patient to Follow up at: Follow-up Information   Follow up with [facility]. (Go to the walk-in clinic M-F between 8 and 9AM for your hospital follow up appointment)    Contact information:   [address]  [place]  [phone]      Follow up with [facility] On 04/25/2013. (10:30 AM)    Contact information:   [address] [facility]  [phone]      Patient denies SI/HI:   Yes,  yes    Safety Planning and Suicide Prevention discussed:  Yes,  yes  Bryan Mcmillan 04/10/2013, 12:39 PM

## 2013-04-10 NOTE — Progress Notes (Signed)
Patient ID: Bryan Mcmillan, male   DOB: [birth year], 42 y.o.   MRN: [id number]   D: Writer introduced self to the pt and asked pt about his day. Pt stated, "no offense to you, but I've tried to discuss this with several people and none have helped me".  Writer encouraged pt to talk and informed that if she couldn't help that maybe she could bring awareness to the situation. Pt began to speak about his Dr stating the Dr inappropriate using politics,and also made him feel like he was being discriminated against. Pt stated, "no offense but I felt like a slave". Pt stated he was told that his payer stopped paying on the 5th of Sept and he's wondering how or who will be responsible for the rest of his days at [facility].  Encouraged pt to speak to his Dr.   Mervyn Skeeters:  Support and encouragement was offered. 15 min checks continued for safety.  R: Pt remains safe.

## 2013-04-10 NOTE — Tx Team (Signed)
  Interdisciplinary Treatment Plan Update   Date Reviewed:  04/10/2013  Time Reviewed:  12:40 PM  Progress in Treatment:   Attending groups: Yes Participating in groups: Yes Taking medication as prescribed: Yes  Tolerating medication: Yes Family/Significant other contact made: Yes  Patient understands diagnosis: Yes  Discussing patient identified problems/goals with staff: Yes Medical problems stabilized or resolved: Yes Denies suicidal/homicidal ideation: Yes Patient has not harmed self or others: Yes  For review of initial/current patient goals, please see plan of care.  Estimated Length of Stay:  D/C today  Reason for Continuation of Hospitalization:   New Problems/Goals identified:  N/A  Discharge Plan or Barriers:   go to [employer], follow up outpt  Additional Comments:  Attendees:  Signature: Thedore Mins, MD 04/10/2013 12:40 PM   Signature: Richelle Ito, LCSW 04/10/2013 12:40 PM  Signature: Fransisca Kaufmann, NP 04/10/2013 12:40 PM  Signature: Joslyn Devon, RN 04/10/2013 12:40 PM  Signature: Liborio Nixon, RN 04/10/2013 12:40 PM  Signature:  04/10/2013 12:40 PM  Signature:   04/10/2013 12:40 PM  Signature:    Signature:    Signature:    Signature:    Signature:    Signature:      Scribe for Treatment Team:   Richelle Ito, LCSW  04/10/2013 12:40 PM
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
## 2013-04-13 NOTE — Discharge Summary (Signed)
Seen and agreed. Musab Wingard, MD 

## 2013-04-13 NOTE — Progress Notes (Signed)
Patient Discharge Instructions:  After Visit Summary (AVS):   Faxed to:  04/13/13 Discharge Summary Note:   Faxed to:  04/13/13 Psychiatric Admission Assessment Note:   Faxed to:  04/13/13 Suicide Risk Assessment - Discharge Assessment:   Faxed to:  04/13/13 Faxed/Sent to the Next Level Care provider:  04/13/13 Next Level Care Provider Has Access to the EMR, 04/13/13 Faxed to [facility] @ [phone] Records provided to [facility] and Wellness via [facility]/Epic access.  Jerelene Redden, 04/13/2013, 5:00 PM

## 2013-04-25 ENCOUNTER — Encounter: Payer: Self-pay | Admitting: Internal Medicine

## 2013-04-25 ENCOUNTER — Ambulatory Visit: Payer: Medicaid Other | Attending: Internal Medicine | Admitting: Internal Medicine

## 2013-04-25 VITALS — BP 111/70 | HR 73 | Temp 98.9°F | Resp 16 | Ht 71.0 in | Wt 273.0 lb

## 2013-04-25 DIAGNOSIS — Z86711 Personal history of pulmonary embolism: Secondary | ICD-10-CM | POA: Insufficient documentation

## 2013-04-25 DIAGNOSIS — E1142 Type 2 diabetes mellitus with diabetic polyneuropathy: Secondary | ICD-10-CM | POA: Insufficient documentation

## 2013-04-25 DIAGNOSIS — Z85841 Personal history of malignant neoplasm of brain: Secondary | ICD-10-CM | POA: Insufficient documentation

## 2013-04-25 DIAGNOSIS — G40909 Epilepsy, unspecified, not intractable, without status epilepticus: Secondary | ICD-10-CM

## 2013-04-25 DIAGNOSIS — I2699 Other pulmonary embolism without acute cor pulmonale: Secondary | ICD-10-CM

## 2013-04-25 DIAGNOSIS — Z86718 Personal history of other venous thrombosis and embolism: Secondary | ICD-10-CM | POA: Insufficient documentation

## 2013-04-25 DIAGNOSIS — IMO0001 Reserved for inherently not codable concepts without codable children: Secondary | ICD-10-CM

## 2013-04-25 DIAGNOSIS — E785 Hyperlipidemia, unspecified: Secondary | ICD-10-CM

## 2013-04-25 DIAGNOSIS — I82409 Acute embolism and thrombosis of unspecified deep veins of unspecified lower extremity: Secondary | ICD-10-CM

## 2013-04-25 DIAGNOSIS — E1149 Type 2 diabetes mellitus with other diabetic neurological complication: Secondary | ICD-10-CM | POA: Insufficient documentation

## 2013-04-25 DIAGNOSIS — E1165 Type 2 diabetes mellitus with hyperglycemia: Secondary | ICD-10-CM

## 2013-04-25 DIAGNOSIS — G43909 Migraine, unspecified, not intractable, without status migrainosus: Secondary | ICD-10-CM

## 2013-04-25 DIAGNOSIS — E119 Type 2 diabetes mellitus without complications: Secondary | ICD-10-CM

## 2013-04-25 MED ORDER — ASPIRIN-ACETAMINOPHEN-CAFFEINE 250-250-65 MG PO TABS: 1.0000 | ORAL_TABLET | Freq: Four times a day (QID) | ORAL | Status: DC | PRN

## 2013-04-25 MED ORDER — ENOXAPARIN SODIUM 150 MG/ML ~~LOC~~ SOLN: 150.0000 mg | Freq: Two times a day (BID) | SUBCUTANEOUS | Status: DC

## 2013-04-25 MED ORDER — AMPHETAMINE-DEXTROAMPHET ER 10 MG PO CP24: 10.0000 mg | ORAL_CAPSULE | Freq: Every day | ORAL | Status: DC

## 2013-04-25 MED ORDER — SITAGLIPTIN PHOSPHATE 100 MG PO TABS: 100.0000 mg | ORAL_TABLET | Freq: Every day | ORAL | Status: DC

## 2013-04-25 MED ORDER — LEVETIRACETAM 500 MG PO TABS: 500.0000 mg | ORAL_TABLET | Freq: Two times a day (BID) | ORAL | Status: DC

## 2013-04-25 MED ORDER — TRAZODONE 25 MG HALF TABLET: 25.0000 mg | ORAL_TABLET | Freq: Every evening | ORAL | Status: DC | PRN

## 2013-04-25 MED ORDER — HYDROXYZINE HCL 25 MG PO TABS: 25.0000 mg | ORAL_TABLET | ORAL | Status: DC | PRN

## 2013-04-25 MED ORDER — VILAZODONE HCL 40 MG PO TABS: 40.0000 mg | ORAL_TABLET | Freq: Every day | ORAL | Status: DC

## 2013-04-25 MED ORDER — METFORMIN HCL 1000 MG PO TABS: 1000.0000 mg | ORAL_TABLET | Freq: Two times a day (BID) | ORAL | Status: DC

## 2013-04-25 MED ORDER — PRAVASTATIN SODIUM 80 MG PO TABS: 80.0000 mg | ORAL_TABLET | Freq: Every day | ORAL | Status: DC

## 2013-04-25 MED ORDER — GABAPENTIN 300 MG PO CAPS: 300.0000 mg | ORAL_CAPSULE | Freq: Three times a day (TID) | ORAL | Status: DC

## 2013-04-25 MED ORDER — INSULIN GLARGINE 100 UNIT/ML ~~LOC~~ SOLN: 40.0000 [IU] | Freq: Every day | SUBCUTANEOUS | Status: DC

## 2013-04-25 NOTE — Patient Instructions (Signed)
Diabetes, Frequently Asked Questions  WHAT IS DIABETES?  Most of the food we eat is turned into glucose (sugar). Our bodies use it for energy. The pancreas makes a hormone called insulin. It helps glucose get into the cells of our bodies. When you have diabetes, your body either does not make enough insulin or cannot use its own insulin as well as it should. This causes sugars to build up in your blood.  WHAT ARE THE SYMPTOMS OF DIABETES?   Frequent urination.   Excessive thirst.   Unexplained weight loss.   Extreme hunger.   Blurred vision.   Tingling or numbness in hands or feet.   Feeling very tired much of the time.   Dry, itchy skin.   Sores that are slow to heal.   Yeast infections.  WHAT ARE THE TYPES OF DIABETES?  Type 1 Diabetes    About 10% of affected people have this type.   Usually occurs before the age of 30.   Usually occurs in thin to normal weight people.  Type 2 Diabetes   About 90% of affected people have this type.   Usually occurs after the age of 40.   Usually occurs in overweight people.   More likely to have:   A family history of diabetes.   A history of diabetes during pregnancy (gestational diabetes).   High blood pressure.   High cholesterol and triglycerides.  Gestational Diabetes   Occurs in about 4% of pregnancies.   Usually goes away after the baby is born.   More likely to occur in women with:   Family history of diabetes.   Previous gestational diabetes.   Obese.   Over 25 years old.  WHAT IS PRE-DIABETES?  Pre-diabetes means your blood glucose is higher than normal, but lower than the diabetes range. It also means you are at risk of getting type 2 diabetes and heart disease. If you are told you have pre-diabetes, have your blood glucose checked again in 1 to 2 years.  WHAT IS THE TREATMENT FOR DIABETES?   Treatment is aimed at keeping blood glucose near normal levels at all times. Learning how to manage this yourself is important in treating diabetes. Depending on the type of diabetes you have, your treatment will include one or more of the following:   Monitoring your blood glucose.   Meal planning.   Exercise.   Oral medicine (pills) or insulin.  CAN DIABETES BE PREVENTED?  With type 1 diabetes, prevention is more difficult, because the triggers that cause it are not yet known.  With type 2 diabetes, prevention is more likely, with lifestyle changes:   Maintain a healthy weight.   Eat healthy.   Exercise.  IS THERE A CURE FOR DIABETES?  No, there is no cure for diabetes. There is a lot of research going on that is looking for a cure, and progress is being made. Diabetes can be treated and controlled. People with diabetes can manage their diabetes and lead normal, active lives.  SHOULD I BE TESTED FOR DIABETES?  If you are at least 42 years old, you should be tested for diabetes. You should be tested again every 3 years. If you are 45 or older and overweight, you may want to get tested more often. If you are younger than 45, overweight, and have one or more of the following risk factors, you should be tested:   Family history of diabetes.   Inactive lifestyle.   High blood pressure.    WHAT ARE SOME OTHER SOURCES FOR INFORMATION ON DIABETES?  The following organizations may help in your search for more information on diabetes:  National Diabetes Education Program (NDEP)  Internet: http://www.ndep.nih.gov/resources  American Diabetes Association  Internet: http://www.diabetes.org   Juvenile Diabetes Foundation International  Internet: http://www.jdf.org  Document Released: 07/22/2003 Document Revised: 10/11/2011 Document Reviewed: 05/16/2009  ExitCare Patient Information 2014 [employer].

## 2013-04-25 NOTE — Progress Notes (Signed)
Patient ID: Bryan Mcmillan, male   DOB: May 05, 1971, 42 y.o.   MRN: [id number]  CC: Followup  HPI: 42 year old male with past medical history history significant for brain glioma, pulmonary embolism and DVT on Lovenox, seizure disorder, dyslipidemia, depression and diabetes who presented to clinic for followup. Patient reported being too sleepy throughout the day and having headaches at nighttime. He uses CPAP at nighttime on and off. He lives in a shelter and reports being afraid that somebody will steal his CPAP. He also takes trazodone for sleep but reports that 100 mg at nighttime is too strong of a dose. No chest pain. No loss of consciousness. No blurry vision.  Allergies  Allergen Reactions  . Penicillins Other (See Comments)    "childhood reaction"   Past Medical History  Diagnosis Date  . Asthma   . Depression   . Diabetes   . Headache(784.0)     frequent  . Migraines   . History of blood clots   . Hyperlipidemia   . Obesity   . Sleep apnea   . DVT (deep venous thrombosis), hx of recurrent 05/23/2012  . History of blood clot in brain, 2012 - followed by [facility] 05/23/2012  . Seizure disorder - followed by [facility] 05/23/2012  . Hyperlipemia 08/25/2012  . Anxiety   . Tuberculosis   . Pneumonia   . Kidney disease   . Kidney stones   . Seizure    No current outpatient prescriptions on file prior to visit.   No current facility-administered medications on file prior to visit.   Family History  Problem Relation Age of Onset  . Hyperlipidemia      parent  . Heart disease      parent  . Stroke      parent  . Diabetes      grandparent /parent  . Obesity Other   . Heart attack Other    History   Social History  . Marital Status: Single    Spouse Name: N/A    Number of Children: 1  . Years of Education: BA   Occupational History  . [employer]UST [employer]   Social History Main Topics  . Smoking status: Former Games developer  . Smokeless tobacco:  Not on file  . Alcohol Use: No  . Drug Use: No  . Sexual Activity: No   Other Topics Concern  . Not on file   Social History Narrative   Work or School: [employer] Situation: lives with sister and mother      Spiritual Beliefs:      Lifestyle: trying to walk and working on diet      Caffeine Use: does consume             Review of Systems  Constitutional: Negative for fever, chills, diaphoresis, activity change, appetite change and fatigue.  HENT: Negative for ear pain, nosebleeds, congestion, facial swelling, rhinorrhea, neck pain, neck stiffness and ear discharge.   Eyes: Negative for pain, discharge, redness, itching and visual disturbance.  Respiratory: Negative for cough, choking, chest tightness, shortness of breath, wheezing and stridor.   Cardiovascular: Negative for chest pain, palpitations and leg swelling.  Gastrointestinal: Negative for abdominal distention.  Genitourinary: Negative for dysuria, urgency, frequency, hematuria, flank pain, decreased urine volume, difficulty urinating and dyspareunia.  Musculoskeletal: Negative for back pain, joint swelling, arthralgias and gait problem.  Neurological: Negative for dizziness, tremors, seizures, syncope, facial asymmetry, speech difficulty, weakness,  light-headedness, numbness and positive for nighttime headaches.  Hematological: Negative for adenopathy. Does not bruise/bleed easily.  Psychiatric/Behavioral: Negative for hallucinations, behavioral problems, confusion, dysphoric mood, decreased concentration and agitation.    Objective:   Filed Vitals:   04/25/13 1031  BP: 111/70  Pulse: 73  Temp: 98.9 F (37.2 C)  Resp: 16    Physical Exam  Constitutional: Appears well-developed and well-nourished. No distress.  HENT: Normocephalic. External right and left ear normal. Oropharynx is clear and moist.  Eyes: Conjunctivae and EOM are normal. PERRLA, no scleral icterus.  Neck: Normal ROM. Neck  supple. No JVD. No tracheal deviation. No thyromegaly.  CVS: RRR, S1/S2 +, no murmurs, no gallops, no carotid bruit.  Pulmonary: Effort and breath sounds normal, no stridor, rhonchi, wheezes, rales.  Abdominal: Soft. BS +,  no distension, tenderness, rebound or guarding.  Musculoskeletal: Normal range of motion. No edema and no tenderness.  Lymphadenopathy: No lymphadenopathy noted, cervical, inguinal. Neuro: Alert. Normal reflexes, muscle tone coordination. No cranial nerve deficit. Skin: Skin is warm and dry. No rash noted. Not diaphoretic. No erythema. No pallor.  Psychiatric: Normal mood and affect. Behavior, judgment, thought content normal.   Lab Results  Component Value Date   WBC 9.7 04/07/2013   HGB 16.0 04/07/2013   HCT 47.0 04/07/2013   MCV 84.8 04/07/2013   PLT 281 04/07/2013   Lab Results  Component Value Date   CREATININE 1.10 04/07/2013   BUN 20 04/07/2013   NA 141 04/07/2013   K 4.3 04/07/2013   CL 103 04/07/2013   CO2 25 04/07/2013    Lab Results  Component Value Date   HGBA1C 11.5* 11/24/2012   Lipid Panel     Component Value Date/Time   CHOL 167 11/24/2012 0921   TRIG 143.0 11/24/2012 0921   HDL 28.70* 11/24/2012 0921   CHOLHDL 6 11/24/2012 0921   VLDL 28.6 11/24/2012 0921   LDLCALC 110* 11/24/2012 0921       Assessment and plan:   Patient Active Problem List   Diagnosis Date Noted  . Hyperlipemia 08/25/2012    Priority: Medium - Continue statin therapy  - Recheck lipid panel in October 2014   . Migraine headache  08/25/2012    Priority: Medium - Takes Excedrin only as needed  - Also possible obstructive sleep apnea and patient is on CPAP.  - We have advised to take lower dose of trazodone for sleep, 25 mg at bedtime instead of 100. Trazodone could have also contributed to ongoing headaches.   . DM (diabetes mellitus), type 2, uncontrolled 08/25/2012    Priority: Medium - A1c 11.8 on this visit  - We will increase Lantus to 45 units into her 40 units daily  -  Continue taking metformin and Januvia   . DVT (deep venous thrombosis), hx of recurrent 05/23/2012    Priority: Medium - Continue Lovenox   . Pulmonary embolism 05/23/2012    Priority: Medium - Continue Lovenox   . Seizure disorder - followed by [facility] 05/23/2012    Priority: Medium  .  Diabetic neuropathy  - Continue gabapentin  03/20/2013

## 2013-04-25 NOTE — Progress Notes (Signed)
PT HERE TO ESTABLISH CARE FOR MULTIPLE MEDICAL PROBLEMS REFERRED BY BEHAVIORAL HEALTH CBG YESTERDAY 187

## 2013-05-16 ENCOUNTER — Telehealth: Payer: Self-pay | Admitting: Emergency Medicine

## 2013-05-16 NOTE — Telephone Encounter (Signed)
Pt requesting bed rest order in the [facility], due to increased fatigue and foot pain due to neuropathy. States he was seen by Dr. Elisabeth Pigeon and given note but needs something more specific.

## 2013-06-07 ENCOUNTER — Other Ambulatory Visit: Payer: Self-pay

## 2013-06-11 ENCOUNTER — Ambulatory Visit: Payer: Self-pay

## 2013-07-11 ENCOUNTER — Emergency Department (HOSPITAL_COMMUNITY)
Admission: EM | Admit: 2013-07-11 | Discharge: 2013-07-11 | Disposition: A | Discharge status: Home / Self Care | Payer: Medicaid Other | Attending: Emergency Medicine | Admitting: Emergency Medicine

## 2013-07-11 ENCOUNTER — Emergency Department (HOSPITAL_COMMUNITY): Payer: Medicaid Other

## 2013-07-11 DIAGNOSIS — Z79899 Other long term (current) drug therapy: Secondary | ICD-10-CM | POA: Insufficient documentation

## 2013-07-11 DIAGNOSIS — F329 Major depressive disorder, single episode, unspecified: Secondary | ICD-10-CM | POA: Insufficient documentation

## 2013-07-11 DIAGNOSIS — Z880 Allergy status to penicillin: Secondary | ICD-10-CM | POA: Insufficient documentation

## 2013-07-11 DIAGNOSIS — G40909 Epilepsy, unspecified, not intractable, without status epilepticus: Secondary | ICD-10-CM | POA: Insufficient documentation

## 2013-07-11 DIAGNOSIS — Z86718 Personal history of other venous thrombosis and embolism: Secondary | ICD-10-CM | POA: Insufficient documentation

## 2013-07-11 DIAGNOSIS — Z87891 Personal history of nicotine dependence: Secondary | ICD-10-CM | POA: Insufficient documentation

## 2013-07-11 DIAGNOSIS — R4789 Other speech disturbances: Secondary | ICD-10-CM | POA: Insufficient documentation

## 2013-07-11 DIAGNOSIS — F3289 Other specified depressive episodes: Secondary | ICD-10-CM | POA: Insufficient documentation

## 2013-07-11 DIAGNOSIS — Z87448 Personal history of other diseases of urinary system: Secondary | ICD-10-CM | POA: Insufficient documentation

## 2013-07-11 DIAGNOSIS — Z87442 Personal history of urinary calculi: Secondary | ICD-10-CM | POA: Insufficient documentation

## 2013-07-11 DIAGNOSIS — Z794 Long term (current) use of insulin: Secondary | ICD-10-CM | POA: Insufficient documentation

## 2013-07-11 DIAGNOSIS — Z8611 Personal history of tuberculosis: Secondary | ICD-10-CM | POA: Insufficient documentation

## 2013-07-11 DIAGNOSIS — E1165 Type 2 diabetes mellitus with hyperglycemia: Secondary | ICD-10-CM

## 2013-07-11 DIAGNOSIS — E669 Obesity, unspecified: Secondary | ICD-10-CM | POA: Insufficient documentation

## 2013-07-11 DIAGNOSIS — IMO0001 Reserved for inherently not codable concepts without codable children: Secondary | ICD-10-CM | POA: Insufficient documentation

## 2013-07-11 DIAGNOSIS — J45909 Unspecified asthma, uncomplicated: Secondary | ICD-10-CM | POA: Insufficient documentation

## 2013-07-11 DIAGNOSIS — Z8701 Personal history of pneumonia (recurrent): Secondary | ICD-10-CM | POA: Insufficient documentation

## 2013-07-11 DIAGNOSIS — F411 Generalized anxiety disorder: Secondary | ICD-10-CM | POA: Insufficient documentation

## 2013-07-11 DIAGNOSIS — R479 Unspecified speech disturbances: Secondary | ICD-10-CM | POA: Diagnosis present

## 2013-07-11 DIAGNOSIS — E785 Hyperlipidemia, unspecified: Secondary | ICD-10-CM | POA: Insufficient documentation

## 2013-07-11 DIAGNOSIS — G43909 Migraine, unspecified, not intractable, without status migrainosus: Secondary | ICD-10-CM | POA: Insufficient documentation

## 2013-07-11 LAB — URINE MICROSCOPIC-ADD ON

## 2013-07-11 LAB — URINALYSIS, ROUTINE W REFLEX MICROSCOPIC
Glucose, UA: >1000 mg/dL — AB
Hgb urine dipstick: NEGATIVE
Ketones, ur: 15 mg/dL — AB
Nitrite: NEGATIVE
Protein, ur: 30 mg/dL — AB
Specific Gravity, Urine: 1.039 — ABNORMAL HIGH (ref 1.005–1.030)

## 2013-07-11 LAB — DIFFERENTIAL
Basophils Absolute: 0 10*3/uL (ref 0.0–0.1)
Basophils Relative: 0 % (ref 0–1)
Eosinophils Absolute: 0.1 10*3/uL (ref 0.0–0.7)
Eosinophils Relative: 1 % (ref 0–5)
Lymphs Abs: 3.6 10*3/uL (ref 0.7–4.0)
Monocytes Relative: 7 % (ref 3–12)
Neutrophils Relative %: 51 % (ref 43–77)

## 2013-07-11 LAB — POCT I-STAT, CHEM 8
BUN: 15 mg/dL (ref 6–23)
Calcium, Ion: 1.21 mmol/L (ref 1.12–1.23)
Chloride: 102 mEq/L (ref 96–112)
Glucose, Bld: 183 mg/dL — ABNORMAL HIGH (ref 70–99)
Sodium: 142 mEq/L (ref 135–145)

## 2013-07-11 LAB — RAPID URINE DRUG SCREEN, HOSP PERFORMED
Amphetamines: POSITIVE — AB
Benzodiazepines: NOT DETECTED
Opiates: NOT DETECTED

## 2013-07-11 LAB — CBC
Hemoglobin: 14.8 g/dL (ref 13.0–17.0)
MCH: 29.9 pg (ref 26.0–34.0)
MCHC: 34.4 g/dL (ref 30.0–36.0)
RDW: 13 % (ref 11.5–15.5)

## 2013-07-11 LAB — COMPREHENSIVE METABOLIC PANEL
Albumin: 3.9 g/dL (ref 3.5–5.2)
Alkaline Phosphatase: 63 U/L (ref 39–117)
BUN: 15 mg/dL (ref 6–23)
Calcium: 9.6 mg/dL (ref 8.4–10.5)
Chloride: 100 mEq/L (ref 96–112)
Creatinine, Ser: 0.83 mg/dL (ref 0.50–1.35)
GFR calc Af Amer: >90 mL/min (ref >90)
Glucose, Bld: 181 mg/dL — ABNORMAL HIGH (ref 70–99)
Potassium: 3.4 mEq/L — ABNORMAL LOW (ref 3.5–5.1)
Total Protein: 7.7 g/dL (ref 6.0–8.3)

## 2013-07-11 LAB — ETHANOL: Alcohol, Ethyl (B): <11 mg/dL (ref 0–11)

## 2013-07-11 LAB — TROPONIN I: Troponin I: <0.3 ng/mL (ref <0.30)

## 2013-07-11 LAB — APTT: aPTT: 30 seconds (ref 24–37)

## 2013-07-11 LAB — PROTIME-INR
INR: 1.03 (ref 0.00–1.49)
Prothrombin Time: 13.3 seconds (ref 11.6–15.2)

## 2013-07-11 MED ORDER — ENOXAPARIN SODIUM 150 MG/ML ~~LOC~~ SOLN: 150.0000 mg | Freq: Two times a day (BID) | SUBCUTANEOUS | Status: DC

## 2013-07-11 MED ORDER — LORAZEPAM 1 MG PO TABS
1.0000 mg | ORAL_TABLET | Freq: Once | ORAL | Status: AC
  Administered 2013-07-11: 1 mg via ORAL
  Filled 2013-07-11: qty 1

## 2013-07-11 NOTE — Code Documentation (Addendum)
42 year old male presents to [facility] via [facility] as Code Stroke.  Patient reports he was at [employer] today and around 1620 felt dizzy and "weird".  Got on his bike and rode to the [facility] [employer] where he currently resides.  He spoke with another resident and asked for the nurse.  He was noted to have slurred speech and an unsteady gait.  On arrival he was alert - able to follow commands - no focal weakness - oriented.  He had some stuttering speech which was very inconsistent. He would have fluent speech then revert to stuttering.  NIHSS 0. CT scan done.  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  BP normal.  CBG 183.  Code stroke cancelled per Dr. Roseanne Reno.   Handoff to [place], Charity fundraiser.

## 2013-07-11 NOTE — ED Notes (Signed)
Pt in with stroke like symptoms, per EMS reported last see normal was 1630, pt was at [employer] and was noted to have sudden onset of stuttering with aphasia per friends, also abnormal gait, pt was seen at 1630 without symptoms, upon arrival to ED symptoms of stuttering continued, taken to CT upon arrival with RN, IV established PTA, CBG 217 en route, pt alert and oriented

## 2013-07-11 NOTE — ED Notes (Signed)
Code stroke cancelled per Dr. Roseanne Reno

## 2013-07-11 NOTE — ED Notes (Signed)
MD at bedside. 

## 2013-07-11 NOTE — ED Provider Notes (Signed)
CSN: [id number]     Arrival date & time 07/11/13  1757 History   First MD Initiated Contact with Patient 07/11/13 1816     Chief Complaint  Patient presents with  . Code Stroke   (Consider location/radiation/quality/duration/timing/severity/associated sxs/prior Treatment) HPI Comments: Pt presents with stuttering, aphasia, abnl gait at [facility].  LSN 16:30.      Patient is a 42 y.o. male presenting with neurologic complaint. The history is provided by the patient. No language interpreter was used.  Neurologic Problem The current episode started 1 to 2 hours ago. The problem occurs constantly. The problem has not changed since onset.Pertinent negatives include no chest pain, no abdominal pain, no headaches and no shortness of breath. Nothing aggravates the symptoms. Nothing relieves the symptoms. He has tried nothing for the symptoms. The treatment provided no relief.    Past Medical History  Diagnosis Date  . Asthma   . Depression   . Diabetes   . Headache(784.0)     frequent  . Migraines   . History of blood clots   . Hyperlipidemia   . Obesity   . Sleep apnea   . DVT (deep venous thrombosis), hx of recurrent 05/23/2012  . History of blood clot in brain, 2012 - followed by [facility] 05/23/2012  . Seizure disorder - followed by [facility] 05/23/2012  . Hyperlipemia 08/25/2012  . Anxiety   . Tuberculosis   . Pneumonia   . Kidney disease   . Kidney stones   . Seizure    Past Surgical History  Procedure Laterality Date  . Brain surgery    . Filter for blood clots     Family History  Problem Relation Age of Onset  . Hyperlipidemia      parent  . Heart disease      parent  . Stroke      parent  . Diabetes      grandparent /parent  . Obesity Other   . Heart attack Other    History  Substance Use Topics  . Smoking status: Former Games developer  . Smokeless tobacco: Not on file  . Alcohol Use: No    Review of Systems  Constitutional: Negative for  fever, activity change, appetite change and fatigue.  HENT: Negative for congestion, facial swelling, rhinorrhea and trouble swallowing.   Eyes: Negative for photophobia and pain.  Respiratory: Negative for cough, chest tightness and shortness of breath.   Cardiovascular: Negative for chest pain and leg swelling.  Gastrointestinal: Negative for nausea, vomiting, abdominal pain, diarrhea and constipation.  Endocrine: Negative for polydipsia and polyuria.  Genitourinary: Negative for dysuria, urgency, decreased urine volume and difficulty urinating.  Musculoskeletal: Negative for back pain and gait problem.  Skin: Negative for color change, rash and wound.  Allergic/Immunologic: Negative for immunocompromised state.  Neurological: Positive for speech difficulty. Negative for dizziness, facial asymmetry, weakness, numbness and headaches.  Psychiatric/Behavioral: Negative for confusion, decreased concentration and agitation.    Allergies  Penicillins  Home Medications   Current Outpatient Rx  Name  Route  Sig  Dispense  Refill  . aspirin-acetaminophen-caffeine (EXCEDRIN MIGRAINE) 250-250-65 MG per tablet   Oral   Take 1 tablet by mouth every 6 (six) hours as needed for pain. Headaches   30 tablet   0   . gabapentin (NEURONTIN) 300 MG capsule   Oral   Take 1 capsule (300 mg total) by mouth 3 (three) times daily.   90 capsule   0   . hydrOXYzine (  ATARAX/VISTARIL) 25 MG tablet   Oral   Take 50 mg by mouth at bedtime.         . insulin glargine (LANTUS) 100 UNIT/ML injection   Subcutaneous   Inject 0.4 mLs (40 Units total) into the skin at bedtime. For diabetes management   10 mL   12   . levETIRAcetam (KEPPRA) 500 MG tablet   Oral   Take 1 tablet (500 mg total) by mouth 2 (two) times daily.   60 tablet   3   . metFORMIN (GLUCOPHAGE) 1000 MG tablet   Oral   Take 1 tablet (1,000 mg total) by mouth 2 (two) times daily with a meal. For diabetes management   60 tablet    3   . simvastatin (ZOCOR) 80 MG tablet   Oral   Take 80 mg by mouth daily.         . traZODone (DESYREL) 25 mg TABS tablet   Oral   Take 0.5 tablets (25 mg total) by mouth at bedtime as needed for sleep.   30 tablet   3   . Vilazodone HCl (VIIBRYD) 40 MG TABS   Oral   Take 1 tablet (40 mg total) by mouth daily.   30 tablet   3   . enoxaparin (LOVENOX) 150 MG/ML injection   Subcutaneous   Inject 1 mL (150 mg total) into the skin every 12 (twelve) hours. For blood clots   60 Syringe   3    BP 106/66  Pulse 61  Temp(Src) 98.2 F (36.8 C) (Oral)  Resp 14  SpO2 96% Physical Exam  Constitutional: He is oriented to person, place, and time. He appears well-developed and well-nourished. No distress.  HENT:  Head: Normocephalic and atraumatic.  Mouth/Throat: No oropharyngeal exudate.  Eyes: Pupils are equal, round, and reactive to light.  Neck: Normal range of motion. Neck supple.  Cardiovascular: Normal rate, regular rhythm and normal heart sounds.  Exam reveals no gallop and no friction rub.   No murmur heard. Pulmonary/Chest: Effort normal and breath sounds normal. No respiratory distress. He has no wheezes. He has no rales.  Abdominal: Soft. Bowel sounds are normal. He exhibits no distension and no mass. There is no tenderness. There is no rebound and no guarding.  Musculoskeletal: Normal range of motion. He exhibits no edema and no tenderness.  Neurological: He is alert and oriented to person, place, and time.  Stuttering speech  Skin: Skin is warm and dry.  Psychiatric: He has a normal mood and affect.    ED Course  Procedures (including critical care time) Labs Review Labs Reviewed  COMPREHENSIVE METABOLIC PANEL - Abnormal; Notable for the following:    Potassium 3.4 (*)    Glucose, Bld 181 (*)    All other components within normal limits  URINE RAPID DRUG SCREEN (HOSP PERFORMED) - Abnormal; Notable for the following:    Amphetamines POSITIVE (*)    All other  components within normal limits  URINALYSIS, ROUTINE W REFLEX MICROSCOPIC - Abnormal; Notable for the following:    Specific Gravity, Urine 1.039 (*)    Glucose, UA >1000 (*)    Bilirubin Urine SMALL (*)    Ketones, ur 15 (*)    Protein, ur 30 (*)    All other components within normal limits  GLUCOSE, CAPILLARY - Abnormal; Notable for the following:    Glucose-Capillary 188 (*)    All other components within normal limits  URINE MICROSCOPIC-ADD ON - Abnormal; Notable for  the following:    Casts HYALINE CASTS (*)    All other components within normal limits  POCT I-STAT, CHEM 8 - Abnormal; Notable for the following:    Potassium 3.4 (*)    Glucose, Bld 183 (*)    All other components within normal limits  ETHANOL  PROTIME-INR  APTT  CBC  DIFFERENTIAL  TROPONIN I  POCT I-STAT TROPONIN I   Imaging Review Ct Head Wo Contrast  07/11/2013   CLINICAL DATA:  Code stroke.  EXAM: CT HEAD WITHOUT CONTRAST  TECHNIQUE: Contiguous axial images were obtained from the base of the skull through the vertex without intravenous contrast.  COMPARISON:  Head CT 04/07/2013 and MRI brain same date.  FINDINGS: Stable surgical changes from left-sided temporal parietal craniotomy. Remote remote infarct or encephalomalacia in left posterior parietal region. No extra-axial fluid collections are identified. No CT findings for acute hemispheric infarction or intracranial hemorrhage. No mass lesions. The brainstem and cerebellum are grossly normal.  The paranasal sinuses and mastoid air cells are clear.  IMPRESSION: Remote postsurgical changes.  No acute intracranial findings.   Electronically Signed   By: Loralie Champagne M.D.   On: 07/11/2013 18:17    EKG Interpretation   None       MDM   1. Speech abnormality   2. DM (diabetes mellitus), type 2, uncontrolled   3. Hyperlipemia   4. Seizure disorder    Pt is a 42 y.o. male with Pmhx as above who presents with stuttering, aphasia, abnl gait at [facility].  LSN 16:30.  Pt seen [place] [facility] as code CVA activation, taken to CT upon arrival.  CT head unremarkable.  Neurology does not feel symptoms c/w CVA.  He had similar presentation 9/6/'14 and had negative CT/MRI. On PE, VSS, pt in NAD.  No focal neuro findings on exam.  Pt displayed stuttering speech which resolved w/ administration of 1mg  ativan.  Pt able to walk in dept w/o difficulty. Pt safe for d/c, have refilled his Lovenox Rx, he will f/u with [facility] tomorrow.            Shanna Cisco, MD 07/12/13 [phone]

## 2013-07-11 NOTE — Consult Note (Signed)
Reason for Consult: New-onset speech change and gait abnormality.  HPI:                                                                                                                                          Bryan Mcmillan is an 42 y.o. male Mr. diabetes mellitus, migraine headaches, hyperlipidemia, DVT, seizure disorder and anxiety who was brought to the emergency room by EMS in code stroke status because of changes in his speech and his gait which apparently occurred about 4:30 PM today. Speech changes described as stuttering. He said previous presentations with deficits that were nonphysiologic. CT scan of his head today showed no acute intracranial abnormality. Speech pattern was deemed to be nonphysiologic. No focal deficits were noted on examination. NIH stroke score was 0. Code stroke was canceled.  Past Medical History  Diagnosis Date  . Asthma   . Depression   . Diabetes   . Headache(784.0)     frequent  . Migraines   . History of blood clots   . Hyperlipidemia   . Obesity   . Sleep apnea   . DVT (deep venous thrombosis), hx of recurrent 05/23/2012  . History of blood clot in brain, 2012 - followed by [facility] 05/23/2012  . Seizure disorder - followed by [facility] 05/23/2012  . Hyperlipemia 08/25/2012  . Anxiety   . Tuberculosis   . Pneumonia   . Kidney disease   . Kidney stones   . Seizure     Past Surgical History  Procedure Laterality Date  . Brain surgery    . Filter for blood clots      Family History  Problem Relation Age of Onset  . Hyperlipidemia      parent  . Heart disease      parent  . Stroke      parent  . Diabetes      grandparent /parent  . Obesity Other   . Heart attack Other     Social History:  reports that he has quit smoking. He does not have any smokeless tobacco history on file. He reports that he does not drink alcohol or use illicit drugs.  Allergies  Allergen Reactions  . Penicillins Other (See Comments)   "childhood reaction"    MEDICATIONS:  I have reviewed the patient's current medications.   ROS:                                                                                                                                       History obtained from the patient  General ROS: negative for - chills, fatigue, fever, night sweats, weight gain or weight loss Psychological ROS: negative for - behavioral disorder, hallucinations, memory difficulties, mood swings or suicidal ideation Ophthalmic ROS: negative for - blurry vision, double vision, eye pain or loss of vision ENT ROS: negative for - epistaxis, nasal discharge, oral lesions, sore throat, tinnitus or vertigo Allergy and Immunology ROS: negative for - hives or itchy/watery eyes Hematological and Lymphatic ROS: negative for - bleeding problems, bruising or swollen lymph nodes Endocrine ROS: negative for - galactorrhea, hair pattern changes, polydipsia/polyuria or temperature intolerance Respiratory ROS: negative for - cough, hemoptysis, shortness of breath or wheezing Cardiovascular ROS: negative for - chest pain, dyspnea on exertion, edema or irregular heartbeat Gastrointestinal ROS: negative for - abdominal pain, diarrhea, hematemesis, nausea/vomiting or stool incontinence Genito-Urinary ROS: negative for - dysuria, hematuria, incontinence or urinary frequency/urgency Musculoskeletal ROS: negative for - joint swelling or muscular weakness Neurological ROS: as noted in HPI Dermatological ROS: negative for rash and skin lesion changes   Blood pressure 125/76, pulse 79, temperature 98 F (36.7 C), temperature source Oral, resp. rate 18, SpO2 98.00%.   Neurologic Examination:                                                                                                      Mental Status: Alert, oriented, thought content  appropriate.  Speech pattern consisted of nonphysiologic stuttering without evidence of aphasia. Able to follow commands without difficulty. Cranial Nerves: II-Visual fields were normal. III/IV/VI-Pupils were equal and reacted. Extraocular movements were full and conjugate.    V/VII-no facial numbness and no facial weakness. VIII-normal. X-no dysarthria noted. Motor: 5/5 bilaterally with normal tone and bulk Sensory: Normal throughout. Deep Tendon Reflexes: 2+ and symmetric. Plantars: Mute bilaterally Cerebellar: Normal finger-to-nose testing.  Lab Results  Component Value Date/Time   CHOL 167 11/24/2012  9:21 AM    Results for orders placed during the hospital encounter of 07/11/13 (from the past 48 hour(s))  CBC     Status: None   Collection Time    07/11/13  6:08 PM      Result Value Range   WBC 8.7  4.0 - 10.5 K/uL   RBC  4.95  4.22 - 5.81 MIL/uL   Hemoglobin 14.8  13.0 - 17.0 g/dL   HCT 16.1  09.6 - 04.5 %   MCV 86.9  78.0 - 100.0 fL   MCH 29.9  26.0 - 34.0 pg   MCHC 34.4  30.0 - 36.0 g/dL   RDW 40.9  81.1 - 91.4 %   Platelets 324  150 - 400 K/uL  DIFFERENTIAL     Status: None   Collection Time    07/11/13  6:08 PM      Result Value Range   Neutrophils Relative % 51  43 - 77 %   Neutro Abs 4.4  1.7 - 7.7 K/uL   Lymphocytes Relative 41  12 - 46 %   Lymphs Abs 3.6  0.7 - 4.0 K/uL   Monocytes Relative 7  3 - 12 %   Monocytes Absolute 0.6  0.1 - 1.0 K/uL   Eosinophils Relative 1  0 - 5 %   Eosinophils Absolute 0.1  0.0 - 0.7 K/uL   Basophils Relative 0  0 - 1 %   Basophils Absolute 0.0  0.0 - 0.1 K/uL  GLUCOSE, CAPILLARY     Status: Abnormal   Collection Time    07/11/13  6:16 PM      Result Value Range   Glucose-Capillary 188 (*) 70 - 99 mg/dL  POCT I-STAT TROPONIN I     Status: None   Collection Time    07/11/13  6:24 PM      Result Value Range   Troponin i, poc 0.00  0.00 - 0.08 ng/mL   Comment 3            Comment: Due to the release kinetics of cTnI,      a negative result within the first hours     of the onset of symptoms does not rule out     myocardial infarction with certainty.     If myocardial infarction is still suspected,     repeat the test at appropriate intervals.  POCT I-STAT, CHEM 8     Status: Abnormal   Collection Time    07/11/13  6:25 PM      Result Value Range   Sodium 142  135 - 145 mEq/L   Potassium 3.4 (*) 3.5 - 5.1 mEq/L   Chloride 102  96 - 112 mEq/L   BUN 15  6 - 23 mg/dL   Creatinine, Ser 7.82  0.50 - 1.35 mg/dL   Glucose, Bld 956 (*) 70 - 99 mg/dL   Calcium, Ion 2.13  0.86 - 1.23 mmol/L   TCO2 26  0 - 100 mmol/L   Hemoglobin 15.3  13.0 - 17.0 g/dL   HCT 57.8  46.9 - 62.9 %    Ct Head Wo Contrast  07/11/2013   CLINICAL DATA:  Code stroke.  EXAM: CT HEAD WITHOUT CONTRAST  TECHNIQUE: Contiguous axial images were obtained from the base of the skull through the vertex without intravenous contrast.  COMPARISON:  Head CT 04/07/2013 and MRI brain same date.  FINDINGS: Stable surgical changes from left-sided temporal parietal craniotomy. Remote remote infarct or encephalomalacia in left posterior parietal region. No extra-axial fluid collections are identified. No CT findings for acute hemispheric infarction or intracranial hemorrhage. No mass lesions. The brainstem and cerebellum are grossly normal.  The paranasal sinuses and mastoid air cells are clear.  IMPRESSION: Remote postsurgical changes.  No acute intracranial findings.   Electronically Signed   By: Loraine Leriche  Gallerani M.D.   On: 07/11/2013 18:17    Assessment/Plan: No objective signs of an acute focal neurological deficit. Patient's apparent speech abnormality is nonphysiologic. He has no signs of focal weakness and no coordination abnormality. Patient's presentation is also not indicative of recurrent seizure activity.  No further neurological intervention is indicated at this point. Recommend followup on a routine basis with neurologist regarding routine seizure  disorder management.  C.R. Roseanne Reno, MD [facility] [phone]  07/11/2013, 6:39 PM

## 2013-07-31 ENCOUNTER — Other Ambulatory Visit: Payer: Self-pay | Admitting: Internal Medicine

## 2013-07-31 MED ORDER — VILAZODONE HCL 40 MG PO TABS: 40.0000 mg | ORAL_TABLET | Freq: Every day | ORAL | Status: DC

## 2013-07-31 MED ORDER — ENOXAPARIN SODIUM 150 MG/ML ~~LOC~~ SOLN: 150.0000 mg | Freq: Two times a day (BID) | SUBCUTANEOUS | Status: DC

## 2013-08-07 ENCOUNTER — Ambulatory Visit: Payer: Self-pay | Admitting: Internal Medicine

## 2013-08-07 ENCOUNTER — Ambulatory Visit: Payer: No Typology Code available for payment source | Attending: Internal Medicine | Admitting: Internal Medicine

## 2013-08-07 ENCOUNTER — Encounter: Payer: Self-pay | Admitting: Internal Medicine

## 2013-08-07 VITALS — BP 123/78 | HR 95 | Temp 97.7°F | Resp 17 | Wt 288.0 lb

## 2013-08-07 DIAGNOSIS — R197 Diarrhea, unspecified: Secondary | ICD-10-CM | POA: Insufficient documentation

## 2013-08-07 DIAGNOSIS — R110 Nausea: Secondary | ICD-10-CM

## 2013-08-07 MED ORDER — DIPHENOXYLATE-ATROPINE 2.5-0.025 MG PO TABS: 1.0000 | ORAL_TABLET | Freq: Four times a day (QID) | ORAL | Status: DC | PRN

## 2013-08-07 MED ORDER — TRAZODONE 25 MG HALF TABLET: 25.0000 mg | ORAL_TABLET | Freq: Every evening | ORAL | Status: DC | PRN

## 2013-08-07 MED ORDER — SIMVASTATIN 80 MG PO TABS: 80.0000 mg | ORAL_TABLET | Freq: Every day | ORAL | Status: DC

## 2013-08-07 MED ORDER — ENOXAPARIN SODIUM 150 MG/ML ~~LOC~~ SOLN: 150.0000 mg | Freq: Two times a day (BID) | SUBCUTANEOUS | Status: DC

## 2013-08-07 MED ORDER — VILAZODONE HCL 40 MG PO TABS: 40.0000 mg | ORAL_TABLET | Freq: Every day | ORAL | Status: DC

## 2013-08-07 MED ORDER — GABAPENTIN 300 MG PO CAPS: 300.0000 mg | ORAL_CAPSULE | Freq: Three times a day (TID) | ORAL | Status: DC

## 2013-08-07 MED ORDER — MECLIZINE HCL 25 MG PO TABS: 25.0000 mg | ORAL_TABLET | Freq: Three times a day (TID) | ORAL | Status: DC | PRN

## 2013-08-07 NOTE — Patient Instructions (Signed)
Diet for Diarrhea, Adult  Frequent, runny stools (diarrhea) may be caused or worsened by food or drink. Diarrhea may be relieved by changing your diet. Since diarrhea can last up to 7 days, it is easy for you to lose too much fluid from the body and become dehydrated. Fluids that are lost need to be replaced. Along with a modified diet, make sure you drink enough fluids to keep your urine clear or pale yellow.  DIET INSTRUCTIONS  · Ensure adequate fluid intake (hydration): have 1 cup (8 oz) of fluid for each diarrhea episode. Avoid fluids that contain simple sugars or sports drinks, fruit juices, whole milk products, and sodas. Your urine should be clear or pale yellow if you are drinking enough fluids. Hydrate with an oral rehydration solution that you can purchase at pharmacies, retail stores, and online. You can prepare an oral rehydration solution at home by mixing the following ingredients together:  ·   tsp table salt.  · ¾ tsp baking soda.  ·  tsp salt substitute containing potassium chloride.  · 1  tablespoons sugar.  · 1 L (34 oz) of water.  · Certain foods and beverages may increase the speed at which food moves through the gastrointestinal (GI) tract. These foods and beverages should be avoided and include:  · Caffeinated and alcoholic beverages.  · High-fiber foods, such as raw fruits and vegetables, nuts, seeds, and whole grain breads and cereals.  · Foods and beverages sweetened with sugar alcohols, such as xylitol, sorbitol, and mannitol.  · Some foods may be well tolerated and may help thicken stool including:  · Starchy foods, such as rice, toast, pasta, low-sugar cereal, oatmeal, grits, baked potatoes, crackers, and bagels.    · Bananas.    · Applesauce.  · Add probiotic-rich foods to help increase healthy bacteria in the GI tract, such as yogurt and fermented milk products.  RECOMMENDED FOODS AND BEVERAGES  Starches  Choose foods with less than 2 g of fiber per serving.  · Recommended:  White,  French, and pita breads, plain rolls, buns, bagels. Plain muffins, matzo. Soda, saltine, or graham crackers. Pretzels, melba toast, zwieback. Cooked cereals made with water: cornmeal, farina, cream cereals. Dry cereals: refined corn, wheat, rice. Potatoes prepared any way without skins, refined macaroni, spaghetti, noodles, refined rice.  · Avoid:  Bread, rolls, or crackers made with whole wheat, multi-grains, rye, bran seeds, nuts, or coconut. Corn tortillas or taco shells. Cereals containing whole grains, multi-grains, bran, coconut, nuts, raisins. Cooked or dry oatmeal. Coarse wheat cereals, granola. Cereals advertised as "high-fiber." Potato skins. Whole grain pasta, wild or brown rice. Popcorn. Sweet potatoes, yams. Sweet rolls, doughnuts, waffles, pancakes, sweet breads.  Vegetables  · Recommended: Strained tomato and vegetable juices. Most well-cooked and canned vegetables without seeds. Fresh: Tender lettuce, cucumber without the skin, cabbage, spinach, bean sprouts.  · Avoid: Fresh, cooked, or canned: Artichokes, baked beans, beet greens, broccoli, Brussels sprouts, corn, kale, legumes, peas, sweet potatoes. Cooked: Green or red cabbage, spinach. Avoid large servings of any vegetables because vegetables shrink when cooked, and they contain more fiber per serving than fresh vegetables.  Fruit  · Recommended: Cooked or canned: Apricots, applesauce, cantaloupe, cherries, fruit cocktail, grapefruit, grapes, kiwi, mandarin oranges, peaches, pears, plums, watermelon. Fresh: Apples without skin, ripe banana, grapes, cantaloupe, cherries, grapefruit, peaches, oranges, plums. Keep servings limited to ½ cup or 1 piece.  · Avoid: Fresh: Apples with skin, apricots, mangoes, pears, raspberries, strawberries. Prune juice, stewed or dried prunes. Dried   fruits, raisins, dates. Large servings of all fresh fruits.  Protein  · Recommended: Ground or well-cooked tender beef, ham, veal, lamb, pork, or poultry. Eggs. Fish,  oysters, shrimp, lobster, other seafoods. Liver, organ meats.  · Avoid: Tough, fibrous meats with gristle. Peanut butter, smooth or chunky. Cheese, nuts, seeds, legumes, dried peas, beans, lentils.  Dairy  · Recommended: Yogurt, lactose-free milk, kefir, drinkable yogurt, buttermilk, soy milk, or plain hard cheese.  · Avoid: Milk, chocolate milk, beverages made with milk, such as milkshakes.  Soups  · Recommended: Bouillon, broth, or soups made from allowed foods. Any strained soup.  · Avoid: Soups made from vegetables that are not allowed, cream or milk-based soups.  Desserts and Sweets  · Recommended: Sugar-free gelatin, sugar-free frozen ice pops made without sugar alcohol.  · Avoid: Plain cakes and cookies, pie made with fruit, pudding, custard, cream pie. Gelatin, fruit, ice, sherbet, frozen ice pops. Ice cream, ice milk without nuts. Plain hard candy, honey, jelly, molasses, syrup, sugar, chocolate syrup, gumdrops, marshmallows.  Fats and Oils  · Recommended: Limit fats to less than 8 tsp per day.  · Avoid: Seeds, nuts, olives, avocados. Margarine, butter, cream, mayonnaise, salad oils, plain salad dressings. Plain gravy, crisp bacon without rind.  Beverages  · Recommended: Water, decaffeinated teas, oral rehydration solutions, sugar-free beverages not sweetened with sugar alcohols.  · Avoid: Fruit juices, caffeinated beverages (coffee, tea, soda), alcohol, sports drinks, or lemon-lime soda.  Condiments  · Recommended: Ketchup, mustard, horseradish, vinegar, cocoa powder. Spices in moderation: allspice, basil, bay leaves, celery powder or leaves, cinnamon, cumin powder, curry powder, ginger, mace, marjoram, onion or garlic powder, oregano, paprika, parsley flakes, ground pepper, rosemary, sage, savory, tarragon, thyme, turmeric.  · Avoid: Coconut, honey.  Document Released: 10/09/2003 Document Revised: 04/12/2012 Document Reviewed: 12/03/2011  [employer]® Patient Information ©2014 [employer].

## 2013-08-07 NOTE — Progress Notes (Signed)
MRN: [id number] Name: Bryan Mcmillan  Sex: male Age: 43 y.o. DOB: 23-Jul-1971  Allergies: Penicillins  Chief Complaint  Patient presents with  . Diarrhea    HPI: Patient is 43 y.o. male who comes today reported to have diarrhea for the last 2 days, also had nausea denies any vomiting, his last bowel movement was today and he has had 3 bowel movements denies any bleeding, patient has been able to eat, denies any fever chills any urinary symptoms. Patient is also requesting refill on his medications. Past Medical History  Diagnosis Date  . Asthma   . Depression   . Diabetes   . Headache(784.0)     frequent  . Migraines   . History of blood clots   . Hyperlipidemia   . Obesity   . Sleep apnea   . DVT (deep venous thrombosis), hx of recurrent 05/23/2012  . History of blood clot in brain, 2012 - followed by [facility] 05/23/2012  . Seizure disorder - followed by [facility] 05/23/2012  . Hyperlipemia 08/25/2012  . Anxiety   . Tuberculosis   . Pneumonia   . Kidney disease   . Kidney stones   . Seizure     Past Surgical History  Procedure Laterality Date  . Brain surgery    . Filter for blood clots        Medication List       This list is accurate as of: 08/07/13 10:08 AM.  Always use your most recent med list.               aspirin-acetaminophen-caffeine 250-250-65 MG per tablet  Commonly known as:  EXCEDRIN MIGRAINE  Take 1 tablet by mouth every 6 (six) hours as needed for pain. Headaches     diphenoxylate-atropine 2.5-0.025 MG per tablet  Commonly known as:  LOMOTIL  Take 1 tablet by mouth 4 (four) times daily as needed for diarrhea or loose stools.     enoxaparin 150 MG/ML injection  Commonly known as:  LOVENOX  Inject 1 mL (150 mg total) into the skin every 12 (twelve) hours. For blood clots     gabapentin 300 MG capsule  Commonly known as:  NEURONTIN  Take 1 capsule (300 mg total) by mouth 3 (three) times daily.     hydrOXYzine 25 MG tablet   Commonly known as:  ATARAX/VISTARIL  Take 50 mg by mouth at bedtime.     insulin glargine 100 UNIT/ML injection  Commonly known as:  LANTUS  Inject 0.4 mLs (40 Units total) into the skin at bedtime. For diabetes management     levETIRAcetam 500 MG tablet  Commonly known as:  KEPPRA  Take 1 tablet (500 mg total) by mouth 2 (two) times daily.     meclizine 25 MG tablet  Commonly known as:  ANTIVERT  Take 1 tablet (25 mg total) by mouth 3 (three) times daily as needed for nausea.     metFORMIN 1000 MG tablet  Commonly known as:  GLUCOPHAGE  Take 1 tablet (1,000 mg total) by mouth 2 (two) times daily with a meal. For diabetes management     simvastatin 80 MG tablet  Commonly known as:  ZOCOR  Take 80 mg by mouth daily.     traZODone 25 mg Tabs tablet  Commonly known as:  DESYREL  Take 0.5 tablets (25 mg total) by mouth at bedtime as needed for sleep.     Vilazodone HCl 40 MG Tabs  Commonly known as:  VIIBRYD  Take 1 tablet (40 mg total) by mouth daily.        Meds ordered this encounter  Medications  . diphenoxylate-atropine (LOMOTIL) 2.5-0.025 MG per tablet    Sig: Take 1 tablet by mouth 4 (four) times daily as needed for diarrhea or loose stools.    Dispense:  30 tablet    Refill:  0  . meclizine (ANTIVERT) 25 MG tablet    Sig: Take 1 tablet (25 mg total) by mouth 3 (three) times daily as needed for nausea.    Dispense:  30 tablet    Refill:  1    Immunization History  Administered Date(s) Administered  . Influenza Whole 05/02/2012    Family History  Problem Relation Age of Onset  . Hyperlipidemia      parent  . Heart disease      parent  . Stroke      parent  . Diabetes      grandparent /parent  . Obesity Other   . Heart attack Other     History  Substance Use Topics  . Smoking status: Former Research scientist (life sciences)  . Smokeless tobacco: Not on file  . Alcohol Use: No    Review of Systems  As noted in HPI  Filed Vitals:   08/07/13 0953  BP: 123/78   Pulse: 95  Temp: 97.7 F (36.5 C)  Resp: 17    Physical Exam  Physical Exam  Constitutional: No distress.  Cardiovascular: Normal rate and regular rhythm.   Pulmonary/Chest: Breath sounds normal. No respiratory distress. He has no wheezes. He has no rales.  Abdominal: Soft. There is no tenderness. There is no rebound.    CBC    Component Value Date/Time   WBC 8.7 07/11/2013 1808   RBC 4.95 07/11/2013 1808   HGB 15.3 07/11/2013 1825   HCT 45.0 07/11/2013 1825   PLT 324 07/11/2013 1808   MCV 86.9 07/11/2013 1808   LYMPHSABS 3.6 07/11/2013 1808   MONOABS 0.6 07/11/2013 1808   EOSABS 0.1 07/11/2013 1808   BASOSABS 0.0 07/11/2013 1808    CMP     Component Value Date/Time   NA 142 07/11/2013 1825   K 3.4* 07/11/2013 1825   CL 102 07/11/2013 1825   CO2 24 07/11/2013 1808   GLUCOSE 183* 07/11/2013 1825   BUN 15 07/11/2013 1825   CREATININE 1.10 07/11/2013 1825   CALCIUM 9.6 07/11/2013 1808   PROT 7.7 07/11/2013 1808   ALBUMIN 3.9 07/11/2013 1808   AST 20 07/11/2013 1808   ALT 33 07/11/2013 1808   ALKPHOS 63 07/11/2013 1808   BILITOT 0.3 07/11/2013 1808   GFRNONAA >90 07/11/2013 1808   GFRAA >90 07/11/2013 1808    Lab Results  Component Value Date/Time   CHOL 167 11/24/2012  9:21 AM    No components found with this basename: hga1c    Lab Results  Component Value Date/Time   AST 20 07/11/2013  6:08 PM    Assessment and Plan  Nausea alone - Plan: meclizine (ANTIVERT) 25 MG tablet  Diarrhea - Plan: Stool culture, Clostridium difficile EIA, Fecal leukocytes, Giardia, EIA; Ova/Parasite, diphenoxylate-atropine (LOMOTIL) 2.5-0.025 MG per tablet  Advised patient for BRAT diet   Lorayne Marek, MD

## 2013-08-07 NOTE — Progress Notes (Signed)
Patient here for diarrhea and urge to vomit past couple of days Feels fatigued and dehydrated

## 2013-08-09 ENCOUNTER — Ambulatory Visit: Payer: No Typology Code available for payment source | Attending: Internal Medicine | Admitting: Family Medicine

## 2013-08-09 VITALS — BP 119/85 | HR 74 | Temp 98.0°F | Resp 15

## 2013-08-09 DIAGNOSIS — E1165 Type 2 diabetes mellitus with hyperglycemia: Principal | ICD-10-CM

## 2013-08-09 DIAGNOSIS — IMO0001 Reserved for inherently not codable concepts without codable children: Secondary | ICD-10-CM | POA: Insufficient documentation

## 2013-08-09 NOTE — Patient Instructions (Signed)

## 2013-08-09 NOTE — Progress Notes (Signed)
Patient here to have his insulin adjusted Is on  novolog R  Sliding scale States his blood sugars have been all over the place Some days his sugar bottoms out

## 2013-08-09 NOTE — Progress Notes (Signed)
   Subjective:    Patient ID: Bryan Mcmillan, male    DOB: Aug 25, 1970, 43 y.o.   MRN: [id number]  HPI This patient is here today for diabetes followup. He brings with him a record of his sugars from all times of the day. This record is on his phone. He has had some lows down into the 50s 60s and 70s however the majority of his sugars have been up in the 200s 300s 400s and 500s. He says he is prescribed 40 units of Lantus and sliding scale of Novolin but he gives himself anywhere from 40-120 units of Lantus and uses this in various combinations with his sliding scale. He feels that carbohydrate counting is useless in this not help. He feels that his mealtime insulin races he sugars. He feels that the Lantus he takes at bedtime does not impact his morning sugar because it is so long acting. He currently is living at the [employer] and eats breakfast and dinner Advair. He says breakfast is usually [employer] and dinner is typically rice or pasta. He denies polyuria or polydipsia at this time and also denies blurry vision   Review of Systems A 12 point review of systems is negative except as per hpi.       Objective:   Physical Exam  Nursing note and vitals reviewed. Constitutional: He is oriented to person, place, and time. He  appears well-developed and well-nourished.  HENT: MMMP Cardiovascular: Normal rate, regular rhythm and normal heart sounds.   Pulmonary/Chest: Effort normal and breath sounds normal.  Abdominal: Soft. Bowel sounds are normal.  no distension. There is no tenderness. There is no rebound. obese Lymphadenopathy:    He has no cervical adenopathy.  Neurological: He is alert and oriented to person, place, and time. He has normal reflexes.  Skin: Skin is warm and dry.He has no concerning moles or skin lesions Psychiatric: He has a normal mood and slightly flat affect. His behavior is normal.       Assessment & Plan:  Bryan Mcmillan was seen today for medication  management.  Diagnoses and associated orders for this visit:  Type II or unspecified type diabetes mellitus without mention of complication, uncontrolled - Ambulatory referral to diabetic education I am concerned about his combinations of very high dose Lantus with Novolin. He has not checked any first morning sugar readings. I have asked him to hold off on the mealtime insulin for now and use Lantus at night, increasing by 3 units per day with a goal of sugar of 100-120 in the morning. I've asked him to see diabetes education and LC him back in 2 weeks to readdress. If he has any concerns or questions in the meanwhile he'll come back to the clinic or call. If he develops symptoms of hyperglycemia, hypoglycemia or the meter will read because it's too high the patient out of the emergency department

## 2013-08-10 LAB — OVA AND PARASITE SCREEN: OP: NONE SEEN

## 2013-08-10 LAB — CLOSTRIDIUM DIFFICILE EIA: CDIFTX: NEGATIVE

## 2013-08-13 LAB — STOOL CULTURE

## 2013-08-23 ENCOUNTER — Ambulatory Visit: Payer: No Typology Code available for payment source | Attending: Internal Medicine

## 2013-08-23 DIAGNOSIS — E119 Type 2 diabetes mellitus without complications: Secondary | ICD-10-CM

## 2013-08-23 LAB — GLUCOSE, POCT (MANUAL RESULT ENTRY): POC Glucose: 78 mg/dl (ref 70–99)

## 2013-09-04 ENCOUNTER — Ambulatory Visit: Payer: Self-pay | Admitting: *Deleted

## 2013-09-07 ENCOUNTER — Ambulatory Visit: Payer: Medicaid Other | Attending: Internal Medicine

## 2013-09-19 ENCOUNTER — Encounter: Payer: Self-pay | Admitting: Internal Medicine

## 2013-09-19 ENCOUNTER — Ambulatory Visit: Payer: No Typology Code available for payment source | Attending: Internal Medicine | Admitting: Internal Medicine

## 2013-09-19 VITALS — BP 120/82 | HR 87 | Temp 98.9°F | Resp 14 | Ht 70.0 in | Wt 278.8 lb

## 2013-09-19 DIAGNOSIS — IMO0001 Reserved for inherently not codable concepts without codable children: Secondary | ICD-10-CM

## 2013-09-19 DIAGNOSIS — E119 Type 2 diabetes mellitus without complications: Secondary | ICD-10-CM | POA: Insufficient documentation

## 2013-09-19 DIAGNOSIS — E1165 Type 2 diabetes mellitus with hyperglycemia: Secondary | ICD-10-CM

## 2013-09-19 DIAGNOSIS — Z86718 Personal history of other venous thrombosis and embolism: Secondary | ICD-10-CM | POA: Insufficient documentation

## 2013-09-19 DIAGNOSIS — G4730 Sleep apnea, unspecified: Secondary | ICD-10-CM | POA: Insufficient documentation

## 2013-09-19 DIAGNOSIS — E669 Obesity, unspecified: Secondary | ICD-10-CM | POA: Insufficient documentation

## 2013-09-19 DIAGNOSIS — IMO0002 Reserved for concepts with insufficient information to code with codable children: Secondary | ICD-10-CM

## 2013-09-19 DIAGNOSIS — Z09 Encounter for follow-up examination after completed treatment for conditions other than malignant neoplasm: Secondary | ICD-10-CM | POA: Insufficient documentation

## 2013-09-19 DIAGNOSIS — E785 Hyperlipidemia, unspecified: Secondary | ICD-10-CM | POA: Insufficient documentation

## 2013-09-19 DIAGNOSIS — Z794 Long term (current) use of insulin: Secondary | ICD-10-CM | POA: Insufficient documentation

## 2013-09-19 DIAGNOSIS — Z7982 Long term (current) use of aspirin: Secondary | ICD-10-CM | POA: Insufficient documentation

## 2013-09-19 DIAGNOSIS — F411 Generalized anxiety disorder: Secondary | ICD-10-CM | POA: Insufficient documentation

## 2013-09-19 DIAGNOSIS — G40909 Epilepsy, unspecified, not intractable, without status epilepticus: Secondary | ICD-10-CM | POA: Insufficient documentation

## 2013-09-19 LAB — GLUCOSE, POCT (MANUAL RESULT ENTRY): POC Glucose: 346 mg/dl — AB (ref 70–99)

## 2013-09-19 LAB — POCT GLYCOSYLATED HEMOGLOBIN (HGB A1C): HEMOGLOBIN A1C: 9.8

## 2013-09-19 MED ORDER — GLYBURIDE 5 MG PO TABS
5.0000 mg | ORAL_TABLET | Freq: Every day | ORAL | Status: DC
Start: 2013-09-19 — End: 2013-10-17

## 2013-09-19 NOTE — Progress Notes (Signed)
MRN: [id number] Name: Bryan Mcmillan  Sex: male Age: 43 y.o. DOB: November 26, 1970  Allergies: Penicillins  Chief Complaint  Patient presents with  . Follow-up    Diabetes    HPI: Patient is 43 y.o. male who has history of diabetes which has been uncontrolled her, as per patient he has been taking Levemir 90  units each bedtime and regular insulin sliding scale 6 to 9  units usually, as per patient his fasting sugar is usually more than 200 mg/dL denies any hypoglycemic symptoms, patient used to follow up with the endocrinologist in the past, he has modified his diet, today his hemoglobin A1c is 9.8% which is improved compared to the September last year, patient denies any acute symptoms.  Past Medical History  Diagnosis Date  . Asthma   . Depression   . Diabetes   . Headache(784.0)     frequent  . Migraines   . History of blood clots   . Hyperlipidemia   . Obesity   . Sleep apnea   . DVT (deep venous thrombosis), hx of recurrent 05/23/2012  . History of blood clot in brain, 2012 - followed by [facility] 05/23/2012  . Seizure disorder - followed by [facility] 05/23/2012  . Hyperlipemia 08/25/2012  . Anxiety   . Tuberculosis   . Pneumonia   . Kidney disease   . Kidney stones   . Seizure     Past Surgical History  Procedure Laterality Date  . Brain surgery    . Filter for blood clots        Medication List       This list is accurate as of: 09/19/13  5:35 PM.  Always use your most recent med list.               aspirin-acetaminophen-caffeine 250-250-65 MG per tablet  Commonly known as:  EXCEDRIN MIGRAINE  Take 1 tablet by mouth every 6 (six) hours as needed for pain. Headaches     diphenoxylate-atropine 2.5-0.025 MG per tablet  Commonly known as:  LOMOTIL  Take 1 tablet by mouth 4 (four) times daily as needed for diarrhea or loose stools.     enoxaparin 150 MG/ML injection  Commonly known as:  LOVENOX  Inject 1 mL (150 mg total) into the skin  every 12 (twelve) hours. For blood clots     gabapentin 300 MG capsule  Commonly known as:  NEURONTIN  Take 1 capsule (300 mg total) by mouth 3 (three) times daily.     glyBURIDE 5 MG tablet  Commonly known as:  DIABETA  Take 1 tablet (5 mg total) by mouth daily with breakfast.     hydrOXYzine 25 MG tablet  Commonly known as:  ATARAX/VISTARIL  Take 50 mg by mouth at bedtime.     insulin glargine 100 UNIT/ML injection  Commonly known as:  LANTUS  Inject 0.4 mLs (40 Units total) into the skin at bedtime. For diabetes management     levETIRAcetam 500 MG tablet  Commonly known as:  KEPPRA  Take 1 tablet (500 mg total) by mouth 2 (two) times daily.     meclizine 25 MG tablet  Commonly known as:  ANTIVERT  Take 1 tablet (25 mg total) by mouth 3 (three) times daily as needed for nausea.     metFORMIN 1000 MG tablet  Commonly known as:  GLUCOPHAGE  Take 1 tablet (1,000 mg total) by mouth 2 (two) times daily with a meal. For diabetes management  simvastatin 80 MG tablet  Commonly known as:  ZOCOR  Take 1 tablet (80 mg total) by mouth daily.     traZODone 25 mg Tabs tablet  Commonly known as:  DESYREL  Take 0.5 tablets (25 mg total) by mouth at bedtime as needed for sleep.     Vilazodone HCl 40 MG Tabs  Commonly known as:  VIIBRYD  Take 1 tablet (40 mg total) by mouth daily.        Meds ordered this encounter  Medications  . glyBURIDE (DIABETA) 5 MG tablet    Sig: Take 1 tablet (5 mg total) by mouth daily with breakfast.    Dispense:  30 tablet    Refill:  3    Immunization History  Administered Date(s) Administered  . Influenza Whole 05/02/2012    Family History  Problem Relation Age of Onset  . Hyperlipidemia      parent  . Heart disease      parent  . Stroke      parent  . Diabetes      grandparent /parent  . Obesity Other   . Heart attack Other     History  Substance Use Topics  . Smoking status: Former Research scientist (life sciences)  . Smokeless tobacco: Not on file    . Alcohol Use: No    Review of Systems   As noted in HPI  Filed Vitals:   09/19/13 1651  BP: 120/82  Pulse: 87  Temp: 98.9 F (37.2 C)  Resp: 14    Physical Exam  Physical Exam  Constitutional: No distress.  Eyes: Pupils are equal, round, and reactive to light.  Cardiovascular: Normal rate and regular rhythm.   Pulmonary/Chest: Breath sounds normal. No respiratory distress. He has no wheezes. He has no rales.  Musculoskeletal: He exhibits no edema.    CBC    Component Value Date/Time   WBC 8.7 07/11/2013 1808   RBC 4.95 07/11/2013 1808   HGB 15.3 07/11/2013 1825   HCT 45.0 07/11/2013 1825   PLT 324 07/11/2013 1808   MCV 86.9 07/11/2013 1808   LYMPHSABS 3.6 07/11/2013 1808   MONOABS 0.6 07/11/2013 1808   EOSABS 0.1 07/11/2013 1808   BASOSABS 0.0 07/11/2013 1808    CMP     Component Value Date/Time   NA 142 07/11/2013 1825   K 3.4* 07/11/2013 1825   CL 102 07/11/2013 1825   CO2 24 07/11/2013 1808   GLUCOSE 183* 07/11/2013 1825   BUN 15 07/11/2013 1825   CREATININE 1.10 07/11/2013 1825   CALCIUM 9.6 07/11/2013 1808   PROT 7.7 07/11/2013 1808   ALBUMIN 3.9 07/11/2013 1808   AST 20 07/11/2013 1808   ALT 33 07/11/2013 1808   ALKPHOS 63 07/11/2013 1808   BILITOT 0.3 07/11/2013 1808   GFRNONAA >90 07/11/2013 1808   GFRAA >90 07/11/2013 1808    Lab Results  Component Value Date/Time   CHOL 167 11/24/2012  9:21 AM    No components found with this basename: hga1c    Lab Results  Component Value Date/Time   AST 20 07/11/2013  6:08 PM   Results for orders placed in visit on 09/19/13  GLUCOSE, POCT (MANUAL RESULT ENTRY)      Result Value Ref Range   POC Glucose 346 (*) 70 - 99 mg/dl  POCT GLYCOSYLATED HEMOGLOBIN (HGB A1C)      Result Value Ref Range   Hemoglobin A1C 9.8      Assessment and Plan  DM (diabetes mellitus), type  2, uncontrolled - Plan: Glucose (CBG), HgB A1c 9.8%, uncontrolled patient is on Lantus and regular insulin, metformin, I  have started patient on glyburide 5 mg daily, advised to monitor his fingerstick also Ambulatory referral to Endocrinology, glyBURIDE (DIABETA) 5 MG tablet, COMPLETE METABOLIC PANEL WITH GFR Blood pressure is well controlled  Hyperlipemia - Plan: Continue simvastatin will repeat fasting Lipid panel  Return in about 3 months (around 12/17/2013).  Lorayne Marek, MD

## 2013-09-19 NOTE — Progress Notes (Signed)
Patient is here for a diabetes follow up. Has an history of uncontrolled hyperglycemia. Complains of acid reflex and upset stomach x2 months with burping, fatigue, dizziness and nausea. Symptoms occur with any foods eaten. Also complains of migraines x1 month. May have a possible tooth infection due to pain on Rt side while eating anything.

## 2013-09-23 ENCOUNTER — Emergency Department (HOSPITAL_COMMUNITY)
Admission: EM | Admit: 2013-09-23 | Discharge: 2013-09-24 | Disposition: A | Discharge status: Other Acute Inpatient Hospital | Payer: Medicaid Other | Attending: Emergency Medicine | Admitting: Emergency Medicine

## 2013-09-23 ENCOUNTER — Emergency Department (HOSPITAL_COMMUNITY): Payer: Medicaid Other

## 2013-09-23 ENCOUNTER — Encounter (HOSPITAL_COMMUNITY): Payer: Self-pay | Admitting: Emergency Medicine

## 2013-09-23 DIAGNOSIS — Z794 Long term (current) use of insulin: Secondary | ICD-10-CM | POA: Insufficient documentation

## 2013-09-23 DIAGNOSIS — Z8701 Personal history of pneumonia (recurrent): Secondary | ICD-10-CM | POA: Diagnosis not present

## 2013-09-23 DIAGNOSIS — Z87442 Personal history of urinary calculi: Secondary | ICD-10-CM | POA: Insufficient documentation

## 2013-09-23 DIAGNOSIS — F430 Acute stress reaction: Secondary | ICD-10-CM | POA: Insufficient documentation

## 2013-09-23 DIAGNOSIS — G4730 Sleep apnea, unspecified: Secondary | ICD-10-CM | POA: Diagnosis not present

## 2013-09-23 DIAGNOSIS — Z9889 Other specified postprocedural states: Secondary | ICD-10-CM | POA: Diagnosis not present

## 2013-09-23 DIAGNOSIS — F339 Major depressive disorder, recurrent, unspecified: Secondary | ICD-10-CM

## 2013-09-23 DIAGNOSIS — Z79899 Other long term (current) drug therapy: Secondary | ICD-10-CM | POA: Insufficient documentation

## 2013-09-23 DIAGNOSIS — F3289 Other specified depressive episodes: Secondary | ICD-10-CM | POA: Diagnosis not present

## 2013-09-23 DIAGNOSIS — G43909 Migraine, unspecified, not intractable, without status migrainosus: Secondary | ICD-10-CM | POA: Insufficient documentation

## 2013-09-23 DIAGNOSIS — R45851 Suicidal ideations: Secondary | ICD-10-CM | POA: Diagnosis not present

## 2013-09-23 DIAGNOSIS — F329 Major depressive disorder, single episode, unspecified: Secondary | ICD-10-CM | POA: Diagnosis not present

## 2013-09-23 DIAGNOSIS — F411 Generalized anxiety disorder: Secondary | ICD-10-CM | POA: Insufficient documentation

## 2013-09-23 DIAGNOSIS — Z8611 Personal history of tuberculosis: Secondary | ICD-10-CM | POA: Diagnosis not present

## 2013-09-23 DIAGNOSIS — Z046 Encounter for general psychiatric examination, requested by authority: Secondary | ICD-10-CM | POA: Diagnosis present

## 2013-09-23 DIAGNOSIS — G40909 Epilepsy, unspecified, not intractable, without status epilepticus: Secondary | ICD-10-CM | POA: Insufficient documentation

## 2013-09-23 DIAGNOSIS — Z7901 Long term (current) use of anticoagulants: Secondary | ICD-10-CM | POA: Diagnosis not present

## 2013-09-23 DIAGNOSIS — J45909 Unspecified asthma, uncomplicated: Secondary | ICD-10-CM | POA: Diagnosis not present

## 2013-09-23 DIAGNOSIS — R739 Hyperglycemia, unspecified: Secondary | ICD-10-CM

## 2013-09-23 DIAGNOSIS — Z86718 Personal history of other venous thrombosis and embolism: Secondary | ICD-10-CM | POA: Insufficient documentation

## 2013-09-23 DIAGNOSIS — Z87448 Personal history of other diseases of urinary system: Secondary | ICD-10-CM | POA: Insufficient documentation

## 2013-09-23 DIAGNOSIS — E785 Hyperlipidemia, unspecified: Secondary | ICD-10-CM | POA: Insufficient documentation

## 2013-09-23 DIAGNOSIS — Z880 Allergy status to penicillin: Secondary | ICD-10-CM | POA: Diagnosis not present

## 2013-09-23 DIAGNOSIS — E119 Type 2 diabetes mellitus without complications: Secondary | ICD-10-CM | POA: Diagnosis not present

## 2013-09-23 DIAGNOSIS — Z87891 Personal history of nicotine dependence: Secondary | ICD-10-CM | POA: Diagnosis not present

## 2013-09-23 DIAGNOSIS — F332 Major depressive disorder, recurrent severe without psychotic features: Secondary | ICD-10-CM

## 2013-09-23 DIAGNOSIS — E669 Obesity, unspecified: Secondary | ICD-10-CM | POA: Insufficient documentation

## 2013-09-23 LAB — RAPID URINE DRUG SCREEN, HOSP PERFORMED
Amphetamines: NOT DETECTED
BARBITURATES: NOT DETECTED
Benzodiazepines: NOT DETECTED
Cocaine: NOT DETECTED
Opiates: NOT DETECTED
Tetrahydrocannabinol: NOT DETECTED

## 2013-09-23 LAB — COMPREHENSIVE METABOLIC PANEL
ALBUMIN: 4.2 g/dL (ref 3.5–5.2)
ALK PHOS: 69 U/L (ref 39–117)
ALT: 53 U/L (ref 0–53)
AST: 30 U/L (ref 0–37)
BILIRUBIN TOTAL: 0.2 mg/dL — AB (ref 0.3–1.2)
BUN: 14 mg/dL (ref 6–23)
CHLORIDE: 97 meq/L (ref 96–112)
CO2: 21 meq/L (ref 19–32)
CREATININE: 0.86 mg/dL (ref 0.50–1.35)
Calcium: 9.9 mg/dL (ref 8.4–10.5)
GFR calc Af Amer: >90 mL/min (ref >90)
Glucose, Bld: 284 mg/dL — ABNORMAL HIGH (ref 70–99)
POTASSIUM: 4.2 meq/L (ref 3.7–5.3)
Sodium: 137 mEq/L (ref 137–147)
Total Protein: 7.7 g/dL (ref 6.0–8.3)

## 2013-09-23 LAB — TROPONIN I

## 2013-09-23 LAB — CBC
HEMATOCRIT: 44.2 % (ref 39.0–52.0)
Hemoglobin: 15.2 g/dL (ref 13.0–17.0)
MCH: 29.5 pg (ref 26.0–34.0)
MCHC: 34.4 g/dL (ref 30.0–36.0)
MCV: 85.7 fL (ref 78.0–100.0)
PLATELETS: 310 10*3/uL (ref 150–400)
RBC: 5.16 MIL/uL (ref 4.22–5.81)
RDW: 12.5 % (ref 11.5–15.5)
WBC: 8.5 10*3/uL (ref 4.0–10.5)

## 2013-09-23 LAB — CBG MONITORING, ED
GLUCOSE-CAPILLARY: 268 mg/dL — AB (ref 70–99)
GLUCOSE-CAPILLARY: 287 mg/dL — AB (ref 70–99)
Glucose-Capillary: 245 mg/dL — ABNORMAL HIGH (ref 70–99)

## 2013-09-23 LAB — SALICYLATE LEVEL: Salicylate Lvl: <2 mg/dL — ABNORMAL LOW (ref 2.8–20.0)

## 2013-09-23 LAB — ACETAMINOPHEN LEVEL

## 2013-09-23 LAB — ETHANOL: Alcohol, Ethyl (B): <11 mg/dL (ref 0–11)

## 2013-09-23 MED ORDER — SODIUM CHLORIDE 0.9 % IV BOLUS (SEPSIS)
1000.0000 mL | Freq: Once | INTRAVENOUS | Status: AC
Start: 2013-09-23 — End: 2013-09-23
  Administered 2013-09-23: 1000 mL via INTRAVENOUS

## 2013-09-23 MED ORDER — HYDROXYZINE HCL 25 MG PO TABS
50.0000 mg | ORAL_TABLET | Freq: Every day | ORAL | Status: DC
  Administered 2013-09-23: 50 mg via ORAL
  Filled 2013-09-23: qty 2

## 2013-09-23 MED ORDER — ONDANSETRON HCL 4 MG PO TABS
4.0000 mg | ORAL_TABLET | Freq: Three times a day (TID) | ORAL | Status: DC | PRN
  Administered 2013-09-23: 4 mg via ORAL
  Filled 2013-09-23: qty 1

## 2013-09-23 MED ORDER — TRAZODONE HCL 50 MG PO TABS: 25.0000 mg | ORAL_TABLET | Freq: Every evening | ORAL | Status: DC | PRN

## 2013-09-23 MED ORDER — GLYBURIDE 5 MG PO TABS
5.0000 mg | ORAL_TABLET | Freq: Every day | ORAL | Status: DC
  Administered 2013-09-24: 5 mg via ORAL
  Filled 2013-09-23 (×2): qty 1

## 2013-09-23 MED ORDER — METFORMIN HCL 500 MG PO TABS
1000.0000 mg | ORAL_TABLET | Freq: Two times a day (BID) | ORAL | Status: DC
  Administered 2013-09-23 – 2013-09-24 (×3): 1000 mg via ORAL
  Filled 2013-09-23 (×4): qty 2

## 2013-09-23 MED ORDER — LEVETIRACETAM 500 MG PO TABS
500.0000 mg | ORAL_TABLET | Freq: Two times a day (BID) | ORAL | Status: DC
  Administered 2013-09-23 – 2013-09-24 (×2): 500 mg via ORAL
  Filled 2013-09-23 (×3): qty 1

## 2013-09-23 MED ORDER — LEVETIRACETAM 500 MG PO TABS
500.0000 mg | ORAL_TABLET | Freq: Two times a day (BID) | ORAL | Status: DC
  Filled 2013-09-23 (×2): qty 1

## 2013-09-23 MED ORDER — DIPHENOXYLATE-ATROPINE 2.5-0.025 MG PO TABS: 1.0000 | ORAL_TABLET | Freq: Four times a day (QID) | ORAL | Status: DC | PRN

## 2013-09-23 MED ORDER — ENOXAPARIN SODIUM 150 MG/ML ~~LOC~~ SOLN
150.0000 mg | Freq: Two times a day (BID) | SUBCUTANEOUS | Status: DC
  Administered 2013-09-23 – 2013-09-24 (×2): 150 mg via SUBCUTANEOUS
  Filled 2013-09-23 (×4): qty 1

## 2013-09-23 MED ORDER — ALUM & MAG HYDROXIDE-SIMETH 200-200-20 MG/5ML PO SUSP: 30.0000 mL | ORAL | Status: DC | PRN

## 2013-09-23 MED ORDER — INSULIN GLARGINE 100 UNIT/ML ~~LOC~~ SOLN
40.0000 [IU] | Freq: Every day | SUBCUTANEOUS | Status: DC
  Administered 2013-09-24: 40 [IU] via SUBCUTANEOUS
  Filled 2013-09-23 (×3): qty 0.4

## 2013-09-23 MED ORDER — GABAPENTIN 300 MG PO CAPS
300.0000 mg | ORAL_CAPSULE | Freq: Three times a day (TID) | ORAL | Status: DC
  Administered 2013-09-23 – 2013-09-24 (×4): 300 mg via ORAL
  Filled 2013-09-23 (×6): qty 1

## 2013-09-23 MED ORDER — NICOTINE 21 MG/24HR TD PT24: 21.0000 mg | MEDICATED_PATCH | Freq: Every day | TRANSDERMAL | Status: DC

## 2013-09-23 MED ORDER — VILAZODONE HCL 40 MG PO TABS
40.0000 mg | ORAL_TABLET | Freq: Every day | ORAL | Status: DC
  Administered 2013-09-23 – 2013-09-24 (×2): 40 mg via ORAL
  Filled 2013-09-23 (×2): qty 1

## 2013-09-23 MED ORDER — ATORVASTATIN CALCIUM 40 MG PO TABS
40.0000 mg | ORAL_TABLET | Freq: Every day | ORAL | Status: DC
  Administered 2013-09-23: 40 mg via ORAL
  Filled 2013-09-23 (×3): qty 1

## 2013-09-23 NOTE — Progress Notes (Signed)
Pts referral has been faxed to the following facilities with bed available:   [facility]- per Shirlean Mylar beds available, referral faxed [facility]- per Vaughan Basta can fax, referral faxed [facility]- per Judson Roch wait list only, referral faxed [facility]- per Merry Proud can fax, referral faxed  The following facilities were contacted but at capacity: [facility] [place] Disposition [facility]

## 2013-09-23 NOTE — BH Assessment (Signed)
Tele Assessment Note   Bryan Mcmillan is an 43 y.o. male that was assessed this day via tele assessment with this clinician.  Clinical information gathered from [facility] @ 9767 and pt's tele assessment appt scheduled with pt's nurse, [place], @ [phone] for 1430 and completed.  Pt was referred by [employer] after the [employer] (where pt resides) called [employer] stating pt tried to harm himself by trying to inject himself with his Lovenox needle.  In [employer] custody, he then tried to cut his wrist with handcuffs.  Pt reported that he did this in an effort to harm himself, reporting suicidal ideation and intent.  Pt stated his family "doesn't want to be around" him and that at church this AM, his clergy members told him to find another church because his family "didn't feel comfortable" around him.  Pt stated he has been having increasing depression over the past week and a half, but now has SI.  He also reports hearing a voice "telling me to die."  Pt denies HI.  Pt denies SA.  Pt has a hx of being verbally aggressive with his mother per his last assessment, but denies currently.  He stated he went to visit his sister and her children and "they didn't want to be around me."  "I don't want to live."  Pt has had SI in past and had inpatient treatment at [facility] in 2014, last admission 9/14.  Pt stated he has been going to [facility] for med management and takes his medications as prescribed.  Pt reported he is on Adderall XR, Neurontin, Vistaril, Vibryd, and Desyrel.  Pt calm, cooperative and tearful.  [employer] placing pt under IVC per ED notes.  Consulted with EDP Zenia Resides, who agreed inpatient treatment warranted @ 1447.  Consulted with Mosetta Pigeon, NP, @ [phone], who accepted pt to [address] bed pending available bed.  As there are no beds currently at [facility], placement will be sought elsewhere by TTS.  TTS staff updated.    Axis I: 296.34 Major Depressive Disorder, Recurrent, Severe, With Psychotic Features Axis II: Deferred Axis  III:  Past Medical History  Diagnosis Date  . Asthma   . Depression   . Diabetes   . Headache(784.0)     frequent  . Migraines   . History of blood clots   . Hyperlipidemia   . Obesity   . Sleep apnea   . DVT (deep venous thrombosis), hx of recurrent 05/23/2012  . History of blood clot in brain, 2012 - followed by [facility] 05/23/2012  . Seizure disorder - followed by [facility] 05/23/2012  . Hyperlipemia 08/25/2012  . Anxiety   . Tuberculosis   . Pneumonia   . Kidney disease   . Kidney stones   . Seizure    Axis IV: economic problems, housing problems, other psychosocial or environmental problems and problems with primary support group Axis V: 11-20 some danger of hurting self or others possible OR occasionally fails to maintain minimal personal hygiene OR gross impairment in communication  Past Medical History:  Past Medical History  Diagnosis Date  . Asthma   . Depression   . Diabetes   . Headache(784.0)     frequent  . Migraines   . History of blood clots   . Hyperlipidemia   . Obesity   . Sleep apnea   . DVT (deep venous thrombosis), hx of recurrent 05/23/2012  . History of blood clot in brain, 2012 - followed by [facility] 05/23/2012  .  Seizure disorder - followed by [facility] 05/23/2012  . Hyperlipemia 08/25/2012  . Anxiety   . Tuberculosis   . Pneumonia   . Kidney disease   . Kidney stones   . Seizure     Past Surgical History  Procedure Laterality Date  . Brain surgery    . Filter for blood clots      Family History:  Family History  Problem Relation Age of Onset  . Hyperlipidemia      parent  . Heart disease      parent  . Stroke      parent  . Diabetes      grandparent /parent  . Obesity Other   . Heart attack Other     Social History:  reports that he has quit smoking. He does not have any smokeless tobacco history on file. He reports that he does not drink alcohol or use illicit drugs.  Additional Social History:   Alcohol / Drug Use Pain Medications: see med list Prescriptions: see med list Over the Counter: see med list History of alcohol / drug use?: No history of alcohol / drug abuse Longest period of sobriety (when/how long): na Negative Consequences of Use:  (na) Withdrawal Symptoms:  (na)  CIWA: CIWA-Ar BP: 118/75 mmHg Pulse Rate: 112 COWS:    Allergies:  Allergies  Allergen Reactions  . Penicillins Other (See Comments)    "childhood reaction"    Home Medications:  (Not in a hospital admission)  OB/GYN Status:  No LMP for male patient.  General Assessment Data Location of Assessment: [facility] Is this a Tele or Face-to-Face Assessment?: Tele Assessment Is this an Initial Assessment or a Re-assessment for this encounter?: Initial Assessment Living Arrangements: Other (Comment) Advertising account planner) Can pt return to current living arrangement?: Yes Admission Status: Voluntary Is patient capable of signing voluntary admission?: Yes Transfer from: [facility] Referral Source: Other Advertising account planner)  Medical Screening Exam ([place]) Medical Exam completed: No Reason for MSE not completed: Other: (pt med cleared at [facility])  [place]: Other (Comment) Advertising account planner) Name of Psychiatrist: [facility] - Dr. Loletha Grayer Name of Therapist: none  Education Status Is patient currently in school?: No Highest grade of school patient has completed: @ Associate's degrees, Bachelor Degree  Risk to self Suicidal Ideation: Yes-Currently Present Suicidal Intent: Yes-Currently Present Is patient at risk for suicide?: Yes Suicidal Plan?: Yes-Currently Present Specify Current Suicidal Plan: pt tried to inject himself with syringe and cut wrist with handcuffs Access to Means: Yes Specify Access to Suicidal Means: had access to syringe and handcuffs What has been your use of drugs/alcohol within the last 12 months?: pt denies Previous Attempts/Gestures: No  (thoughts only) How many times?: 0 Other Self Harm Risks: pt denies Triggers for Past Attempts: Family contact;Other (Comment) (conflict with mother, hearing voices) Intentional Self Injurious Behavior: None Family Suicide History: No Recent stressful life event(s): Conflict (Comment);Loss (Comment);Recent negative physical changes;Turmoil (Comment) (Suicide attempt, depression, conflict with family, medical) Persecutory voices/beliefs?: Yes Depression: Yes Depression Symptoms: Despondent;Insomnia;Tearfulness;Fatigue;Feeling worthless/self pity Substance abuse history and/or treatment for substance abuse?: No Suicide prevention information given to non-admitted patients: Not applicable  Risk to Others Homicidal Ideation: No Thoughts of Harm to Others: No Current Homicidal Intent: No Current Homicidal Plan: No Access to Homicidal Means: No Identified Victim: pt denies History of harm to others?:  (Has been aggressive toward mother in past, had thoughts ) Assessment of Violence: In past 6-12 months Violent Behavior  Description: had thoughts of hurting mother in past, none currently Does patient have access to weapons?: No Criminal Charges Pending?: No Does patient have a court date: No  Psychosis Hallucinations: Auditory (reports hearing voices telling him to die) Delusions: None noted  Mental Status Report Appear/Hygiene: Disheveled Eye Contact: Poor Motor Activity: Freedom of movement;Unremarkable Speech: Logical/coherent Level of Consciousness: Alert Mood: Depressed Affect: Depressed Anxiety Level: Minimal Thought Processes: Coherent;Relevant Judgement: Impaired Orientation: Person;Place;Time;Situation Obsessive Compulsive Thoughts/Behaviors: None  Cognitive Functioning Concentration: Normal Memory: Recent Intact;Remote Intact IQ: Average Insight: Poor Impulse Control: Poor Appetite: Poor Weight Loss: 0 Weight Gain: 0 Sleep: Decreased Total Hours of Sleep:   (4-5 hrs per night) Vegetative Symptoms: None  ADLScreening [facility] Assessment Services) Patient's cognitive ability adequate to safely complete daily activities?: Yes Patient able to express need for assistance with ADLs?: Yes Independently performs ADLs?: Yes (appropriate for developmental age)  Prior Inpatient Therapy Prior Inpatient Therapy: Yes Prior Therapy Dates: 2014 Prior Therapy Facilty/Provider(s): [facility] Reason for Treatment: SI/Depression  Prior Outpatient Therapy Prior Outpatient Therapy: Yes Prior Therapy Dates: Current and in past Prior Therapy Facilty/Provider(s): [facility] (currently, Dr. Loletha Grayer) and in past, Dr. Ysidro Evert Reason for Treatment: med mgnt  ADL Screening (condition at time of admission) Patient's cognitive ability adequate to safely complete daily activities?: Yes Is the patient deaf or have difficulty hearing?: No Does the patient have difficulty seeing, even when wearing glasses/contacts?: Yes Does the patient have difficulty concentrating, remembering, or making decisions?: No Patient able to express need for assistance with ADLs?: Yes Does the patient have difficulty dressing or bathing?: No Independently performs ADLs?: Yes (appropriate for developmental age) Does the patient have difficulty walking or climbing stairs?: No  Home Assistive Devices/Equipment Home Assistive Devices/Equipment: None    Abuse/Neglect Assessment (Assessment to be complete while patient is alone) Physical Abuse: Denies Verbal Abuse: Denies Sexual Abuse: Denies Exploitation of patient/patient's resources: Denies Self-Neglect: Denies Values / Beliefs Cultural Requests During Hospitalization: None Spiritual Requests During Hospitalization: None Consults Spiritual Care Consult Needed: No Social Work Consult Needed: No Regulatory affairs officer (For Healthcare) Advance Directive: Patient does not have advance directive;Patient would not like information    Additional  Information 1:1 In Past 12 Months?: No CIRT Risk: No Elopement Risk: No Does patient have medical clearance?: Yes     Disposition:  Disposition Initial Assessment Completed for this Encounter: Yes Disposition of Patient: Inpatient treatment program Type of inpatient treatment program: Adult  Shaune Pascal, [place], [facility] Licensed Professional Counselor Triage Specialist   09/23/2013 3:11 PM

## 2013-09-23 NOTE — ED Notes (Signed)
I have spoke with pharmacy several times trying to obtain medications needed for night time meds,  I am awaiting medications to be sent,  Terri RN aware

## 2013-09-23 NOTE — ED Provider Notes (Signed)
Patient complains of left-sided parasternal chest pain onset this afternoon since being restrained. Pain is worse with changing position improved with remaining still. No shortness of breath no nausea no cough no other associated symptoms. On exam alert no distress lungs clear auscultation heart regular rate and murmurs abdomen obese nontender chest nontender however pain is reproducible when he sits up from a supine position and pain at left posterior thorax is reproduced with forcible abduction of left shoulder.  Date: 09/23/2013  Rate: 80  Rhythm: normal sinus rhythm  QRS Axis: normal  Intervals: normal  ST/T Wave abnormalities: nonspecific T wave changes  Conduction Disutrbances:none  Narrative Interpretation:   Old EKG Reviewed: Unchanged from 04/07/2013 interpreted by me 9:50 PM patient resting comfortably. He reports "I think the chest pain I had was mildly activity earlier today in  being restrained and wrestling with lot enforcement." I feel fine now. Pain felt to be musculoskeletal in etiology Results for orders placed during the hospital encounter of 09/23/13  CBC      Result Value Ref Range   WBC 8.5  4.0 - 10.5 K/uL   RBC 5.16  4.22 - 5.81 MIL/uL   Hemoglobin 15.2  13.0 - 17.0 g/dL   HCT 44.2  39.0 - 52.0 %   MCV 85.7  78.0 - 100.0 fL   MCH 29.5  26.0 - 34.0 pg   MCHC 34.4  30.0 - 36.0 g/dL   RDW 12.5  11.5 - 15.5 %   Platelets 310  150 - 400 K/uL  COMPREHENSIVE METABOLIC PANEL      Result Value Ref Range   Sodium 137  137 - 147 mEq/L   Potassium 4.2  3.7 - 5.3 mEq/L   Chloride 97  96 - 112 mEq/L   CO2 21  19 - 32 mEq/L   Glucose, Bld 284 (*) 70 - 99 mg/dL   BUN 14  6 - 23 mg/dL   Creatinine, Ser 0.86  0.50 - 1.35 mg/dL   Calcium 9.9  8.4 - 10.5 mg/dL   Total Protein 7.7  6.0 - 8.3 g/dL   Albumin 4.2  3.5 - 5.2 g/dL   AST 30  0 - 37 U/L   ALT 53  0 - 53 U/L   Alkaline Phosphatase 69  39 - 117 U/L   Total Bilirubin 0.2 (*) 0.3 - 1.2 mg/dL   GFR calc non Af Amer >90   >90 mL/min   GFR calc Af Amer >90  >90 mL/min  ETHANOL      Result Value Ref Range   Alcohol, Ethyl (B) <11  0 - 11 mg/dL  ACETAMINOPHEN LEVEL      Result Value Ref Range   Acetaminophen (Tylenol), Serum <15.0  10 - 30 ug/mL  SALICYLATE LEVEL      Result Value Ref Range   Salicylate Lvl <0.1 (*) 2.8 - 20.0 mg/dL  URINE RAPID DRUG SCREEN (HOSP PERFORMED)      Result Value Ref Range   Opiates NONE DETECTED  NONE DETECTED   Cocaine NONE DETECTED  NONE DETECTED   Benzodiazepines NONE DETECTED  NONE DETECTED   Amphetamines NONE DETECTED  NONE DETECTED   Tetrahydrocannabinol NONE DETECTED  NONE DETECTED   Barbiturates NONE DETECTED  NONE DETECTED  TROPONIN I      Result Value Ref Range   Troponin I <0.30  <0.30 ng/mL  TROPONIN I      Result Value Ref Range   Troponin I <0.30  <0.30 ng/mL  CBG MONITORING, ED      Result Value Ref Range   Glucose-Capillary 268 (*) 70 - 99 mg/dL  CBG MONITORING, ED      Result Value Ref Range   Glucose-Capillary 287 (*) 70 - 99 mg/dL   Dg Chest Port 1 View  09/23/2013   CLINICAL DATA:  Central chest pain, history diabetes, occasional smoker  EXAM: PORTABLE CHEST - 1 VIEW  COMPARISON:  Portable exam 1658 hr correlated with CTA chest of 04/07/2013  FINDINGS: Normal heart size, mediastinal contours, and pulmonary vascularity.  Lungs clear.  No pleural effusion or pneumothorax.  Bones unremarkable.  IMPRESSION: No acute abnormalities.   Electronically Signed   By: Lavonia Dana M.D.   On: 09/23/2013 17:07     Orlie Dakin, MD 09/23/13 2202

## 2013-09-23 NOTE — ED Provider Notes (Signed)
CSN: [id number]     Arrival date & time 09/23/13  1331 History   First MD Initiated Contact with Patient 09/23/13 1402     Chief Complaint  Patient presents with  . Medical Clearance     (Consider location/radiation/quality/duration/timing/severity/associated sxs/prior Treatment) The history is provided by the patient.   patient here after a suicide attempt by the ball attempting to inject himself with a syringe full of Lovenox and then he attempted to cut his wrists with police handcuffs. Admits to increased depression do to family stressors. Has been hospitalized at behavior health in the past. Denies any recent alcohol or illicit drug use. No aspirin or Tylenol usage. Denies any command hallucinations. Symptoms have been gradually worse and he has been compliant with his antidepressant medication. Denies any recent history of head trauma. No fever or chills. Nothing makes her symptoms better.  Past Medical History  Diagnosis Date  . Asthma   . Depression   . Diabetes   . Headache(784.0)     frequent  . Migraines   . History of blood clots   . Hyperlipidemia   . Obesity   . Sleep apnea   . DVT (deep venous thrombosis), hx of recurrent 05/23/2012  . History of blood clot in brain, 2012 - followed by [facility] 05/23/2012  . Seizure disorder - followed by [facility] 05/23/2012  . Hyperlipemia 08/25/2012  . Anxiety   . Tuberculosis   . Pneumonia   . Kidney disease   . Kidney stones   . Seizure    Past Surgical History  Procedure Laterality Date  . Brain surgery    . Filter for blood clots     Family History  Problem Relation Age of Onset  . Hyperlipidemia      parent  . Heart disease      parent  . Stroke      parent  . Diabetes      grandparent /parent  . Obesity Other   . Heart attack Other    History  Substance Use Topics  . Smoking status: Former Research scientist (life sciences)  . Smokeless tobacco: Not on file  . Alcohol Use: No    Review of Systems  All other systems  reviewed and are negative.      Allergies  Penicillins  Home Medications   Current Outpatient Rx  Name  Route  Sig  Dispense  Refill  . aspirin-acetaminophen-caffeine (EXCEDRIN MIGRAINE) 250-250-65 MG per tablet   Oral   Take 1 tablet by mouth every 6 (six) hours as needed for pain. Headaches   30 tablet   0   . diphenoxylate-atropine (LOMOTIL) 2.5-0.025 MG per tablet   Oral   Take 1 tablet by mouth 4 (four) times daily as needed for diarrhea or loose stools.   30 tablet   0   . enoxaparin (LOVENOX) 150 MG/ML injection   Subcutaneous   Inject 1 mL (150 mg total) into the skin every 12 (twelve) hours. For blood clots   180 Syringe   1   . gabapentin (NEURONTIN) 300 MG capsule   Oral   Take 1 capsule (300 mg total) by mouth 3 (three) times daily.   90 capsule   0   . glyBURIDE (DIABETA) 5 MG tablet   Oral   Take 1 tablet (5 mg total) by mouth daily with breakfast.   30 tablet   3   . hydrOXYzine (ATARAX/VISTARIL) 25 MG tablet   Oral   Take 50 mg  by mouth at bedtime.         . insulin glargine (LANTUS) 100 UNIT/ML injection   Subcutaneous   Inject 0.4 mLs (40 Units total) into the skin at bedtime. For diabetes management   10 mL   12   . levETIRAcetam (KEPPRA) 500 MG tablet   Oral   Take 1 tablet (500 mg total) by mouth 2 (two) times daily.   60 tablet   3   . meclizine (ANTIVERT) 25 MG tablet   Oral   Take 1 tablet (25 mg total) by mouth 3 (three) times daily as needed for nausea.   30 tablet   1   . metFORMIN (GLUCOPHAGE) 1000 MG tablet   Oral   Take 1 tablet (1,000 mg total) by mouth 2 (two) times daily with a meal. For diabetes management   60 tablet   3   . simvastatin (ZOCOR) 80 MG tablet   Oral   Take 1 tablet (80 mg total) by mouth daily.   30 tablet   2   . traZODone (DESYREL) 25 mg TABS tablet   Oral   Take 0.5 tablets (25 mg total) by mouth at bedtime as needed for sleep.   30 tablet   3   . Vilazodone HCl (VIIBRYD) 40 MG  TABS   Oral   Take 1 tablet (40 mg total) by mouth daily.   90 tablet   4    BP 118/75  Pulse 112  Temp(Src) 98.7 F (37.1 C) (Oral)  SpO2 96% Physical Exam  Nursing note and vitals reviewed. Constitutional: He is oriented to person, place, and time. He appears well-developed and well-nourished.  Non-toxic appearance. No distress.  HENT:  Head: Normocephalic and atraumatic.  Eyes: Conjunctivae, EOM and lids are normal. Pupils are equal, round, and reactive to light.  Neck: Normal range of motion. Neck supple. No tracheal deviation present. No mass present.  Cardiovascular: Regular rhythm and normal heart sounds.  Tachycardia present.  Exam reveals no gallop.   No murmur heard. Pulmonary/Chest: Effort normal and breath sounds normal. No stridor. No respiratory distress. He has no decreased breath sounds. He has no wheezes. He has no rhonchi. He has no rales.  Abdominal: Soft. Normal appearance and bowel sounds are normal. He exhibits no distension. There is no tenderness. There is no rebound and no CVA tenderness.  Musculoskeletal: Normal range of motion. He exhibits no edema and no tenderness.  Neurological: He is alert and oriented to person, place, and time. He has normal strength. No cranial nerve deficit or sensory deficit. GCS eye subscore is 4. GCS verbal subscore is 5. GCS motor subscore is 6.  Skin: Skin is warm and dry. No abrasion and no rash noted.  Psychiatric: His affect is blunt. His speech is delayed. He is withdrawn. He expresses suicidal ideation. He expresses suicidal plans.    ED Course  Procedures (including critical care time) Labs Review Labs Reviewed  CBC  COMPREHENSIVE METABOLIC PANEL  ETHANOL  ACETAMINOPHEN LEVEL  SALICYLATE LEVEL  URINE RAPID DRUG SCREEN (HOSP PERFORMED)   Imaging Review No results found.  EKG Interpretation   None       MDM   Final diagnoses:  None    Patient's blood sugar noted at 285. Will be given IV saline and will  repeat his sugar. He has been seen by behavior health and will need placement for his psychiatric emergency.    Leota Jacobsen, MD 09/23/13 [phone]

## 2013-09-23 NOTE — ED Notes (Signed)
Pt medications (two small totes) placed in belonging bags. Total of 2 belonging bags.

## 2013-09-23 NOTE — ED Notes (Signed)
Pt aware of the need for a urine sample. Urinal at bedside. 

## 2013-09-23 NOTE — ED Notes (Signed)
AC and charge aware of needed sitter,  There is no sitter available that is reason for pt being moved closer to nurses station

## 2013-09-23 NOTE — ED Notes (Signed)
Bed: SW96 Expected date:  Expected time:  Means of arrival:  Comments: Pt with [employer]
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
## 2013-09-23 NOTE — ED Notes (Signed)
Patient transferred to room 14 by stretcher.

## 2013-09-23 NOTE — ED Notes (Signed)
Pt states he uses his CPAP 4 out of 7 days/will keep pt in main ED due to CPAP usage

## 2013-09-23 NOTE — ED Notes (Signed)
Charge nurse aware of CPAP need and chest pain-move patient back to ED room 14

## 2013-09-23 NOTE — ED Notes (Signed)
Tele psych placed at bedside. 

## 2013-09-23 NOTE — ED Notes (Signed)
Dr Cathleen Fears  into see

## 2013-09-23 NOTE — ED Notes (Signed)
Bed: OQ94 Expected date:  Expected time:  Means of arrival:  Comments: closed

## 2013-09-23 NOTE — ED Notes (Signed)
Pt. Belongings and Medications are locked in locker # [phone]. Security and Nurse are aware.

## 2013-09-23 NOTE — ED Notes (Signed)
Pts belongings and meds placed back in locker # [phone]

## 2013-09-23 NOTE — ED Notes (Addendum)
Dr Cathleen Fears updated and will see

## 2013-09-23 NOTE — ED Notes (Signed)
Patient was at the [facility]. Staff called because patient was trying to "hurt himself". Patient injected himself with a syringe of "lovenox". [employer] provided syringe to this staff, no one witnessed patient injecting syringe. Patient denies pain. Patient states he was trying to hurt himself. He tried to stick himself with a needle. Patient denies pain. [employer] at bedside. [employer] is taking out IVC papers on patient for SI ideations.

## 2013-09-24 ENCOUNTER — Encounter (HOSPITAL_COMMUNITY): Payer: Self-pay | Admitting: Psychiatry

## 2013-09-24 ENCOUNTER — Encounter (HOSPITAL_COMMUNITY): Payer: Self-pay | Admitting: Behavioral Health

## 2013-09-24 ENCOUNTER — Inpatient Hospital Stay (HOSPITAL_COMMUNITY)
Admission: AD | Admit: 2013-09-24 | Discharge: 2013-10-01 | DRG: 885 | Disposition: A | Discharge status: Home / Self Care | Payer: Federal, State, Local not specified - Other | Source: Intra-hospital | Attending: Psychiatry | Admitting: Psychiatry

## 2013-09-24 DIAGNOSIS — E119 Type 2 diabetes mellitus without complications: Secondary | ICD-10-CM | POA: Diagnosis present

## 2013-09-24 DIAGNOSIS — F339 Major depressive disorder, recurrent, unspecified: Secondary | ICD-10-CM

## 2013-09-24 DIAGNOSIS — F332 Major depressive disorder, recurrent severe without psychotic features: Principal | ICD-10-CM | POA: Diagnosis present

## 2013-09-24 DIAGNOSIS — F330 Major depressive disorder, recurrent, mild: Secondary | ICD-10-CM | POA: Diagnosis present

## 2013-09-24 DIAGNOSIS — Z86718 Personal history of other venous thrombosis and embolism: Secondary | ICD-10-CM

## 2013-09-24 DIAGNOSIS — Z6839 Body mass index (BMI) 39.0-39.9, adult: Secondary | ICD-10-CM

## 2013-09-24 DIAGNOSIS — R45851 Suicidal ideations: Secondary | ICD-10-CM

## 2013-09-24 DIAGNOSIS — E669 Obesity, unspecified: Secondary | ICD-10-CM | POA: Diagnosis present

## 2013-09-24 DIAGNOSIS — E785 Hyperlipidemia, unspecified: Secondary | ICD-10-CM | POA: Diagnosis present

## 2013-09-24 DIAGNOSIS — Z598 Other problems related to housing and economic circumstances: Secondary | ICD-10-CM

## 2013-09-24 DIAGNOSIS — Z5987 Material hardship due to limited financial resources, not elsewhere classified: Secondary | ICD-10-CM

## 2013-09-24 DIAGNOSIS — Z87442 Personal history of urinary calculi: Secondary | ICD-10-CM

## 2013-09-24 LAB — GLUCOSE, CAPILLARY
GLUCOSE-CAPILLARY: 214 mg/dL — AB (ref 70–99)
Glucose-Capillary: 228 mg/dL — ABNORMAL HIGH (ref 70–99)

## 2013-09-24 LAB — CBG MONITORING, ED
Glucose-Capillary: 183 mg/dL — ABNORMAL HIGH (ref 70–99)
Glucose-Capillary: 196 mg/dL — ABNORMAL HIGH (ref 70–99)

## 2013-09-24 MED ORDER — ONDANSETRON HCL 4 MG PO TABS
4.0000 mg | ORAL_TABLET | Freq: Three times a day (TID) | ORAL | Status: DC | PRN
Start: 2013-09-24 — End: 2013-10-01
  Administered 2013-09-25: 4 mg via ORAL
  Filled 2013-09-24: qty 1

## 2013-09-24 MED ORDER — ACETAMINOPHEN 325 MG PO TABS
650.0000 mg | ORAL_TABLET | Freq: Four times a day (QID) | ORAL | Status: DC | PRN
  Administered 2013-09-30: 650 mg via ORAL
  Filled 2013-09-24: qty 2

## 2013-09-24 MED ORDER — GLYBURIDE 5 MG PO TABS
5.0000 mg | ORAL_TABLET | Freq: Every day | ORAL | Status: DC
  Administered 2013-09-25 – 2013-10-01 (×7): 5 mg via ORAL
  Filled 2013-09-24 (×9): qty 1

## 2013-09-24 MED ORDER — GABAPENTIN 300 MG PO CAPS
300.0000 mg | ORAL_CAPSULE | Freq: Three times a day (TID) | ORAL | Status: DC
Start: 2013-09-25 — End: 2013-10-01
  Administered 2013-09-25 – 2013-10-01 (×20): 300 mg via ORAL
  Filled 2013-09-24 (×25): qty 1

## 2013-09-24 MED ORDER — NICOTINE 21 MG/24HR TD PT24
21.0000 mg | MEDICATED_PATCH | Freq: Every day | TRANSDERMAL | Status: DC
  Filled 2013-09-24 (×6): qty 1

## 2013-09-24 MED ORDER — MAGNESIUM HYDROXIDE 400 MG/5ML PO SUSP: 30.0000 mL | Freq: Every day | ORAL | Status: DC | PRN

## 2013-09-24 MED ORDER — METFORMIN HCL 500 MG PO TABS
1000.0000 mg | ORAL_TABLET | Freq: Two times a day (BID) | ORAL | Status: DC
  Administered 2013-09-25 – 2013-10-01 (×13): 1000 mg via ORAL
  Filled 2013-09-24 (×18): qty 2

## 2013-09-24 MED ORDER — ATORVASTATIN CALCIUM 40 MG PO TABS
40.0000 mg | ORAL_TABLET | Freq: Every day | ORAL | Status: DC
  Administered 2013-09-25 – 2013-09-30 (×6): 40 mg via ORAL
  Filled 2013-09-24 (×8): qty 1

## 2013-09-24 MED ORDER — HYDROXYZINE HCL 50 MG PO TABS
50.0000 mg | ORAL_TABLET | Freq: Every day | ORAL | Status: DC
  Administered 2013-09-24 – 2013-09-25 (×2): 50 mg via ORAL
  Filled 2013-09-24 (×5): qty 1

## 2013-09-24 MED ORDER — LEVETIRACETAM 500 MG PO TABS
500.0000 mg | ORAL_TABLET | Freq: Two times a day (BID) | ORAL | Status: DC
  Administered 2013-09-24 – 2013-10-01 (×14): 500 mg via ORAL
  Filled 2013-09-24 (×19): qty 1

## 2013-09-24 MED ORDER — TRAZODONE HCL 50 MG PO TABS
25.0000 mg | ORAL_TABLET | Freq: Every evening | ORAL | Status: DC | PRN
  Administered 2013-09-24: 25 mg via ORAL
  Filled 2013-09-24: qty 1

## 2013-09-24 MED ORDER — ENOXAPARIN SODIUM 150 MG/ML ~~LOC~~ SOLN
130.0000 mg | Freq: Two times a day (BID) | SUBCUTANEOUS | Status: DC
  Administered 2013-09-24 – 2013-10-01 (×14): 130 mg via SUBCUTANEOUS
  Filled 2013-09-24 (×18): qty 1

## 2013-09-24 MED ORDER — VILAZODONE HCL 40 MG PO TABS
40.0000 mg | ORAL_TABLET | Freq: Every day | ORAL | Status: DC
  Administered 2013-09-25 – 2013-10-01 (×7): 40 mg via ORAL
  Filled 2013-09-24 (×9): qty 1

## 2013-09-24 MED ORDER — INSULIN GLARGINE 100 UNIT/ML ~~LOC~~ SOLN
40.0000 [IU] | Freq: Every day | SUBCUTANEOUS | Status: DC
  Administered 2013-09-24 – 2013-09-30 (×7): 40 [IU] via SUBCUTANEOUS

## 2013-09-24 NOTE — BHH Counselor (Addendum)
Per 2/22 shift report, several hospitals were listed as reviewing the pt's referral. However, only four hospitals were actually reviewing pt: [facility], [facility], [facility] and [facility].   Arnold Long, [place] Assessment Counselor

## 2013-09-24 NOTE — BHH Counselor (Signed)
Dr. De Nurse signed [facility] and copy placed in pt's chart.  Arnold Long, [place] Assessment Counselor
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
## 2013-09-24 NOTE — Progress Notes (Signed)
[facility] CL Stacy, spoke with patient about [facility]. Patient stated that his PCP was [facility]. Patient stated that he had an apt coming up soon, but could not remember when it was. CL offered to contact PCP and get apt information, patient agreed. [place] and was told that the last time patient was seen was on 2/18 and they did not have him scheduled for another apt at this time. CL explained this to patient. Patient declined CL offer to set him up with another apt, stating that he would contact them when he left the hospital.
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
## 2013-09-24 NOTE — Progress Notes (Signed)
Adult Psychoeducational Group Note  Date:  09/24/2013 Time:  10:27 PM  Group Topic/Focus:  Wrap-Up Group:   The focus of this group is to help patients review their daily goal of treatment and discuss progress on daily workbooks.  Participation Level:  Active  Participation Quality:  Appropriate  Affect:  Appropriate  Cognitive:  Appropriate  Insight: Appropriate  Engagement in Group:  Engaged  Modes of Intervention:  Support  Additional Comments:  Pt stated that positive thing that happened was that he was see some old new faces and was able to be consoled of his fear that he has when he leaves. Pt was given support and encouraged to speak to his Education officer, museum. Pt stated that he has a Hydrographic surveyor as well.   Bryan Mcmillan 09/24/2013, 10:27 PM

## 2013-09-24 NOTE — Tx Team (Signed)
Initial Interdisciplinary Treatment Plan  PATIENT STRENGTHS: (choose at least two) Ability for insight Active sense of humor Average or above average intelligence Communication skills General fund of knowledge Motivation for treatment/growth  PATIENT STRESSORS: Financial difficulties Health problems Marital or family conflict   PROBLEM LIST: Problem List/Patient Goals Date to be addressed Date deferred Reason deferred Estimated date of resolution  Suicidality 09/24/2013   2/30/2015  Depression 09/24/2013   2/30/2015  Poor Health/Diabetes 09/24/2013   2/30/2015  Homelessness 09/24/2013   2/30/2015  Poor Support System 09/24/2013   2/30/2015                           DISCHARGE CRITERIA:  Adequate post-discharge living arrangements Improved stabilization in mood, thinking, and/or behavior Medical problems require only outpatient monitoring Motivation to continue treatment in a less acute level of care Need for constant or close observation no longer present Reduction of life-threatening or endangering symptoms to within safe limits Verbal commitment to aftercare and medication compliance  PRELIMINARY DISCHARGE PLAN: Attend aftercare/continuing care group Placement in alternative living arrangements  PATIENT/FAMIILY INVOLVEMENT: This treatment plan has been presented to and reviewed with the patient, Bryan Mcmillan, and/or family member.  The patient and family have been given the opportunity to ask questions and make suggestions.  Hoyt Koch, Cordelle Dahmen Leanna Sato 09/24/2013, 7:14 PM

## 2013-09-24 NOTE — ED Notes (Signed)
Charge rn talked with [facility] at [facility], pt accepted at [facility], waiting for bed.

## 2013-09-24 NOTE — Progress Notes (Signed)
D: Pt passive SI-contracts for safety, pt +ve AH- command to hurt self.  Pt denies HI/VH. Pt is pleasant and cooperative. Pt doesn't trust self with a needle. Pt was complaining of upset stomach "maybe I have an ulcer". Pt did not want anything for his upset stomach.   A: Pt was offered support and encouragement. Pt was given scheduled medications. Pt was encourage to attend groups. Q 15 minute checks were done for safety.   R:Pt attends groups and interacts well with peers and staff. Pt is taking medication.Pt receptive to treatment and safety maintained on unit.

## 2013-09-24 NOTE — Consult Note (Deleted)
Review of Systems  Constitutional: Negative.   HENT: Negative.   Eyes: Negative.   Respiratory: Negative.   Cardiovascular: Negative.   Gastrointestinal: Negative.   Genitourinary: Negative.   Musculoskeletal: Negative.   Skin: Negative.   Neurological: Negative.   Endo/Heme/Allergies: Negative.   Psychiatric/Behavioral: Positive for depression and suicidal ideas. The patient is nervous/anxious and has insomnia.    Asa Saunas. [place], [place] 09/24/2013

## 2013-09-24 NOTE — BHH Counselor (Addendum)
Christy at [facility] states they are still reviewing pt's referral. Christy Sartorius at [facility] states pt is denied d/t being medically inappropriate. Darlene at [facility] pt still under review and she will call writer back.  [place] states that [facility] never received pt's referral. Barbaraann Rondo also stated that he wasn't able to tell writer whether there were any available beds. He says that Probation officer can send referral. Writer faxed referral.  Ines Bloomer - [facility] doesn't have the pt's referral but she does have beds available. Writer faxed referral.   Arnold Long, [place] Assessment Counselor

## 2013-09-25 DIAGNOSIS — F332 Major depressive disorder, recurrent severe without psychotic features: Principal | ICD-10-CM

## 2013-09-25 DIAGNOSIS — R45851 Suicidal ideations: Secondary | ICD-10-CM

## 2013-09-25 LAB — GLUCOSE, CAPILLARY
GLUCOSE-CAPILLARY: 221 mg/dL — AB (ref 70–99)
Glucose-Capillary: 195 mg/dL — ABNORMAL HIGH (ref 70–99)

## 2013-09-25 MED ORDER — LOPERAMIDE HCL 2 MG PO CAPS
4.0000 mg | ORAL_CAPSULE | Freq: Once | ORAL | Status: AC
  Administered 2013-09-25: 4 mg via ORAL
  Filled 2013-09-25 (×2): qty 2

## 2013-09-25 NOTE — Progress Notes (Addendum)
D:  Patient's self inventory sheet, patient sleeps fair, good appetite, low energy level, [poor attention span.  Rated depression 3, hopeless 6.  Denied withdrawals.  SI off/on, contracts for safety.  Has felt lightheaded, pain, dizziness, headaches in past 24 hours.  Pain goal 2, worst pain #4.  No discharge plans.  No problems taking meds after discharge. A:  Medications administered per MD orders.  Emotional support and encouragement given patient. R:  Denied HI.  SI off/on, contracts for safety.  Will continue to monitor patient for safety with 15 minute checks.  Safety maintained. Patient stated he usually takes vibryd at night. Patient stated his stomach feels bloated but does not feel "gasy" after his dinner tonight.

## 2013-09-25 NOTE — BHH Counselor (Signed)
Adult Psychosocial Assessment Update Interdisciplinary Team  Previous [facility] admissions/discharges:  Admissions Discharges  Date:  04/07/13 Date: 04/10/13  Date: Date:  Date: Date:  Date: Date:  Date: Date:   Changes since the last Psychosocial Assessment (including adherence to outpatient mental health and/or substance abuse treatment, situational issues contributing to decompensation and/or relapse). Patient reports admitting to hospital due to increased depression and SI.  He advised he has had increased depression for over a week.  He advised of problems with family not wanting to be around him.               Discharge Plan 1. Will you be returning to the same living situation after discharge?   Yes: No:      If no, what is your plan?    Patient reports he has a home to return to at discharge.       2. Would you like a referral for services when you are discharged? Yes:     If yes, for what services?  No:       Yes, patient advised of being followed by [facility] for medication management.       Summary and Recommendations (to be completed by the evaluator) Bryan Mcmillan is a 43 year old Caucasian male admitted with Major Depression Disorder with psychosis.  He will benefit from crisis stabilization, evaluation for medication, psycho-education groups for coping skills development, group therapy and case management for discharge planning.                        Signature:  Concha Pyo, 09/25/2013 10:23 AM

## 2013-09-25 NOTE — Progress Notes (Signed)
The focus of this group is to educate the patient on the purpose and policies of crisis stabilization and provide a format to answer questions about their admission.  The group details unit policies and expectations of patients while admitted.  Patient attended 0900 nurse education orientation group this morning.  Patient resting in group.  Today patient will work on 3 goals for discharge.

## 2013-09-25 NOTE — BHH Group Notes (Signed)
[facility] LCSW Group Therapy      Feelings About Diagnosis 1:15 - 2:30 PM         09/25/2013  2:57 PM    Type of Therapy:  Group Therapy  Participation Level:  Active  Participation Quality:  Appropriate  Affect:  Appropriate  Cognitive:  Alert and Appropriate  Insight:  Developing/Improving and Engaged  Engagement in Therapy:  Developing/Improving and Engaged  Modes of Intervention:  Discussion, Education, Exploration, Problem-Solving, Rapport Building, Support  Summary of Progress/Problems:  Patient actively participated in group. Patient discussed past and present diagnosis and the effects it has had on  life.  Patient talked about family and society being judgmental and the stigma associated with having a mental health diagnosis.  He shared he is sometimes frustrated by his diagnosis and the inability to asimulate it into his life.  He shared he is trying to center his life and set priorities.  Concha Pyo 09/25/2013  2:57 PM

## 2013-09-25 NOTE — BHH Suicide Risk Assessment (Signed)
[employer] INPATIENT:  Family/Significant Other Suicide Prevention Education  Suicide Prevention Education:  Patient Refusal for Family/Significant Other Suicide Prevention Education: The patient Bryan Mcmillan has refused to provide written consent for family/significant other to be provided Family/Significant Other Suicide Prevention Education during admission and/or prior to discharge.  Physician notified.  Concha Pyo 09/25/2013, 12:24 PM
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
## 2013-09-26 DIAGNOSIS — F411 Generalized anxiety disorder: Secondary | ICD-10-CM

## 2013-09-26 LAB — TSH: TSH: 3.164 u[IU]/mL (ref 0.350–4.500)

## 2013-09-26 LAB — T4, FREE: FREE T4: 1.02 ng/dL (ref 0.80–1.80)

## 2013-09-26 LAB — GLUCOSE, CAPILLARY: Glucose-Capillary: 185 mg/dL — ABNORMAL HIGH (ref 70–99)

## 2013-09-26 LAB — LIPID PANEL
Cholesterol: 141 mg/dL (ref 0–200)
HDL: 33 mg/dL — ABNORMAL LOW (ref >39)
LDL Cholesterol: 75 mg/dL (ref 0–99)
Total CHOL/HDL Ratio: 4.3 RATIO
Triglycerides: 165 mg/dL — ABNORMAL HIGH (ref <150)
VLDL: 33 mg/dL (ref 0–40)

## 2013-09-26 MED ORDER — HYDROXYZINE HCL 25 MG PO TABS
25.0000 mg | ORAL_TABLET | Freq: Four times a day (QID) | ORAL | Status: DC | PRN
  Administered 2013-09-28 – 2013-09-30 (×3): 25 mg via ORAL
  Filled 2013-09-26 (×4): qty 1

## 2013-09-26 MED ORDER — DOXEPIN HCL 25 MG PO CAPS
25.0000 mg | ORAL_CAPSULE | Freq: Every evening | ORAL | Status: DC | PRN
  Administered 2013-09-26 – 2013-09-28 (×3): 25 mg via ORAL
  Filled 2013-09-26 (×3): qty 1

## 2013-09-26 NOTE — Progress Notes (Signed)
Adult Psychoeducational Group Note  Date:  09/26/2013 Time:  10:00am  Group Topic/Focus:  Personal Choices and Values:   The focus of this group is to help patients assess and explore the importance of values in their lives, how their values affect their decisions, how they express their values and what opposes their expression.  Participation Level:  Active  Participation Quality:  Appropriate and Attentive  Affect:  Appropriate  Cognitive:  Alert and Appropriate  Insight: Appropriate  Engagement in Group:  Engaged  Modes of Intervention:  Discussion and Education  Additional Comments:  Pt attended and participated in group. Discussion was on personal development. Pt was asked what does personal development mean to you? Pt stated personal development means chipping away at the [place] until you become something precious.  Marlowe Shores D 09/26/2013, 2:11 PM

## 2013-09-26 NOTE — BHH Group Notes (Signed)
[facility] LCSW Aftercare Discharge Planning Group Note   09/26/2013 10:52 AM    Participation Quality:  [place]  Mood/Affect:  Appropriate  Depression Rating:  5  Anxiety Rating:  4  Thoughts of Suicide:  No  Will you contract for safety?   NA  Current AVH:  No  Plan for Discharge/Comments:  Patient attended discharge planning group and actively participated in group. He will follow up with [facility] at discharge.  CSW provided all participants with daily workbook.   Transportation Means: Patient has transportation.   Supports:  Patient has a support system.   Zaharah Amir, Eulas Post

## 2013-09-26 NOTE — Progress Notes (Signed)
Recreation Therapy Notes  Date: 02.25.2015  Time: 2:45pm Location: [address] Dayroom   Group Topic: Understanding mental health   Goal Area(s) Addresses:  Patient will identify benefits to understanding mental health. Patient will work effectively with teammates.   Behavioral Response: Engaged, Attentive  Intervention: Game  Activity: [place]. Patients were divided into groups of 4 -5 patients, working as a team patients were asked to select a category of questions and a point value. Categories included: Symptoms, Medications, Causes, Coping Skills, and Substance Abuse.   Education: Wellness, Discharge Planning   Education Outcome: Acknowledges understanding  Clinical Observations/Feedback: Patient actively engaged in group activity, working well with peers. Patient assisted teammates with answering questions. Of particular interest to group, including patient was the genetic link between substance abuse and mental illness. Patient shared stories about friends who have struggled with substance abuse and discovered a family legacy. Patient listened attentively as other group members shared stories or asked questions.    Laureen Ochs Burlin Mcnair, LRT/CTRS  Lane Hacker 09/26/2013 4:59 PM

## 2013-09-26 NOTE — Progress Notes (Signed)
[facility] Group Notes:  (Nursing/MHT/Case Management/Adjunct)  Date:  09/26/2013  Time:  12:40 AM  Type of Therapy:  Group Therapy  Participation Level:  Active  Participation Quality:  Appropriate  Affect:  Appropriate  Cognitive:  Appropriate  Insight:  Appropriate  Engagement in Group:  Engaged  Modes of Intervention:  Socialization and Support  Summary of Progress/Problems: Pt. Had good insight of group topic of recovery.  Pt. Stated he actively writes in journal as coping skill for recovery.  Bryan Mcmillan 09/26/2013, 12:40 AM

## 2013-09-26 NOTE — Progress Notes (Signed)
Patient ID: Bryan Mcmillan, male   DOB: Jul 24, 1971, 43 y.o.   MRN: [id number] D: Patient in dayroom on approach. Pt mood/affect is depressed and flat. Pt reports unable to concentrate on self here because of stressors at home. Pt reports moving to [place] after discharge to be close to 45 year old daughter. Pt is observed in dayroom interacting with peers. Pt endorses passive suicidal ideation but contracts for safety. Pt denies HI/AVH and pain. Pt attended evening wrap up group and Interacted appropriately with peers. Pt denies any needs or concerns.  Cooperative with assessment. No acute distressed noted at this time.   A: Met with pt 1:1. Medications administered as prescribed. Writer encouraged pt to discuss feelings. Pt encouraged to come to staff with any question or concerns.   R: Patient remains safe. He is complaint with medications and denies any adverse reaction. Continue current POC.

## 2013-09-26 NOTE — Progress Notes (Signed)
D: Patient presents with flat, blunted affect and depressed mood. He reported on the self inventory sheet that he's sleeping fair, appetite is good, energy level is low and ability to pay attention is poor. Patient rated depression "3" and feelings of hopelessness "2". He's visible in the milieu and participating in groups. Patient adhering to medication regimen.   A: Support and encouragement provided to patient. Scheduled medications administered per MD orders. Maintain Q15 minute checks for safety.  R: Patient receptive. Passive SI, but contracts for safety. Denies HI and auditory/visual hallucinations. Patient remains safe.
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
## 2013-09-26 NOTE — BHH Group Notes (Signed)
Adult Psychoeducational Group Note  Date:  09/26/2013 Time:  9:06 PM  Group Topic/Focus:  Wrap-Up Group:   The focus of this group is to help patients review their daily goal of treatment and discuss progress on daily workbooks.  Participation Level:  Active  Participation Quality:  Appropriate  Affect:  Appropriate  Cognitive:  Appropriate  Insight: Appropriate  Engagement in Group:  Engaged  Modes of Intervention:  Discussion  Additional Comments:  Glenda stated his day was better than yesterday.  He got the chance to work on some problems.  He also expressed that the doctor is going to switch up his medications to help him wake up.  He had good conversation and made some sure that a movie he had promised someone had been delivered.  He also stated he likes to listen to people and imitate different accents.   Victorino Sparrow A 09/26/2013, 9:06 PM

## 2013-09-26 NOTE — Progress Notes (Signed)
D:  Pt denies HI/VH. Pt is pleasant and cooperative. Pt stated he was concerned at how he felt in the morning, so he was going to skip the trazodone tonight and see how he felt in the morning.   A: Pt was offered support and encouragement. Pt was given scheduled medications. Pt was encourage to attend groups. Q 15 minute checks were done for safety.   R:Pt attends groups and interacts well with peers and staff. Pt is taking medication. Pt has no complaints at this time.Pt receptive to treatment and safety maintained on unit.

## 2013-09-26 NOTE — Progress Notes (Addendum)
Recreation Therapy Notes  Date: 02.24.2015 Time: 2:45pm Location: 500 Hall Dayroom   Group Topic: Communication, Team Building, Problem Solving  Goal Area(s) Addresses:  Patient will effectively work with peer towards shared goal.  Patient will identify skill used to make activity successful.  Patient will identify how skills used during activity can be used to reach post d/c goals.   Behavioral Response: Observation  Intervention: Problem Solving Activitiy  Activity: Life Boat. Patients were given a scenario about being on a sinking yacht. Patients were informed the yacht included 48 guest, 8 of which could be placed on the life boat, along with all group members. Individuals on guest list were of varying socioeconomic classes such as a Information systems manager, Associate Professor, Recruitment consultant, Barrister's clerk.   Education: Education officer, community, Discharge Planning   Education Outcome: Acknowledges understanding  Clinical Observations/Feedback: Patient attended group session, however seemed troubled by the distractions and disruptive patients. Patient was observed to sit with hand in head majority of group session.    Laureen Ochs Tion Tse, LRT/CTRS  Meliza Kage L 09/26/2013 1:16 PM

## 2013-09-26 NOTE — B[facility] Group Notes (Signed)
[facility] LCSW Group Therapy  Emotional Regulation 1:15 - 2: 30 PM        09/26/2013     Type of Therapy:  Group Therapy  Participation Level:  Appropriate  Participation Quality:  Appropriate  Affect:  Appropriate  Cognitive:  Attentive Appropriate  Insight:  Developing/Improving Engaged  Engagement in Therapy:  Developing/Improving Engaged  Modes of Intervention:  Discussion Exploration Problem-Solving Supportive  Summary of Progress/Problems:  Group topic was emotional regulations.  Patient participated in the discussion and was able to identify an emotion that needed to regulated. Patient shared the emotion he has problems controlling is self-pity and guilt which seems to feed on each other.  Patient shared he is not what his family expects if him or he of himself.  He shared he has to learn to accept him for who is and not what society for family wants him to be.  Patient was able to identify approprite coping skills.  Concha Pyo 09/26/2013

## 2013-09-27 LAB — GLUCOSE, CAPILLARY
GLUCOSE-CAPILLARY: 247 mg/dL — AB (ref 70–99)
GLUCOSE-CAPILLARY: 209 mg/dL — AB (ref 70–99)
Glucose-Capillary: 172 mg/dL — ABNORMAL HIGH (ref 70–99)

## 2013-09-27 NOTE — Progress Notes (Signed)
Recreation Therapy Notes  Date: 02.26.2015 Time: 2:45pm Location: 500 [address] Dayroom   Group Topic: Leisure Education  Goal Area(s) Addresses:  Patient will identify positive leisure activities.  Patient will identify positive emotions associated with leisure participation.  Patient will identify one positive benefit of participation in leisure activities.   Behavioral Response: Did not attend.   Laureen Ochs Keneisha Heckart, LRT/CTRS  Janyiah Silveri L 09/27/2013 4:02 PM

## 2013-09-27 NOTE — Progress Notes (Signed)
Patient ID: Bryan Mcmillan, male   DOB: 1971/02/01, 43 y.o.   MRN: [id number]  D: Pt. Denies HI and A/V Hallucinations. Patient does endorse passive SI but contracts for safety. Patient rates his depression 6/10 and his hopelessness at 5/10 for the day. Patient did report a slight headache but refused any intervention that this Probation officer offered. After lunch patient reported that he was feeling lightheaded and "not right" so a CBG and vitals were taken. Vitals and CBG was WDL for this patient. Writer educated patient on safety from falling. Sit and dangle before standing and make sure to sit if feeling as though he will fall. Patient verbalized understanding and reported that it is possible that it could be due to his high anxiety. Patient was offered PRN Vistaril for anxiety but refused.  A: Support and encouragement provided to the patient. Scheduled medications given to this patient per physician's orders.  R: Patient is receptive and cooperative but minimal. Patient is seen in the milieu and is attending groups. Q15 minute checks are maintained for safety.

## 2013-09-27 NOTE — Progress Notes (Signed)
Adult Psychoeducational Group Note  Date:  09/27/2013 Time: 8:30 PM  Group Topic/Focus:  Wrap-Up Group:   The focus of this group is to help patients review their daily goal of treatment and discuss progress on daily workbooks.  Participation Level:  Minimal  Participation Quality:  Appropriate  Affect:  Depressed and Flat  Cognitive:  Appropriate  Insight: Good  Engagement in Group:  Limited  Modes of Intervention:  Discussion and Support  Additional Comments:  Pt shared that in his leisure time he enjoys to write in his journal and talking with his daughter.   Bryan Mcmillan Monique 09/27/2013, 10:46 PM

## 2013-09-27 NOTE — Clinical Social Work Note (Signed)
Writer met with patient who advised he is considering relocating to [place], [place].  Patient asked if writer knew of any programs available for indigent services.  Writer researched and later advised patient os [facility].  They will provide medication management as well as counseling services.  Patient advised he will be working on transportation and where he will live.

## 2013-09-27 NOTE — Progress Notes (Signed)
Patient ID: Bryan Mcmillan, male   DOB: 04-Nov-1970, 43 y.o.   MRN: [id number]  Morning Wellness Group 9:00 A.M.  The focus of this group is to educate the patient on the purpose and policies of crisis stabilization and provide a format to answer questions about their admission.  The group details unit policies and expectations of patients while admitted.  Patient attended group and reports that he likes looking at nature landscapes and playing science fiction games for his leisure activities. Patient was attentive during group but still presented with flat affect.

## 2013-09-27 NOTE — B[facility] Group Notes (Addendum)
[facility] LCSW Group Therapy  Living A Balanced Life  1:15 - 2: 30          09/27/2013    Type of Therapy:  Group Therapy  Participation Level:  Appropriate  Participation Quality:  Appropriate  Affect:  Appropriate, Depressed  Cognitive:  Attentive Appropriate  Insight: Developing/Improving  Engagement in Therapy:  Developing/Improving  Modes of Intervention:  Discussion Exploration Problem-Solving Supportive   Summary of Progress/Problems: Topic for group was Living a Balanced Life.  Patient was able to show how life has become unbalanced.  Patient advised of life being out of balanced due to lack of employment .  He shared he feels very frustrated, overwhelmed and feels lost. Patient able to identify appropriate coping skills, including staying in the here and now.Concha Pyo 09/27/2013
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
## 2013-09-28 LAB — GLUCOSE, CAPILLARY
Glucose-Capillary: 166 mg/dL — ABNORMAL HIGH (ref 70–99)
Glucose-Capillary: 195 mg/dL — ABNORMAL HIGH (ref 70–99)
Glucose-Capillary: 219 mg/dL — ABNORMAL HIGH (ref 70–99)

## 2013-09-28 MED ORDER — IBUPROFEN 800 MG PO TABS
800.0000 mg | ORAL_TABLET | Freq: Three times a day (TID) | ORAL | Status: DC | PRN
  Administered 2013-09-29 – 2013-10-01 (×2): 800 mg via ORAL
  Filled 2013-09-28 (×2): qty 1

## 2013-09-28 NOTE — Progress Notes (Signed)
Recreation Therapy Notes  Date: 02.27.2015 Time: 2:45pm Location: [address] Dayroom    Group Topic: Communication, Team Building, Problem Solving  Goal Area(s) Addresses:  Patient will effectively work with peer towards shared goal.  Patient will identify skill used to make activity successful.  Patient will identify how skills used during activity can be used to build healthy support system.   Behavioral Response: Appropriate   Intervention: Problem Solving Activity  Activity: [employer]. Patients were provided the following materials: 5 drinking straws, 5 rubber bands, 5 paper clips, 2 index cards, 2 drinking cups, and 2 toilet paper rolls. Using the provided materials patients were asked to build a launching mechanisms to launch a ping pong ball approximately 12 feet. Patients were divided into teams of 3-5.   Education: Education officer, community, Dentist.   Education Outcome: Acknowledges understanding   Clinical Observations/Feedback: Patient actively engaged in group activity, working well with team mates and assisting with Architect of teams launching mechanism. Patient made no contributions to group discussion, but appeared to actively listen as he maintained appropriate eye contact with speaker.   Laureen Ochs Riaz Onorato, LRT/CTRS  Dorreen Valiente L 09/28/2013 4:14 PM

## 2013-09-28 NOTE — Tx Team (Signed)
Interdisciplinary Treatment Plan Update   Date Reviewed:  09/28/2013  Time Reviewed:  9:34 AM  Progress in Treatment:   Attending groups: Yes Participating in groups: Yes Taking medication as prescribed: Yes  Tolerating medication: Yes Family/Significant other contact made:  Patient advised of no collateral contact Patient understands diagnosis: Yes  Discussing patient identified problems/goals with staff: Yes Medical problems stabilized or resolved: Yes Denies suicidal/homicidal ideation: Yes Patient has not harmed self or others: Yes  For review of initial/current patient goals, please see plan of care.  Estimated Length of Stay:  3-5 days  Reasons for Continued Hospitalization:   New Problems/Goals identified:    Discharge Plan or Barriers:   Home with outpatient follow up with [facility]  Additional Comments:   Continue medication stabilization  Attendees:  Patient:  09/28/2013 9:34 AM   Signature: Mylinda Latina, MD 09/28/2013 9:34 AM  Signature:   09/28/2013 9:34 AM  Signature:  Trixie Deis, NP 09/28/2013 9:34 AM  Signature:  Luz Brazen, RN 09/28/2013 9:34 AM  Signature:   09/28/2013 9:34 AM  Signature:  Joette Catching, LCSW 09/28/2013 9:34 AM  Signature:  Regan Lemming, LCSW 09/28/2013 9:34 AM  Signature:    09/28/2013 9:34 AM  Signature:   09/28/2013 9:34 AM  Signature:  09/28/2013  9:34 AM  Signature:   Lars Pinks, RN [facility] 09/28/2013  9:34 AM  Signature:  Eduard Roux, RN 09/28/2013  9:34 AM    Scribe for Treatment Team:   Joette Catching,  09/28/2013 9:34 AM

## 2013-09-28 NOTE — Progress Notes (Signed)
Patient ID: Bryan Mcmillan, male   DOB: [birth year], 43 y.o.   MRN: [id number] D: Patient mood/affect is depressed and flat. Pt  is observed in dayroom interacting with peers. Pt endorses passive suicidal ideation but contracts for safety. Pt endorses auditory hallucination without command. Pt denies HI/VH and pain. Pt attended evening wrap up group and Interacted appropriately with peers. Pt denies any needs or concerns.  Cooperative with assessment. No acute distressed noted at this time.   A: Met with pt 1:1. Medications administered as prescribed. Writer encouraged pt to discuss feelings. Pt encouraged to come to staff with any question or concerns.   R: Patient remains safe. He is complaint with medications and denies any adverse reaction. Continue current POC.

## 2013-09-28 NOTE — Progress Notes (Signed)
D: Patient denies SI/HI and A/V hallucinations; patient reports sleep is poor; reports appetite is poor; reports energy level is low ; reports ability to pay attention is low; rates depression as 8/10; rates hopelessness 6/10; patient reports that he is trying to figure out his plan for discharge  A: Monitored q 15 minutes; patient encouraged to attend groups; patient educated about medications; patient given medications per physician orders; patient encouraged to express feelings and/or concerns  R: Patient is flat and sad; patient reports its mostly because of a lack of plan; patient is appropriate;  patient's interaction with staff and peers is appropriate; patient was able to set goal to talk with staff 1:1 when having feelings of SI; patient is taking medications as prescribed and tolerating medications; patient is attending some groups

## 2013-09-28 NOTE — Progress Notes (Signed)
Patient reported to the nurse that he bit down on his tooth and a piece is loose; patient came back to this Probation officer and he had pulled the piece of tooth out and showed it to this Probation officer; patient reporting no pain at this time or other distress; there is no blood at the site; nurse practitioner Reita Cliche made aware at 1820 hrs and order given for ibuprofen but no orders given; patient made aware that the nurse practitioner was made aware of his situation and the order given; Probation officer will notify if any changes occur

## 2013-09-28 NOTE — Progress Notes (Signed)
Patient ID: Bryan Mcmillan, male   DOB: [birth year], 43 y.o.   MRN: [id number] [facility] MD Progress Note  09/28/2013 12:08 PM Ameen Mostafa  MRN:  [id number] Subjective: Bryan Mcmillan admitted with increased symptoms of depression, anxiety, anger outbursts and suicidal ideation with a plan of injecting medication or stabbing with needles.   Patient stated that he could not sleep throughout because of worries about his disposition plans including contact members of his friends and Deloris Ping is going to stay in [place]. Reportedly he spoke with his brother who is willing to provide transportation to a local bus depot and may buy tickets for him. He is also going to have him to [place] [employer] [place] he can pick up his personal stuff.  Patient has rated 8/10 depression and anxiety at 5-6/10. He denied current suicidal thoughts contract for safety while in the hospital.    Diagnosis:   DSM5: Schizophrenia Disorders:   Obsessive-Compulsive Disorders:   Trauma-Stressor Disorders:   Substance/Addictive Disorders:   Depressive Disorders:  Major Depressive Disorder - Severe (296.23) Total Time spent with patient: 30 minutes  Axis I: Major Depression, Recurrent severe  ADL's:  Intact  Sleep: Good  Appetite:  Fair  Suicidal Ideation:  Denies suicidal ideation and contract for safety Homicidal Ideation:  denied AEB (as evidenced by):  Psychiatric Specialty Exam: Physical Exam  ROS  Blood pressure 138/81, pulse 96, temperature 98.5 F (36.9 C), temperature source Oral, resp. rate 16, height 5\' 10"  (1.778 m), weight 126.1 kg (278 lb), SpO2 94.00%.Body mass index is 39.89 kg/(m^2).  General Appearance: Disheveled and Guarded  Eye Sport and exercise psychologist::  Fair  Speech:  Clear and Coherent  Volume:  Decreased  Mood:  Anxious, Depressed, Hopeless and Worthless  Affect:  Constricted and Depressed  Thought Process:  Goal Directed and Intact  Orientation:  Full (Time, Place, and Person)  Thought  Content:  Rumination  Suicidal Thoughts:  Yes.  without intent/plan  Homicidal Thoughts:  No  Memory:  Immediate;   Fair  Judgement:  Fair  Insight:  Lacking  Psychomotor Activity:  Psychomotor Retardation and Restlessness  Concentration:  Good  Recall:  Good  Fund of Knowledge:Good  Language: Good  Akathisia:  NA  Handed:  Right  AIMS (if indicated):     Assets:  Communication Skills Desire for Improvement Physical Health Resilience Social Support  Sleep:  Number of Hours: 5.75   Musculoskeletal: Strength & Muscle Tone: within normal limits Gait & Station: normal Patient leans: N/A  Current Medications: Current Facility-Administered Medications  Medication Dose Route Frequency Provider Last Rate Last Dose  . acetaminophen (TYLENOL) tablet 650 mg  650 mg Oral Q6H PRN Waylan Boga, NP      . atorvastatin (LIPITOR) tablet 40 mg  40 mg Oral q1800 Waylan Boga, NP   40 mg at 09/27/13 1813  . doxepin (SINEQUAN) capsule 25 mg  25 mg Oral QHS PRN,MR X 1 Benjamine Mola, FNP   25 mg at 09/27/13 2118  . enoxaparin (LOVENOX) injection 130 mg  130 mg Subcutaneous Q12H Waylan Boga, NP   130 mg at 09/28/13 0807  . gabapentin (NEURONTIN) capsule 300 mg  300 mg Oral TID Waylan Boga, NP   300 mg at 09/28/13 1159  . glyBURIDE (DIABETA) tablet 5 mg  5 mg Oral Q breakfast Waylan Boga, NP   5 mg at 09/28/13 0804  . hydrOXYzine (ATARAX/VISTARIL) tablet 25 mg  25 mg Oral Q6H PRN Benjamine Mola, FNP      .  insulin glargine (LANTUS) injection 40 Units  40 Units Subcutaneous QHS Waylan Boga, NP   40 Units at 09/27/13 2120  . levETIRAcetam (KEPPRA) tablet 500 mg  500 mg Oral BID Waylan Boga, NP   500 mg at 09/28/13 0804  . magnesium hydroxide (MILK OF MAGNESIA) suspension 30 mL  30 mL Oral Daily PRN Waylan Boga, NP      . metFORMIN (GLUCOPHAGE) tablet 1,000 mg  1,000 mg Oral BID WC Waylan Boga, NP   1,000 mg at 09/28/13 0804  . nicotine (NICODERM CQ - dosed in mg/24 hours) patch 21 mg  21 mg  Transdermal Daily Waylan Boga, NP      . ondansetron (ZOFRAN) tablet 4 mg  4 mg Oral Q8H PRN Waylan Boga, NP   4 mg at 09/25/13 1447  . Vilazodone HCl (VIIBRYD) TABS 40 mg  40 mg Oral Daily Waylan Boga, NP   40 mg at 09/28/13 9323    Lab Results:  Results for orders placed during the hospital encounter of 09/24/13 (from the past 48 hour(s))  GLUCOSE, CAPILLARY     Status: Abnormal   Collection Time    09/27/13  6:09 AM      Result Value Ref Range   Glucose-Capillary 172 (*) 70 - 99 mg/dL  GLUCOSE, CAPILLARY     Status: Abnormal   Collection Time    09/27/13 12:59 PM      Result Value Ref Range   Glucose-Capillary 247 (*) 70 - 99 mg/dL  GLUCOSE, CAPILLARY     Status: Abnormal   Collection Time    09/27/13  9:08 PM      Result Value Ref Range   Glucose-Capillary 209 (*) 70 - 99 mg/dL   Comment 1 Notify RN    GLUCOSE, CAPILLARY     Status: Abnormal   Collection Time    09/28/13  6:16 AM      Result Value Ref Range   Glucose-Capillary 166 (*) 70 - 99 mg/dL    Physical Findings: AIMS: Facial and Oral Movements Muscles of Facial Expression: None, normal Lips and Perioral Area: None, normal Jaw: None, normal Tongue: None, normal,Extremity Movements Upper (arms, wrists, hands, fingers): None, normal Lower (legs, knees, ankles, toes): None, normal, Trunk Movements Neck, shoulders, hips: None, normal, Overall Severity Severity of abnormal movements (highest score from questions above): None, normal Incapacitation due to abnormal movements: None, normal Patient's awareness of abnormal movements (rate only patient's report): No Awareness, Dental Status Current problems with teeth and/or dentures?: No Does patient usually wear dentures?: No  CIWA:  CIWA-Ar Total: 1 COWS:  COWS Total Score: 1  Treatment Plan Summary: Daily contact with patient to assess and evaluate symptoms and progress in treatment Medication management  Plan: Treatment Plan/Recommendations:   1. Admit for  crisis management and stabilization. 2. Medication management to reduce current symptoms to base line and improve the patient's overall level of functioning.  Continue current treatment plan and case will be discussed in am during treatment team. 3. Treat health problems as indicated. 4. Develop treatment plan to decrease risk of relapse upon discharge and to reduce the need for readmission. 5. Psycho-social education regarding relapse prevention and self care. 6. Health care follow up as needed for medical problems. 7. Disposition plans are in progress and may discharge on Monday if he continued to show clinical improvement and contracts for safety   Medical Decision Making Problem Points:  Established problem, worsening (2), New problem, with no additional work-up planned (3),  Review of last therapy session (1) and Review of psycho-social stressors (1) Data Points:  Review or order clinical lab tests (1) Review or order medicine tests (1) Review of medication regiment & side effects (2) Review of new medications or change in dosage (2)  I certify that inpatient services furnished can reasonably be expected to improve the patient's condition.   Armani Gawlik,JANARDHAHA R. 09/28/2013, 12:08 PM

## 2013-09-28 NOTE — BHH Group Notes (Signed)
[place] LCSW Group Therapy  Feelings Around Relapse 1:15 -2:30        09/28/2013  3:28 PM   Type of Therapy:  Group Therapy  Participation Level:  Appropriate  Participation Quality:  Appropriate  Affect:  Appropriate  Cognitive:  Attentive Appropriate  Insight:  Developing/Improving  Engagement in Therapy: Developing/Improving  Modes of Intervention:  Discussion Exploration Problem-Solving Supportive  Summary of Progress/Problems:  The topic for today was feelings around relapse.    Patient processed feelings toward relapse and was able to relate to peers. He advised that relapsing for him always begins with isolations.   Patient identified coping skills that can be used to prevent a relapse.   Bryan Mcmillan 09/28/2013 3:28 PM

## 2013-09-28 NOTE — Progress Notes (Signed)
[place] Group Notes:  (Nursing/MHT/Case Management/Adjunct)  Date:  09/28/2013  Time:  8:00 p.m.   Type of Therapy:  Psychoeducational Skills  Participation Level:  Active  Participation Quality:  Appropriate  Affect:  Appropriate  Cognitive:  Appropriate  Insight:  Appropriate  Engagement in Group:  Engaged  Modes of Intervention:  Education  Summary of Progress/Problems: The patient described his day as having been "up and down". He states that for the first time today, he was able to "lay and relax" without sleeping. In terms of the theme of the day, he intends to try and not to wait until until his symptoms become "too critical" or out of control before he speaks up. In addition, he hopes to reach out for help sooner.   Bryan Mcmillan S 09/28/2013, 10:19 PM

## 2013-09-28 NOTE — B[facility] Group Notes (Signed)
[facility] LCSW Aftercare Discharge Planning Group Note   09/28/2013 9:59 AM    Participation Quality:  Appropraite  Mood/Affect:  Appropriate  Depression Rating:  8  Anxiety Rating:  7  Thoughts of Suicide:  No  Will you contract for safety?   NA  Current AVH:  No  Plan for Discharge/Comments:  Patient attended discharge planning group and actively participated in group.  Patient advised of plans to relocate to [place], [place] at discharge.  CSW provided all participants with daily workbook.   Transportation Means: Patient has transportation.   Supports:  Patient has a support system.   Zeke Aker, Eulas Post

## 2013-09-28 NOTE — Progress Notes (Signed)
Patient ID: Bryan Mcmillan, male   DOB: 07-Jul-1971, 43 y.o.   MRN: [id number] D: Patient mood/affect is depressed and flat. Pt report increased anxiety over where he will be living after discharge. Pt endorses passive suicidal ideation but contracts for safety. Pt endorses auditory hallucination without command. Pt denies HI/VH and pain. Pt attended evening wrap up group and Interacted appropriately with peers. Pt denies any needs or concerns.  Cooperative with assessment. No acute distressed noted at this time.   A: Met with pt 1:1. Medications administered as prescribed. Writer encouraged pt to discuss feelings. Writer offered support.  Pt encouraged to come to staff with any question or concerns.   R: Patient remains safe. He is complaint with medications and denies any adverse reaction. Continue current POC.

## 2013-09-29 LAB — GLUCOSE, CAPILLARY
Glucose-Capillary: 185 mg/dL — ABNORMAL HIGH (ref 70–99)
Glucose-Capillary: 300 mg/dL — ABNORMAL HIGH (ref 70–99)

## 2013-09-29 NOTE — Progress Notes (Signed)
[place] Group Notes:  (Nursing/MHT/Case Management/Adjunct)  Date:  09/29/2013  Time:  8:00 p.m.   Type of Therapy:  Psychoeducational Skills  Participation Level:  Active  Participation Quality:  Appropriate  Affect:  Depressed  Cognitive:  Appropriate  Insight:  Improving  Engagement in Group:  Lacking  Modes of Intervention:  Education  Summary of Progress/Problems: The patient shared in group this evening that he had a bad day overall. First of all, he mentioned that he slept for much of the day. Secondly, he stated that he was upset when he realized today that he is "a good person" and that he had difficulty dealing with that revelation. In terms of the theme of the day, his coping skills will include concentrating on his breathing and trying to "think things thru".   Archie Balboa S 09/29/2013, 9:34 PM

## 2013-09-29 NOTE — Progress Notes (Signed)
D) Pt has been attending the groups and interacts with select peers. Rates his depression  At a 7 and his hopelessness at a 6. Denies SI and HI. Continues to have auditory hallucinations that are noises. Pt will attend the groups and then will go back to his room to rest. States he feels tired a lot. Shared information in one of the groups about anger management and how he learned to put a "D" in front it to make the DANGER. A) Pt given support, reassurance and praise. Provided with a 1:1.  R) Denies SI and HI.

## 2013-09-29 NOTE — Progress Notes (Signed)
Patient ID: Bryan Mcmillan, male   DOB: 21-Dec-1970, 43 y.o.   MRN: [id number] [facility] MD Progress Note  09/29/2013 5:11 PM Bryan Mcmillan  MRN:  [id number]  Subjective:  Patient resting in room.  States that he has not been able to sleep feeling extremely anxious and is worried about discharge plans.  Patient wants to return to [place] to live with adult daughter but is not sure if this will be a possibility.  "I just need to figure stuff out"  Endorses significant rumination and racing thoughts.  Patient verbalizes feelings of powerlessness, and feeling isolated.  Rates depression about a 6 today.   Does c/o hearing voices that are self deprecating.  Patient is attending unit groups and programs when prompted.  Patient states minor tooth discomfort and irritation. States broke his tooth yesterday at evening meal.  Patient noted to have cracked tooth on upper left side of mouth.  Denies that wants anything for pain states "it is more annoying than painful"    Diagnosis:   DSM5: Depressive Disorders:  Major Depressive Disorder - Severe (296.23) R/O psychotic features;  Total Time spent with patient: 30 minutes  Axis I: Major Depression, Recurrent severe  ADL's:  Intact  Sleep: Good  Appetite:  Fair  Suicidal Ideation:  Denies suicidal ideation and contract for safety Homicidal Ideation:  denied AEB (as evidenced by):  Psychiatric Specialty Exam: Physical Exam  Review of Systems  Constitutional: Negative.   Eyes: Negative.   Respiratory: Negative.   Cardiovascular: Negative.   Gastrointestinal: Negative.   Genitourinary: Negative.   Musculoskeletal: Negative.   Skin: Negative.   Neurological: Negative.   Endo/Heme/Allergies: Negative.   Psychiatric/Behavioral: Positive for depression and hallucinations.  All other systems reviewed and are negative.    Blood pressure 143/100, pulse 67, temperature 98 F (36.7 C), temperature source Oral, resp. rate 16, height 5\' 10"   (1.778 m), weight 126.1 kg (278 lb), SpO2 94.00%.Body mass index is 39.89 kg/(m^2).  General Appearance: Disheveled and Guarded  Eye Sport and exercise psychologist::  Fair  Speech:  Clear and Coherent  Normal rate rhythm and parosody   Volume:  Decreased  Mood:  Anxious, Depressed, Hopeless and Worthless  Affect:  Constricted and Depressed  Thought Process:  Goal Directed and Intact  Orientation:  Full (Time, Place, and Person)  Thought Content:  Rumination  States having racing thoughts   Suicidal Thoughts:  Yes.  without intent/plan  Homicidal Thoughts:  No  Memory:  Immediate;   Fair  Judgement:  Fair  Insight:  Lacking  Psychomotor Activity:  Psychomotor Retardation and Restlessness  Concentration:  Fair   Recall:  Good  Fund of Knowledge: fair   Language: Good  Akathisia:  NA  Handed:  Right  AIMS (if indicated):     Assets:  Communication Skills Desire for Improvement Physical Health Resilience Social Support  Sleep:  Number of Hours: 6.5   Musculoskeletal: Strength & Muscle Tone: within normal limits Gait & Station: normal Patient leans: N/A  Current Medications: Current Facility-Administered Medications  Medication Dose Route Frequency Provider Last Rate Last Dose  . acetaminophen (TYLENOL) tablet 650 mg  650 mg Oral Q6H PRN Waylan Boga, NP      . atorvastatin (LIPITOR) tablet 40 mg  40 mg Oral q1800 Waylan Boga, NP   40 mg at 09/28/13 1703  . doxepin (SINEQUAN) capsule 25 mg  25 mg Oral QHS PRN,MR X 1 Benjamine Mola, FNP   25 mg at 09/28/13 2208  . enoxaparin (LOVENOX)  injection 130 mg  130 mg Subcutaneous Q12H Waylan Boga, NP   130 mg at 09/29/13 0842  . gabapentin (NEURONTIN) capsule 300 mg  300 mg Oral TID Waylan Boga, NP   300 mg at 09/29/13 1309  . glyBURIDE (DIABETA) tablet 5 mg  5 mg Oral Q breakfast Waylan Boga, NP   5 mg at 09/29/13 6644  . hydrOXYzine (ATARAX/VISTARIL) tablet 25 mg  25 mg Oral Q6H PRN Benjamine Mola, FNP   25 mg at 09/28/13 2211  . ibuprofen  (ADVIL,MOTRIN) tablet 800 mg  800 mg Oral Q8H PRN Waylan Boga, NP      . insulin glargine (LANTUS) injection 40 Units  40 Units Subcutaneous QHS Waylan Boga, NP   40 Units at 09/28/13 2208  . levETIRAcetam (KEPPRA) tablet 500 mg  500 mg Oral BID Waylan Boga, NP   500 mg at 09/29/13 0347  . magnesium hydroxide (MILK OF MAGNESIA) suspension 30 mL  30 mL Oral Daily PRN Waylan Boga, NP      . metFORMIN (GLUCOPHAGE) tablet 1,000 mg  1,000 mg Oral BID WC Waylan Boga, NP   1,000 mg at 09/29/13 0842  . ondansetron (ZOFRAN) tablet 4 mg  4 mg Oral Q8H PRN Waylan Boga, NP   4 mg at 09/25/13 1447  . Vilazodone HCl (VIIBRYD) TABS 40 mg  40 mg Oral Daily Waylan Boga, NP   40 mg at 09/29/13 4259    Lab Results:  Results for orders placed during the hospital encounter of 09/24/13 (from the past 48 hour(s))  GLUCOSE, CAPILLARY     Status: Abnormal   Collection Time    09/27/13  9:08 PM      Result Value Ref Range   Glucose-Capillary 209 (*) 70 - 99 mg/dL   Comment 1 Notify RN    GLUCOSE, CAPILLARY     Status: Abnormal   Collection Time    09/28/13  6:16 AM      Result Value Ref Range   Glucose-Capillary 166 (*) 70 - 99 mg/dL  GLUCOSE, CAPILLARY     Status: Abnormal   Collection Time    09/28/13  4:57 PM      Result Value Ref Range   Glucose-Capillary 195 (*) 70 - 99 mg/dL   Comment 1 Notify RN    GLUCOSE, CAPILLARY     Status: Abnormal   Collection Time    09/28/13  8:55 PM      Result Value Ref Range   Glucose-Capillary 219 (*) 70 - 99 mg/dL   Comment 1 Notify RN    GLUCOSE, CAPILLARY     Status: Abnormal   Collection Time    09/29/13  5:43 AM      Result Value Ref Range   Glucose-Capillary 185 (*) 70 - 99 mg/dL   Comment 1 Notify RN      Physical Findings: AIMS: Facial and Oral Movements Muscles of Facial Expression: None, normal Lips and Perioral Area: None, normal Jaw: None, normal Tongue: None, normal,Extremity Movements Upper (arms, wrists, hands, fingers): None,  normal Lower (legs, knees, ankles, toes): None, normal, Trunk Movements Neck, shoulders, hips: None, normal, Overall Severity Severity of abnormal movements (highest score from questions above): None, normal Incapacitation due to abnormal movements: None, normal Patient's awareness of abnormal movements (rate only patient's report): No Awareness, Dental Status Current problems with teeth and/or dentures?: No Does patient usually wear dentures?: No  CIWA:  CIWA-Ar Total: 1 COWS:  COWS Total Score: 1  Treatment Plan  Summary: Daily contact with patient to assess and evaluate symptoms and progress in treatment Medication management  Continue with current treatment plan, no changes made to medication regimen   Plan: Treatment Plan/Recommendations:   1. Admit for crisis management and stabilization. 2. Medication management to reduce current symptoms to base line and improve the patient's overall level of functioning.  Continue current treatment plan and case will be discussed in am during treatment team. 3. Treat health problems as indicated. 4. Develop treatment plan to decrease risk of relapse upon discharge and to reduce the need for readmission. 5. Psycho-social education regarding relapse prevention and self care. 6. Health care follow up as needed for medical problems. 7. Disposition plans are in progress and may discharge on Monday if he continued to show clinical improvement and contracts for safety   Medical Decision Making Problem Points:  Established problem, worsening (2), New problem, with no additional work-up planned (3), Review of last therapy session (1) and Review of psycho-social stressors (1) Data Points:  Review or order clinical lab tests (1) Review or order medicine tests (1) Review of medication regiment & side effects (2) Review of new medications or change in dosage (2)  I certify that inpatient services furnished can reasonably be expected to improve the  patient's condition.   Waylan Boga, [place] 09/29/2013, 5:11 PM

## 2013-09-29 NOTE — BHH Group Notes (Signed)
[place] Group Notes:  (Clinical Social Work)  09/29/2013   1:15-2:15PM  Summary of Progress/Problems:   The main focus of today's process group was for the patient to identify ways in which they have sabotaged their own mental health wellness/recovery.  Motivational interviewing and a handout were used to explore the benefits and costs of their self-sabotaging behavior as well as the benefits and costs of changing this behavior.  The Stages of Change were explained to the group using a handout, and it was explained to patients the importance of making specific plans for how to handle self-sabotaging behaviors. The patient expressed he self-sabotages with isolation and rumination, which interact with each other in a spiraling fashion.  The benefit of changing this self-defeating behavior would be for him to feel he is actually accomplishing something and to stop this cycle.  Type of Therapy:  Process Group  Participation Level:  Active  Participation Quality:  Attentive and Sharing  Affect:  Depressed and Flat  Cognitive:  Appropriate and Oriented  Insight:  Developing/Improving  Engagement in Therapy:  Engaged  Modes of Intervention:  Education, Motivational Interviewing   Selmer Dominion, LCSW 09/29/2013, 4:00pm

## 2013-09-30 LAB — GLUCOSE, CAPILLARY
GLUCOSE-CAPILLARY: 193 mg/dL — AB (ref 70–99)
Glucose-Capillary: 245 mg/dL — ABNORMAL HIGH (ref 70–99)

## 2013-09-30 NOTE — Progress Notes (Signed)
Patient ID: Bryan Mcmillan, male   DOB: 16-Mar-1971, 43 y.o.   MRN: [id number] [facility] MD Progress Note  09/30/2013 2:42 PM Bryan Mcmillan  MRN:  [id number]  Subjective:  Bryan Mcmillan reports dizziness upon standing.  States that this happens when rising too quickly from sitting position, or upon awakening in the am.  Encouraged to sit for a minute to avoid orthostatic hypotension when changing positions.  Endorses continued feelings of depression rates depression stating "I have a lot of negative self talk"  "today I am trying to combat each negative with a positive comment"  Bryan Mcmillan has attended all unit groups and programs.  He is well groomed in casual attire.  Pleasant upon approach.  No changes made to medication regimen states that using Ibuprofen for headaches.    Diagnosis:   DSM5: Depressive Disorders:  Major Depressive Disorder - Severe (296.23) R/O psychotic features;  Total Time spent with patient: 20 minutes  ADL's:  Intact  Groomed casually attired   Sleep: Poor  Appetite:  Fair  Suicidal Ideation:  Denies suicidal ideation l/plan Homicidal Ideation:  denied AEB (as evidenced by):  Psychiatric Specialty Exam: Physical Exam  Vitals reviewed. Constitutional: He is oriented to person, place, and time. He appears well-developed and well-nourished.  HENT:  Head: Normocephalic and atraumatic.  Musculoskeletal: Normal range of motion.  Neurological: He is alert and oriented to person, place, and time.  Skin: Skin is warm and dry.    Review of Systems  Constitutional: Negative.   Eyes: Negative.   Respiratory: Negative.   Cardiovascular: Negative.   Gastrointestinal: Negative.   Genitourinary: Negative.   Musculoskeletal: Negative.   Skin: Negative.   Neurological: Positive for dizziness and headaches. Negative for tremors.  Endo/Heme/Allergies: Negative.   Psychiatric/Behavioral: Positive for depression and hallucinations. The patient has insomnia.   All other systems  reviewed and are negative.    Blood pressure 117/70, pulse 94, temperature 98.3 F (36.8 C), temperature source Oral, resp. rate 16, height 5\' 10"  (1.778 m), weight 126.1 kg (278 lb), SpO2 94.00%.Body mass index is 39.89 kg/(m^2).  General Appearance: Disheveled and Guarded  Eye Sport and exercise psychologist::  Fair  Speech:  Clear and Coherent  Normal rate rhythm and parosody   Volume:  Decreased  Mood:  Anxious, Depressed, Hopeless and Worthless  Affect:  Constricted and Depressed  Thought Process:  Goal Directed and Intact  Orientation:  Full (Time, Place, and Person)  Thought Content:  Rumination  States having racing thoughts   Suicidal Thoughts:  Yes.  without intent/plan  Homicidal Thoughts:  No  Memory:  Immediate;   Fair  Judgement:  Fair  Insight:  Lacking  Psychomotor Activity:  Psychomotor retardation   Concentration:  Fair   Recall:  Good  Fund of Knowledge: fair   Language: Good  Akathisia:  NA  Handed:  Right  AIMS (if indicated):     Assets:  Communication Skills Desire for Improvement Physical Health Resilience Social Support  Sleep:  Number of Hours: 6.25   Musculoskeletal: Strength & Muscle Tone: within normal limits Gait & Station: normal Patient leans: N/A  Current Medications: Current Facility-Administered Medications  Medication Dose Route Frequency Provider Last Rate Last Dose  . acetaminophen (TYLENOL) tablet 650 mg  650 mg Oral Q6H PRN Waylan Boga, NP      . atorvastatin (LIPITOR) tablet 40 mg  40 mg Oral q1800 Waylan Boga, NP   40 mg at 09/29/13 1816  . doxepin (SINEQUAN) capsule 25 mg  25 mg Oral QHS  PRN,MR X 1 Benjamine Mola, FNP   25 mg at 09/28/13 2208  . enoxaparin (LOVENOX) injection 130 mg  130 mg Subcutaneous Q12H Waylan Boga, NP   130 mg at 09/30/13 8469  . gabapentin (NEURONTIN) capsule 300 mg  300 mg Oral TID Waylan Boga, NP   300 mg at 09/30/13 1157  . glyBURIDE (DIABETA) tablet 5 mg  5 mg Oral Q breakfast Waylan Boga, NP   5 mg at 09/30/13 6295   . hydrOXYzine (ATARAX/VISTARIL) tablet 25 mg  25 mg Oral Q6H PRN Benjamine Mola, FNP   25 mg at 09/29/13 2120  . ibuprofen (ADVIL,MOTRIN) tablet 800 mg  800 mg Oral Q8H PRN Waylan Boga, NP   800 mg at 09/29/13 2006  . insulin glargine (LANTUS) injection 40 Units  40 Units Subcutaneous QHS Waylan Boga, NP   40 Units at 09/29/13 2120  . levETIRAcetam (KEPPRA) tablet 500 mg  500 mg Oral BID Waylan Boga, NP   500 mg at 09/30/13 2841  . magnesium hydroxide (MILK OF MAGNESIA) suspension 30 mL  30 mL Oral Daily PRN Waylan Boga, NP      . metFORMIN (GLUCOPHAGE) tablet 1,000 mg  1,000 mg Oral BID WC Waylan Boga, NP   1,000 mg at 09/30/13 3244  . ondansetron (ZOFRAN) tablet 4 mg  4 mg Oral Q8H PRN Waylan Boga, NP   4 mg at 09/25/13 1447  . Vilazodone HCl (VIIBRYD) TABS 40 mg  40 mg Oral Daily Waylan Boga, NP   40 mg at 09/30/13 0102    Lab Results:  Results for orders placed during the hospital encounter of 09/24/13 (from the past 48 hour(s))  GLUCOSE, CAPILLARY     Status: Abnormal   Collection Time    09/28/13  4:57 PM      Result Value Ref Range   Glucose-Capillary 195 (*) 70 - 99 mg/dL   Comment 1 Notify RN    GLUCOSE, CAPILLARY     Status: Abnormal   Collection Time    09/28/13  8:55 PM      Result Value Ref Range   Glucose-Capillary 219 (*) 70 - 99 mg/dL   Comment 1 Notify RN    GLUCOSE, CAPILLARY     Status: Abnormal   Collection Time    09/29/13  5:43 AM      Result Value Ref Range   Glucose-Capillary 185 (*) 70 - 99 mg/dL   Comment 1 Notify RN    GLUCOSE, CAPILLARY     Status: Abnormal   Collection Time    09/29/13  8:39 PM      Result Value Ref Range   Glucose-Capillary 300 (*) 70 - 99 mg/dL   Comment 1 Notify RN    GLUCOSE, CAPILLARY     Status: Abnormal   Collection Time    09/30/13  6:03 AM      Result Value Ref Range   Glucose-Capillary 193 (*) 70 - 99 mg/dL   Comment 1 Notify RN     Comment 2 Documented in Chart      Physical Findings: AIMS: Facial and Oral  Movements Muscles of Facial Expression: None, normal Lips and Perioral Area: None, normal Jaw: None, normal Tongue: None, normal,Extremity Movements Upper (arms, wrists, hands, fingers): None, normal Lower (legs, knees, ankles, toes): None, normal, Trunk Movements Neck, shoulders, hips: None, normal, Overall Severity Severity of abnormal movements (highest score from questions above): None, normal Incapacitation due to abnormal movements: None, normal Patient's awareness of  abnormal movements (rate only patient's report): No Awareness, Dental Status Current problems with teeth and/or dentures?: No Does patient usually wear dentures?: No   Treatment Plan Summary: Daily contact with patient to assess and evaluate symptoms and progress in treatment Medication management  Continue with current treatment plan, no changes made to medication regimen   Plan: Treatment Plan/Recommendations:   1. Admit for crisis management and stabilization. 2. Medication management to reduce current symptoms to base line and improve the patient's overall level of functioning.  Continue current treatment plan and case will be discussed in am during treatment team. 3. Treat health problems as indicated. 4. Develop treatment plan to decrease risk of relapse upon discharge and to reduce the need for readmission. 5. Psycho-social education regarding relapse prevention and self care. 6. Health care follow up as needed for medical problems. 7. Disposition plans are in progress and may discharge on Monday if he continued to show clinical improvement and contracts for safety   Medical Decision Making Problem Points:  Established problem, worsening (2) and Review of psycho-social stressors (1) Data Points:  Review or order clinical lab tests (1) Review or order medicine tests (1) Review of medication regiment & side effects (2) Review of new medications or change in dosage (2)  I certify that inpatient services  furnished can reasonably be expected to improve the patient's condition.   Waylan Boga, [place] 09/30/2013, 2:42 PM

## 2013-09-30 NOTE — Progress Notes (Signed)
Psychoeducational Group Note  Date: 09/30/2013 Time:  0930  Group Topic/Focus:  Gratefulness:  The focus of this group is to help patients identify what two things they are most grateful for in their lives. What helps ground them and to center them on their work to their recovery.  Participation Level:  Active  Participation Quality:  Appropriate  Affect:  Appropriate  Cognitive:  Oriented  Insight:  improving  Engagement in Group:  Engaged  Additional Comments:    Abrar Koone A   

## 2013-09-30 NOTE — Progress Notes (Signed)
[place] Group Notes:  (Nursing/MHT/Case Management/Adjunct)  Date:  09/30/2013  Time:  8:00 p.m.   Type of Therapy:  Psychoeducational Skills  Participation Level:  Minimal  Participation Quality:  Resistant  Affect:  Depressed  Cognitive:  Lacking  Insight:  Lacking  Engagement in Group:  Resistant  Modes of Intervention:  Education  Summary of Progress/Problems: The patient expressed in group that he felt the same as he did yesterday. He stated that he felt "mellow"and was trying to "think things thru". As a theme for the day, his support system will be his ex-wife.   Bryan Mcmillan S 09/30/2013, 10:15 PM

## 2013-09-30 NOTE — Progress Notes (Signed)
Psychoeducational Group Note  Date: 09/30/2013 Time:  0930  Group Topic/Focus:  Gratefulness:  The focus of this group is to help patients identify what two things they are most grateful for in their lives. What helps ground them and to center them on their work to their recovery.  Participation Level:  Active  Participation Quality:  Appropriate  Affect:  Appropriate  Cognitive:  Oriented  Insight:  improving  Engagement in Group:  Engaged  Additional Comments:    Josejulian Tarango A   

## 2013-09-30 NOTE — Progress Notes (Signed)
Bryan Mcmillan is seen today...flat, sad and depressed. HE attends his groups as scheduled and he completes his self inventory and on it he writes  He denies SI within the past 24 hrs, he rates his depression and hopelessness "7/4", respectively and he says he's not sure about his DC plan.    A HE demonstrates insight into his unhealthy behaviors and shares these thoughts with his group this morning in [employer] and this nurse praises hiim afterwards.    R Safety is in place and poc moves forward.

## 2013-09-30 NOTE — BHH Group Notes (Signed)
[place] Group Notes:  (Clinical Social Work)  09/30/2013   1:15-2:15PM  Summary of Progress/Problems:  The main focus of today's process group was to   identify the patient's current support system and decide on other supports that can be put in place.  The picture on workbook was used to discuss why additional supports are needed.  An emphasis was placed on using counselor, doctor, therapy groups, 12-step groups, and problem-specific support groups to expand supports.   There was also an extensive discussion about what constitutes a healthy support versus an unhealthy support.  The patient expressed full comprehension of the concepts presented, and agreed that there is a need to add more supports.  He sat with his eyes closed for much of group, but did give feedback to another patient and demonstrated in this that he was following the discussion closely.  Type of Therapy:  Process Group  Participation Level:  Active  Participation Quality:  Attentive and Supportive  Affect:  Blunted and Depressed and Anxious  Cognitive:  Appropriate and Oriented  Insight:  Engaged  Engagement in Therapy:  Engaged  Modes of Intervention:  Education,  Support and [employer], LCSW 09/30/2013, 4:00pm

## 2013-09-30 NOTE — Progress Notes (Signed)
Writer has observed patient up in the dayroom watching tv with minimal interaction with peers. He attended group and participated, he is compliant with his medications. Patient reports that he is concerned with where he will be staying once discharged from here. Writer encouraged him to speak with his social worker to see what options he has available housing in the area. Patient currently denies si/hi/visual hallucinations, he reports that he has negative thoughts and voices but has been using positive coping skills with thinking more positive. Writer praised patient for more positive thinking and encouraged him to continue. Safety maintained on unit with 15 min checks.

## 2013-09-30 NOTE — Progress Notes (Signed)
Late Entry for 09-29-13 Entered on 09-30-13  .Psychoeducational Group Note    Date: 09/30/2013 Time: 0930   Goal Setting Purpose of Group: To be able to set Mcmillan goal that is measurable and that can be accomplished in one day Participation Level:  Active  Participation Quality:  Appropriate  Affect:  Appropriate  Cognitive:  Oriented  Insight:  Improving  Engagement in Group:  Engaged  Additional Comments:    Bryan Mcmillan 

## 2013-09-30 NOTE — Progress Notes (Signed)
Writer has observed patient sitting in the dayroom watching tv and interacting appropriately with select peers. He c/o not being able to sleep the previous night. Writer spoke with him about his cbg at 300 and he reported that he ate about 3 chocolate chip cookies at dinner. He reports that he knew he was messing up but couldn't resist eating them. He plan to move to [place] to be with his daughter. He is compliant with his medications and is hopeful to sleep better tonight. Safety maintained on unit with 15 min checks.

## 2013-09-30 NOTE — Progress Notes (Signed)
Late entry for 09-29-13 Entered on 09-30-13  Psychoeducational Group Note  Date: 09/30/2013 Time:  1015  Group Topic/Focus:  Identifying Needs:   The focus of this group is to help patients identify their personal needs that have been historically problematic and identify healthy behaviors to address their needs.  Participation Level:  Active  Participation Quality:  Appropriate  Affect:  Appropriate  Cognitive:  Oriented  Insight:  Improving  Engagement in Group:  Engaged  Additional Comments:    Littie Chiem A  

## 2013-10-01 LAB — GLUCOSE, CAPILLARY: Glucose-Capillary: 172 mg/dL — ABNORMAL HIGH (ref 70–99)

## 2013-10-01 MED ORDER — VILAZODONE HCL 40 MG PO TABS: 40.0000 mg | ORAL_TABLET | Freq: Every day | ORAL | Status: DC

## 2013-10-01 MED ORDER — LEVETIRACETAM 500 MG PO TABS: 500.0000 mg | ORAL_TABLET | Freq: Two times a day (BID) | ORAL | Status: DC

## 2013-10-01 MED ORDER — GABAPENTIN 300 MG PO CAPS: 300.0000 mg | ORAL_CAPSULE | Freq: Three times a day (TID) | ORAL | Status: DC

## 2013-10-01 NOTE — Progress Notes (Signed)
[facility] Adult Case Management Discharge Plan :  Will you be returning to the same living situation after discharge: No. Patient is planning to relocate to [place], [place]. At discharge, do you have transportation home?:Yes,  Patient assisted with bus pass Do you have the ability to pay for your medications:No.  Patient needs assistance with indigent medications   Release of information consent forms completed and in the chart;  Patient's signature needed at discharge.  Patient to Follow up at: Follow-up Information   Follow up with [facility]  On 10/10/2013. (You are scheduled with Beverly Sessions on Wednesday, October 10, 2013 at 12:40  PM)    Contact information:   [address] [place], [place]  [phone]      Follow up with [facility]. (Please contact [facility] for followup with medications/counseling upon arrival in [place])    Contact information:   [address] [place]  [address]   [phone]      Patient denies SI/HI:  Patient no longer endorsing SI/HI or other thoughts of self harm.   Safety Planning and Suicide Prevention discussed: .Reviewed with all patients during discharge planning group  Artha Stavros, Eulas Post 10/01/2013, 12:17 PM

## 2013-10-01 NOTE — Progress Notes (Signed)
Patient ID: Bryan Mcmillan, male   DOB: 1971-06-29, 43 y.o.   MRN: [id number] Patient denies HI/SI and A/V hallucinations. Verbalized understanding of medications and how they are to be taken.  Discharged and to be picked up by brother.Will follow up with psychiatrist in [place].  Plans to eventually move to [facility] and will seek services there.
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
## 2013-10-01 NOTE — Tx Team (Signed)
Interdisciplinary Treatment Plan Update   Date Reviewed:  10/01/2013  Time Reviewed:  10:16 AM  Progress in Treatment:   Attending groups: Yes Participating in groups: Yes Taking medication as prescribed: Yes  Tolerating medication: Yes Family/Significant other contact made:  Patient advised of no collateral contact Patient understands diagnosis: Yes  Discussing patient identified problems/goals with staff: Yes Medical problems stabilized or resolved: Yes Denies suicidal/homicidal ideation: Yes Patient has not harmed self or others: Yes  For review of initial/current patient goals, please see plan of care.  Estimated Length of Stay:  Discharge today  Reasons for Continued Hospitalization:   New Problems/Goals identified:    Discharge Plan or Barriers:   Home with outpatient follow up with [facility] while waiting to relocate to [place]  Additional Comments:    Attendees:  Patient:  10/01/2013 10:16 AM   Signature: Mylinda Latina, MD 10/01/2013 10:16 AM  Signature:   10/01/2013 10:16 AM  Signature:  Catalina Pizza, NP 10/01/2013 10:16 AM  Signature:  Grayland Ormond, RN 10/01/2013 10:16 AM  Signature:   10/01/2013 10:16 AM  Signature:  Joette Catching, LCSW 10/01/2013 10:16 AM  Signature:  Regan Lemming, LCSW 10/01/2013 10:16 AM  Signature:   Thurnell Garbe, RN 10/01/2013 10:16 AM  Signature:  Candida Peeling, LCSW 10/01/2013 10:16 AM  Signature: Eben Burow, RN 10/01/2013  10:16 AM  Signature:   Lars Pinks, RN [facility] 10/01/2013  10:16 AM  Signature:  Hinda Lenis, RN 10/01/2013  10:16 AM    Scribe for Treatment Team:   Joette Catching,  10/01/2013 10:16 AM

## 2013-10-01 NOTE — [facility]HH Suicide Risk Assessment (Signed)
Demographic Factors:  Male, Adolescent or young adult, Caucasian, Low socioeconomic status and Unemployed  Total Time spent with patient: 30 minutes  Psychiatric Specialty Exam: Physical Exam  Constitutional: He is oriented to person, place, and time. He appears well-developed.  HENT:  Head: Normocephalic.  Eyes: Pupils are equal, round, and reactive to light.  Neck: Normal range of motion.  Cardiovascular: Normal rate.   Respiratory: Effort normal.  GI: Soft.  Musculoskeletal: Normal range of motion.  Neurological: He is alert and oriented to person, place, and time.  Skin: Skin is warm.    Review of Systems  Psychiatric/Behavioral: Positive for depression.  All other systems reviewed and are negative.    Blood pressure 138/82, pulse 101, temperature 98.3 F (36.8 C), temperature source Oral, resp. rate 18, height 5\' 10"  (1.778 m), weight 126.1 kg (278 lb), SpO2 94.00%.Body mass index is 39.89 kg/(m^2).  General Appearance: Disheveled and Guarded  Engineer, water::  Fair  Speech:  Clear and Coherent and Slow  Volume:  Decreased  Mood:  Anxious and Depressed  Affect:  Depressed and Flat  Thought Process:  Goal Directed and Intact  Orientation:  Full (Time, Place, and Person)  Thought Content:  Rumination  Suicidal Thoughts:  No  Homicidal Thoughts:  No  Memory:  Immediate;   Fair  Judgement:  Fair  Insight:  Fair  Psychomotor Activity:  Psychomotor Retardation  Concentration:  Fair  Recall:  [place] of Knowledge:Good  Language: Good  Akathisia:  NA  Handed:  Right  AIMS (if indicated):     Assets:  Communication Skills Desire for Improvement Leisure Time Physical Health Resilience  Sleep:  Number of Hours: 6.5    Musculoskeletal: Strength & Muscle Tone: within normal limits Gait & Station: normal Patient leans: N/A   Mental Status Per Nursing Assessment::   On Admission:  Suicidal ideation indicated by patient  Current Mental Status by  Physician: NA  Loss Factors: Decrease in vocational status and Financial problems/change in socioeconomic status  Historical Factors: Impulsivity  Risk Reduction Factors:   Sense of responsibility to family, Religious beliefs about death and Positive coping skills or problem solving skills  Continued Clinical Symptoms:  Depression:   Insomnia Recent sense of peace/wellbeing Unstable or Poor Therapeutic Relationship Previous Psychiatric Diagnoses and Treatments  Cognitive Features That Contribute To Risk:  Polarized thinking    Suicide Risk:  Minimal: No identifiable suicidal ideation.  Patients presenting with no risk factors but with morbid ruminations; may be classified as minimal risk based on the severity of the depressive symptoms  Discharge Diagnoses:   AXIS I:  Major Depression, Recurrent severe AXIS II:  Personality Disorder NOS AXIS III:   Past Medical History  Diagnosis Date  . Asthma   . Depression   . Diabetes   . Headache(784.0)     frequent  . Migraines   . History of blood clots   . Hyperlipidemia   . Obesity   . Sleep apnea   . DVT (deep venous thrombosis), hx of recurrent 05/23/2012  . History of blood clot in brain, 2012 - followed by [facility] 05/23/2012  . Seizure disorder - followed by [facility] 05/23/2012  . Hyperlipemia 08/25/2012  . Anxiety   . Tuberculosis   . Pneumonia   . Kidney disease   . Kidney stones   . Seizure    AXIS IV:  economic problems, housing problems, occupational problems, other psychosocial or environmental problems, problems related to social environment  and problems with primary support group AXIS V:  51-60 moderate symptoms  Plan Of Care/Follow-up recommendations:  Activity:  As tolerated Diet:  Regular  Is patient on multiple antipsychotic therapies at discharge:  No   Has Patient had three or more failed trials of antipsychotic monotherapy by history:  No  Recommended Plan for Multiple Antipsychotic  Therapies: NA    Onna Nodal,JANARDHAHA R. 10/01/2013, 12:51 PM

## 2013-10-01 NOTE — B[facility] Group Notes (Signed)
[facility] LCSW Aftercare Discharge Planning Group Note   10/01/2013 11:04 AM    Participation Quality:  [place]ropraite  Mood/Affect:  Appropriate  Depression Rating:  6-7  Anxiety Rating:  7  Thoughts of Suicide:  No  Will you contract for safety?   NA  Current AVH:  No  Plan for Discharge/Comments:  Patient attended discharge planning group and actively participated in group.  Patient uncertain when he will be leaving for [place].  He will be given contact information for outpatient services in [place].   CSW provided all participants with daily workbook.   Transportation Means: Patient has transportation.   Supports:  Patient has a support system.   Honestii Marton, Eulas Post

## 2013-10-04 NOTE — Progress Notes (Signed)
Patient Discharge Instructions:  After Visit Summary (AVS):   Faxed to:  10/04/13 Discharge Summary Note:   Faxed to:  10/04/13 Psychiatric Admission Assessment Note:   Faxed to:  10/04/13 Suicide Risk Assessment - Discharge Assessment:   Faxed to:  10/04/13 Faxed/Sent to the Next Level Care provider:  10/04/13 Faxed to [facility] @ [phone] Faxed to [facility] @ [place], 10/04/2013, 3:17 PM

## 2013-10-10 ENCOUNTER — Ambulatory Visit: Payer: Self-pay | Admitting: Endocrinology

## 2013-10-17 ENCOUNTER — Ambulatory Visit (INDEPENDENT_AMBULATORY_CARE_PROVIDER_SITE_OTHER): Payer: No Typology Code available for payment source | Admitting: Endocrinology

## 2013-10-17 ENCOUNTER — Encounter: Payer: Self-pay | Admitting: Endocrinology

## 2013-10-17 VITALS — BP 118/70 | HR 86 | Temp 98.2°F | Ht 70.0 in | Wt 275.0 lb

## 2013-10-17 DIAGNOSIS — IMO0001 Reserved for inherently not codable concepts without codable children: Secondary | ICD-10-CM

## 2013-10-17 DIAGNOSIS — E1165 Type 2 diabetes mellitus with hyperglycemia: Secondary | ICD-10-CM

## 2013-10-17 DIAGNOSIS — IMO0002 Reserved for concepts with insufficient information to code with codable children: Secondary | ICD-10-CM

## 2013-10-17 MED ORDER — BROMOCRIPTINE MESYLATE 2.5 MG PO TABS: 1.2500 mg | ORAL_TABLET | Freq: Every day | ORAL | Status: DC

## 2013-10-17 NOTE — Patient Instructions (Addendum)
good diet and exercise habits significanly improve the control of your diabetes.  please let me know if you wish to be referred to a dietician.  high blood sugar is very risky to your health.  you should see an eye doctor every year.  You are at higher than average risk for pneumonia and hepatitis-B.  You should be vaccinated against both.   controlling your blood pressure and cholesterol drastically reduces the damage diabetes does to your body.  this also applies to quitting smoking.  please discuss these with your doctor.  check your blood sugar twice a day.  vary the time of day when you check, between before the 3 meals, and at bedtime.  also check if you have symptoms of your blood sugar being too high or too low.  please keep a record of the readings and bring it to your next appointment here.  You can write it on any piece of paper.  please call us sooner if your blood sugar goes below 70, or if you have a lot of readings over 200. You can stay-off the insulin for now. Please add "bromocriptine," to help your blood sugar. It has possible side effects of nausea and dizziness.  These go away with time.  You can avoid these by taking it at bedtime.   Please come back for a follow-up appointment in 1-2 weeks

## 2013-10-17 NOTE — Progress Notes (Signed)
Subjective:    Patient ID: Bryan Mcmillan, male    DOB: Mar 16, 1971, 43 y.o.   MRN: [id number]  HPI pt states DM was dx'ed in 2012, when he underwent brain surgery; he was on steroids at the time, but it persisted after discontinuation of the steroids; he has mild neuropathy of the lower extremities; he is unaware of any associated chronic complications.  he has been on insulin since dx.  pt says his diet and exercise are much better recently.  He has frequent mild hypoglycemia, usually in the afternoon.  He has never had DKA or severe hypoglycemia.  He says cbg is also sometimes as high as 600.  Pt says he has taken no insulin x 1 week.  On glyburide and metformin alone, he says cbg's have been in the high-100's.  He wants to know if this can be managed without insulin, as he has trouble remembering it.   Past Medical History  Diagnosis Date  . Asthma   . Depression   . Diabetes   . Headache(784.0)     frequent  . Migraines   . History of blood clots   . Hyperlipidemia   . Obesity   . Sleep apnea   . DVT (deep venous thrombosis), hx of recurrent 05/23/2012  . History of blood clot in brain, 2012 - followed by [facility] 05/23/2012  . Seizure disorder - followed by [facility] 05/23/2012  . Hyperlipemia 08/25/2012  . Anxiety   . Tuberculosis   . Pneumonia   . Kidney disease   . Kidney stones   . Seizure     Past Surgical History  Procedure Laterality Date  . Brain surgery    . Filter for blood clots      History   Social History  . Marital Status: Single    Spouse Name: N/A    Number of Children: 1  . Years of Education: BA   Occupational History  . CUST SERV [employer]   Social History Main Topics  . Smoking status: Former Research scientist (life sciences)  . Smokeless tobacco: Not on file  . Alcohol Use: No  . Drug Use: No  . Sexual Activity: No   Other Topics Concern  . Not on file   Social History Narrative   Work or School: Back of [employer] Situation:  lives with sister and mother      Spiritual Beliefs:      Lifestyle: trying to walk and working on diet      Caffeine Use: does consume             Current Outpatient Prescriptions on File Prior to Visit  Medication Sig Dispense Refill  . enoxaparin (LOVENOX) 150 MG/ML injection Inject 1 mL (150 mg total) into the skin every 12 (twelve) hours. For blood clots  180 Syringe  1  . gabapentin (NEURONTIN) 300 MG capsule Take 1 capsule (300 mg total) by mouth 3 (three) times daily.  90 capsule  0  . glyBURIDE (DIABETA) 5 MG tablet Take 1 tablet (5 mg total) by mouth daily with breakfast.  30 tablet  3  . insulin glargine (LANTUS) 100 UNIT/ML injection Inject 0.4 mLs (40 Units total) into the skin at bedtime. For diabetes management  10 mL  12  . levETIRAcetam (KEPPRA) 500 MG tablet Take 1 tablet (500 mg total) by mouth 2 (two) times daily.  60 tablet  0  . metFORMIN (GLUCOPHAGE) 1000 MG tablet Take  1 tablet (1,000 mg total) by mouth 2 (two) times daily with a meal. For diabetes management  60 tablet  3  . simvastatin (ZOCOR) 80 MG tablet Take 1 tablet (80 mg total) by mouth daily.  30 tablet  2  . Vilazodone HCl (VIIBRYD) 40 MG TABS Take 1 tablet (40 mg total) by mouth daily.  30 tablet  0   No current facility-administered medications on file prior to visit.    Allergies  Allergen Reactions  . Penicillins Other (See Comments)    "childhood reaction"    Family History  Problem Relation Age of Onset  . Hyperlipidemia      parent  . Heart disease      parent  . Stroke      parent  . Diabetes      grandparent /parent  . Obesity Other   . Heart attack Other   DM: mother  BP 118/70  Pulse 86  Temp(Src) 98.2 F (36.8 C) (Oral)  Ht 5\' 10"  (1.778 m)  Wt 275 lb (124.739 kg)  BMI 39.46 kg/m2  SpO2 95%    Review of Systems denies chest pain, sob, n/v, cramps, excessive diaphoresis, cold intolerance, and easy bruising.  He has visual scotoma, memory loss, urinary frequency,  rhinorrhea, and headache.  He has lost a few lbs, due to his efforts.  He says depression is pretty well-controlled.    Objective:   Physical Exam VS: see vs page GEN: no distress HEAD: head: no deformity eyes: no periorbital swelling, no proptosis external nose and ears are normal mouth: no lesion seen NECK: supple, thyroid is not enlarged CHEST WALL: no deformity LUNGS: clear to auscultation BREASTS:  No gynecomastia CV: reg rate and rhythm, no murmur ABD: abdomen is soft, nontender.  no hepatosplenomegaly.  not distended.  no hernia MUSCULOSKELETAL: muscle bulk and strength are grossly normal.  no obvious joint swelling.  gait is normal and steady PULSES:  no carotid bruit NEURO:  cn 2-12 grossly intact.   readily moves all 4's.  SKIN:  Normal texture and temperature.  No rash or suspicious lesion is visible.   NODES:  None palpable at the neck PSYCH: alert, well-oriented.  Does not appear anxious nor depressed.  Lab Results  Component Value Date   HGBA1C 9.8 09/19/2013      Assessment & Plan:  DM: he needs increased rx.  It is unlikely that he can be managed for very long without insulin, but he wants to try. Memory loss: this complicates the rx of DM. Seizure disorder: in this context, he should avoid hypoglycemia

## 2013-10-23 ENCOUNTER — Telehealth: Payer: Self-pay

## 2013-10-23 NOTE — Telephone Encounter (Signed)
please call [facility]  What oral DM options do you have?

## 2013-10-23 NOTE — Telephone Encounter (Signed)
Pharmacist from [facility] called stating that the Bromocriptine is to expensive for the pt to purchase.  Wanted to know if there is an alternative.  Please advise, Thanks!

## 2013-10-24 MED ORDER — CANAGLIFLOZIN 300 MG PO TABS: 1.0000 | ORAL_TABLET | Freq: Every day | ORAL | Status: DC

## 2013-10-24 MED ORDER — DAPAGLIFLOZIN PROPANEDIOL 10 MG PO TABS: 1.0000 | ORAL_TABLET | Freq: Every day | ORAL | Status: DC

## 2013-10-24 NOTE — Telephone Encounter (Signed)
Pharmacy calling again we need to call in the alternate for the invokana because he will not be able to get invokana without paying a lot. Janumet can be free or [place]

## 2013-10-24 NOTE — Telephone Encounter (Signed)
See below, Thanks!  

## 2013-10-24 NOTE — Telephone Encounter (Signed)
Ok, i have sent a prescription to your pharmacy, for "invokana." i'll see you next time.

## 2013-10-24 NOTE — Telephone Encounter (Signed)
Called Pharmacy. Currently they have [place], [place], and [place]. Also, if pt is prescribed Januvia or Janumet he would qualify for an assistance program so he would get the medication for free.

## 2013-10-24 NOTE — Telephone Encounter (Signed)
Ok i sent rx

## 2013-10-31 ENCOUNTER — Ambulatory Visit (INDEPENDENT_AMBULATORY_CARE_PROVIDER_SITE_OTHER): Payer: No Typology Code available for payment source | Admitting: Endocrinology

## 2013-10-31 ENCOUNTER — Encounter: Payer: Self-pay | Admitting: Endocrinology

## 2013-10-31 VITALS — BP 118/64 | HR 75 | Temp 98.1°F | Ht 70.0 in | Wt 278.0 lb

## 2013-10-31 DIAGNOSIS — IMO0001 Reserved for inherently not codable concepts without codable children: Secondary | ICD-10-CM

## 2013-10-31 DIAGNOSIS — E1165 Type 2 diabetes mellitus with hyperglycemia: Secondary | ICD-10-CM

## 2013-10-31 DIAGNOSIS — IMO0002 Reserved for concepts with insufficient information to code with codable children: Secondary | ICD-10-CM

## 2013-10-31 NOTE — Progress Notes (Signed)
Subjective:    Patient ID: Bryan Mcmillan, male    DOB: Nov 30, 1970, 43 y.o.   MRN: [id number]  HPI pt returns for f/u of DM (dx'ed 2012, when he underwent brain surgery; he was on steroids at the time, but it persisted after discontinuation of the steroids; he has mild neuropathy of the lower extremities; he is unaware of any associated chronic complications; he has been on insulin since dx; he has never had DKA or severe hypoglycemia).  He wants to know if this can be managed without insulin, as he has trouble remembering it. He did not get the parlodel filled, due to cost.  i rx'ed farxiga as an alternative, but he has not yet picked it up.  no cbg record, but states cbg's are still in the high-100's.   Past Medical History  Diagnosis Date  . Asthma   . Depression   . Diabetes   . Headache(784.0)     frequent  . Migraines   . History of blood clots   . Hyperlipidemia   . Obesity   . Sleep apnea   . DVT (deep venous thrombosis), hx of recurrent 05/23/2012  . History of blood clot in brain, 2012 - followed by [facility] 05/23/2012  . Seizure disorder - followed by [facility] 05/23/2012  . Hyperlipemia 08/25/2012  . Anxiety   . Tuberculosis   . Pneumonia   . Kidney disease   . Kidney stones   . Seizure     Past Surgical History  Procedure Laterality Date  . Brain surgery    . Filter for blood clots      History   Social History  . Marital Status: Single    Spouse Name: N/A    Number of Children: 1  . Years of Education: BA   Occupational History  . CUST SERV [employer]   Social History Main Topics  . Smoking status: Former Research scientist (life sciences)  . Smokeless tobacco: Not on file  . Alcohol Use: No  . Drug Use: No  . Sexual Activity: No   Other Topics Concern  . Not on file   Social History Narrative   Work or School: Back of [employer] Situation: lives with sister and mother      Spiritual Beliefs:      Lifestyle: trying to walk and working on  diet      Caffeine Use: does consume             Current Outpatient Prescriptions on File Prior to Visit  Medication Sig Dispense Refill  . busPIRone (BUSPAR) 15 MG tablet Take 15 mg by mouth 1 day or 1 dose.      . clonazePAM (KLONOPIN) 0.5 MG tablet Take 0.5 mg by mouth 2 (two) times daily as needed for anxiety.      . enoxaparin (LOVENOX) 150 MG/ML injection Inject 1 mL (150 mg total) into the skin every 12 (twelve) hours. For blood clots  180 Syringe  1  . gabapentin (NEURONTIN) 300 MG capsule Take 1 capsule (300 mg total) by mouth 3 (three) times daily.  90 capsule  0  . glyBURIDE (DIABETA) 5 MG tablet Take 5 mg by mouth daily with breakfast.      . levETIRAcetam (KEPPRA) 500 MG tablet Take 1 tablet (500 mg total) by mouth 2 (two) times daily.  60 tablet  0  . metFORMIN (GLUCOPHAGE) 1000 MG tablet Take 1 tablet (1,000 mg total) by mouth  2 (two) times daily with a meal. For diabetes management  60 tablet  3  . simvastatin (ZOCOR) 80 MG tablet Take 1 tablet (80 mg total) by mouth daily.  30 tablet  2  . traZODone (DESYREL) 150 MG tablet Take 150 mg by mouth at bedtime.      . Vilazodone HCl (VIIBRYD) 40 MG TABS Take 1 tablet (40 mg total) by mouth daily.  30 tablet  0  . Dapagliflozin Propanediol (FARXIGA) 10 MG TABS Take 1 tablet by mouth daily.  90 tablet  3   No current facility-administered medications on file prior to visit.    Allergies  Allergen Reactions  . Penicillins Other (See Comments)    "childhood reaction"    Family History  Problem Relation Age of Onset  . Hyperlipidemia      parent  . Heart disease      parent  . Stroke      parent  . Diabetes      grandparent /parent  . Obesity Other   . Heart attack Other     BP 118/64  Pulse 75  Temp(Src) 98.1 F (36.7 C) (Oral)  Ht 5\' 10"  (1.778 m)  Wt 278 lb (126.1 kg)  BMI 39.89 kg/m2  SpO2 95%  Review of Systems denies hypoglycemia and weight change.      Objective:   Physical Exam VITAL SIGNS:   See vs page GENERAL: no distress Ext: no edema  Lab Results  Component Value Date   HGBA1C 9.8 09/19/2013      Assessment & Plan:  DM: he needs increased rx.  It is unlikely that he can be managed for very long without insulin, but he wants to try. Memory loss: this complicates the rx of DM. Seizure disorder: in this context, he should avoid hypoglycemia.

## 2013-10-31 NOTE — Patient Instructions (Addendum)
check your blood sugar twice a day.  vary the time of day when you check, between before the 3 meals, and at bedtime.  also check if you have symptoms of your blood sugar being too high or too low.  please keep a record of the readings and bring it to your next appointment here.  You can write it on any piece of paper.  please call us sooner if your blood sugar goes below 70, or if you have a lot of readings over 200.   Please start taking the farxiga.   When you get medicaid, your meds will be easier to afford.   Please come back for a follow-up appointment in 2-3 weeks.

## 2013-11-01 ENCOUNTER — Encounter: Payer: Self-pay | Admitting: Internal Medicine

## 2013-11-01 ENCOUNTER — Ambulatory Visit: Payer: No Typology Code available for payment source | Attending: Internal Medicine | Admitting: Internal Medicine

## 2013-11-01 VITALS — BP 107/74 | HR 76 | Temp 98.8°F | Resp 16

## 2013-11-01 DIAGNOSIS — G43909 Migraine, unspecified, not intractable, without status migrainosus: Secondary | ICD-10-CM | POA: Insufficient documentation

## 2013-11-01 DIAGNOSIS — E119 Type 2 diabetes mellitus without complications: Secondary | ICD-10-CM | POA: Insufficient documentation

## 2013-11-01 DIAGNOSIS — Z86718 Personal history of other venous thrombosis and embolism: Secondary | ICD-10-CM | POA: Insufficient documentation

## 2013-11-01 DIAGNOSIS — Z79899 Other long term (current) drug therapy: Secondary | ICD-10-CM | POA: Insufficient documentation

## 2013-11-01 DIAGNOSIS — R0602 Shortness of breath: Secondary | ICD-10-CM

## 2013-11-01 DIAGNOSIS — F411 Generalized anxiety disorder: Secondary | ICD-10-CM | POA: Insufficient documentation

## 2013-11-01 DIAGNOSIS — J45909 Unspecified asthma, uncomplicated: Secondary | ICD-10-CM | POA: Insufficient documentation

## 2013-11-01 DIAGNOSIS — R0789 Other chest pain: Secondary | ICD-10-CM

## 2013-11-01 DIAGNOSIS — F3289 Other specified depressive episodes: Secondary | ICD-10-CM | POA: Insufficient documentation

## 2013-11-01 DIAGNOSIS — Z131 Encounter for screening for diabetes mellitus: Secondary | ICD-10-CM

## 2013-11-01 DIAGNOSIS — G4730 Sleep apnea, unspecified: Secondary | ICD-10-CM | POA: Insufficient documentation

## 2013-11-01 DIAGNOSIS — R079 Chest pain, unspecified: Secondary | ICD-10-CM | POA: Insufficient documentation

## 2013-11-01 DIAGNOSIS — F329 Major depressive disorder, single episode, unspecified: Secondary | ICD-10-CM | POA: Insufficient documentation

## 2013-11-01 DIAGNOSIS — Z87442 Personal history of urinary calculi: Secondary | ICD-10-CM | POA: Insufficient documentation

## 2013-11-01 DIAGNOSIS — E785 Hyperlipidemia, unspecified: Secondary | ICD-10-CM | POA: Insufficient documentation

## 2013-11-01 DIAGNOSIS — Z794 Long term (current) use of insulin: Secondary | ICD-10-CM | POA: Insufficient documentation

## 2013-11-01 DIAGNOSIS — Z8249 Family history of ischemic heart disease and other diseases of the circulatory system: Secondary | ICD-10-CM | POA: Insufficient documentation

## 2013-11-01 LAB — GLUCOSE, POCT (MANUAL RESULT ENTRY): POC Glucose: 319 mg/dl — AB (ref 70–99)

## 2013-11-01 MED ORDER — INSULIN ASPART 100 UNIT/ML ~~LOC~~ SOLN: 10.0000 [IU] | Freq: Three times a day (TID) | SUBCUTANEOUS | Status: DC

## 2013-11-01 MED ORDER — LEVETIRACETAM 500 MG PO TABS: 500.0000 mg | ORAL_TABLET | Freq: Two times a day (BID) | ORAL | Status: DC

## 2013-11-01 MED ORDER — METFORMIN HCL 1000 MG PO TABS: 1000.0000 mg | ORAL_TABLET | Freq: Two times a day (BID) | ORAL | Status: DC

## 2013-11-01 NOTE — Progress Notes (Signed)
Patient complains of headache States has been having chest pains past three to four months

## 2013-11-01 NOTE — Progress Notes (Signed)
MRN: [id number] Name: Bryan Mcmillan  Sex: male Age: 43 y.o. DOB: March 21, 1971  Allergies: Penicillins  Chief Complaint  Patient presents with  . Headache  . Chest Pain    HPI: Patient is 43 y.o. male who has history of diabetes hyperlipidemia, comes today reported to have on and off chest pain/pressure for the last 3-4 months, last time he had similar symptoms was for 5 days ago which lasted for 30 seconds? Had radiation down to the left arm, patient also reports exertional shortness of breath with one flight of stairs, EKG done today in the office is unchanged compared to one done in February and in September last year, patient has never been evaluated by cardiologist, he reports family history of heart disease. Patient also history of uncontrolled diabetes following up with his endocrinologist was seen yesterday and as per patient he was started on new medication farxiga.  Past Medical History  Diagnosis Date  . Asthma   . Depression   . Diabetes   . Headache(784.0)     frequent  . Migraines   . History of blood clots   . Hyperlipidemia   . Obesity   . Sleep apnea   . DVT (deep venous thrombosis), hx of recurrent 05/23/2012  . History of blood clot in brain, 2012 - followed by [facility] 05/23/2012  . Seizure disorder - followed by [facility] 05/23/2012  . Hyperlipemia 08/25/2012  . Anxiety   . Tuberculosis   . Pneumonia   . Kidney disease   . Kidney stones   . Seizure     Past Surgical History  Procedure Laterality Date  . Brain surgery    . Filter for blood clots        Medication List       This list is accurate as of: 11/01/13 12:43 PM.  Always use your most recent med list.               busPIRone 15 MG tablet  Commonly known as:  BUSPAR  Take 15 mg by mouth 1 day or 1 dose.     clonazePAM 0.5 MG tablet  Commonly known as:  KLONOPIN  Take 0.5 mg by mouth 2 (two) times daily as needed for anxiety.     Dapagliflozin Propanediol 10 MG  Tabs  Commonly known as:  FARXIGA  Take 1 tablet by mouth daily.     enoxaparin 150 MG/ML injection  Commonly known as:  LOVENOX  Inject 1 mL (150 mg total) into the skin every 12 (twelve) hours. For blood clots     gabapentin 300 MG capsule  Commonly known as:  NEURONTIN  Take 1 capsule (300 mg total) by mouth 3 (three) times daily.     glyBURIDE 5 MG tablet  Commonly known as:  DIABETA  Take 5 mg by mouth daily with breakfast.     insulin aspart 100 UNIT/ML injection  Commonly known as:  novoLOG  Inject 10 Units into the skin 3 (three) times daily before meals.     levETIRAcetam 500 MG tablet  Commonly known as:  KEPPRA  Take 1 tablet (500 mg total) by mouth 2 (two) times daily.     metFORMIN 1000 MG tablet  Commonly known as:  GLUCOPHAGE  Take 1 tablet (1,000 mg total) by mouth 2 (two) times daily with a meal. For diabetes management     simvastatin 80 MG tablet  Commonly known as:  ZOCOR  Take 1 tablet (80 mg  total) by mouth daily.     traZODone 150 MG tablet  Commonly known as:  DESYREL  Take 150 mg by mouth at bedtime.     Vilazodone HCl 40 MG Tabs  Commonly known as:  VIIBRYD  Take 1 tablet (40 mg total) by mouth daily.        Meds ordered this encounter  Medications  . insulin aspart (NOVOLOG) 100 UNIT/ML injection    Sig: Inject 10 Units into the skin 3 (three) times daily before meals.    Dispense:  3 vial    Refill:  PRN  . levETIRAcetam (KEPPRA) 500 MG tablet    Sig: Take 1 tablet (500 mg total) by mouth 2 (two) times daily.    Dispense:  60 tablet    Refill:  0  . metFORMIN (GLUCOPHAGE) 1000 MG tablet    Sig: Take 1 tablet (1,000 mg total) by mouth 2 (two) times daily with a meal. For diabetes management    Dispense:  60 tablet    Refill:  3    Immunization History  Administered Date(s) Administered  . Influenza Whole 05/02/2012    Family History  Problem Relation Age of Onset  . Hyperlipidemia      parent  . Heart disease      parent    . Stroke      parent  . Diabetes      grandparent /parent  . Obesity Other   . Heart attack Other     History  Substance Use Topics  . Smoking status: Former Research scientist (life sciences)  . Smokeless tobacco: Not on file  . Alcohol Use: No    Review of Systems   As noted in HPI  Filed Vitals:   11/01/13 1219  BP: 107/74  Pulse: 76  Temp: 98.8 F (37.1 C)  Resp: 16    Physical Exam  Physical Exam  Constitutional: No distress.  Eyes: EOM are normal. Pupils are equal, round, and reactive to light.  Cardiovascular: Normal rate and regular rhythm.   Pulmonary/Chest: Breath sounds normal. No respiratory distress. He has no wheezes. He has no rales.    CBC    Component Value Date/Time   WBC 8.5 09/23/2013 1355   RBC 5.16 09/23/2013 1355   HGB 15.2 09/23/2013 1355   HCT 44.2 09/23/2013 1355   PLT 310 09/23/2013 1355   MCV 85.7 09/23/2013 1355   LYMPHSABS 3.6 07/11/2013 1808   MONOABS 0.6 07/11/2013 1808   EOSABS 0.1 07/11/2013 1808   BASOSABS 0.0 07/11/2013 1808    CMP     Component Value Date/Time   NA 137 09/23/2013 1355   K 4.2 09/23/2013 1355   CL 97 09/23/2013 1355   CO2 21 09/23/2013 1355   GLUCOSE 284* 09/23/2013 1355   BUN 14 09/23/2013 1355   CREATININE 0.86 09/23/2013 1355   CALCIUM 9.9 09/23/2013 1355   PROT 7.7 09/23/2013 1355   ALBUMIN 4.2 09/23/2013 1355   AST 30 09/23/2013 1355   ALT 53 09/23/2013 1355   ALKPHOS 69 09/23/2013 1355   BILITOT 0.2* 09/23/2013 1355   GFRNONAA >90 09/23/2013 1355   GFRAA >90 09/23/2013 1355    Lab Results  Component Value Date/Time   CHOL 141 09/26/2013  6:20 AM    No components found with this basename: hga1c    Lab Results  Component Value Date/Time   AST 30 09/23/2013  1:55 PM    Assessment and Plan  Chest pain - Plan: EKG 12-Lead, unchanged.  The patient has the symptoms ongoing for several months, reports family history of heart disease, I have referred him to cardiology for further evaluation.  SOB (shortness of breath) on  exertion - Plan: Ambulatory referral to Cardiology  Chest pressure - Plan: Ambulatory referral to Cardiology  DM (diabetes mellitus screen) - Plan: Patient following up with his endocrinologist, was started on FARXIGA Glucose (CBG), insulin aspart (NOVOLOG) 100 UNIT/ML injection, metFORMIN (GLUCOPHAGE) 1000 MG tablet medication refill done. Advised patient to keep the fingerstick log and  follow with his endocrinologist in 2-3 weeks as scheduled.   Return in about 4 months (around 03/03/2014).  Lorayne Marek, MD

## 2013-11-20 ENCOUNTER — Other Ambulatory Visit: Payer: Self-pay

## 2013-11-20 DIAGNOSIS — Z131 Encounter for screening for diabetes mellitus: Secondary | ICD-10-CM

## 2013-11-20 MED ORDER — METFORMIN HCL 1000 MG PO TABS: 1000.0000 mg | ORAL_TABLET | Freq: Two times a day (BID) | ORAL | Status: DC

## 2013-11-21 ENCOUNTER — Ambulatory Visit (INDEPENDENT_AMBULATORY_CARE_PROVIDER_SITE_OTHER): Payer: No Typology Code available for payment source | Admitting: Endocrinology

## 2013-11-21 ENCOUNTER — Encounter: Payer: Self-pay | Admitting: Endocrinology

## 2013-11-21 ENCOUNTER — Ambulatory Visit: Payer: No Typology Code available for payment source | Attending: Cardiology | Admitting: Cardiology

## 2013-11-21 ENCOUNTER — Encounter: Payer: Self-pay | Admitting: Cardiology

## 2013-11-21 VITALS — BP 114/76 | HR 86 | Temp 99.1°F | Resp 16 | Ht 71.0 in | Wt 277.0 lb

## 2013-11-21 VITALS — BP 120/60 | HR 100 | Temp 98.6°F | Ht 71.0 in | Wt 279.0 lb

## 2013-11-21 DIAGNOSIS — J45909 Unspecified asthma, uncomplicated: Secondary | ICD-10-CM | POA: Insufficient documentation

## 2013-11-21 DIAGNOSIS — E1165 Type 2 diabetes mellitus with hyperglycemia: Secondary | ICD-10-CM

## 2013-11-21 DIAGNOSIS — E785 Hyperlipidemia, unspecified: Secondary | ICD-10-CM | POA: Insufficient documentation

## 2013-11-21 DIAGNOSIS — I82409 Acute embolism and thrombosis of unspecified deep veins of unspecified lower extremity: Secondary | ICD-10-CM

## 2013-11-21 DIAGNOSIS — R0789 Other chest pain: Secondary | ICD-10-CM | POA: Insufficient documentation

## 2013-11-21 DIAGNOSIS — IMO0001 Reserved for inherently not codable concepts without codable children: Secondary | ICD-10-CM

## 2013-11-21 DIAGNOSIS — F430 Acute stress reaction: Secondary | ICD-10-CM

## 2013-11-21 DIAGNOSIS — F17200 Nicotine dependence, unspecified, uncomplicated: Secondary | ICD-10-CM | POA: Insufficient documentation

## 2013-11-21 DIAGNOSIS — IMO0002 Reserved for concepts with insufficient information to code with codable children: Secondary | ICD-10-CM

## 2013-11-21 DIAGNOSIS — I2510 Atherosclerotic heart disease of native coronary artery without angina pectoris: Secondary | ICD-10-CM | POA: Insufficient documentation

## 2013-11-21 DIAGNOSIS — R079 Chest pain, unspecified: Secondary | ICD-10-CM

## 2013-11-21 DIAGNOSIS — E119 Type 2 diabetes mellitus without complications: Secondary | ICD-10-CM | POA: Insufficient documentation

## 2013-11-21 DIAGNOSIS — F339 Major depressive disorder, recurrent, unspecified: Secondary | ICD-10-CM

## 2013-11-21 DIAGNOSIS — F439 Reaction to severe stress, unspecified: Secondary | ICD-10-CM

## 2013-11-21 MED ORDER — BROMOCRIPTINE MESYLATE 2.5 MG PO TABS: 1.2500 mg | ORAL_TABLET | Freq: Every day | ORAL | Status: DC

## 2013-11-21 NOTE — Progress Notes (Signed)
HPI Bryan Mcmillan is a 43 year old white male referred by Advani for the evaluation of chest discomfort.  He's had chest discomfort off and on for years. It is generally at rest and a sharp well localized over his left breast. It is not associated with activity. There no other symptoms associated with it including nausea vomiting diaphoresis or shortness of breath. He does frighten him. Note that he rides a bicycle for his primary transportation. He does not have exertional chest discomfort.  He does have a history of blood clots and is on enoxaparin twice a day. He apparently is followed by neurology for this.  His risk factors for coronary disease include male sex as well as diabetes. Because of being homeless, his recent hemoglobin A1c was 12%. He now is back on medications and his last hemoglobin A1c was less than 10. He does admit to smoking a couple cigarettes a day. He started last September.  He has a lot of history of psychological problems. He has a whole host of other complaints which seemed mostly psychosomatic today.  His EKGs have been unremarkable and unchanged. A 2-D echocardiogram from [facility] couple years ago showed mild LVH otherwise unremarkable but technically challenging.  Past Medical History  Diagnosis Date  . Asthma   . Depression   . Diabetes   . Headache(784.0)     frequent  . Migraines   . History of blood clots   . Hyperlipidemia   . Obesity   . Sleep apnea   . DVT (deep venous thrombosis), hx of recurrent 05/23/2012  . History of blood clot in brain, 2012 - followed by [facility] 05/23/2012  . Seizure disorder - followed by [facility] 05/23/2012  . Hyperlipemia 08/25/2012  . Anxiety   . Tuberculosis   . Pneumonia   . Kidney disease   . Kidney stones   . Seizure     Current Outpatient Prescriptions  Medication Sig Dispense Refill  . busPIRone (BUSPAR) 15 MG tablet Take 15 mg by mouth 1 day or 1 dose.      . clonazePAM  (KLONOPIN) 0.5 MG tablet Take 0.5 mg by mouth 2 (two) times daily as needed for anxiety.      . Dapagliflozin Propanediol (FARXIGA) 10 MG TABS Take 1 tablet by mouth daily.  90 tablet  3  . gabapentin (NEURONTIN) 300 MG capsule Take 1 capsule (300 mg total) by mouth 3 (three) times daily.  90 capsule  0  . glyBURIDE (DIABETA) 5 MG tablet Take 5 mg by mouth daily with breakfast.      . insulin aspart (NOVOLOG) 100 UNIT/ML injection Inject 10 Units into the skin 3 (three) times daily before meals.  3 vial  PRN  . levETIRAcetam (KEPPRA) 500 MG tablet Take 1 tablet (500 mg total) by mouth 2 (two) times daily.  60 tablet  0  . metFORMIN (GLUCOPHAGE) 1000 MG tablet Take 1 tablet (1,000 mg total) by mouth 2 (two) times daily with a meal. For diabetes management  60 tablet  3  . simvastatin (ZOCOR) 80 MG tablet Take 1 tablet (80 mg total) by mouth daily.  30 tablet  2  . traZODone (DESYREL) 150 MG tablet Take 150 mg by mouth at bedtime.      . Vilazodone HCl (VIIBRYD) 40 MG TABS Take 1 tablet (40 mg total) by mouth daily.  30 tablet  0  . enoxaparin (LOVENOX) 150 MG/ML injection Inject 1 mL (150 mg total) into the skin every  12 (twelve) hours. For blood clots  180 Syringe  1   No current facility-administered medications for this visit.    Allergies  Allergen Reactions  . Penicillins Other (See Comments)    "childhood reaction"    Family History  Problem Relation Age of Onset  . Hyperlipidemia      parent  . Heart disease      parent  . Stroke      parent  . Diabetes      grandparent /parent  . Obesity Other   . Heart attack Other     History   Social History  . Marital Status: Single    Spouse Name: N/A    Number of Children: 1  . Years of Education: BA   Occupational History  . 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 Smoking status: Former Research scientist (life sciences)  . Smokeless tobacco: Not on file  . Alcohol Use: No  . Drug Use: No  . Sexual Activity: No   Other Topics  Concern  . Not on file   Social History Narrative   Work or School: Back of [employer] Situation: lives with sister and mother      Spiritual Beliefs:      Lifestyle: trying to walk and working on diet      Caffeine Use: does consume             ROS ALL NEGATIVE EXCEPT THOSE NOTED IN HPI  PE  General Appearance: well developed, well nourished in no acute distress, obese HEENT: symmetrical face, PERRLA, good dentition  Neck: no JVD, thyromegaly, or adenopathy, trachea midline Chest: symmetric without deformity Cardiac: PMI non-displaced, RRR, normal S1, S2, no gallop or murmur Lung: clear to ausculation and percussion Vascular: all pulses full without bruits  Abdominal: nondistended, nontender, good bowel sounds, no HSM, no bruits Extremities: no cyanosis, clubbing or edema, no sign of DVT, no varicosities  Skin: normal color, no rashes Neuro: alert and oriented x 3, non-focal Pysch: normal affect  EKG Normal sinus rhythm, low-voltage, no ST segment changes. BMET    Component Value Date/Time   NA 137 09/23/2013 1355   K 4.2 09/23/2013 1355   CL 97 09/23/2013 1355   CO2 21 09/23/2013 1355   GLUCOSE 284* 09/23/2013 1355   BUN 14 09/23/2013 1355   CREATININE 0.86 09/23/2013 1355   CALCIUM 9.9 09/23/2013 1355   GFRNONAA >90 09/23/2013 1355   GFRAA >90 09/23/2013 1355    Lipid Panel     Component Value Date/Time   CHOL 141 09/26/2013 0620   TRIG 165* 09/26/2013 0620   HDL 33* 09/26/2013 0620   CHOLHDL 4.3 09/26/2013 0620   VLDL 33 09/26/2013 0620   LDLCALC 75 09/26/2013 0620    CBC    Component Value Date/Time   WBC 8.5 09/23/2013 1355   RBC 5.16 09/23/2013 1355   HGB 15.2 09/23/2013 1355   HCT 44.2 09/23/2013 1355   PLT 310 09/23/2013 1355   MCV 85.7 09/23/2013 1355   MCH 29.5 09/23/2013 1355   MCHC 34.4 09/23/2013 1355   RDW 12.5 09/23/2013 1355   LYMPHSABS 3.6 07/11/2013 1808   MONOABS 0.6 07/11/2013 1808   EOSABS 0.1 07/11/2013 1808   BASOSABS 0.0 07/11/2013  1808

## 2013-11-21 NOTE — Patient Instructions (Addendum)
check your blood sugar twice a day.  vary the time of day when you check, between before the 3 meals, and at bedtime.  also check if you have symptoms of your blood sugar being too high or too low.  please keep a record of the readings and bring it to your next appointment here.  You can write it on any piece of paper.  please call us sooner if your blood sugar goes below 70, or if you have a lot of readings over 200.   Please add "bromocriptine," to help your blood sugar. It has possible side effects of nausea and dizziness.  These go away with time.  You can avoid these by taking it at bedtime.   Please come back for a follow-up appointment in 2-3 weeks.

## 2013-11-21 NOTE — Progress Notes (Signed)
Pt here to f/u for multiple DVT's with anticougs' long term Pt states I have a blood clot in my left ear

## 2013-11-21 NOTE — Assessment & Plan Note (Signed)
LDL at goal for diabetes. No change in his statin.

## 2013-11-21 NOTE — Assessment & Plan Note (Signed)
I have spent a long time reassuring him this is noncardiac. See history for description. I suspect much of this is from psychological stress and imbalance. I did review the importance of getting his diabetes under control with a hemoglobin A1c of 7% if possible. Also talked about weight control and continuing to ride his bike. I've advised him not to smoke. Fortunately he does not smoke much.

## 2013-11-21 NOTE — Progress Notes (Signed)
Subjective:    Patient ID: Bryan Mcmillan, male    DOB: 12/29/1970, 43 y.o.   MRN: [id number]  HPI pt returns for f/u of DM (dx'ed 2012, when he underwent brain surgery; he was on steroids at the time, but it persisted after discontinuation of the steroids; he has mild neuropathy of the lower extremities; he is unaware of any associated chronic complications; he has been on insulin since dx; he has never had DKA or severe hypoglycemia).  He wants to know if this can be managed without insulin, as he has trouble remembering it.  He has gotten his medicaid.  He seldom takes novolog.  no cbg record, but states cbg's are in the high-100's.  He seldom has hypoglycemia, and these episodes are mild.  These happen after taking the insulin.   Past Medical History  Diagnosis Date  . Asthma   . Depression   . Diabetes   . Headache(784.0)     frequent  . Migraines   . History of blood clots   . Hyperlipidemia   . Obesity   . Sleep apnea   . DVT (deep venous thrombosis), hx of recurrent 05/23/2012  . History of blood clot in brain, 2012 - followed by [facility] 05/23/2012  . Seizure disorder - followed by [place] [facility] 05/23/2012  . Hyperlipemia 08/25/2012  . Anxiety   . Tuberculosis   . Pneumonia   . Kidney disease   . Kidney stones   . Seizure     Past Surgical History  Procedure Laterality Date  . Brain surgery    . Filter for blood clots      History   Social History  . Marital Status: Single    Spouse Name: N/A    Number of Children: 1  . Years of Education: BA   Occupational History  . CUST SERV [employer]   Social History Main Topics  . Smoking status: Former Research scientist (life sciences)  . Smokeless tobacco: Not on file  . Alcohol Use: No  . Drug Use: No  . Sexual Activity: No   Other Topics Concern  . Not on file   Social History Narrative   Work or School: Back of [employer] Situation: lives with sister and mother      Spiritual Beliefs:      Lifestyle:  trying to walk and working on diet      Caffeine Use: does consume             Current Outpatient Prescriptions on File Prior to Visit  Medication Sig Dispense Refill  . busPIRone (BUSPAR) 15 MG tablet Take 15 mg by mouth 1 day or 1 dose.      . clonazePAM (KLONOPIN) 0.5 MG tablet Take 0.5 mg by mouth 2 (two) times daily as needed for anxiety.      . Dapagliflozin Propanediol (FARXIGA) 10 MG TABS Take 1 tablet by mouth daily.  90 tablet  3  . enoxaparin (LOVENOX) 150 MG/ML injection Inject 1 mL (150 mg total) into the skin every 12 (twelve) hours. For blood clots  180 Syringe  1  . gabapentin (NEURONTIN) 300 MG capsule Take 1 capsule (300 mg total) by mouth 3 (three) times daily.  90 capsule  0  . glyBURIDE (DIABETA) 5 MG tablet Take 5 mg by mouth daily with breakfast.      . insulin aspart (NOVOLOG) 100 UNIT/ML injection Inject 10 Units into the skin 3 (three) times daily  before meals.  3 vial  PRN  . levETIRAcetam (KEPPRA) 500 MG tablet Take 1 tablet (500 mg total) by mouth 2 (two) times daily.  60 tablet  0  . metFORMIN (GLUCOPHAGE) 1000 MG tablet Take 1 tablet (1,000 mg total) by mouth 2 (two) times daily with a meal. For diabetes management  60 tablet  3  . simvastatin (ZOCOR) 80 MG tablet Take 1 tablet (80 mg total) by mouth daily.  30 tablet  2  . traZODone (DESYREL) 150 MG tablet Take 150 mg by mouth at bedtime.      . Vilazodone HCl (VIIBRYD) 40 MG TABS Take 1 tablet (40 mg total) by mouth daily.  30 tablet  0   No current facility-administered medications on file prior to visit.    Allergies  Allergen Reactions  . Penicillins Other (See Comments)    "childhood reaction"    Family History  Problem Relation Age of Onset  . Hyperlipidemia      parent  . Heart disease      parent  . Stroke      parent  . Diabetes      grandparent /parent  . Obesity Other   . Heart attack Other     BP 120/60  Pulse 100  Temp(Src) 98.6 F (37 C) (Oral)  Ht 5\' 11"  (1.803 m)  Wt  279 lb (126.554 kg)  BMI 38.93 kg/m2  SpO2 95%  Review of Systems He denies LOC and weight change.   Objective:   Physical Exam VITAL SIGNS:  See vs page GENERAL: no distress SKIN:  Insulin (and lovenox) injection sites at the anterior abdomen are normal.    Lab Results  Component Value Date   HGBA1C 9.8 09/19/2013      Assessment & Plan:  DM: he needs increased rx.  It is unlikely that he can be managed for very long without insulin, but he wants to try. Memory loss: this complicates the rx of DM. Seizure disorder: in this context, he should avoid hypoglycemia.

## 2013-12-04 ENCOUNTER — Encounter: Payer: Self-pay | Admitting: Internal Medicine

## 2013-12-17 ENCOUNTER — Other Ambulatory Visit: Payer: Self-pay | Admitting: Internal Medicine

## 2013-12-17 DIAGNOSIS — R569 Unspecified convulsions: Secondary | ICD-10-CM

## 2013-12-19 ENCOUNTER — Encounter: Payer: Self-pay | Admitting: Endocrinology

## 2013-12-19 ENCOUNTER — Ambulatory Visit (INDEPENDENT_AMBULATORY_CARE_PROVIDER_SITE_OTHER): Payer: Medicaid Other | Admitting: Endocrinology

## 2013-12-19 VITALS — BP 118/60 | HR 76 | Temp 98.7°F | Ht 71.0 in | Wt 268.0 lb

## 2013-12-19 DIAGNOSIS — IMO0002 Reserved for concepts with insufficient information to code with codable children: Secondary | ICD-10-CM

## 2013-12-19 DIAGNOSIS — E1165 Type 2 diabetes mellitus with hyperglycemia: Secondary | ICD-10-CM

## 2013-12-19 DIAGNOSIS — IMO0001 Reserved for inherently not codable concepts without codable children: Secondary | ICD-10-CM

## 2013-12-19 LAB — MICROALBUMIN / CREATININE URINE RATIO
Creatinine,U: 121.2 mg/dL
MICROALB/CREAT RATIO: 1 mg/g (ref 0.0–30.0)
Microalb, Ur: 1.2 mg/dL (ref 0.0–1.9)

## 2013-12-19 LAB — HEMOGLOBIN A1C: Hgb A1c MFr Bld: 8.3 % — ABNORMAL HIGH (ref 4.6–6.5)

## 2013-12-19 MED ORDER — SITAGLIPTIN PHOSPHATE 100 MG PO TABS: 100.0000 mg | ORAL_TABLET | Freq: Every day | ORAL | Status: DC

## 2013-12-19 MED ORDER — GLYBURIDE 2.5 MG PO TABS: 2.5000 mg | ORAL_TABLET | Freq: Every day | ORAL | Status: DC

## 2013-12-19 MED ORDER — CANAGLIFLOZIN 300 MG PO TABS: 1.0000 | ORAL_TABLET | Freq: Every day | ORAL | Status: DC

## 2013-12-19 NOTE — Progress Notes (Signed)
Subjective:    Patient ID: Bryan Mcmillan, male    DOB: 1971-02-10, 43 y.o.   MRN: [id number]  HPI pt returns for f/u of DM (dx'ed 2012, when he underwent brain surgery; he was on steroids at the time, but it persisted after discontinuation of the steroids; he has mild neuropathy of the lower extremities; he is unaware of any associated chronic complications; started on insulin at time of dx, but he stopped it in early 2015; he takes 4 oral meds; he has never had pancreatitis; DKA or severe hypoglycemia).  He wants to know if this can be managed without insulin.  no cbg record, but states cbg's vary from 60-150.  He has urinary frequency.   Past Medical History  Diagnosis Date  . Asthma   . Depression   . Diabetes   . Headache(784.0)     frequent  . Migraines   . History of blood clots   . Hyperlipidemia   . Obesity   . Sleep apnea   . DVT (deep venous thrombosis), hx of recurrent 05/23/2012  . History of blood clot in brain, 2012 - followed by [facility] 05/23/2012  . Seizure disorder - followed by [facility] 05/23/2012  . Hyperlipemia 08/25/2012  . Anxiety   . Tuberculosis   . Pneumonia   . Kidney disease   . Kidney stones   . Seizure     Past Surgical History  Procedure Laterality Date  . Brain surgery    . Filter for blood clots      History   Social History  . Marital Status: Single    Spouse Name: N/A    Number of Children: 1  . Years of Education: BA   Occupational History  . CUST SERV [employer]   Social History Main Topics  . Smoking status: Former Research scientist (life sciences)  . Smokeless tobacco: Not on file  . Alcohol Use: No  . Drug Use: No  . Sexual Activity: No   Other Topics Concern  . Not on file   Social History Narrative   Work or School: Back of [employer] Situation: lives with sister and mother      Spiritual Beliefs:      Lifestyle: trying to walk and working on diet      Caffeine Use: does consume             Current  Outpatient Prescriptions on File Prior to Visit  Medication Sig Dispense Refill  . bromocriptine (PARLODEL) 2.5 MG tablet Take 0.5 tablets (1.25 mg total) by mouth daily.  15 tablet  11  . busPIRone (BUSPAR) 15 MG tablet Take 15 mg by mouth 1 day or 1 dose.      . clonazePAM (KLONOPIN) 0.5 MG tablet Take 0.5 mg by mouth 2 (two) times daily as needed for anxiety.      . enoxaparin (LOVENOX) 150 MG/ML injection Inject 1 mL (150 mg total) into the skin every 12 (twelve) hours. For blood clots  180 Syringe  1  . gabapentin (NEURONTIN) 300 MG capsule Take 1 capsule (300 mg total) by mouth 3 (three) times daily.  90 capsule  0  . levETIRAcetam (KEPPRA) 500 MG tablet TAKE 1 TABLET BY MOUTH 2 TIMES DAILY.  60 tablet  0  . metFORMIN (GLUCOPHAGE) 1000 MG tablet Take 1 tablet (1,000 mg total) by mouth 2 (two) times daily with a meal. For diabetes management  60 tablet  3  .  simvastatin (ZOCOR) 80 MG tablet Take 1 tablet (80 mg total) by mouth daily.  30 tablet  2  . traZODone (DESYREL) 150 MG tablet Take 150 mg by mouth at bedtime.      . Vilazodone HCl (VIIBRYD) 40 MG TABS Take 1 tablet (40 mg total) by mouth daily.  30 tablet  0   No current facility-administered medications on file prior to visit.    Allergies  Allergen Reactions  . Penicillins Other (See Comments)    "childhood reaction"    Family History  Problem Relation Age of Onset  . Hyperlipidemia      parent  . Heart disease      parent  . Stroke      parent  . Diabetes      grandparent /parent  . Obesity Other   . Heart attack Other     BP 118/60  Pulse 76  Temp(Src) 98.7 F (37.1 C) (Oral)  Ht 5\' 11"  (1.803 m)  Wt 268 lb (121.564 kg)  BMI 37.39 kg/m2  SpO2 97%    Review of Systems Denies LOC and weight change.     Objective:   Physical Exam VITAL SIGNS:  See vs page GENERAL: no distress Ext: no edema  Lab Results  Component Value Date   WBC 8.5 09/23/2013   HGB 15.2 09/23/2013   HCT 44.2 09/23/2013   PLT  310 09/23/2013   GLUCOSE 284* 09/23/2013   CHOL 141 09/26/2013   TRIG 165* 09/26/2013   HDL 33* 09/26/2013   LDLDIRECT 171.6 05/23/2012   LDLCALC 75 09/26/2013   ALT 53 09/23/2013   AST 30 09/23/2013   NA 137 09/23/2013   K 4.2 09/23/2013   CL 97 09/23/2013   CREATININE 0.86 09/23/2013   BUN 14 09/23/2013   CO2 21 09/23/2013   TSH 3.164 09/26/2013   INR 1.03 07/11/2013   HGBA1C 8.3* 12/19/2013   MICROALBUR 1.2 12/19/2013      Assessment & Plan:  DM: moderate exacerbation Hypoglycemia, new: due to glipizide: in this context, we need to reduce it, despite the increased a1c   Patient Instructions  check your blood sugar twice a day.  vary the time of day when you check, between before the 3 meals, and at bedtime.  also check if you have symptoms of your blood sugar being too high or too low.  please keep a record of the readings and bring it to your next appointment here.  You can write it on any piece of paper.  please call us sooner if your blood sugar goes below 70, or if you have a lot of readings over 200.   i have sent a prescription to your pharmacy, to change farxiga to invokana.  Please come back for a follow-up appointment in 3 months. blood tests are being requested for you today.  We'll contact you with results. Based on the results, we'll either reduce the glyburide or change it to [place].

## 2013-12-19 NOTE — Patient Instructions (Addendum)
check your blood sugar twice a day.  vary the time of day when you check, between before the 3 meals, and at bedtime.  also check if you have symptoms of your blood sugar being too high or too low.  please keep a record of the readings and bring it to your next appointment here.  You can write it on any piece of paper.  please call us sooner if your blood sugar goes below 70, or if you have a lot of readings over 200.   i have sent a prescription to your pharmacy, to change farxiga to invokana.  Please come back for a follow-up appointment in 3 months. blood tests are being requested for you today.  We'll contact you with results. Based on the results, we'll either reduce the glyburide or change it to [place].

## 2013-12-20 ENCOUNTER — Telehealth: Payer: Self-pay

## 2013-12-20 NOTE — Telephone Encounter (Signed)
Ok to continue [place]

## 2013-12-20 NOTE — Telephone Encounter (Signed)
Pharmacist from [facility] called stating that the [place] has been approved from the previous Rx that was sent. Pharmacist wanted to know if you wanted to keep pt on his current medication regimen or change.   Please advise, Thanks!

## 2013-12-21 NOTE — Telephone Encounter (Signed)
Pharmacy notified. Pharmacist stated that pt already picked up medication.

## 2013-12-21 NOTE — Telephone Encounter (Signed)
Instead of invokana

## 2013-12-21 NOTE — Telephone Encounter (Signed)
Noted, will this medication take place of any of the other medications the pt is on? Please advise, Thanks!

## 2014-01-16 ENCOUNTER — Other Ambulatory Visit: Payer: Self-pay | Admitting: Internal Medicine

## 2014-01-17 ENCOUNTER — Other Ambulatory Visit: Payer: Self-pay | Admitting: Emergency Medicine

## 2014-01-17 MED ORDER — SIMVASTATIN 80 MG PO TABS: 80.0000 mg | ORAL_TABLET | Freq: Every day | ORAL | Status: DC

## 2014-02-19 ENCOUNTER — Telehealth: Payer: Self-pay

## 2014-02-19 ENCOUNTER — Other Ambulatory Visit: Payer: Self-pay | Admitting: Internal Medicine

## 2014-02-19 ENCOUNTER — Telehealth: Payer: Self-pay | Admitting: Internal Medicine

## 2014-02-19 NOTE — Telephone Encounter (Signed)
Patient dropped off form to be filled out Called patient to find out what type of procedure he Is having done Patient is having a deep cleaning and a tooth extraction

## 2014-02-19 NOTE — Telephone Encounter (Signed)
Pt. Needs form faxed to [facility] with med's info for future procedure. Please, f/u with Pt.

## 2014-02-20 ENCOUNTER — Telehealth: Payer: Self-pay

## 2014-02-20 NOTE — Telephone Encounter (Signed)
Spoke with patient yesterday in the lobby about his procedure When form is filled out by provided, it will be faxed to the office

## 2014-03-22 ENCOUNTER — Ambulatory Visit: Payer: Self-pay | Admitting: Endocrinology

## 2014-03-25 ENCOUNTER — Other Ambulatory Visit: Payer: Self-pay | Admitting: Internal Medicine

## 2014-03-26 ENCOUNTER — Encounter: Payer: Self-pay | Admitting: Endocrinology

## 2014-03-26 ENCOUNTER — Ambulatory Visit (INDEPENDENT_AMBULATORY_CARE_PROVIDER_SITE_OTHER): Payer: Medicaid Other | Admitting: Endocrinology

## 2014-03-26 VITALS — BP 114/64 | HR 91 | Temp 97.2°F | Ht 71.0 in | Wt 265.0 lb

## 2014-03-26 DIAGNOSIS — E1165 Type 2 diabetes mellitus with hyperglycemia: Secondary | ICD-10-CM

## 2014-03-26 DIAGNOSIS — IMO0002 Reserved for concepts with insufficient information to code with codable children: Secondary | ICD-10-CM

## 2014-03-26 DIAGNOSIS — IMO0001 Reserved for inherently not codable concepts without codable children: Secondary | ICD-10-CM

## 2014-03-26 LAB — HEMOGLOBIN A1C: Hgb A1c MFr Bld: 7.8 % — ABNORMAL HIGH (ref 4.6–6.5)

## 2014-03-26 MED ORDER — GLYBURIDE 1.25 MG PO TABS: 1.2500 mg | ORAL_TABLET | Freq: Every day | ORAL | Status: DC

## 2014-03-26 MED ORDER — PIOGLITAZONE HCL 45 MG PO TABS: 45.0000 mg | ORAL_TABLET | Freq: Every day | ORAL | Status: DC
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
## 2014-03-26 NOTE — Patient Instructions (Addendum)
check your blood sugar once a day.  vary the time of day when you check, between before the 3 meals, and at bedtime.  also check if you have symptoms of your blood sugar being too high or too low.  please keep a record of the readings and bring it to your next appointment here.  You can write it on any piece of paper.  please call us sooner if your blood sugar goes below 70, or if you have a lot of readings over 200.   Please come back for a follow-up appointment in 3 months. blood tests are being requested for you today.  We'll contact you with results.  Based on the result, i hope to be able to reduce the glyburide, even if we have to add another pill.  dia

## 2014-04-15 ENCOUNTER — Encounter (HOSPITAL_COMMUNITY): Payer: Self-pay | Admitting: Emergency Medicine

## 2014-04-15 ENCOUNTER — Emergency Department (HOSPITAL_COMMUNITY)
Admission: EM | Admit: 2014-04-15 | Discharge: 2014-04-15 | Discharge status: Against Medical Advice | Payer: Medicaid Other | Attending: Emergency Medicine | Admitting: Emergency Medicine

## 2014-04-15 DIAGNOSIS — E119 Type 2 diabetes mellitus without complications: Secondary | ICD-10-CM | POA: Diagnosis not present

## 2014-04-15 DIAGNOSIS — F410 Panic disorder [episodic paroxysmal anxiety] without agoraphobia: Secondary | ICD-10-CM | POA: Insufficient documentation

## 2014-04-15 DIAGNOSIS — E669 Obesity, unspecified: Secondary | ICD-10-CM | POA: Diagnosis not present

## 2014-04-15 DIAGNOSIS — J45909 Unspecified asthma, uncomplicated: Secondary | ICD-10-CM | POA: Insufficient documentation

## 2014-04-15 DIAGNOSIS — Z87891 Personal history of nicotine dependence: Secondary | ICD-10-CM | POA: Insufficient documentation

## 2014-04-15 DIAGNOSIS — Z5320 Procedure and treatment not carried out because of patient's decision for unspecified reasons: Secondary | ICD-10-CM

## 2014-04-15 NOTE — ED Notes (Signed)
Pt called multiple times to take to a room and not answering.

## 2014-04-15 NOTE — ED Notes (Signed)
Per pt sts he is here for a panic attack. Pt currently calm. Denies SI/HI

## 2014-04-19 NOTE — ED Provider Notes (Signed)
Patient left ED before being seen by myself for evaluation. Pt was triaged as "here for a panic attack. Pt currently calm. Denies SI/HI." Patient left before being roomed.  Filed Vitals:   04/15/14 1418  BP: 109/66  Pulse: 81  Temp: 98.3 F (36.8 C)  Resp: [place], PA-C 04/19/14 1227

## 2014-04-25 ENCOUNTER — Emergency Department (INDEPENDENT_AMBULATORY_CARE_PROVIDER_SITE_OTHER): Payer: Medicaid Other

## 2014-04-25 ENCOUNTER — Other Ambulatory Visit: Payer: Self-pay

## 2014-04-25 ENCOUNTER — Encounter (HOSPITAL_COMMUNITY): Payer: Self-pay | Admitting: Emergency Medicine

## 2014-04-25 ENCOUNTER — Emergency Department (INDEPENDENT_AMBULATORY_CARE_PROVIDER_SITE_OTHER)
Admission: EM | Admit: 2014-04-25 | Discharge: 2014-04-25 | Disposition: A | Discharge status: Home / Self Care | Payer: Medicaid Other | Source: Home / Self Care | Attending: Family Medicine | Admitting: Family Medicine

## 2014-04-25 DIAGNOSIS — S92109A Unspecified fracture of unspecified talus, initial encounter for closed fracture: Secondary | ICD-10-CM

## 2014-04-25 DIAGNOSIS — S92151A Displaced avulsion fracture (chip fracture) of right talus, initial encounter for closed fracture: Secondary | ICD-10-CM

## 2014-04-25 MED ORDER — ACCU-CHEK SOFTCLIX LANCET DEV MISC: Status: DC

## 2014-04-25 MED ORDER — ACCU-CHEK AVIVA PLUS W/DEVICE KIT: PACK | Status: DC

## 2014-04-25 MED ORDER — GLUCOSE BLOOD VI STRP: ORAL_STRIP | Status: DC

## 2014-04-25 NOTE — ED Provider Notes (Signed)
CSN: [id number]     Arrival date & time 04/25/14  1258 History   First MD Initiated Contact with Patient 04/25/14 1426     Chief Complaint  Patient presents with  . Fall  . Ankle Pain   (Consider location/radiation/quality/duration/timing/severity/associated sxs/prior Treatment) Patient is a 43 y.o. male presenting with ankle pain.  Ankle Pain Location:  Ankle Time since incident:  2 days Injury: yes   Mechanism of injury: fall   Fall:    Fall occurred:  Tripped   Impact surface:  Product manager of impact:  Unable to specify   Entrapped after fall: no   Ankle location:  R ankle Pain details:    Quality:  Throbbing   Radiates to:  Does not radiate   Severity:  Moderate   Onset quality:  Sudden   Duration:  2 days   Timing:  Constant Chronicity:  New Foreign body present:  No foreign bodies Associated symptoms: no fever     Past Medical History  Diagnosis Date  . Asthma   . Depression   . Diabetes   . Headache(784.0)     frequent  . Migraines   . History of blood clots   . Hyperlipidemia   . Obesity   . Sleep apnea   . DVT (deep venous thrombosis), hx of recurrent 05/23/2012  . History of blood clot in brain, 2012 - followed by [facility] 05/23/2012  . Seizure disorder - followed by [facility] 05/23/2012  . Hyperlipemia 08/25/2012  . Anxiety   . Tuberculosis   . Pneumonia   . Kidney disease   . Kidney stones   . Seizure    Past Surgical History  Procedure Laterality Date  . Brain surgery    . Filter for blood clots     Family History  Problem Relation Age of Onset  . Hyperlipidemia      parent  . Heart disease      parent  . Stroke      parent  . Diabetes      grandparent /parent  . Obesity Other   . Heart attack Other    History  Substance Use Topics  . Smoking status: Former Research scientist (life sciences)  . Smokeless tobacco: Not on file  . Alcohol Use: No    Review of Systems  Constitutional: Negative for fever and chills.  Eyes: Negative for  visual disturbance.  Musculoskeletal: Positive for joint swelling.  Neurological: Positive for headaches (chronic and have remained unchanged). Negative for facial asymmetry, speech difficulty, weakness, light-headedness and numbness.    Allergies  Penicillins  Home Medications   Prior to Admission medications   Medication Sig Start Date End Date Taking? Authorizing Provider  Blood Glucose Monitoring Suppl (ACCU-CHEK AVIVA PLUS) W/DEVICE KIT Check blood sugar TID & QHS 04/25/14   Lorayne Marek, MD  bromocriptine (PARLODEL) 2.5 MG tablet Take 0.5 tablets (1.25 mg total) by mouth daily. 11/21/13   Renato Shin, MD  busPIRone (BUSPAR) 15 MG tablet Take 15 mg by mouth 1 day or 1 dose.    Historical Provider, MD  Canagliflozin (INVOKANA) 300 MG TABS Take 1 tablet (300 mg total) by mouth daily. 12/19/13   Renato Shin, MD  clonazePAM (KLONOPIN) 0.5 MG tablet Take 0.5 mg by mouth 2 (two) times daily as needed for anxiety.    Historical Provider, MD  enoxaparin (LOVENOX) 150 MG/ML injection Inject 1 mL (150 mg total) into the skin every 12 (twelve) hours. For blood clots 08/07/13  Lorayne Marek, MD  gabapentin (NEURONTIN) 300 MG capsule Take 1 capsule (300 mg total) by mouth 3 (three) times daily. 10/01/13   Benjamine Mola, FNP  glucose blood (ACCU-CHEK AVIVA PLUS) test strip Use as instructed 04/25/14   Lorayne Marek, MD  glucose blood (ACCU-CHEK AVIVA) test strip Use as instructed 04/25/14   Lorayne Marek, MD  glyBURIDE (DIABETA) 1.25 MG tablet Take 1 tablet (1.25 mg total) by mouth daily with breakfast. 03/26/14   Renato Shin, MD  Lancet Devices [facility]) lancets Use as instructed 04/25/14   Lorayne Marek, MD  levETIRAcetam (KEPPRA) 500 MG tablet TAKE 1 TABLET BY MOUTH 2 TIMES DAILY. 03/26/14   Lorayne Marek, MD  metFORMIN (GLUCOPHAGE) 1000 MG tablet TAKE 1 TABLET BY MOUTH 2 TIMES DAILY WITH A MEAL. FOR DIABETES MANAGEMENT 03/26/14   Lorayne Marek, MD  pioglitazone (ACTOS) 45 MG tablet Take 1  tablet (45 mg total) by mouth daily. 03/26/14   Renato Shin, MD  simvastatin (ZOCOR) 40 MG tablet TAKE 2 TABLETS BY MOUTH DAILY. 02/19/14   Lorayne Marek, MD  simvastatin (ZOCOR) 80 MG tablet Take 1 tablet (80 mg total) by mouth daily. 01/17/14   Lorayne Marek, MD  sitaGLIPtin (JANUVIA) 100 MG tablet Take 1 tablet (100 mg total) by mouth daily. 12/19/13   Renato Shin, MD  traZODone (DESYREL) 150 MG tablet Take 150 mg by mouth at bedtime.    Historical Provider, MD  Vilazodone HCl (VIIBRYD) 40 MG TABS Take 1 tablet (40 mg total) by mouth daily. 10/01/13   Elyse Jarvis Withrow, FNP   BP 126/81  Pulse 79  Temp(Src) 98.2 F (36.8 C) (Oral)  Resp 18  SpO2 97% Physical Exam  Constitutional: He is oriented to person, place, and time. He appears well-developed and well-nourished.  Cardiovascular:  Pulses:      Dorsalis pedis pulses are 2+ on the right side, and 2+ on the left side.  Pulmonary/Chest: Effort normal.  Musculoskeletal:       Right foot: He exhibits decreased range of motion (dorseflexion and external rotation limited by pain), tenderness (along lateral malleolus) and swelling. He exhibits normal capillary refill, no crepitus, no deformity and no laceration.  Neurological: He is alert and oriented to person, place, and time. He has normal strength and normal reflexes. No cranial nerve deficit or sensory deficit. Coordination and gait normal.  Skin: Skin is warm and dry. Bruising (located on fibular aspect of right ankle and foot) noted.    ED Course  Procedures (including critical care time) Labs Review Labs Reviewed - No data to display  Imaging Review Dg Ankle Complete Right  04/25/2014   CLINICAL DATA:  Twisting injury 2 days ago with increasing pain and swelling with lateral bruising; history of previous severe sprain of the ankle  EXAM: RIGHT ANKLE - COMPLETE 3+ VIEW  COMPARISON:  None.  FINDINGS: The ankle joint mortise is preserved. The talar dome is intact. There is bony density  adjacent to the lateral aspect of the talus consistent with avulsion fracture fragments. The malleoli are intact. There is soft tissue swelling anteriorly and laterally.  IMPRESSION: There is an avulsion from the lateral cortex of the talus. No acute malleolar fracture is demonstrated and there is no dislocation.   Electronically Signed   By: David  Martinique   On: 04/25/2014 15:00     MDM   1. Avulsion fracture of talus, right, closed, initial encounter    Patient with small avulsion fracture of talus on x-ray.  Patient declined analgesics in office. Cam walker fitted on patient. Instructions for follow-up with orthopedic surgery next week. Does not appear to be a surgical fracture. Patient to continue using already prescribed hydrocodone for pain. Patient not showing signs of intracranial bleeding. Already two days out from injury. Return precautions discussed with patient. Stable for discharge home.    Cordelia Poche, MD 04/25/14 [id number]

## 2014-04-25 NOTE — Discharge Instructions (Signed)
Ankle Fracture  A fracture is a break in a bone. The ankle joint is made up of three bones. These include the lower (distal)sections of your lower leg bones, called the tibia and fibula, along with a bone in your foot, called the talus. Depending on how bad the break is and if more than one ankle joint bone is broken, a cast or splint is used to protect and keep your injured bone from moving while it heals. Sometimes, surgery is required to help the fracture heal properly.   There are two general types of fractures:   Stable fracture. This includes a single fracture line through one bone, with no injury to ankle ligaments. A fracture of the talus that does not have any displacement (movement of the bone on either side of the fracture line) is also stable.   Unstable fracture. This includes more than one fracture line through one or more bones in the ankle joint. It also includes fractures that have displacement of the bone on either side of the fracture line.  CAUSES   A direct blow to the ankle.    Quickly and severely twisting your ankle.   Trauma, such as a car accident or falling from a significant height.  RISK FACTORS  You may be at a higher risk of ankle fracture if:   You have certain medical conditions.   You are involved in high-impact sports.   You are involved in a high-impact car accident.  SIGNS AND SYMPTOMS    Tender and swollen ankle.   Bruising around the injured ankle.   Pain on movement of the ankle.   Difficulty walking or putting weight on the ankle.   A cold foot below the site of the ankle injury. This can occur if the blood vessels passing through your injured ankle were also damaged.   Numbness in the foot below the site of the ankle injury.  DIAGNOSIS   An ankle fracture is usually diagnosed with a physical exam and X-rays. A CT scan may also be required for complex fractures.  TREATMENT   Stable fractures are treated with a cast or splint and using crutches to avoid putting  weight on your injured ankle. This is followed by an ankle strengthening program. Some patients require a special type of cast, depending on other medical problems they may have. Unstable fractures require surgery to ensure the bones heal properly. Your health care provider will tell you what type of fracture you have and the best treatment for your condition.  HOME CARE INSTRUCTIONS    Review correct crutch use with your health care provider and use your crutches as directed. Safe use of crutches is extremely important. Misuse of crutches can cause you to fall or cause injury to nerves in your hands or armpits.   Do not put weight or pressure on the injured ankle until directed by your health care provider.   To lessen the swelling, keep the injured leg elevated while sitting or lying down.   Apply ice to the injured area:   Put ice in a plastic bag.   Place a towel between your cast and the bag.   Leave the ice on for 20 minutes, 2-3 times a day.   If you have a plaster or fiberglass cast:   Do not try to scratch the skin under the cast with any objects. This can increase your risk of skin infection.   Check the skin around the cast every day. You   may put lotion on any red or sore areas.   Keep your cast dry and clean.   If you have a plaster splint:   Wear the splint as directed.   You may loosen the elastic around the splint if your toes become numb, tingle, or turn cold or blue.   Do not put pressure on any part of your cast or splint; it may break. Rest your cast only on a pillow the first 24 hours until it is fully hardened.   Your cast or splint can be protected during bathing with a plastic bag sealed to your skin with medical tape. Do not lower the cast or splint into water.   Take medicines as directed by your health care provider. Only take over-the-counter or prescription medicines for pain, discomfort, or fever as directed by your health care provider.   Do not drive a vehicle until  your health care provider specifically tells you it is safe to do so.   If your health care provider has given you a follow-up appointment, it is very important to keep that appointment. Not keeping the appointment could result in a chronic or permanent injury, pain, and disability. If you have any problem keeping the appointment, call the facility for assistance.  SEEK MEDICAL CARE IF:  You develop increased swelling or discomfort.  SEEK IMMEDIATE MEDICAL CARE IF:    Your cast gets damaged or breaks.   You have continued severe pain.   You develop new pain or swelling after the cast was put on.   Your skin or toenails below the injury turn blue or gray.   Your skin or toenails below the injury feel cold, numb, or have loss of sensitivity to touch.   There is a bad smell or pus draining from under the cast.  MAKE SURE YOU:    Understand these instructions.   Will watch your condition.   Will get help right away if you are not doing well or get worse.  Document Released: 07/16/2000 Document Revised: 07/24/2013 Document Reviewed: 02/15/2013  [employer] Patient Information 2015 [employer]. This information is not intended to replace advice given to you by your health care provider. Make sure you discuss any questions you have with your health care provider.

## 2014-04-25 NOTE — ED Notes (Signed)
Reports falling on Tuesday 9/22.  Patient was walking on uneven ground, slipped and fell.  Pain in right ankle, swelling and visible bruising, pedal pulse 2 plus.  Patient has soreness in left upper arm and abrasion to left knee

## 2014-04-27 NOTE — ED Provider Notes (Signed)
I saw this patient with Dr. Teryl Lucy. I agree with the note.   Medical screening examination/treatment/procedure(s) were performed by a resident physician or non-physician practitioner and as the supervising physician I was immediately available for consultation/collaboration.  Lynne Leader, MD    Gregor Hams, MD 04/27/14 [phone]

## 2014-05-01 ENCOUNTER — Ambulatory Visit: Payer: Medicaid Other | Attending: Internal Medicine

## 2014-05-01 DIAGNOSIS — Z23 Encounter for immunization: Secondary | ICD-10-CM

## 2014-05-02 ENCOUNTER — Other Ambulatory Visit: Payer: Self-pay

## 2014-05-02 MED ORDER — SIMVASTATIN 80 MG PO TABS: 80.0000 mg | ORAL_TABLET | Freq: Every day | ORAL | Status: DC

## 2014-05-02 MED ORDER — LEVETIRACETAM 500 MG PO TABS: ORAL_TABLET | ORAL | Status: DC

## 2014-05-21 ENCOUNTER — Encounter: Payer: Self-pay | Admitting: Internal Medicine

## 2014-05-21 ENCOUNTER — Ambulatory Visit: Payer: Medicaid Other | Attending: Internal Medicine | Admitting: Internal Medicine

## 2014-05-21 VITALS — BP 128/84 | HR 68 | Temp 98.0°F | Resp 16

## 2014-05-21 DIAGNOSIS — Z87898 Personal history of other specified conditions: Secondary | ICD-10-CM | POA: Diagnosis not present

## 2014-05-21 DIAGNOSIS — G43909 Migraine, unspecified, not intractable, without status migrainosus: Secondary | ICD-10-CM | POA: Diagnosis present

## 2014-05-21 DIAGNOSIS — E785 Hyperlipidemia, unspecified: Secondary | ICD-10-CM | POA: Insufficient documentation

## 2014-05-21 DIAGNOSIS — G43009 Migraine without aura, not intractable, without status migrainosus: Secondary | ICD-10-CM | POA: Insufficient documentation

## 2014-05-21 DIAGNOSIS — Z87891 Personal history of nicotine dependence: Secondary | ICD-10-CM | POA: Insufficient documentation

## 2014-05-21 DIAGNOSIS — R569 Unspecified convulsions: Secondary | ICD-10-CM | POA: Insufficient documentation

## 2014-05-21 DIAGNOSIS — R0981 Nasal congestion: Secondary | ICD-10-CM | POA: Diagnosis not present

## 2014-05-21 DIAGNOSIS — Z8669 Personal history of other diseases of the nervous system and sense organs: Secondary | ICD-10-CM

## 2014-05-21 DIAGNOSIS — H538 Other visual disturbances: Secondary | ICD-10-CM | POA: Diagnosis not present

## 2014-05-21 DIAGNOSIS — Z86718 Personal history of other venous thrombosis and embolism: Secondary | ICD-10-CM | POA: Insufficient documentation

## 2014-05-21 DIAGNOSIS — E139 Other specified diabetes mellitus without complications: Secondary | ICD-10-CM | POA: Diagnosis not present

## 2014-05-21 DIAGNOSIS — Z7901 Long term (current) use of anticoagulants: Secondary | ICD-10-CM | POA: Diagnosis not present

## 2014-05-21 DIAGNOSIS — G40909 Epilepsy, unspecified, not intractable, without status epilepticus: Secondary | ICD-10-CM

## 2014-05-21 LAB — GLUCOSE, POCT (MANUAL RESULT ENTRY): POC Glucose: 172 mg/dl — AB (ref 70–99)

## 2014-05-21 MED ORDER — NAPROXEN 500 MG PO TABS: 500.0000 mg | ORAL_TABLET | Freq: Two times a day (BID) | ORAL | Status: DC

## 2014-05-21 MED ORDER — FLUTICASONE PROPIONATE 50 MCG/ACT NA SUSP: 2.0000 | Freq: Every day | NASAL | Status: DC

## 2014-05-21 NOTE — Progress Notes (Signed)
Pt is here c/o frequent headaches. Pt states that he is feeling very tired lately w/ blurry vision. Pt is requesting a MRI.

## 2014-05-21 NOTE — Progress Notes (Signed)
MRN: [id number] Name: Bryan Mcmillan  Sex: male Age: 43 y.o. DOB: 10-26-1970  Allergies: Penicillins  Chief Complaint  Patient presents with  . Follow-up    HPI: Patient is 43 y.o. male who has history of seizure disorder, migraine headaches, diabetes comes today reported to have on and off headache sometimes which gets severe as per patient when he takes ibuprofen or Aleve it helps him with the symptoms denies any new numbness weakness, patient has lost to followup with the neurologist he is on Keppra and Neurontin denies any recent seizures, does complain of some nasal congestion denies any fever chills coughing, patient also reported to have history of OSA and has not had any recent sleep study done. Patient also follows her with endocrinologist and is already scheduled appointment next month.  Past Medical History  Diagnosis Date  . Asthma   . Depression   . Diabetes   . Headache(784.0)     frequent  . Migraines   . History of blood clots   . Hyperlipidemia   . Obesity   . Sleep apnea   . DVT (deep venous thrombosis), hx of recurrent 05/23/2012  . History of blood clot in brain, 2012 - followed by [facility] 05/23/2012  . Seizure disorder - followed by [facility] 05/23/2012  . Hyperlipemia 08/25/2012  . Anxiety   . Tuberculosis   . Pneumonia   . Kidney disease   . Kidney stones   . Seizure     Past Surgical History  Procedure Laterality Date  . Brain surgery    . Filter for blood clots        Medication List       This list is accurate as of: 05/21/14 11:24 AM.  Always use your most recent med list.               ACCU-CHEK AVIVA PLUS W/DEVICE Kit  Check blood sugar TID & QHS     accu-chek softclix lancets  Use as instructed     bromocriptine 2.5 MG tablet  Commonly known as:  PARLODEL  Take 0.5 tablets (1.25 mg total) by mouth daily.     busPIRone 15 MG tablet  Commonly known as:  BUSPAR  Take 15 mg by mouth 1 day or 1 dose.     Canagliflozin 300 MG Tabs  Commonly known as:  INVOKANA  Take 1 tablet (300 mg total) by mouth daily.     clonazePAM 0.5 MG tablet  Commonly known as:  KLONOPIN  Take 0.5 mg by mouth 2 (two) times daily as needed for anxiety.     enoxaparin 150 MG/ML injection  Commonly known as:  LOVENOX  Inject 1 mL (150 mg total) into the skin every 12 (twelve) hours. For blood clots     fluticasone 50 MCG/ACT nasal spray  Commonly known as:  FLONASE  Place 2 sprays into both nostrils daily.     gabapentin 300 MG capsule  Commonly known as:  NEURONTIN  Take 1 capsule (300 mg total) by mouth 3 (three) times daily.     glucose blood test strip  Commonly known as:  ACCU-CHEK AVIVA  Use as instructed     glucose blood test strip  Commonly known as:  ACCU-CHEK AVIVA PLUS  Use as instructed     glyBURIDE 1.25 MG tablet  Commonly known as:  DIABETA  Take 1 tablet (1.25 mg total) by mouth daily with breakfast.     levETIRAcetam 500 MG tablet  Commonly known as:  KEPPRA  TAKE 1 TABLET BY MOUTH 2 TIMES DAILY.     metFORMIN 1000 MG tablet  Commonly known as:  GLUCOPHAGE  TAKE 1 TABLET BY MOUTH 2 TIMES DAILY WITH A MEAL. FOR DIABETES MANAGEMENT     naproxen 500 MG tablet  Commonly known as:  NAPROSYN  Take 1 tablet (500 mg total) by mouth 2 (two) times daily with a meal.     pioglitazone 45 MG tablet  Commonly known as:  ACTOS  Take 1 tablet (45 mg total) by mouth daily.     simvastatin 40 MG tablet  Commonly known as:  ZOCOR  TAKE 2 TABLETS BY MOUTH DAILY.     simvastatin 80 MG tablet  Commonly known as:  ZOCOR  Take 1 tablet (80 mg total) by mouth daily.     sitaGLIPtin 100 MG tablet  Commonly known as:  JANUVIA  Take 1 tablet (100 mg total) by mouth daily.     traZODone 150 MG tablet  Commonly known as:  DESYREL  Take 150 mg by mouth at bedtime.     Vilazodone HCl 40 MG Tabs  Commonly known as:  VIIBRYD  Take 1 tablet (40 mg total) by mouth daily.        Meds ordered  this encounter  Medications  . naproxen (NAPROSYN) 500 MG tablet    Sig: Take 1 tablet (500 mg total) by mouth 2 (two) times daily with a meal.    Dispense:  30 tablet    Refill:  2  . fluticasone (FLONASE) 50 MCG/ACT nasal spray    Sig: Place 2 sprays into both nostrils daily.    Dispense:  16 g    Refill:  6    Immunization History  Administered Date(s) Administered  . Influenza Whole 05/02/2012  . Influenza,inj,Quad PF,36+ Mos 05/01/2014    Family History  Problem Relation Age of Onset  . Hyperlipidemia      parent  . Heart disease      parent  . Stroke      parent  . Diabetes      grandparent /parent  . Obesity Other   . Heart attack Other     History  Substance Use Topics  . Smoking status: Former Research scientist (life sciences)  . Smokeless tobacco: Not on file  . Alcohol Use: No    Review of Systems   As noted in HPI  Filed Vitals:   05/21/14 1114  BP: 128/84  Pulse: 68  Temp: 98 F (36.7 C)  Resp: 16    Physical Exam  Physical Exam  Constitutional: He is oriented to person, place, and time. No distress.  HENT:  Nasal congestion no sinus tenderness   Eyes: EOM are normal. Pupils are equal, round, and reactive to light.  Cardiovascular: Normal rate and regular rhythm.   Pulmonary/Chest: Breath sounds normal. No respiratory distress. He has no wheezes. He has no rales.  Neurological: He is alert and oriented to person, place, and time.    CBC    Component Value Date/Time   WBC 8.5 09/23/2013 1355   RBC 5.16 09/23/2013 1355   HGB 15.2 09/23/2013 1355   HCT 44.2 09/23/2013 1355   PLT 310 09/23/2013 1355   MCV 85.7 09/23/2013 1355   LYMPHSABS 3.6 07/11/2013 1808   MONOABS 0.6 07/11/2013 1808   EOSABS 0.1 07/11/2013 1808   BASOSABS 0.0 07/11/2013 1808    CMP     Component Value Date/Time   NA  137 09/23/2013 1355   K 4.2 09/23/2013 1355   CL 97 09/23/2013 1355   CO2 21 09/23/2013 1355   GLUCOSE 284* 09/23/2013 1355   BUN 14 09/23/2013 1355   CREATININE 0.86  09/23/2013 1355   CALCIUM 9.9 09/23/2013 1355   PROT 7.7 09/23/2013 1355   ALBUMIN 4.2 09/23/2013 1355   AST 30 09/23/2013 1355   ALT 53 09/23/2013 1355   ALKPHOS 69 09/23/2013 1355   BILITOT 0.2* 09/23/2013 1355   GFRNONAA >90 09/23/2013 1355   GFRAA >90 09/23/2013 1355    Lab Results  Component Value Date/Time   CHOL 141 09/26/2013  6:20 AM    No components found with this basename: hga1c    Lab Results  Component Value Date/Time   AST 30 09/23/2013  1:55 PM    Assessment and Plan  Other specified diabetes mellitus without complications - Plan: Glucose (CBG) patient is scheduled to follow with endocrinologist he will continue with current medications.  Migraine without aura and without status migrainosus, not intractable - Plan: Examination is benign, prescribed naproxen (NAPROSYN) 500 MG tablet, Ambulatory referral to Neurology  History of sleep apnea - Plan: Ambulatory referral to Sleep Studies  Nasal congestion - Plan: fluticasone (FLONASE) 50 MCG/ACT nasal spray  Seizure disorder - Plan: Currently Keppra and Neurontin Ambulatory referral to Neurology   Health Maintenance   uptodate with flu shot   Return in about 3 months (around 08/21/2014).  Lorayne Marek, MD

## 2014-06-04 ENCOUNTER — Encounter: Payer: Self-pay | Admitting: Diagnostic Neuroimaging

## 2014-06-04 ENCOUNTER — Ambulatory Visit (INDEPENDENT_AMBULATORY_CARE_PROVIDER_SITE_OTHER): Payer: Medicaid Other | Admitting: Diagnostic Neuroimaging

## 2014-06-04 VITALS — BP 106/64 | HR 87 | Temp 96.9°F | Ht 70.0 in | Wt 280.2 lb

## 2014-06-04 DIAGNOSIS — R930 Abnormal findings on diagnostic imaging of skull and head, not elsewhere classified: Secondary | ICD-10-CM

## 2014-06-04 DIAGNOSIS — R9089 Other abnormal findings on diagnostic imaging of central nervous system: Secondary | ICD-10-CM

## 2014-06-04 DIAGNOSIS — R569 Unspecified convulsions: Secondary | ICD-10-CM

## 2014-06-04 NOTE — Patient Instructions (Signed)
Continue current medications. 
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
## 2014-06-12 ENCOUNTER — Other Ambulatory Visit: Payer: Self-pay | Admitting: Internal Medicine

## 2014-06-24 ENCOUNTER — Ambulatory Visit: Payer: Self-pay | Admitting: Endocrinology

## 2014-06-25 ENCOUNTER — Encounter: Payer: Self-pay | Admitting: Endocrinology

## 2014-06-25 ENCOUNTER — Ambulatory Visit (INDEPENDENT_AMBULATORY_CARE_PROVIDER_SITE_OTHER): Payer: Medicaid Other | Admitting: Endocrinology

## 2014-06-25 VITALS — BP 130/80 | HR 86 | Temp 97.9°F | Ht 71.0 in | Wt 280.0 lb

## 2014-06-25 DIAGNOSIS — IMO0002 Reserved for concepts with insufficient information to code with codable children: Secondary | ICD-10-CM

## 2014-06-25 DIAGNOSIS — E1165 Type 2 diabetes mellitus with hyperglycemia: Secondary | ICD-10-CM

## 2014-06-25 MED ORDER — REPAGLINIDE 1 MG PO TABS: 1.0000 mg | ORAL_TABLET | Freq: Three times a day (TID) | ORAL | Status: DC
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
## 2014-06-25 NOTE — Patient Instructions (Signed)
check your blood sugar once a day.  vary the time of day when you check, between before the 3 meals, and at bedtime.  also check if you have symptoms of your blood sugar being too high or too low.  please keep a record of the readings and bring it to your next appointment here.  You can write it on any piece of paper.  please call us sooner if your blood sugar goes below 70, or if you have a lot of readings over 200.   blood tests are being requested for you today.  We'll contact you with results.  Please stop taking the glyburide and pioglitizone. With time, your blood sugar will increase.  When that happens, start taking repaglinide.  i have sent a prescription to your pharmacy. Please come back for a follow-up appointment in 3 months.

## 2014-07-20 ENCOUNTER — Emergency Department (HOSPITAL_COMMUNITY)
Admission: EM | Admit: 2014-07-20 | Discharge: 2014-07-20 | Disposition: A | Discharge status: Home / Self Care | Payer: Medicaid Other | Attending: Emergency Medicine | Admitting: Emergency Medicine

## 2014-07-20 ENCOUNTER — Emergency Department (HOSPITAL_COMMUNITY): Payer: Medicaid Other

## 2014-07-20 ENCOUNTER — Encounter (HOSPITAL_COMMUNITY): Payer: Self-pay | Admitting: Emergency Medicine

## 2014-07-20 DIAGNOSIS — G43909 Migraine, unspecified, not intractable, without status migrainosus: Secondary | ICD-10-CM | POA: Diagnosis not present

## 2014-07-20 DIAGNOSIS — Z794 Long term (current) use of insulin: Secondary | ICD-10-CM | POA: Insufficient documentation

## 2014-07-20 DIAGNOSIS — E785 Hyperlipidemia, unspecified: Secondary | ICD-10-CM | POA: Diagnosis not present

## 2014-07-20 DIAGNOSIS — Z87442 Personal history of urinary calculi: Secondary | ICD-10-CM | POA: Diagnosis not present

## 2014-07-20 DIAGNOSIS — Z86718 Personal history of other venous thrombosis and embolism: Secondary | ICD-10-CM | POA: Diagnosis not present

## 2014-07-20 DIAGNOSIS — G40909 Epilepsy, unspecified, not intractable, without status epilepticus: Secondary | ICD-10-CM | POA: Insufficient documentation

## 2014-07-20 DIAGNOSIS — J45901 Unspecified asthma with (acute) exacerbation: Secondary | ICD-10-CM | POA: Diagnosis not present

## 2014-07-20 DIAGNOSIS — Z87448 Personal history of other diseases of urinary system: Secondary | ICD-10-CM | POA: Diagnosis not present

## 2014-07-20 DIAGNOSIS — Z8611 Personal history of tuberculosis: Secondary | ICD-10-CM | POA: Diagnosis not present

## 2014-07-20 DIAGNOSIS — F419 Anxiety disorder, unspecified: Secondary | ICD-10-CM | POA: Insufficient documentation

## 2014-07-20 DIAGNOSIS — F329 Major depressive disorder, single episode, unspecified: Secondary | ICD-10-CM | POA: Insufficient documentation

## 2014-07-20 DIAGNOSIS — Z87891 Personal history of nicotine dependence: Secondary | ICD-10-CM | POA: Diagnosis not present

## 2014-07-20 DIAGNOSIS — E119 Type 2 diabetes mellitus without complications: Secondary | ICD-10-CM | POA: Diagnosis not present

## 2014-07-20 DIAGNOSIS — E669 Obesity, unspecified: Secondary | ICD-10-CM | POA: Diagnosis not present

## 2014-07-20 DIAGNOSIS — Z7951 Long term (current) use of inhaled steroids: Secondary | ICD-10-CM | POA: Diagnosis not present

## 2014-07-20 DIAGNOSIS — R0600 Dyspnea, unspecified: Secondary | ICD-10-CM

## 2014-07-20 DIAGNOSIS — R0602 Shortness of breath: Secondary | ICD-10-CM | POA: Diagnosis present

## 2014-07-20 DIAGNOSIS — Z880 Allergy status to penicillin: Secondary | ICD-10-CM | POA: Diagnosis not present

## 2014-07-20 DIAGNOSIS — Z79899 Other long term (current) drug therapy: Secondary | ICD-10-CM | POA: Diagnosis not present

## 2014-07-20 LAB — CBG MONITORING, ED: Glucose-Capillary: 94 mg/dL (ref 70–99)

## 2014-07-20 MED ORDER — ALBUTEROL SULFATE (2.5 MG/3ML) 0.083% IN NEBU
5.0000 mg | INHALATION_SOLUTION | Freq: Once | RESPIRATORY_TRACT | Status: AC
  Administered 2014-07-20: 5 mg via RESPIRATORY_TRACT
  Filled 2014-07-20: qty 6

## 2014-07-20 MED ORDER — ALBUTEROL SULFATE HFA 108 (90 BASE) MCG/ACT IN AERS: 2.0000 | INHALATION_SPRAY | RESPIRATORY_TRACT | Status: DC | PRN

## 2014-07-20 NOTE — Discharge Instructions (Signed)
USE THE INHALER EVERY 4 HOURS AS NEEDED FOR SYMPTOMS OF SHORTNESS OF BREATH. IT IS IMPORTANT THAT YOU TAKE THE LOVENOX AS PRESCRIBED FOR TREATMENT OF PULMONARY EMBOLISM. RETURN HERE WITH ANY CHEST PAIN, WORSENED SHORTNESS OF BREATH, FEVER OR NEW SYMPTOM OF CONCERN.

## 2014-07-20 NOTE — ED Notes (Signed)
Notified RN,Lilibeth pt. CBG 94.

## 2014-07-20 NOTE — ED Notes (Deleted)
Patient called EMS saying he was having seizures.

## 2014-07-20 NOTE — ED Provider Notes (Signed)
CSN: [id number]     Arrival date & time 07/20/14  0111 History   First MD Initiated Contact with Patient 07/20/14 0421     Chief Complaint  Patient presents with  . Shortness of Breath     (Consider location/radiation/quality/duration/timing/severity/associated sxs/prior Treatment) Patient is a 43 y.o. male presenting with shortness of breath. The history is provided by the patient. No language interpreter was used.  Shortness of Breath Severity:  Moderate Associated symptoms: no chest pain, no fever and no vomiting   Associated symptoms comment:  The patient complains of shortness of breath that feels like "not getting enough air". No cough or fever. He denies chest pain. There has been some mild nausea without vomiting or abdominal pain.    Past Medical History  Diagnosis Date  . Asthma   . Depression   . Diabetes   . Headache(784.0)     frequent  . Migraines   . History of blood clots   . Hyperlipidemia   . Obesity   . Sleep apnea   . DVT (deep venous thrombosis), hx of recurrent 05/23/2012  . History of blood clot in brain, 2012 - followed by [facility] 05/23/2012  . Seizure disorder - followed by [facility] 05/23/2012  . Hyperlipemia 08/25/2012  . Anxiety   . Tuberculosis   . Pneumonia   . Kidney disease   . Kidney stones   . Seizure   . History of suicidal tendencies    Past Surgical History  Procedure Laterality Date  . Brain surgery    . Filter for blood clots     Family History  Problem Relation Age of Onset  . Hyperlipidemia      parent  . Heart disease      parent  . Stroke      parent  . Diabetes      grandparent /parent  . Obesity Other   . Heart attack Other    History  Substance Use Topics  . Smoking status: Former Research scientist (life sciences)  . Smokeless tobacco: Not on file  . Alcohol Use: No    Review of Systems  Constitutional: Negative for fever and chills.  HENT: Negative.   Respiratory: Positive for shortness of breath.   Cardiovascular:  Negative.  Negative for chest pain.  Gastrointestinal: Negative.  Negative for nausea and vomiting.  Genitourinary: Negative.   Musculoskeletal: Negative.   Skin: Negative.   Neurological: Negative.       Allergies  Penicillins  Home Medications   Prior to Admission medications   Medication Sig Start Date End Date Taking? Authorizing Provider  ABILIFY 5 MG tablet Take 5 mg by mouth daily.  04/18/14  Yes Historical Provider, MD  bromocriptine (PARLODEL) 2.5 MG tablet Take 0.5 tablets (1.25 mg total) by mouth daily. 11/21/13  Yes Renato Shin, MD  busPIRone (BUSPAR) 10 MG tablet Take 10 mg by mouth 2 (two) times daily.  04/18/14  Yes Historical Provider, MD  Canagliflozin (INVOKANA) 300 MG TABS Take 1 tablet (300 mg total) by mouth daily. 12/19/13  Yes Renato Shin, MD  clonazePAM (KLONOPIN) 0.5 MG tablet Take 0.5 mg by mouth 2 (two) times daily as needed for anxiety.   Yes Historical Provider, MD  enoxaparin (LOVENOX) 150 MG/ML injection Inject 1 mL (150 mg total) into the skin every 12 (twelve) hours. For blood clots 08/07/13  Yes Deepak Advani, MD  FARXIGA 10 MG TABS tablet Take 10 mg by mouth daily.  03/25/14  Yes Historical Provider, MD  fluticasone (FLONASE) 50 MCG/ACT nasal spray Place 2 sprays into both nostrils daily. 05/21/14  Yes Lorayne Marek, MD  gabapentin (NEURONTIN) 300 MG capsule Take 1 capsule (300 mg total) by mouth 3 (three) times daily. Patient taking differently: Take 300 mg by mouth 4 (four) times daily.  10/01/13  Yes Benjamine Mola, FNP  levETIRAcetam (KEPPRA) 500 MG tablet TAKE 1 TABLET BY MOUTH 2 TIMES DAILY. 06/18/14  Yes Deepak Advani, MD  metFORMIN (GLUCOPHAGE) 1000 MG tablet TAKE 1 TABLET BY MOUTH 2 TIMES DAILY WITH A MEAL. FOR DIABETES MANAGEMENT 03/26/14  Yes Lorayne Marek, MD  naproxen (NAPROSYN) 500 MG tablet Take 1 tablet (500 mg total) by mouth 2 (two) times daily with a meal. 05/21/14  Yes Deepak Advani, MD  repaglinide (PRANDIN) 1 MG tablet Take 1 tablet (1 mg  total) by mouth 3 (three) times daily before meals. 06/25/14  Yes Renato Shin, MD  simvastatin (ZOCOR) 80 MG tablet Take 80 mg by mouth daily.  05/15/14  Yes Historical Provider, MD  Vilazodone HCl (VIIBRYD) 40 MG TABS Take 1 tablet (40 mg total) by mouth daily. 10/01/13  Yes Benjamine Mola, FNP  ACCU-CHEK FASTCLIX LANCETS MISC  04/25/14   Historical Provider, MD  Blood Glucose Monitoring Suppl (ACCU-CHEK AVIVA PLUS) W/DEVICE KIT Check blood sugar TID & QHS 04/25/14   Lorayne Marek, MD  glucose blood (ACCU-CHEK AVIVA PLUS) test strip Use as instructed 04/25/14   Lorayne Marek, MD  Lancets Misc. (ACCU-CHEK FASTCLIX LANCET) KIT  04/25/14   Historical Provider, MD   BP 113/82 mmHg  Pulse 79  Temp(Src) 97.9 F (36.6 C) (Oral)  Resp 16  Ht 5' 11"  (1.803 m)  Wt 275 lb (124.739 kg)  BMI 38.37 kg/m2  SpO2 98% Physical Exam  Constitutional: He is oriented to person, place, and time. He appears well-developed and well-nourished.  HENT:  Head: Normocephalic.  Neck: Normal range of motion. Neck supple.  Cardiovascular: Normal rate and regular rhythm.   Pulmonary/Chest: Effort normal and breath sounds normal. He has no wheezes. He has no rales. He exhibits no tenderness.  Abdominal: Soft. Bowel sounds are normal. There is no tenderness. There is no rebound and no guarding.  Musculoskeletal: Normal range of motion.  Neurological: He is alert and oriented to person, place, and time.  Skin: Skin is warm and dry. No rash noted.  Psychiatric: He has a normal mood and affect.    ED Course  Procedures (including critical care time) Labs Review Labs Reviewed  CBG MONITORING, ED  CBG MONITORING, ED   Results for orders placed or performed during the hospital encounter of 07/20/14  POC CBG, ED  Result Value Ref Range   Glucose-Capillary 94 70 - 99 mg/dL   Comment 1 Notify RN    Dg Chest 2 View  07/20/2014   CLINICAL DATA:  Acute onset of shortness of breath for 24 hours. Current history of moderate  asthma. Personal history of smoking. Initial encounter.  EXAM: CHEST  2 VIEW  COMPARISON:  Chest radiograph performed 09/23/2013  FINDINGS: The lungs are well-aerated and clear. There is no evidence of focal opacification, pleural effusion or pneumothorax.  The heart is normal in size; the mediastinal contour is within normal limits. No acute osseous abnormalities are seen.  IMPRESSION: No acute cardiopulmonary process seen.   Electronically Signed   By: Garald Balding M.D.   On: 07/20/2014 05:34    Imaging Review No results found.   EKG Interpretation None  MDM   Final diagnoses:  None    1. Dyspnea  He is feeling better with nebulized breathing treatment. Discussed history of PE with Dr. Kathrynn Humble. He is not tachycardic or hypoxic. He has no pain. He reports he has his medications, specifically, Lovenox and is encouraged to use it as prescribed. Reassured as to today's exam. Stable for discharge home.      Dewaine Oats, PA-C 07/22/14 2302  Varney Biles, MD 08/12/14 1525

## 2014-07-20 NOTE — ED Notes (Signed)
Pt c/o difficulty breathing x 24 hours. Pt states he does not feel like he getting enough air. Pt c/o dizziness, lightheadedness and difficulty concentrating. Pt is due for sleep study January.

## 2014-08-06 ENCOUNTER — Encounter (HOSPITAL_BASED_OUTPATIENT_CLINIC_OR_DEPARTMENT_OTHER): Payer: Medicaid Other

## 2014-08-16 ENCOUNTER — Other Ambulatory Visit: Payer: Self-pay | Admitting: Internal Medicine

## 2014-08-26 ENCOUNTER — Telehealth: Payer: Self-pay | Admitting: Internal Medicine

## 2014-08-26 NOTE — Telephone Encounter (Signed)
Patient came into facility stating that he has been short of breath for about a week and would like to get some advice. Patient was given an appointment for his PCP on 08/28/2014

## 2014-08-27 ENCOUNTER — Ambulatory Visit (HOSPITAL_BASED_OUTPATIENT_CLINIC_OR_DEPARTMENT_OTHER): Payer: Medicaid Other | Attending: Internal Medicine | Admitting: Radiology

## 2014-08-27 VITALS — Ht 71.0 in

## 2014-08-27 DIAGNOSIS — G4733 Obstructive sleep apnea (adult) (pediatric): Secondary | ICD-10-CM | POA: Insufficient documentation

## 2014-08-28 ENCOUNTER — Encounter: Payer: Self-pay | Admitting: Internal Medicine

## 2014-08-28 ENCOUNTER — Ambulatory Visit: Payer: Medicaid Other | Attending: Internal Medicine | Admitting: Internal Medicine

## 2014-08-28 VITALS — BP 131/86 | HR 78 | Temp 98.0°F | Resp 16 | Wt 276.2 lb

## 2014-08-28 DIAGNOSIS — J45909 Unspecified asthma, uncomplicated: Secondary | ICD-10-CM | POA: Insufficient documentation

## 2014-08-28 DIAGNOSIS — E119 Type 2 diabetes mellitus without complications: Secondary | ICD-10-CM | POA: Diagnosis not present

## 2014-08-28 DIAGNOSIS — E785 Hyperlipidemia, unspecified: Secondary | ICD-10-CM | POA: Diagnosis not present

## 2014-08-28 DIAGNOSIS — Z86718 Personal history of other venous thrombosis and embolism: Secondary | ICD-10-CM | POA: Diagnosis not present

## 2014-08-28 DIAGNOSIS — G40909 Epilepsy, unspecified, not intractable, without status epilepticus: Secondary | ICD-10-CM | POA: Diagnosis not present

## 2014-08-28 DIAGNOSIS — Z7901 Long term (current) use of anticoagulants: Secondary | ICD-10-CM | POA: Insufficient documentation

## 2014-08-28 DIAGNOSIS — F419 Anxiety disorder, unspecified: Secondary | ICD-10-CM | POA: Diagnosis not present

## 2014-08-28 DIAGNOSIS — R0602 Shortness of breath: Secondary | ICD-10-CM | POA: Insufficient documentation

## 2014-08-28 DIAGNOSIS — R0981 Nasal congestion: Secondary | ICD-10-CM | POA: Diagnosis not present

## 2014-08-28 DIAGNOSIS — Z87891 Personal history of nicotine dependence: Secondary | ICD-10-CM | POA: Insufficient documentation

## 2014-08-28 DIAGNOSIS — Z7951 Long term (current) use of inhaled steroids: Secondary | ICD-10-CM | POA: Diagnosis not present

## 2014-08-28 DIAGNOSIS — F329 Major depressive disorder, single episode, unspecified: Secondary | ICD-10-CM | POA: Insufficient documentation

## 2014-08-28 DIAGNOSIS — E139 Other specified diabetes mellitus without complications: Secondary | ICD-10-CM

## 2014-08-28 DIAGNOSIS — Z791 Long term (current) use of non-steroidal anti-inflammatories (NSAID): Secondary | ICD-10-CM | POA: Diagnosis not present

## 2014-08-28 DIAGNOSIS — Z87898 Personal history of other specified conditions: Secondary | ICD-10-CM

## 2014-08-28 DIAGNOSIS — Z8669 Personal history of other diseases of the nervous system and sense organs: Secondary | ICD-10-CM

## 2014-08-28 LAB — POCT GLYCOSYLATED HEMOGLOBIN (HGB A1C): Hemoglobin A1C: 7.2

## 2014-08-28 LAB — GLUCOSE, POCT (MANUAL RESULT ENTRY): POC GLUCOSE: 216 mg/dL — AB (ref 70–99)

## 2014-08-28 MED ORDER — FLUTICASONE PROPIONATE 50 MCG/ACT NA SUSP: 2.0000 | Freq: Every day | NASAL | Status: DC

## 2014-08-28 NOTE — Patient Instructions (Signed)
Diabetes Mellitus and Food It is important for you to manage your blood sugar (glucose) level. Your blood glucose level can be greatly affected by what you eat. Eating healthier foods in the appropriate amounts throughout the day at about the same time each day will help you control your blood glucose level. It can also help slow or prevent worsening of your diabetes mellitus. Healthy eating may even help you improve the level of your blood pressure and reach or maintain a healthy weight.  HOW CAN FOOD AFFECT ME? Carbohydrates Carbohydrates affect your blood glucose level more than any other type of food. Your dietitian will help you determine how many carbohydrates to eat at each meal and teach you how to count carbohydrates. Counting carbohydrates is important to keep your blood glucose at a healthy level, especially if you are using insulin or taking certain medicines for diabetes mellitus. Alcohol Alcohol can cause sudden decreases in blood glucose (hypoglycemia), especially if you use insulin or take certain medicines for diabetes mellitus. Hypoglycemia can be a life-threatening condition. Symptoms of hypoglycemia (sleepiness, dizziness, and disorientation) are similar to symptoms of having too much alcohol.  If your health care provider has given you approval to drink alcohol, do so in moderation and use the following guidelines:  Women should not have more than one drink per day, and men should not have more than two drinks per day. One drink is equal to:  12 oz of beer.  5 oz of wine.  1 oz of hard liquor.  Do not drink on an empty stomach.  Keep yourself hydrated. Have water, diet soda, or unsweetened iced tea.  Regular soda, juice, and other mixers might contain a lot of carbohydrates and should be counted. WHAT FOODS ARE NOT RECOMMENDED? As you make food choices, it is important to remember that all foods are not the same. Some foods have fewer nutrients per serving than other  foods, even though they might have the same number of calories or carbohydrates. It is difficult to get your body what it needs when you eat foods with fewer nutrients. Examples of foods that you should avoid that are high in calories and carbohydrates but low in nutrients include:  Trans fats (most processed foods list trans fats on the Nutrition Facts label).  Regular soda.  Juice.  Candy.  Sweets, such as cake, pie, doughnuts, and cookies.  Fried foods. WHAT FOODS CAN I EAT? Have nutrient-rich foods, which will nourish your body and keep you healthy. The food you should eat also will depend on several factors, including:  The calories you need.  The medicines you take.  Your weight.  Your blood glucose level.  Your blood pressure level.  Your cholesterol level. You also should eat a variety of foods, including:  Protein, such as meat, poultry, fish, tofu, nuts, and seeds (lean animal proteins are best).  Fruits.  Vegetables.  Dairy products, such as milk, cheese, and yogurt (low fat is best).  Breads, grains, pasta, cereal, rice, and beans.  Fats such as olive oil, trans fat-free margarine, canola oil, avocado, and olives. DOES EVERYONE WITH DIABETES MELLITUS HAVE THE SAME MEAL PLAN? Because every person with diabetes mellitus is different, there is not one meal plan that works for everyone. It is very important that you meet with a dietitian who will help you create a meal plan that is just right for you. Document Released: 04/15/2005 Document Revised: 07/24/2013 Document Reviewed: 06/15/2013 [employer] Patient Information 2015 [employer]. This   information is not intended to replace advice given to you by your health care provider. Make sure you discuss any questions you have with your health care provider.  

## 2014-08-28 NOTE — Progress Notes (Signed)
MRN: [id number] Name: Bryan Mcmillan  Sex: male Age: 44 y.o. DOB: 1971/06/10  Allergies: Penicillins  Chief Complaint  Patient presents with  . Follow-up    HPI: Patient is 44 y.o. male who has to of OSA, seizure disorder, anxiety/depression, diabetes, patient comes today reported to have baseline shortness of breath which is unchanged denies any orthopnea, as per patient he has done repeat sleep study yesterday and is awaiting test results, denies any fever chills nausea vomiting has history of seizure disorder denies any recent seizures patient is a scheduled follow with his neurologist, history of anxiety/depression following up with psychiatrist and already has scheduled appointment, currently he is complaining of dry mouth, patient reported and out of nasal spray probably that contributes to the symptoms, patient does occasionally smoke cigarettes, denies fever chills any cough.  Past Medical History  Diagnosis Date  . Asthma   . Depression   . Diabetes   . Headache(784.0)     frequent  . Migraines   . History of blood clots   . Hyperlipidemia   . Obesity   . Sleep apnea   . DVT (deep venous thrombosis), hx of recurrent 05/23/2012  . History of blood clot in brain, 2012 - [facility] 05/23/2012  . Seizure disorder - followed by [facility] 05/23/2012  . Hyperlipemia 08/25/2012  . Anxiety   . Tuberculosis   . Pneumonia   . Kidney disease   . Kidney stones   . Seizure   . History of suicidal tendencies     Past Surgical History  Procedure Laterality Date  . Brain surgery    . Filter for blood clots        Medication List       This list is accurate as of: 08/28/14 10:40 AM.  Always use your most recent med list.               ABILIFY 5 MG tablet  Generic drug:  ARIPiprazole  Take 5 mg by mouth daily.     ACCU-CHEK AVIVA PLUS W/DEVICE Kit  Check blood sugar TID & QHS     ACCU-CHEK FASTCLIX LANCET Kit     ACCU-CHEK FASTCLIX  LANCETS Misc     bromocriptine 2.5 MG tablet  Commonly known as:  PARLODEL  Take 0.5 tablets (1.25 mg total) by mouth daily.     busPIRone 10 MG tablet  Commonly known as:  BUSPAR  Take 10 mg by mouth 2 (two) times daily.     canagliflozin 300 MG Tabs tablet  Commonly known as:  INVOKANA  Take 1 tablet (300 mg total) by mouth daily.     clonazePAM 0.5 MG tablet  Commonly known as:  KLONOPIN  Take 0.5 mg by mouth 2 (two) times daily as needed for anxiety.     enoxaparin 150 MG/ML injection  Commonly known as:  LOVENOX  Inject 1 mL (150 mg total) into the skin every 12 (twelve) hours. For blood clots     FARXIGA 10 MG Tabs tablet  Generic drug:  dapagliflozin propanediol  Take 10 mg by mouth daily.     fluticasone 50 MCG/ACT nasal spray  Commonly known as:  FLONASE  Place 2 sprays into both nostrils daily.     gabapentin 300 MG capsule  Commonly known as:  NEURONTIN  Take 1 capsule (300 mg total) by mouth 3 (three) times daily.     glucose blood test strip  Commonly known as:  ACCU-CHEK  AVIVA PLUS  Use as instructed     levETIRAcetam 500 MG tablet  Commonly known as:  KEPPRA  TAKE 1 TABLET BY MOUTH 2 TIMES DAILY.     metFORMIN 1000 MG tablet  Commonly known as:  GLUCOPHAGE  TAKE 1 TABLET BY MOUTH 2 TIMES DAILY WITH A MEAL. FOR DIABETES MANAGEMENT     naproxen 500 MG tablet  Commonly known as:  NAPROSYN  Take 1 tablet (500 mg total) by mouth 2 (two) times daily with a meal.     repaglinide 1 MG tablet  Commonly known as:  PRANDIN  Take 1 tablet (1 mg total) by mouth 3 (three) times daily before meals.     simvastatin 80 MG tablet  Commonly known as:  ZOCOR  Take 80 mg by mouth daily.     Vilazodone HCl 40 MG Tabs  Commonly known as:  VIIBRYD  Take 1 tablet (40 mg total) by mouth daily.        Meds ordered this encounter  Medications  . fluticasone (FLONASE) 50 MCG/ACT nasal spray    Sig: Place 2 sprays into both nostrils daily.    Dispense:  16 g     Refill:  3    Immunization History  Administered Date(s) Administered  . Influenza Whole 05/02/2012  . Influenza,inj,Quad PF,36+ Mos 05/01/2014    Family History  Problem Relation Age of Onset  . Hyperlipidemia      parent  . Heart disease      parent  . Stroke      parent  . Diabetes      grandparent /parent  . Obesity Other   . Heart attack Other     History  Substance Use Topics  . Smoking status: Former Research scientist (life sciences)  . Smokeless tobacco: Not on file  . Alcohol Use: No    Review of Systems   As noted in HPI  Filed Vitals:   08/28/14 1005  BP: 131/86  Pulse: 78  Temp: 98 F (36.7 C)  Resp: 16    Physical Exam  Physical Exam  Constitutional: He is oriented to person, place, and time.  HENT:  Nasal congestion no sinus tenderness   Eyes: EOM are normal. Pupils are equal, round, and reactive to light.  Cardiovascular: Normal rate and regular rhythm.   Pulmonary/Chest: Breath sounds normal. No respiratory distress. He has no wheezes. He has no rales.  Musculoskeletal: He exhibits no edema.  Neurological: He is alert and oriented to person, place, and time. He has normal reflexes. No cranial nerve deficit. Coordination normal.    CBC    Component Value Date/Time   WBC 8.5 09/23/2013 1355   RBC 5.16 09/23/2013 1355   HGB 15.2 09/23/2013 1355   HCT 44.2 09/23/2013 1355   PLT 310 09/23/2013 1355   MCV 85.7 09/23/2013 1355   LYMPHSABS 3.6 07/11/2013 1808   MONOABS 0.6 07/11/2013 1808   EOSABS 0.1 07/11/2013 1808   BASOSABS 0.0 07/11/2013 1808    CMP     Component Value Date/Time   NA 137 09/23/2013 1355   K 4.2 09/23/2013 1355   CL 97 09/23/2013 1355   CO2 21 09/23/2013 1355   GLUCOSE 284* 09/23/2013 1355   BUN 14 09/23/2013 1355   CREATININE 0.86 09/23/2013 1355   CALCIUM 9.9 09/23/2013 1355   PROT 7.7 09/23/2013 1355   ALBUMIN 4.2 09/23/2013 1355   AST 30 09/23/2013 1355   ALT 53 09/23/2013 1355   ALKPHOS 69 09/23/2013 1355  BILITOT 0.2*  09/23/2013 1355   GFRNONAA >90 09/23/2013 1355   GFRAA >90 09/23/2013 1355    Lab Results  Component Value Date/Time   CHOL 141 09/26/2013 06:20 AM    No components found for: HGA1C  Lab Results  Component Value Date/Time   AST 30 09/23/2013 01:55 PM    Assessment and Plan  Other specified diabetes mellitus without complications - Plan:  Results for orders placed or performed in visit on 08/28/14  Glucose (CBG)  Result Value Ref Range   POC Glucose 216.0 (A) 70 - 99 mg/dl  HgB A1c  Result Value Ref Range   Hemoglobin A1C 7.20    Hemoglobin A1c has trended down,patient will continue with current regimen follow with endocrinology as scheduled.  History of sleep apnea recently had  sleep study done awaiting the test results.  Nasal congestion - Plan: patient is given refill on fluticasone (FLONASE) 50 MCG/ACT nasal spray  SOB (shortness of breath)  Baseline unchanged, Likely secondary to obstructive sleep apnea.  Seizure disorder Denies any recent seizures scheduled to follow with neurology  Health Maintenance  -Vaccinations:  uptodate with the flu shot   Return in about 3 months (around 11/27/2014).  Lorayne Marek, MD

## 2014-08-28 NOTE — Progress Notes (Signed)
Patient here for follow up Patient did have a sleep study done yesterday but complains Of not sleeping well When lying down has some SOB and gurgling in his throat His anxiety and depression is a little high Having pain in the lower half of his body

## 2014-09-03 ENCOUNTER — Other Ambulatory Visit: Payer: Self-pay | Admitting: Internal Medicine

## 2014-09-04 ENCOUNTER — Encounter: Payer: Self-pay | Admitting: Diagnostic Neuroimaging

## 2014-09-04 ENCOUNTER — Other Ambulatory Visit: Payer: Self-pay | Admitting: *Deleted

## 2014-09-04 ENCOUNTER — Other Ambulatory Visit: Payer: Self-pay | Admitting: Internal Medicine

## 2014-09-04 ENCOUNTER — Ambulatory Visit (INDEPENDENT_AMBULATORY_CARE_PROVIDER_SITE_OTHER): Payer: Medicaid Other | Admitting: Diagnostic Neuroimaging

## 2014-09-04 VITALS — BP 128/80 | HR 82 | Ht 71.0 in | Wt 283.6 lb

## 2014-09-04 DIAGNOSIS — R404 Transient alteration of awareness: Secondary | ICD-10-CM
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
## 2014-09-04 NOTE — Patient Instructions (Signed)
I will check EEG.  No driving until abnormal spells stop occurring.

## 2014-09-10 ENCOUNTER — Other Ambulatory Visit: Payer: Self-pay | Admitting: Internal Medicine

## 2014-09-10 ENCOUNTER — Telehealth: Payer: Self-pay | Admitting: Internal Medicine

## 2014-09-10 ENCOUNTER — Ambulatory Visit: Payer: Medicaid Other | Admitting: Internal Medicine

## 2014-09-10 NOTE — Telephone Encounter (Signed)
Patient has come in today because he has had a migraine since Friday morning; Patient is requesting a medication refill for naproxen (NAPROSYN) 500 MG tablet and waiting on authorization from PCP; patient is present in the lobby

## 2014-09-14 ENCOUNTER — Ambulatory Visit (HOSPITAL_BASED_OUTPATIENT_CLINIC_OR_DEPARTMENT_OTHER): Payer: Medicaid Other | Admitting: Internal Medicine

## 2014-09-14 DIAGNOSIS — G4733 Obstructive sleep apnea (adult) (pediatric): Secondary | ICD-10-CM

## 2014-09-14 NOTE — Sleep Study (Signed)
   NAME: Bryan Mcmillan DATE OF BIRTH:  12-Dec-1970 MEDICAL RECORD NUMBER [id number]  LOCATION: [facility]  PHYSICIAN: Kathleene Bergemann D  DATE OF STUDY: 08/27/2014  SLEEP STUDY TYPE: Nocturnal Polysomnogram               REFERRING PHYSICIAN: Lorayne Marek, MD  INDICATION FOR STUDY: Hypersomnia with sleep apnea  EPWORTH SLEEPINESS SCORE:   18/24 HEIGHT: 5\' 11"  (180.3 cm)  WEIGHT:  (270#)    Body mass index is 0.00 kg/(m^2).  NECK SIZE: 16 in.  MEDICATIONS: Charted for review  SLEEP ARCHITECTURE: Total sleep time 327 minutes with sleep efficiency 90.1%. Stage I was 17.4%, stage II 20.8%, stage III absent, REM 61.8% of total sleep time. Sleep latency 1.5 minutes, REM latency 17 minutes, awake after sleep onset 29.5 minutes, arousal index 10.5, bedtime medication: None  RESPIRATORY DATA: Apnea hypopnea index (AHI) 1.3 per hour. 7 total events scored including 4 obstructive apneas, 2 central apneas, 1 mixed apnea. Most events were while nonsupine. REM AHI 1.2 per hour.  OXYGEN DATA: Very loud snoring with oxygen desaturation to a nadir of 84% and mean saturation 91.6% on room air   CARDIAC DATA: Normal sinus rhythm  MOVEMENT/PARASOMNIA: A few limb jerks were noted with little effect on sleep, bathroom 1  IMPRESSION/ RECOMMENDATION:   1) Respiratory pattern within normal limits except for loud snoring. Occasional respiratory event with sleep disturbance, AHI 1.3 per hour. The normal range for adults is an AHI from 0-5 per hour. Oxygen desaturation to a nadir of 84% and mean saturation 91.6% on room air. 2) Unusually high percentage time spent in REM. This may correspond to reported history of hallucinations and vivid dreams as is sometimes seen in narcolepsy. Rebound from prolonged REM sleep suppression by an antidepressant, or chronic sleep deprivation might possibly cause this pattern temporarily.   Deneise Lever Diplomate, American Board of Sleep  Medicine  ELECTRONICALLY SIGNED ON:  09/14/2014, 9:35 AM [place] PH: [phone]   FX: [phone] [place]

## 2014-09-16 ENCOUNTER — Ambulatory Visit: Payer: Self-pay | Admitting: Internal Medicine

## 2014-09-25 ENCOUNTER — Ambulatory Visit (INDEPENDENT_AMBULATORY_CARE_PROVIDER_SITE_OTHER): Payer: Medicaid Other | Admitting: Endocrinology

## 2014-09-25 ENCOUNTER — Encounter: Payer: Self-pay | Admitting: Endocrinology

## 2014-09-25 VITALS — BP 132/74 | HR 85 | Temp 97.7°F | Ht 71.0 in | Wt 284.0 lb

## 2014-09-25 DIAGNOSIS — E1165 Type 2 diabetes mellitus with hyperglycemia: Secondary | ICD-10-CM

## 2014-09-25 DIAGNOSIS — IMO0002 Reserved for concepts with insufficient information to code with codable children: Secondary | ICD-10-CM

## 2014-09-25 MED ORDER — REPAGLINIDE 2 MG PO TABS: 2.0000 mg | ORAL_TABLET | Freq: Three times a day (TID) | ORAL | Status: DC

## 2014-09-25 NOTE — Procedures (Signed)
   [facility]  EEG (ELECTROENCEPHALOGRAM) REPORT   STUDY DATE: 09/04/14 PATIENT NAME: Bryan Mcmillan DOB: 1970-12-02 MRN: [id number]  ORDERING CLINICIAN: Andrey Spearman, MD   TECHNOLOGIST: Towana Badger  TECHNIQUE: Electroencephalogram was recorded utilizing standard 10-20 system of lead placement and reformatted into average and bipolar montages.  RECORDING TIME: 30 minutes ACTIVATION: hyperventilation and photic stimulation  CLINICAL INFORMATION: 44 year old male with black out spells.  FINDINGS: Background rhythms of 9-10 hertz and 20-25 microvolts. No focal, lateralizing, epileptiform activity or seizures are seen. Patient recorded in the awake state. EKG channel shows regular rhythm 84 beats per minute.  IMPRESSION:  Normal EEG in the awake state.    INTERPRETING PHYSICIAN:  Penni Bombard, MD Certified in Neurology, Neurophysiology and Neuroimaging  [facility] [address] [place], [place] [address] [phone]
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
## 2014-09-25 NOTE — Patient Instructions (Addendum)
i have sent a prescription to your pharmacy, to double the repaglinide Please come back for a follow-up appointment in 3 months check your blood sugar once a day.  vary the time of day when you check, between before the 3 meals, and at bedtime.  also check if you have symptoms of your blood sugar being too high or too low.  please keep a record of the readings and bring it to your next appointment here.  You can write it on any piece of paper.  please call us sooner if your blood sugar goes below 70, or if you have a lot of readings over 200. good diet and exercise habits significanly improve the control of your diabetes.  please let me know if you wish to be referred to a dietician.

## 2014-10-28 ENCOUNTER — Other Ambulatory Visit: Payer: Self-pay | Admitting: Endocrinology

## 2014-11-01 ENCOUNTER — Ambulatory Visit: Payer: Medicaid Other | Attending: Internal Medicine | Admitting: Internal Medicine

## 2014-11-01 ENCOUNTER — Encounter: Payer: Self-pay | Admitting: Internal Medicine

## 2014-11-01 VITALS — BP 111/74 | HR 83 | Temp 98.0°F | Resp 16 | Wt 286.6 lb

## 2014-11-01 DIAGNOSIS — I2699 Other pulmonary embolism without acute cor pulmonale: Secondary | ICD-10-CM | POA: Diagnosis not present

## 2014-11-01 DIAGNOSIS — G40909 Epilepsy, unspecified, not intractable, without status epilepticus: Secondary | ICD-10-CM | POA: Diagnosis not present

## 2014-11-01 DIAGNOSIS — E139 Other specified diabetes mellitus without complications: Secondary | ICD-10-CM | POA: Diagnosis not present

## 2014-11-01 DIAGNOSIS — E119 Type 2 diabetes mellitus without complications: Secondary | ICD-10-CM | POA: Diagnosis present

## 2014-11-01 DIAGNOSIS — I82409 Acute embolism and thrombosis of unspecified deep veins of unspecified lower extremity: Secondary | ICD-10-CM

## 2014-11-01 DIAGNOSIS — E785 Hyperlipidemia, unspecified: Secondary | ICD-10-CM | POA: Insufficient documentation

## 2014-11-01 DIAGNOSIS — Z86718 Personal history of other venous thrombosis and embolism: Secondary | ICD-10-CM | POA: Diagnosis not present

## 2014-11-01 LAB — GLUCOSE, POCT (MANUAL RESULT ENTRY): POC Glucose: 221 mg/dl — AB (ref 70–99)

## 2014-11-01 MED ORDER — ENOXAPARIN SODIUM 150 MG/ML ~~LOC~~ SOLN: 150.0000 mg | Freq: Two times a day (BID) | SUBCUTANEOUS | Status: DC

## 2014-11-01 NOTE — Progress Notes (Signed)
Patient had a sleep study done about a month or so ago and wanting to have his results Patient also needs a refill on his lovenox-has been out of  Medication for over a week

## 2014-11-01 NOTE — Progress Notes (Signed)
MRN: [id number] Name: Bryan Mcmillan  Sex: male Age: 44 y.o. DOB: 09/26/1970  Allergies: Penicillins  Chief Complaint  Patient presents with  . sleep study results    HPI: Patient is 44 y.o. male who history ofdiabetes, anxiety/depression, seizure disorder, OSA, DVT/PE on chronic anticoagulations, comes today requesting refill on his Lovenox, also patient had a sleep study done in February, EMR reviewed as per report ) Respiratory pattern within normal limits except for loud snoring. Occasional respiratory event with sleep disturbance, AHI 1.3 per hour. The normal range for adults is an AHI from 0-5 per hour. Oxygen desaturation to a nadir of 84% and mean saturation 91.6% on room air. 2) Unusually high percentage time spent in REM. This may correspond to reported history of hallucinations and vivid dreams as is sometimes seen in narcolepsy. Rebound from prolonged REM sleep suppression by an antidepressant, or chronic sleep deprivation might possibly cause this pattern temporarily.   Patient is follows with the neurology as well as psychiatrist,significant findings are with REM sleep pattern, patient is provided with copy of report, he will discuss with his neurologist  For further management.    Past Medical History  Diagnosis Date  . Asthma   . Depression   . Diabetes   . Headache(784.0)     frequent  . Migraines   . History of blood clots   . Hyperlipidemia   . Obesity   . Sleep apnea   . DVT (deep venous thrombosis), hx of recurrent 05/23/2012  . History of blood clot in brain, 2012 - [facility] by [facility] 05/23/2012  . Seizure disorder - followed by [place] [facility] 05/23/2012  . Hyperlipemia 08/25/2012  . Anxiety   . Tuberculosis   . Pneumonia   . Kidney disease   . Kidney stones   . Seizure   . History of suicidal tendencies     Past Surgical History  Procedure Laterality Date  . Brain surgery    . Filter for blood clots        Medication List      This list is accurate as of: 11/01/14 10:42 AM.  Always use your most recent med list.               ABILIFY 5 MG tablet  Generic drug:  ARIPiprazole  Take 5 mg by mouth daily.     ACCU-CHEK AVIVA PLUS W/DEVICE Kit  Check blood sugar TID & QHS     ACCU-CHEK FASTCLIX LANCET Kit     ACCU-CHEK FASTCLIX LANCETS Misc     bromocriptine 2.5 MG tablet  Commonly known as:  PARLODEL  Take 0.5 tablets (1.25 mg total) by mouth daily.     busPIRone 10 MG tablet  Commonly known as:  BUSPAR  Take 10 mg by mouth 2 (two) times daily.     canagliflozin 300 MG Tabs tablet  Commonly known as:  INVOKANA  Take 1 tablet (300 mg total) by mouth daily.     clonazePAM 0.5 MG tablet  Commonly known as:  KLONOPIN  Take 0.5 mg by mouth 2 (two) times daily as needed for anxiety.     enoxaparin 150 MG/ML injection  Commonly known as:  LOVENOX  Inject 1 mL (150 mg total) into the skin every 12 (twelve) hours. For blood clots     FARXIGA 10 MG Tabs tablet  Generic drug:  dapagliflozin propanediol  Take 10 mg by mouth daily.     FARXIGA 10 MG Tabs tablet  Generic drug:  dapagliflozin propanediol  TAKE 1 TABLET BY MOUTH DAILY.     fluticasone 50 MCG/ACT nasal spray  Commonly known as:  FLONASE  Place 2 sprays into both nostrils daily.     gabapentin 300 MG capsule  Commonly known as:  NEURONTIN  Take 1 capsule (300 mg total) by mouth 3 (three) times daily.     glucose blood test strip  Commonly known as:  ACCU-CHEK AVIVA PLUS  Use as instructed     hydrOXYzine 25 MG tablet  Commonly known as:  ATARAX/VISTARIL     levETIRAcetam 500 MG tablet  Commonly known as:  KEPPRA  TAKE 1 TABLET BY MOUTH 2 TIMES DAILY.     metFORMIN 1000 MG tablet  Commonly known as:  GLUCOPHAGE  TAKE 1 TABLET BY MOUTH 2 TIMES DAILY WITH A MEAL FOR DIABETES MANAGEMENT.     naproxen 500 MG tablet  Commonly known as:  NAPROSYN  TAKE 1 TABLET BY MOUTH 2 TIMES DAILY WITH A MEAL     pioglitazone 45 MG tablet   Commonly known as:  ACTOS     repaglinide 2 MG tablet  Commonly known as:  PRANDIN  Take 1 tablet (2 mg total) by mouth 3 (three) times daily before meals.     simvastatin 80 MG tablet  Commonly known as:  ZOCOR  Take 80 mg by mouth daily.     simvastatin 80 MG tablet  Commonly known as:  ZOCOR  TAKE 1 TABLET BY MOUTH DAILY     traZODone 150 MG tablet  Commonly known as:  DESYREL     Vilazodone HCl 40 MG Tabs  Commonly known as:  VIIBRYD  Take 1 tablet (40 mg total) by mouth daily.        Meds ordered this encounter  Medications  . enoxaparin (LOVENOX) 150 MG/ML injection    Sig: Inject 1 mL (150 mg total) into the skin every 12 (twelve) hours. For blood clots    Dispense:  180 Syringe    Refill:  1    Immunization History  Administered Date(s) Administered  . Influenza Whole 05/02/2012  . Influenza,inj,Quad PF,36+ Mos 05/01/2014    Family History  Problem Relation Age of Onset  . Hyperlipidemia Mother   . Heart disease Father   . Stroke      parent  . Diabetes      grandparent /parent  . Obesity Other   . Heart attack Other     History  Substance Use Topics  . Smoking status: Former Research scientist (life sciences)  . Smokeless tobacco: Not on file  . Alcohol Use: No    Review of Systems   As noted in HPI  Filed Vitals:   11/01/14 0926  BP: 111/74  Pulse: 83  Temp: 98 F (36.7 C)  Resp: 16    Physical Exam  Physical Exam  Constitutional: No distress.  Eyes: EOM are normal. Pupils are equal, round, and reactive to light.  Cardiovascular: Normal rate and regular rhythm.   Pulmonary/Chest: Breath sounds normal. No respiratory distress. He has no wheezes. He has no rales.  Musculoskeletal: He exhibits no edema.    CBC    Component Value Date/Time   WBC 8.5 09/23/2013 1355   RBC 5.16 09/23/2013 1355   HGB 15.2 09/23/2013 1355   HCT 44.2 09/23/2013 1355   PLT 310 09/23/2013 1355   MCV 85.7 09/23/2013 1355   LYMPHSABS 3.6 07/11/2013 1808   MONOABS 0.6  07/11/2013 1808  EOSABS 0.1 07/11/2013 1808   BASOSABS 0.0 07/11/2013 1808    CMP     Component Value Date/Time   NA 137 09/23/2013 1355   K 4.2 09/23/2013 1355   CL 97 09/23/2013 1355   CO2 21 09/23/2013 1355   GLUCOSE 284* 09/23/2013 1355   BUN 14 09/23/2013 1355   CREATININE 0.86 09/23/2013 1355   CALCIUM 9.9 09/23/2013 1355   PROT 7.7 09/23/2013 1355   ALBUMIN 4.2 09/23/2013 1355   AST 30 09/23/2013 1355   ALT 53 09/23/2013 1355   ALKPHOS 69 09/23/2013 1355   BILITOT 0.2* 09/23/2013 1355   GFRNONAA >90 09/23/2013 1355   GFRAA >90 09/23/2013 1355    Lab Results  Component Value Date/Time   CHOL 141 09/26/2013 06:20 AM    No components found for: HGA1C  Lab Results  Component Value Date/Time   AST 30 09/23/2013 01:55 PM    Assessment and Plan  Other specified diabetes mellitus without complications - Plan:  Results for orders placed or performed in visit on 11/01/14  Glucose (CBG)  Result Value Ref Range   POC Glucose 221 (A) 70 - 99 mg/dl   His last hemoglobin A1c was 7.2%, currently being managed his endocrinologist.  Pulmonary embolism - Plan: enoxaparin (LOVENOX) 150 MG/ML injection  DVT (deep venous thrombosis), unspecified laterality - Plan: enoxaparin (LOVENOX) 150 MG/ML injection  Seizure disorder/abnormal sleep  Patient to discuss sleep study findings with his neurologist for further recommendation.   Return in about 3 months (around 01/31/2015), or if symptoms worsen or fail to improve.   This note has been created with Surveyor, quantity. Any transcriptional errors are unintentional.    Lorayne Marek, MD

## 2014-11-15 ENCOUNTER — Other Ambulatory Visit: Payer: Self-pay | Admitting: Internal Medicine

## 2014-11-28 ENCOUNTER — Other Ambulatory Visit: Payer: Self-pay | Admitting: Endocrinology

## 2014-12-02 ENCOUNTER — Other Ambulatory Visit: Payer: Self-pay

## 2014-12-02 DIAGNOSIS — I2699 Other pulmonary embolism without acute cor pulmonale: Secondary | ICD-10-CM

## 2014-12-02 DIAGNOSIS — I82409 Acute embolism and thrombosis of unspecified deep veins of unspecified lower extremity: Secondary | ICD-10-CM

## 2014-12-02 MED ORDER — ENOXAPARIN SODIUM 150 MG/ML ~~LOC~~ SOLN: 150.0000 mg | Freq: Two times a day (BID) | SUBCUTANEOUS | Status: DC

## 2014-12-03 ENCOUNTER — Ambulatory Visit (INDEPENDENT_AMBULATORY_CARE_PROVIDER_SITE_OTHER): Payer: Medicaid Other | Admitting: Diagnostic Neuroimaging

## 2014-12-03 ENCOUNTER — Encounter: Payer: Self-pay | Admitting: Diagnostic Neuroimaging

## 2014-12-03 ENCOUNTER — Other Ambulatory Visit: Payer: Self-pay | Admitting: Family Medicine

## 2014-12-03 VITALS — BP 118/71 | HR 78 | Ht 71.0 in | Wt 288.1 lb

## 2014-12-03 DIAGNOSIS — R9089 Other abnormal findings on diagnostic imaging of central nervous system: Secondary | ICD-10-CM

## 2014-12-03 DIAGNOSIS — I82409 Acute embolism and thrombosis of unspecified deep veins of unspecified lower extremity: Secondary | ICD-10-CM

## 2014-12-03 DIAGNOSIS — I2699 Other pulmonary embolism without acute cor pulmonale: Secondary | ICD-10-CM

## 2014-12-03 DIAGNOSIS — R930 Abnormal findings on diagnostic imaging of skull and head, not elsewhere classified: Secondary | ICD-10-CM

## 2014-12-03 NOTE — Patient Instructions (Signed)
I will check MRI brain in Sept 2016.  Gradually reduce caffeine.
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
## 2014-12-21 ENCOUNTER — Ambulatory Visit
Admission: RE | Admit: 2014-12-21 | Discharge: 2014-12-21 | Disposition: A | Discharge status: Home / Self Care | Payer: Medicaid Other | Source: Ambulatory Visit | Attending: Diagnostic Neuroimaging | Admitting: Diagnostic Neuroimaging

## 2014-12-21 DIAGNOSIS — R9089 Other abnormal findings on diagnostic imaging of central nervous system: Secondary | ICD-10-CM

## 2014-12-21 DIAGNOSIS — R930 Abnormal findings on diagnostic imaging of skull and head, not elsewhere classified: Secondary | ICD-10-CM | POA: Diagnosis not present

## 2014-12-21 DIAGNOSIS — R404 Transient alteration of awareness: Secondary | ICD-10-CM | POA: Diagnosis not present

## 2014-12-21 MED ORDER — GADOBENATE DIMEGLUMINE 529 MG/ML IV SOLN: 20.0000 mL | Freq: Once | INTRAVENOUS | Status: AC | PRN

## 2014-12-24 ENCOUNTER — Encounter: Payer: Self-pay | Admitting: Endocrinology

## 2014-12-24 ENCOUNTER — Ambulatory Visit (INDEPENDENT_AMBULATORY_CARE_PROVIDER_SITE_OTHER): Payer: Medicaid Other | Admitting: Endocrinology

## 2014-12-24 VITALS — BP 124/86 | HR 87 | Temp 97.6°F | Ht 71.0 in | Wt 289.0 lb

## 2014-12-24 DIAGNOSIS — IMO0002 Reserved for concepts with insufficient information to code with codable children: Secondary | ICD-10-CM

## 2014-12-24 DIAGNOSIS — E1165 Type 2 diabetes mellitus with hyperglycemia: Secondary | ICD-10-CM

## 2014-12-24 LAB — BASIC METABOLIC PANEL
BUN: 20 mg/dL (ref 6–23)
CHLORIDE: 104 meq/L (ref 96–112)
CO2: 26 mEq/L (ref 19–32)
Calcium: 9.7 mg/dL (ref 8.4–10.5)
Creatinine, Ser: 0.85 mg/dL (ref 0.40–1.50)
GFR: 104.34 mL/min (ref >60.00)
GLUCOSE: 134 mg/dL — AB (ref 70–99)
POTASSIUM: 4 meq/L (ref 3.5–5.1)
Sodium: 137 mEq/L (ref 135–145)

## 2014-12-24 LAB — MICROALBUMIN / CREATININE URINE RATIO
Creatinine,U: 115.2 mg/dL
Microalb Creat Ratio: 0.7 mg/g (ref 0.0–30.0)
Microalb, Ur: 0.8 mg/dL (ref 0.0–1.9)

## 2014-12-24 LAB — HEMOGLOBIN A1C: Hgb A1c MFr Bld: 7.1 % — ABNORMAL HIGH (ref 4.6–6.5)

## 2014-12-24 MED ORDER — BROMOCRIPTINE MESYLATE 2.5 MG PO TABS: 2.5000 mg | ORAL_TABLET | Freq: Every day | ORAL | Status: DC
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
## 2014-12-24 NOTE — Patient Instructions (Addendum)
blood tests are requested for you today.  We'll let you know about the results. Try taking just 1/2 of a repaglinide with lunch Please come back for a follow-up appointment in 4 months check your blood sugar once a day.  vary the time of day when you check, between before the 3 meals, and at bedtime.  also check if you have symptoms of your blood sugar being too high or too low.  please keep a record of the readings and bring it to your next appointment here.  You can write it on any piece of paper.  please call us sooner if your blood sugar goes below 70, or if you have a lot of readings over 200.

## 2015-01-01 ENCOUNTER — Other Ambulatory Visit: Payer: Self-pay

## 2015-01-03 ENCOUNTER — Other Ambulatory Visit: Payer: Self-pay | Admitting: Internal Medicine

## 2015-01-27 ENCOUNTER — Other Ambulatory Visit: Payer: Self-pay

## 2015-02-04 ENCOUNTER — Other Ambulatory Visit: Payer: Self-pay | Admitting: Internal Medicine

## 2015-02-10 ENCOUNTER — Encounter: Payer: Self-pay | Admitting: Internal Medicine

## 2015-02-10 ENCOUNTER — Ambulatory Visit: Payer: Medicaid Other | Attending: Internal Medicine | Admitting: Internal Medicine

## 2015-02-10 VITALS — BP 116/74 | HR 74 | Temp 98.0°F | Resp 16 | Wt 293.0 lb

## 2015-02-10 DIAGNOSIS — G40909 Epilepsy, unspecified, not intractable, without status epilepticus: Secondary | ICD-10-CM | POA: Insufficient documentation

## 2015-02-10 DIAGNOSIS — I2699 Other pulmonary embolism without acute cor pulmonale: Secondary | ICD-10-CM | POA: Diagnosis not present

## 2015-02-10 DIAGNOSIS — Z87891 Personal history of nicotine dependence: Secondary | ICD-10-CM | POA: Diagnosis not present

## 2015-02-10 DIAGNOSIS — F419 Anxiety disorder, unspecified: Secondary | ICD-10-CM | POA: Insufficient documentation

## 2015-02-10 DIAGNOSIS — E119 Type 2 diabetes mellitus without complications: Secondary | ICD-10-CM | POA: Insufficient documentation

## 2015-02-10 DIAGNOSIS — G43909 Migraine, unspecified, not intractable, without status migrainosus: Secondary | ICD-10-CM | POA: Diagnosis not present

## 2015-02-10 DIAGNOSIS — F329 Major depressive disorder, single episode, unspecified: Secondary | ICD-10-CM | POA: Insufficient documentation

## 2015-02-10 DIAGNOSIS — E139 Other specified diabetes mellitus without complications: Secondary | ICD-10-CM | POA: Diagnosis not present

## 2015-02-10 DIAGNOSIS — G43809 Other migraine, not intractable, without status migrainosus: Secondary | ICD-10-CM

## 2015-02-10 DIAGNOSIS — Z79899 Other long term (current) drug therapy: Secondary | ICD-10-CM | POA: Diagnosis not present

## 2015-02-10 DIAGNOSIS — Z86711 Personal history of pulmonary embolism: Secondary | ICD-10-CM | POA: Insufficient documentation

## 2015-02-10 DIAGNOSIS — J45909 Unspecified asthma, uncomplicated: Secondary | ICD-10-CM | POA: Insufficient documentation

## 2015-02-10 DIAGNOSIS — E785 Hyperlipidemia, unspecified: Secondary | ICD-10-CM | POA: Diagnosis not present

## 2015-02-10 LAB — GLUCOSE, POCT (MANUAL RESULT ENTRY): POC Glucose: 236 mg/dl — AB (ref 70–99)

## 2015-02-10 NOTE — Progress Notes (Signed)
MRN: [id number] Name: Bryan Mcmillan  Sex: male Age: 44 y.o. DOB: 1971/07/06  Allergies: Penicillins  Chief Complaint  Patient presents with  . Leg Pain    HPI: Patient is 44 y.o. male who has history of DVT/PE on chronic anticoagulations, history of diabetes being managed by his endocrinologist, his last hemoglobin A1c was 7.1%, today his major concern is on and off migraine type headaches, denies any family history of migraine headaches, he does follow up with the neurologist since he has history of brain tumor, since patient is on Lovenox, is advised not to take any NSAIDs, sometimes he has leg pain denies any recent fall or trauma, he follows up with psychiatrist history of anxiety/depression, already taking Neurontin.patient currently denies any numbness weakness, the pain is 3/10.  Past Medical History  Diagnosis Date  . Asthma   . Depression   . Diabetes   . Headache(784.0)     frequent  . Migraines   . History of blood clots   . Hyperlipidemia   . Obesity   . Sleep apnea   . DVT (deep venous thrombosis), hx of recurrent 05/23/2012  . History of blood clot in brain, 2012 - followed by [facility] 05/23/2012  . Seizure disorder - followed by [place] [facility] 05/23/2012  . Hyperlipemia 08/25/2012  . Anxiety   . Tuberculosis   . Pneumonia   . Kidney disease   . Kidney stones   . Seizure   . History of suicidal tendencies     Past Surgical History  Procedure Laterality Date  . Brain surgery    . Filter for blood clots        Medication List       This list is accurate as of: 02/10/15 10:14 AM.  Always use your most recent med list.               ACCU-CHEK AVIVA PLUS W/DEVICE Kit  Check blood sugar TID & QHS     ACCU-CHEK FASTCLIX LANCET Kit     ACCU-CHEK FASTCLIX LANCETS Misc     bromocriptine 2.5 MG tablet  Commonly known as:  PARLODEL  Take 1 tablet (2.5 mg total) by mouth daily.     busPIRone 10 MG tablet  Commonly known as:  BUSPAR    Take 10 mg by mouth 2 (two) times daily.     enoxaparin 150 MG/ML injection  Commonly known as:  LOVENOX  Inject 1 mL (150 mg total) into the skin every 12 (twelve) hours. For blood clots     FARXIGA 10 MG Tabs tablet  Generic drug:  dapagliflozin propanediol  TAKE 1 TABLET BY MOUTH DAILY.     gabapentin 300 MG capsule  Commonly known as:  NEURONTIN  Take 1 capsule (300 mg total) by mouth 3 (three) times daily.     glucose blood test strip  Commonly known as:  ACCU-CHEK AVIVA PLUS  Use as instructed     Lurasidone HCl 20 MG Tabs  Take by mouth.     metFORMIN 1000 MG tablet  Commonly known as:  GLUCOPHAGE  TAKE 1 TABLET BY MOUTH 2 TIMES DAILY WITH A MEAL FOR DIABETES MANAGEMENT.     pioglitazone 45 MG tablet  Commonly known as:  ACTOS     repaglinide 2 MG tablet  Commonly known as:  PRANDIN  1 tab with breakfast, 1/2 with lunch, and 1 with supper     simvastatin 80 MG tablet  Commonly known as:  ZOCOR  TAKE 1 TABLET BY MOUTH DAILY     traZODone 150 MG tablet  Commonly known as:  DESYREL     Vilazodone HCl 40 MG Tabs  Commonly known as:  VIIBRYD  Take 1 tablet (40 mg total) by mouth daily.        No orders of the defined types were placed in this encounter.    Immunization History  Administered Date(s) Administered  . Influenza Whole 05/02/2012  . Influenza,inj,Quad PF,36+ Mos 05/01/2014    Family History  Problem Relation Age of Onset  . Hyperlipidemia Mother   . Heart disease Father   . Stroke      parent  . Diabetes      grandparent /parent  . Obesity Other   . Heart attack Other     History  Substance Use Topics  . Smoking status: Former Research scientist (life sciences)  . Smokeless tobacco: Not on file  . Alcohol Use: No    Review of Systems   As noted in HPI  Filed Vitals:   02/10/15 0931  BP: 116/74  Pulse: 74  Temp: 98 F (36.7 C)  Resp: 16    Physical Exam  Physical Exam  Constitutional: No distress.  Eyes: EOM are normal.  Cardiovascular:  Normal rate and regular rhythm.   Pulmonary/Chest: Breath sounds normal. No respiratory distress. He has no wheezes. He has no rales.  Musculoskeletal: He exhibits no edema.    CBC    Component Value Date/Time   WBC 8.5 09/23/2013 1355   RBC 5.16 09/23/2013 1355   HGB 15.2 09/23/2013 1355   HCT 44.2 09/23/2013 1355   PLT 310 09/23/2013 1355   MCV 85.7 09/23/2013 1355   LYMPHSABS 3.6 07/11/2013 1808   MONOABS 0.6 07/11/2013 1808   EOSABS 0.1 07/11/2013 1808   BASOSABS 0.0 07/11/2013 1808    CMP     Component Value Date/Time   NA 137 12/24/2014 1037   K 4.0 12/24/2014 1037   CL 104 12/24/2014 1037   CO2 26 12/24/2014 1037   GLUCOSE 134* 12/24/2014 1037   BUN 20 12/24/2014 1037   CREATININE 0.85 12/24/2014 1037   CALCIUM 9.7 12/24/2014 1037   PROT 7.7 09/23/2013 1355   ALBUMIN 4.2 09/23/2013 1355   AST 30 09/23/2013 1355   ALT 53 09/23/2013 1355   ALKPHOS 69 09/23/2013 1355   BILITOT 0.2* 09/23/2013 1355   GFRNONAA >90 09/23/2013 1355   GFRAA >90 09/23/2013 1355    Lab Results  Component Value Date/Time   CHOL 141 09/26/2013 06:20 AM    Lab Results  Component Value Date/Time   HGBA1C 7.1* 12/24/2014 10:37 AM   HGBA1C 7.20 08/28/2014 09:59 AM    Lab Results  Component Value Date/Time   AST 30 09/23/2013 01:55 PM    Assessment and Plan  Other specified diabetes mellitus without complications - Plan:  Results for orders placed or performed in visit on 02/10/15  Glucose (CBG)  Result Value Ref Range   POC Glucose 236 (A) 70 - 99 mg/dl   His last hemoglobin A1c was 7.1%, fairly well controlled diabetes continue follow with endocrinologist.  Pulmonary embolism/DVT In the past patient used to be on Coumadin and had recurrent blood clots, currently on Lovenox.  Other migraine without status migrainosus, not intractable Patient will try Excedrin Migraine, patient to follow with neurology.Patient is not given prescription for Imitrex since it has interaction  with his other medications   Return in about 3 months (around 05/13/2015), or if symptoms  worsen or fail to improve.   This note has been created with Surveyor, quantity. Any transcriptional errors are unintentional.    Lorayne Marek, MD

## 2015-02-10 NOTE — Progress Notes (Signed)
Patient complains of having pain to his upper left thigh when he Goes from a kneeli ng position to getting up Patient states it is a shooting pain from his knee to his hip Patient states this has been going on for almost two months Patient also want to know if he can come off lovenex injections and take  Something in a pill form

## 2015-04-08 ENCOUNTER — Ambulatory Visit: Payer: Medicaid Other | Admitting: Diagnostic Neuroimaging

## 2015-04-08 ENCOUNTER — Telehealth: Payer: Self-pay | Admitting: Endocrinology

## 2015-04-08 MED ORDER — CANAGLIFLOZIN 300 MG PO TABS: 300.0000 mg | ORAL_TABLET | Freq: Every day | ORAL | Status: DC

## 2015-04-08 NOTE — Telephone Encounter (Signed)
Pt advised of note below. Pt voiced understanding.

## 2015-04-08 NOTE — Telephone Encounter (Signed)
please call patient: Medicaid wants you to change farxiga to invokana.  i have sent a prescription to your pharmacy i'll see you next time.

## 2015-04-16 ENCOUNTER — Encounter: Payer: Self-pay | Admitting: Diagnostic Neuroimaging

## 2015-04-16 ENCOUNTER — Ambulatory Visit (INDEPENDENT_AMBULATORY_CARE_PROVIDER_SITE_OTHER): Payer: Medicaid Other | Admitting: Diagnostic Neuroimaging

## 2015-04-16 VITALS — BP 119/77 | HR 89 | Ht 71.0 in | Wt 284.0 lb

## 2015-04-16 DIAGNOSIS — R404 Transient alteration of awareness: Secondary | ICD-10-CM | POA: Diagnosis not present

## 2015-04-16 DIAGNOSIS — R930 Abnormal findings on diagnostic imaging of skull and head, not elsewhere classified: Secondary | ICD-10-CM

## 2015-04-16 DIAGNOSIS — R9089 Other abnormal findings on diagnostic imaging of central nervous system: Secondary | ICD-10-CM

## 2015-04-16 NOTE — Patient Instructions (Signed)
Continue current medications. 
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
## 2015-04-25 ENCOUNTER — Ambulatory Visit (INDEPENDENT_AMBULATORY_CARE_PROVIDER_SITE_OTHER): Payer: Medicaid Other | Admitting: Endocrinology

## 2015-04-25 ENCOUNTER — Encounter: Payer: Self-pay | Admitting: Endocrinology

## 2015-04-25 VITALS — BP 132/87 | HR 85 | Temp 98.8°F | Ht 71.0 in | Wt 281.0 lb

## 2015-04-25 DIAGNOSIS — E1165 Type 2 diabetes mellitus with hyperglycemia: Secondary | ICD-10-CM

## 2015-04-25 DIAGNOSIS — IMO0002 Reserved for concepts with insufficient information to code with codable children: Secondary | ICD-10-CM

## 2015-04-25 LAB — POCT GLYCOSYLATED HEMOGLOBIN (HGB A1C): HEMOGLOBIN A1C: 6.2
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
## 2015-04-25 NOTE — Patient Instructions (Addendum)
Try skipping the repaglinide at lunch. Please come back for a follow-up appointment in 4 months. check your blood sugar once a day.  vary the time of day when you check, between before the 3 meals, and at bedtime.  also check if you have symptoms of your blood sugar being too high or too low.  please keep a record of the readings and bring it to your next appointment here.  You can write it on any piece of paper.  please call us sooner if your blood sugar goes below 70, or if you have a lot of readings over 200.

## 2015-05-05 ENCOUNTER — Other Ambulatory Visit: Payer: Self-pay | Admitting: Endocrinology

## 2015-05-12 ENCOUNTER — Ambulatory Visit: Payer: Self-pay

## 2015-05-26 ENCOUNTER — Emergency Department (HOSPITAL_COMMUNITY)
Admission: EM | Admit: 2015-05-26 | Discharge: 2015-05-26 | Disposition: A | Discharge status: Home / Self Care | Payer: Medicaid Other | Attending: Emergency Medicine | Admitting: Emergency Medicine

## 2015-05-26 ENCOUNTER — Emergency Department (HOSPITAL_COMMUNITY): Payer: Medicaid Other

## 2015-05-26 ENCOUNTER — Encounter (HOSPITAL_COMMUNITY): Payer: Self-pay | Admitting: *Deleted

## 2015-05-26 DIAGNOSIS — E669 Obesity, unspecified: Secondary | ICD-10-CM | POA: Diagnosis not present

## 2015-05-26 DIAGNOSIS — R110 Nausea: Secondary | ICD-10-CM | POA: Insufficient documentation

## 2015-05-26 DIAGNOSIS — Z7901 Long term (current) use of anticoagulants: Secondary | ICD-10-CM | POA: Insufficient documentation

## 2015-05-26 DIAGNOSIS — M79662 Pain in left lower leg: Secondary | ICD-10-CM | POA: Diagnosis not present

## 2015-05-26 DIAGNOSIS — E119 Type 2 diabetes mellitus without complications: Secondary | ICD-10-CM | POA: Insufficient documentation

## 2015-05-26 DIAGNOSIS — G43909 Migraine, unspecified, not intractable, without status migrainosus: Secondary | ICD-10-CM | POA: Insufficient documentation

## 2015-05-26 DIAGNOSIS — Z79899 Other long term (current) drug therapy: Secondary | ICD-10-CM | POA: Insufficient documentation

## 2015-05-26 DIAGNOSIS — E785 Hyperlipidemia, unspecified: Secondary | ICD-10-CM | POA: Diagnosis not present

## 2015-05-26 DIAGNOSIS — Z880 Allergy status to penicillin: Secondary | ICD-10-CM | POA: Diagnosis not present

## 2015-05-26 DIAGNOSIS — F419 Anxiety disorder, unspecified: Secondary | ICD-10-CM | POA: Diagnosis not present

## 2015-05-26 DIAGNOSIS — R079 Chest pain, unspecified: Secondary | ICD-10-CM | POA: Insufficient documentation

## 2015-05-26 DIAGNOSIS — Z87442 Personal history of urinary calculi: Secondary | ICD-10-CM | POA: Insufficient documentation

## 2015-05-26 DIAGNOSIS — R2242 Localized swelling, mass and lump, left lower limb: Secondary | ICD-10-CM | POA: Diagnosis not present

## 2015-05-26 DIAGNOSIS — Z87891 Personal history of nicotine dependence: Secondary | ICD-10-CM | POA: Diagnosis not present

## 2015-05-26 DIAGNOSIS — M79605 Pain in left leg: Secondary | ICD-10-CM

## 2015-05-26 DIAGNOSIS — Z87448 Personal history of other diseases of urinary system: Secondary | ICD-10-CM | POA: Insufficient documentation

## 2015-05-26 DIAGNOSIS — Z86718 Personal history of other venous thrombosis and embolism: Secondary | ICD-10-CM | POA: Insufficient documentation

## 2015-05-26 DIAGNOSIS — T148 Other injury of unspecified body region: Secondary | ICD-10-CM | POA: Insufficient documentation

## 2015-05-26 DIAGNOSIS — F329 Major depressive disorder, single episode, unspecified: Secondary | ICD-10-CM | POA: Insufficient documentation

## 2015-05-26 DIAGNOSIS — Z8611 Personal history of tuberculosis: Secondary | ICD-10-CM | POA: Diagnosis not present

## 2015-05-26 DIAGNOSIS — Z8701 Personal history of pneumonia (recurrent): Secondary | ICD-10-CM | POA: Insufficient documentation

## 2015-05-26 DIAGNOSIS — J45909 Unspecified asthma, uncomplicated: Secondary | ICD-10-CM | POA: Insufficient documentation

## 2015-05-26 LAB — I-STAT TROPONIN, ED: Troponin i, poc: 0.01 ng/mL (ref 0.00–0.08)

## 2015-05-26 LAB — CBC WITH DIFFERENTIAL/PLATELET
BASOS ABS: 0 10*3/uL (ref 0.0–0.1)
Basophils Relative: 0 %
EOS ABS: 0.1 10*3/uL (ref 0.0–0.7)
EOS PCT: 1 %
HCT: 46.4 % (ref 39.0–52.0)
Hemoglobin: 15.1 g/dL (ref 13.0–17.0)
LYMPHS PCT: 34 %
Lymphs Abs: 2.3 10*3/uL (ref 0.7–4.0)
MCH: 29.5 pg (ref 26.0–34.0)
MCHC: 32.5 g/dL (ref 30.0–36.0)
MCV: 90.6 fL (ref 78.0–100.0)
Monocytes Absolute: 0.4 10*3/uL (ref 0.1–1.0)
Monocytes Relative: 6 %
Neutro Abs: 3.9 10*3/uL (ref 1.7–7.7)
Neutrophils Relative %: 59 %
PLATELETS: 341 10*3/uL (ref 150–400)
RBC: 5.12 MIL/uL (ref 4.22–5.81)
RDW: 13.3 % (ref 11.5–15.5)
WBC: 6.7 10*3/uL (ref 4.0–10.5)

## 2015-05-26 LAB — BASIC METABOLIC PANEL
ANION GAP: 11 (ref 5–15)
BUN: 17 mg/dL (ref 6–20)
CALCIUM: 9.1 mg/dL (ref 8.9–10.3)
CO2: 23 mmol/L (ref 22–32)
Chloride: 102 mmol/L (ref 101–111)
Creatinine, Ser: 1.23 mg/dL (ref 0.61–1.24)
GFR calc Af Amer: >60 mL/min (ref >60)
GLUCOSE: 217 mg/dL — AB (ref 65–99)
Potassium: 3.8 mmol/L (ref 3.5–5.1)
Sodium: 136 mmol/L (ref 135–145)

## 2015-05-26 MED ORDER — FENTANYL CITRATE (PF) 100 MCG/2ML IJ SOLN
100.0000 ug | Freq: Once | INTRAMUSCULAR | Status: DC
Start: 2015-05-26 — End: 2015-05-26
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
## 2015-05-26 NOTE — ED Notes (Signed)
Pt reports possible blood clot in left lower leg. Pt reports hx of the same in left leg. Pt takes Lovenox at home. Pt denies chest pain, shortness of breath. Site is tender to palpation, pain does not increase with ambulation.

## 2015-05-26 NOTE — Discharge Instructions (Signed)
Return to [facility] tomorrow morning for DVT Ultrasound Study. Return to the emergency department if symptoms worsen or new onset of fever, difficulty breathing, cough, coughing up blood, lower extremity swelling.  Continue taking your Lovenox shots as prescribed.

## 2015-05-26 NOTE — ED Provider Notes (Deleted)
MSE was initiated and I personally evaluated the patient and placed orders (if any) at  9:58 PM on May 26, 2015.  The patient appears stable so that the remainder of the MSE may be completed by another provider.  Pt is a 44 y.o. male with hx of DM, DVT/PE (Lovanox BID with a few recent missed doses) who presents to the Emergency Department complaining of a knot and small area of improved bruising on the left medial calf onset two weeks.  He describes the pain as mildly sharp with touch but dull otherwise.  Denies any known trauma or injury to leg however he notes he works at [employer] and constantly has things hitting him. Per patient, his mother believes his left leg is slightly swollen but he does not believe there is any swelling.  He also reports some mild nausea and multiple several minute episodes of stabbing, central CP that is not-related to activity, onset 2 weeks ago.  His last DVT/PE was in 2012 which presented with severe leg swelling and bruising around the left ankle.  He denies fever, SOB, cough, wheezing, hemoptysis, abdominal pain, vomiting, leg numbness/weakness.   VSS. Exam revealed small area of ecchymosis to left medial calf with 1cm area of induration inferior to ecchymosis. No lower extremity swelling. Cardiac exam unremarkable. Lungs CTAB.   Due to pt's hx of DVT/PE and new onset of CP, leg pain, bruising and induration pt will be transferred to main ED. Orders placed for doppler US, and cardiac workup. Pt is hemodynamically stable at this time.  Chesley Noon [place], [place] 05/26/15 2204  Leonard Schwartz, MD 05/26/15 [phone]

## 2015-05-27 ENCOUNTER — Ambulatory Visit (HOSPITAL_COMMUNITY)
Admission: RE | Admit: 2015-05-27 | Discharge: 2015-05-27 | Disposition: A | Discharge status: Home / Self Care | Payer: Medicaid Other | Source: Ambulatory Visit | Attending: Emergency Medicine | Admitting: Emergency Medicine

## 2015-05-27 DIAGNOSIS — R2242 Localized swelling, mass and lump, left lower limb: Secondary | ICD-10-CM | POA: Insufficient documentation

## 2015-05-27 DIAGNOSIS — M79605 Pain in left leg: Secondary | ICD-10-CM | POA: Diagnosis not present

## 2015-05-27 DIAGNOSIS — I82532 Chronic embolism and thrombosis of left popliteal vein: Secondary | ICD-10-CM | POA: Insufficient documentation

## 2015-05-27 NOTE — Progress Notes (Addendum)
*  PRELIMINARY RESULTS* Vascular Ultrasound Left lower extremity venous duplex has been completed.  Preliminary findings: No evidence of acute DVT or baker's cyst. Chronic thrombus noted in the left popliteal vein.   Landry Mellow, RDMS, RVT  05/27/2015, 9:27 AM

## 2015-07-09 ENCOUNTER — Ambulatory Visit: Payer: Medicaid Other | Attending: Family Medicine | Admitting: Family Medicine

## 2015-07-09 ENCOUNTER — Encounter: Payer: Self-pay | Admitting: Family Medicine

## 2015-07-09 VITALS — BP 100/64 | HR 79 | Temp 97.8°F | Resp 16 | Ht 71.0 in | Wt 281.0 lb

## 2015-07-09 DIAGNOSIS — I82409 Acute embolism and thrombosis of unspecified deep veins of unspecified lower extremity: Secondary | ICD-10-CM | POA: Insufficient documentation

## 2015-07-09 DIAGNOSIS — F339 Major depressive disorder, recurrent, unspecified: Secondary | ICD-10-CM | POA: Diagnosis not present

## 2015-07-09 DIAGNOSIS — Z87891 Personal history of nicotine dependence: Secondary | ICD-10-CM | POA: Diagnosis not present

## 2015-07-09 DIAGNOSIS — E785 Hyperlipidemia, unspecified: Secondary | ICD-10-CM

## 2015-07-09 DIAGNOSIS — Z79899 Other long term (current) drug therapy: Secondary | ICD-10-CM | POA: Diagnosis not present

## 2015-07-09 DIAGNOSIS — Z0000 Encounter for general adult medical examination without abnormal findings: Secondary | ICD-10-CM | POA: Insufficient documentation

## 2015-07-09 DIAGNOSIS — E1165 Type 2 diabetes mellitus with hyperglycemia: Secondary | ICD-10-CM | POA: Insufficient documentation

## 2015-07-09 DIAGNOSIS — R229 Localized swelling, mass and lump, unspecified: Secondary | ICD-10-CM | POA: Diagnosis not present

## 2015-07-09 DIAGNOSIS — Z7984 Long term (current) use of oral hypoglycemic drugs: Secondary | ICD-10-CM | POA: Diagnosis not present

## 2015-07-09 DIAGNOSIS — E1159 Type 2 diabetes mellitus with other circulatory complications: Secondary | ICD-10-CM

## 2015-07-09 LAB — GLUCOSE, POCT (MANUAL RESULT ENTRY): POC GLUCOSE: 105 mg/dL — AB (ref 70–99)

## 2015-07-09 LAB — POCT GLYCOSYLATED HEMOGLOBIN (HGB A1C): Hemoglobin A1C: 6.1

## 2015-07-09 MED ORDER — ENOXAPARIN SODIUM 150 MG/ML ~~LOC~~ SOLN: 150.0000 mg | Freq: Two times a day (BID) | SUBCUTANEOUS | Status: DC

## 2015-07-09 MED ORDER — GABAPENTIN 300 MG PO CAPS: 300.0000 mg | ORAL_CAPSULE | Freq: Three times a day (TID) | ORAL | Status: DC

## 2015-07-09 MED ORDER — METFORMIN HCL 1000 MG PO TABS: 1000.0000 mg | ORAL_TABLET | Freq: Two times a day (BID) | ORAL | Status: DC

## 2015-07-09 MED ORDER — REPAGLINIDE 2 MG PO TABS: 2.0000 mg | ORAL_TABLET | Freq: Two times a day (BID) | ORAL | Status: DC

## 2015-07-09 MED ORDER — SIMVASTATIN 80 MG PO TABS: 80.0000 mg | ORAL_TABLET | Freq: Every day | ORAL | Status: DC

## 2015-07-09 NOTE — Progress Notes (Signed)
F/U depression DM Pt stated no changes on medication  C/C knots on lt leg no pain x2 month  No tobacco user  No thoughts of hurting himself or suicide

## 2015-07-09 NOTE — Assessment & Plan Note (Signed)
Treatment and medication refills deferred to psychiatry

## 2015-07-09 NOTE — Assessment & Plan Note (Signed)
A: benign appearing nodule P: Reassurance CT leg if pain, enlarging, skin changes

## 2015-07-09 NOTE — Assessment & Plan Note (Signed)
Controlled Followed by endo Continue current treatment Patient is not checking CBGs
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
## 2015-07-09 NOTE — Patient Instructions (Addendum)
Bryan Mcmillan was seen today for diabetes and depression.  Diagnoses and all orders for this visit:  Controlled type 2 diabetes mellitus with other circulatory complication, without long-term current use of insulin (HCC) -     POCT glycosylated hemoglobin (Hb A1C) -     POCT glucose (manual entry) -     gabapentin (NEURONTIN) 300 MG capsule; Take 1 capsule (300 mg total) by mouth 3 (three) times daily. -     metFORMIN (GLUCOPHAGE) 1000 MG tablet; Take 1 tablet (1,000 mg total) by mouth 2 (two) times daily with a meal. -     repaglinide (PRANDIN) 2 MG tablet; Take 1 tablet (2 mg total) by mouth 2 (two) times daily before a meal.  DVT (deep venous thrombosis), unspecified laterality -     enoxaparin (LOVENOX) 150 MG/ML injection; Inject 1 mL (150 mg total) into the skin every 12 (twelve) hours. For blood clots  Hyperlipemia -     simvastatin (ZOCOR) 80 MG tablet; Take 1 tablet (80 mg total) by mouth daily.  Healthcare maintenance -     Flu Vaccine QUAD 36+ mos IM  Subcutaneous nodule  Recurrent major depressive disorder, remission status unspecified ([place])   Call if leg nodule enlarges, becomes painful or you develop skin changes.  F/u in 3 months for diabetes   Dr. Adrian Blackwater

## 2015-08-05 MED FILL — VIIBRYD 40 MG TABLET: 40 | 30 days supply | Qty: 30 | Fill #1

## 2015-08-05 MED FILL — GABAPENTIN 300 MG CAPSULE: 300 | 30 days supply | Qty: 90 | Fill #1

## 2015-08-07 MED FILL — traZODone HCL 150 MG TABS: 150 | 30 days supply | Qty: 30 | Fill #0

## 2015-08-07 MED FILL — LATUDA 60 MG TABLET: 60 | 30 days supply | Qty: 30 | Fill #0

## 2015-08-25 ENCOUNTER — Encounter: Payer: Self-pay | Admitting: Endocrinology

## 2015-08-25 ENCOUNTER — Ambulatory Visit (INDEPENDENT_AMBULATORY_CARE_PROVIDER_SITE_OTHER): Payer: Medicaid Other | Admitting: Endocrinology

## 2015-08-25 VITALS — BP 130/82 | HR 87 | Temp 97.7°F | Ht 71.0 in | Wt 282.0 lb

## 2015-08-25 DIAGNOSIS — E1159 Type 2 diabetes mellitus with other circulatory complications: Secondary | ICD-10-CM

## 2015-08-25 MED FILL — INVOKANA 300 MG TABLET: 300 | 30 days supply | Qty: 30 | Fill #3

## 2015-08-25 NOTE — Patient Instructions (Addendum)
Please continue the same medications.   Please come back for a follow-up appointment in 4 months.  check your blood sugar once a day.  vary the time of day when you check, between before the 3 meals, and at bedtime.  also check if you have symptoms of your blood sugar being too high or too low.  please keep a record of the readings and bring it to your next appointment here.  You can write it on any piece of paper.  please call us sooner if your blood sugar goes below 70, or if you have a lot of readings over 200.
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
## 2015-09-05 MED FILL — BROMOCRIPTINE 2.5 MG TABLET: 2.5 | 30 days supply | Qty: 15 | Fill #7

## 2015-09-05 MED FILL — PIOGLITAZONE HCL 45 MG TAB: 45 | 30 days supply | Qty: 30 | Fill #3 | Status: TO

## 2015-09-05 MED FILL — SIMVASTATIN 80 MG TABLET: 80 | 30 days supply | Qty: 30 | Fill #1

## 2015-09-05 MED FILL — GABAPENTIN 300 MG CAPSULE: 300 | 30 days supply | Qty: 90 | Fill #2

## 2015-09-08 MED FILL — VIIBRYD 40 MG TABLET: 40 | 30 days supply | Qty: 30 | Fill #0

## 2015-09-23 MED FILL — REXULTI 2 MG TABLET: 2 | 30 days supply | Qty: 30 | Fill #0

## 2015-09-24 MED FILL — traZODone HCL 150 MG TABS: 150 | 30 days supply | Qty: 30 | Fill #1

## 2015-09-29 MED FILL — REPAGLINIDE 2 MG TABLET: 2 | 30 days supply | Qty: 60 | Fill #1

## 2015-09-29 MED FILL — GABAPENTIN 300 MG CAPSULE: 300 | 30 days supply | Qty: 90 | Fill #3

## 2015-10-06 MED FILL — SIMVASTATIN 80 MG TABLET: 80 | 30 days supply | Qty: 30 | Fill #2

## 2015-10-06 MED FILL — VIIBRYD 40 MG TABLET: 40 | 30 days supply | Qty: 30 | Fill #1

## 2015-10-06 MED FILL — INVOKANA 300 MG TABLET: 300 | 30 days supply | Qty: 30 | Fill #4

## 2015-10-06 MED FILL — busPIRone HCL 10 MG TABS: 10 | 30 days supply | Qty: 90 | Fill #0

## 2015-10-06 MED FILL — BROMOCRIPTINE 2.5 MG TABLET: 2.5 | 30 days supply | Qty: 15 | Fill #8

## 2015-10-06 MED FILL — PIOGLITAZONE HCL 45 MG TAB: 45 | 30 days supply | Qty: 30 | Fill #4 | Status: TO

## 2015-10-13 ENCOUNTER — Telehealth: Payer: Self-pay | Admitting: Endocrinology

## 2015-10-13 NOTE — Telephone Encounter (Signed)
Returned pt's call. He said that he has not been checking his b/s because it has not been a problem, until the last week. He still didn't check it, he just knows it was low (can't remember when it was low). Advised pt that he needs to be checking his sugars when he is feeling like they are low. Please advise.

## 2015-10-13 NOTE — Telephone Encounter (Signed)
Pt has had some lows lately but he doesn't know what they are dropping to. He hasnt been checking his BS

## 2015-10-14 NOTE — Telephone Encounter (Signed)
Spoke to pt, told him to reduce Repaglinide to 1/2 a talblet at a time per Dr.Ellison and he will see you next time. Pt verbalized understanding.

## 2015-10-14 NOTE — Telephone Encounter (Signed)
Bryan Mcmillan pt 

## 2015-10-14 NOTE — Telephone Encounter (Signed)
Please read message below and advise.  

## 2015-10-14 NOTE — Telephone Encounter (Signed)
Please reduce the repaglinide to 1/2 pill at a time i'll see you next time.

## 2015-10-20 MED FILL — REXULTI 2 MG TABLET: 2 | 30 days supply | Qty: 30 | Fill #1

## 2015-10-20 MED FILL — traZODone HCL 150 MG TABS: 150 | 30 days supply | Qty: 30 | Fill #2

## 2015-10-27 MED FILL — GABAPENTIN 300 MG CAPSULE: 300 | 30 days supply | Qty: 90 | Fill #4

## 2015-11-04 MED FILL — VIIBRYD 40 MG TABLET: 40 | 30 days supply | Qty: 30 | Fill #0

## 2015-11-04 MED FILL — busPIRone HCL 10 MG TABS: 10 | 30 days supply | Qty: 90 | Fill #0

## 2015-11-10 MED FILL — INVOKANA 300 MG TABLET: 300 | 30 days supply | Qty: 30 | Fill #5

## 2015-11-10 MED FILL — PIOGLITAZONE HCL 45 MG TAB: 45 | 30 days supply | Qty: 30 | Fill #5 | Status: TO

## 2015-11-10 MED FILL — BROMOCRIPTINE 2.5 MG TABLET: 2.5 | 30 days supply | Qty: 15 | Fill #9

## 2015-11-10 MED FILL — REPAGLINIDE 2 MG TABLET: 2 | 30 days supply | Qty: 60 | Fill #2

## 2015-11-17 MED FILL — GABAPENTIN 300 MG CAPSULE: 300 | 30 days supply | Qty: 90 | Fill #5

## 2015-11-17 MED FILL — SIMVASTATIN 80 MG TABLET: 80 | 30 days supply | Qty: 30 | Fill #3

## 2015-11-17 MED FILL — REXULTI 2 MG TABLET: 2 | 30 days supply | Qty: 30 | Fill #2

## 2015-11-24 MED FILL — traZODone HCL 150 MG TABS: 150 | 30 days supply | Qty: 30 | Fill #0

## 2015-12-08 MED FILL — REPAGLINIDE 2 MG TABLET: 2 | 30 days supply | Qty: 60 | Fill #3

## 2015-12-08 MED FILL — VIIBRYD 40 MG TABLET: 40 | 30 days supply | Qty: 30 | Fill #1

## 2015-12-08 MED FILL — GABAPENTIN 300 MG CAPSULE: 300 | 30 days supply | Qty: 120 | Fill #0

## 2015-12-15 ENCOUNTER — Other Ambulatory Visit: Payer: Self-pay | Admitting: Endocrinology

## 2015-12-15 MED FILL — INVOKANA 300 MG TABLET: 300 | 30 days supply | Qty: 30 | Fill #6

## 2015-12-15 MED FILL — BROMOCRIPTINE 2.5 MG TABLET: 2.5 | 30 days supply | Qty: 15 | Fill #0 | Status: TO

## 2015-12-15 MED FILL — PIOGLITAZONE HCL 45 MG TAB: 45 | 30 days supply | Qty: 30 | Fill #6 | Status: TO

## 2015-12-16 MED FILL — LOVENOX 150 MG PREFILLED SY: 150 | 30 days supply | Qty: 60 | Fill #1

## 2015-12-20 IMAGING — CR DG CHEST 2V
2 series · 2 of 2 positions shown · non-contrast
Comparison: 07/20/2014

CLINICAL DATA: Possible blood clot in left lower leg.

EXAM:
CHEST  2 VIEW

[chest pa]
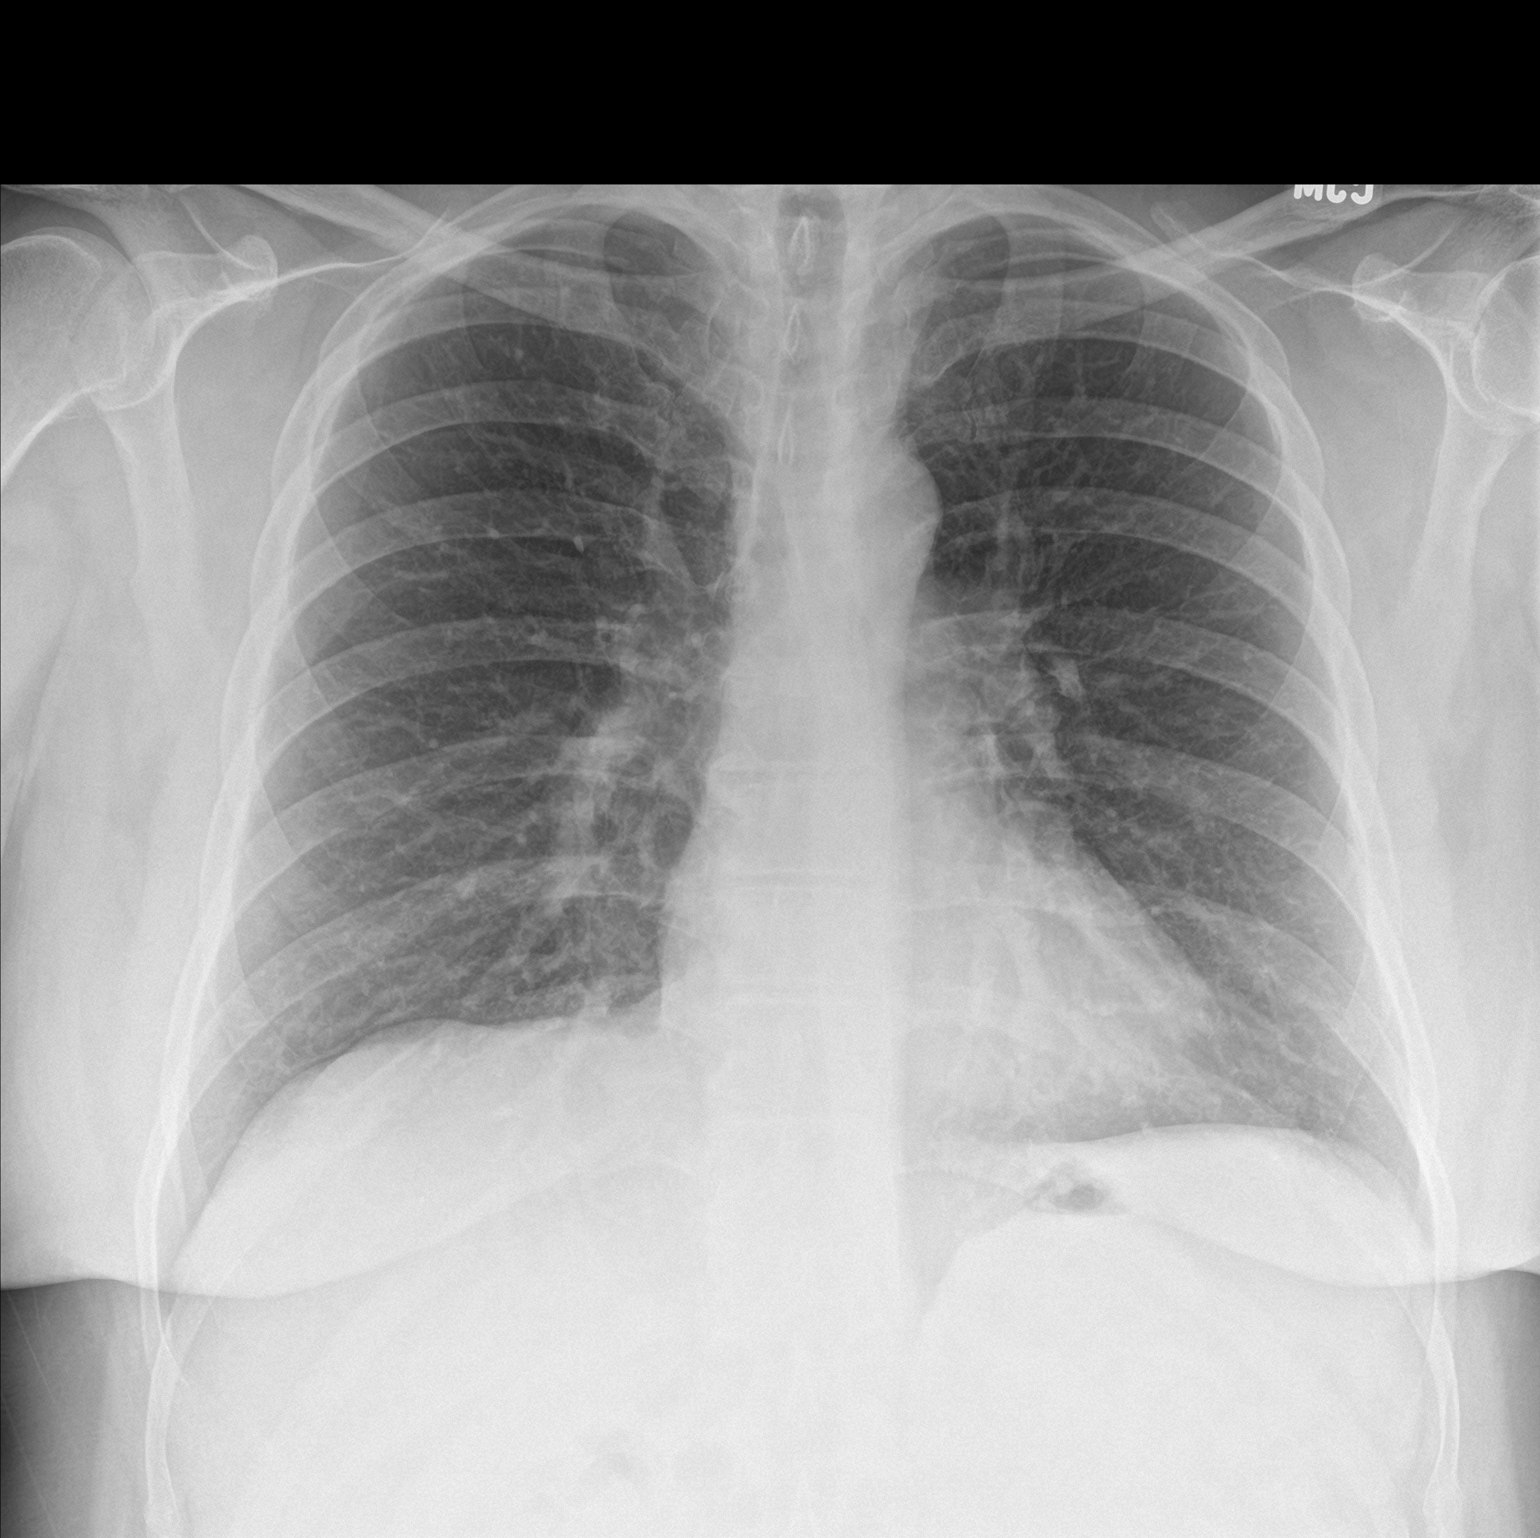

[chest lat]
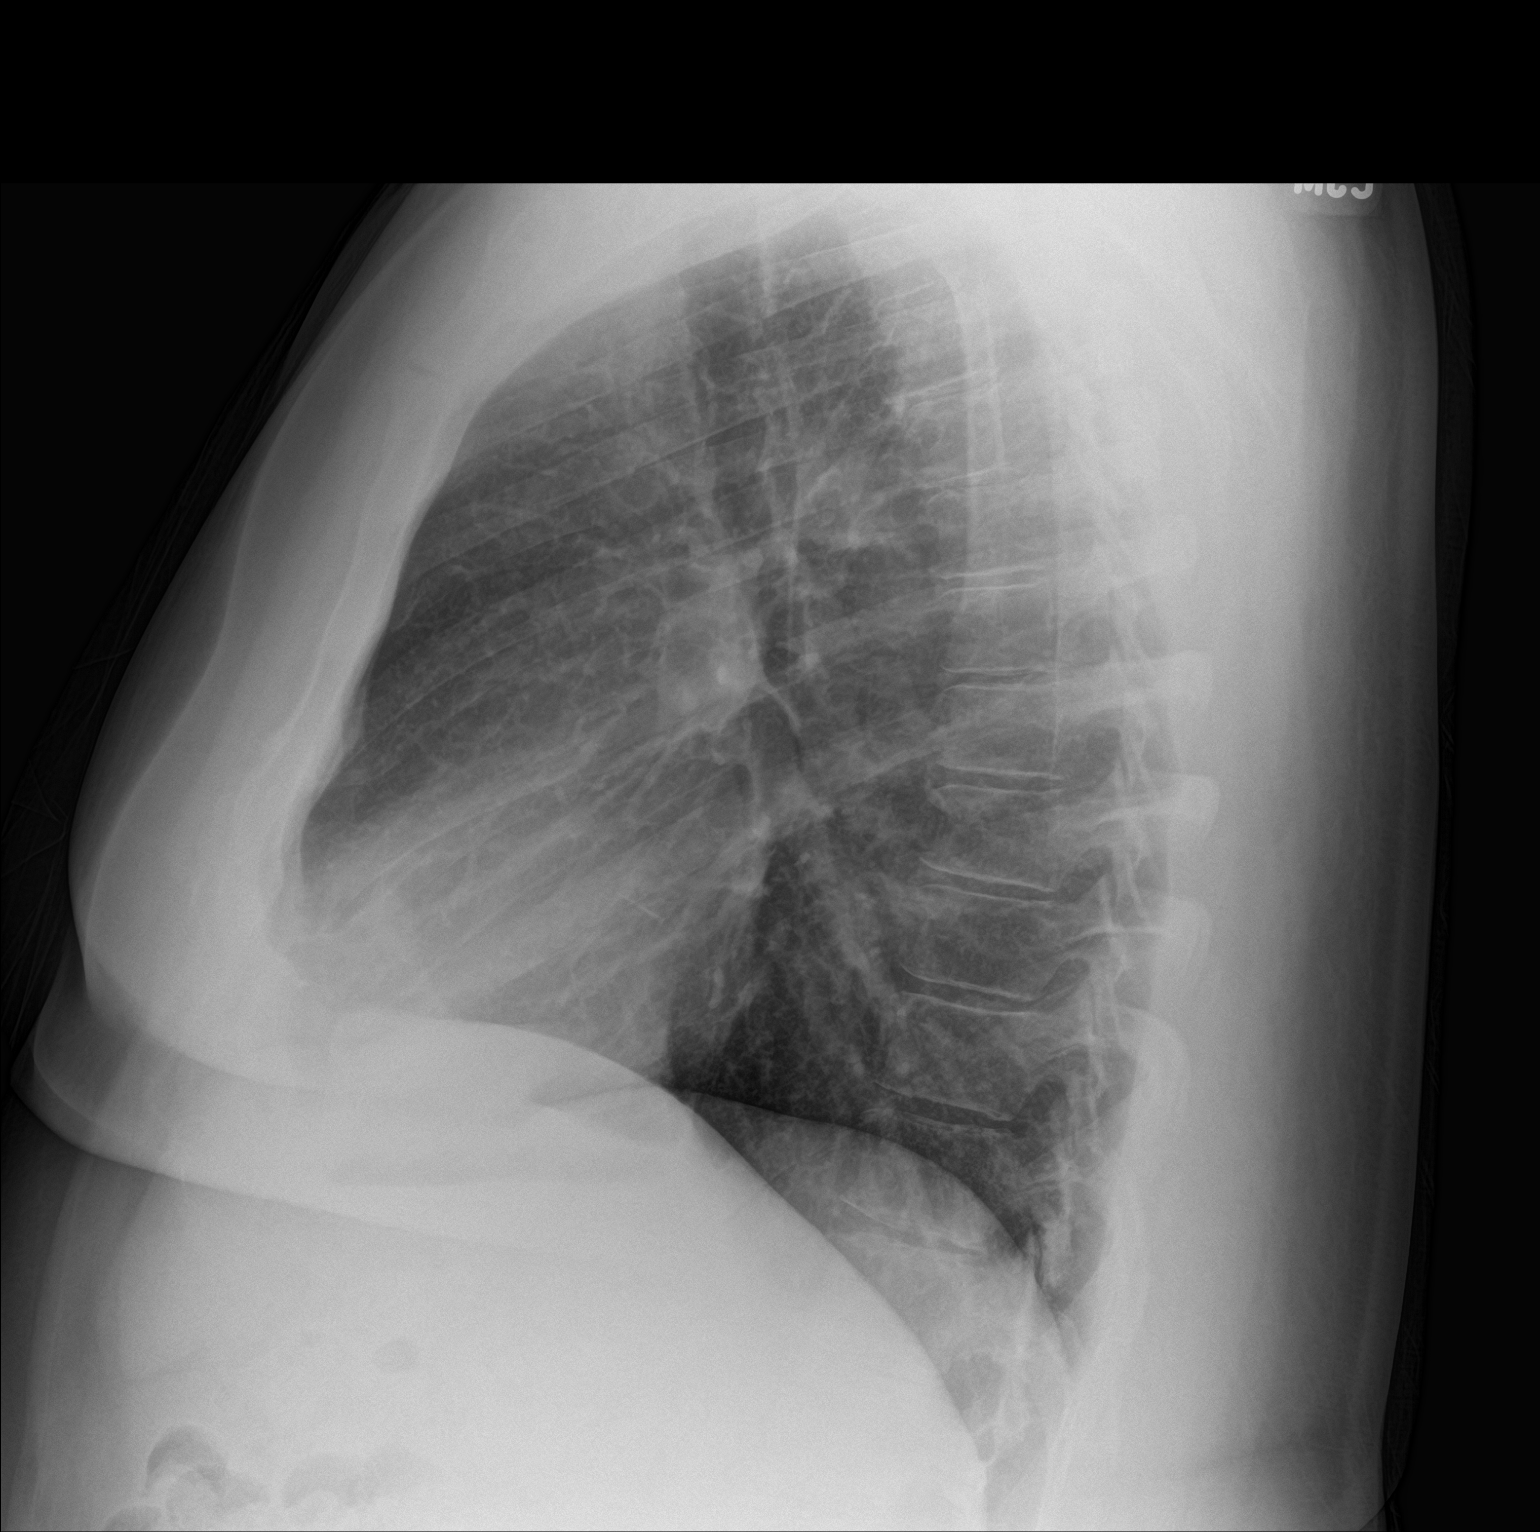

[2 of 2 positions shown; findings below may reference images not displayed]

FINDINGS: The heart size and mediastinal contours are within normal limits.
Both lungs are clear. The visualized skeletal structures are
unremarkable.
IMPRESSION: No active cardiopulmonary disease.

## 2015-12-22 MED FILL — traZODone HCL 150 MG TABS: 150 | 30 days supply | Qty: 30 | Fill #1

## 2015-12-22 MED FILL — SIMVASTATIN 80 MG TABLET: 80 | 30 days supply | Qty: 30 | Fill #4

## 2015-12-22 MED FILL — REXULTI 2 MG TABLET: 2 | 30 days supply | Qty: 30 | Fill #0

## 2015-12-23 ENCOUNTER — Ambulatory Visit (INDEPENDENT_AMBULATORY_CARE_PROVIDER_SITE_OTHER): Payer: Medicaid Other | Admitting: Endocrinology

## 2015-12-23 ENCOUNTER — Encounter: Payer: Self-pay | Admitting: Endocrinology

## 2015-12-23 VITALS — BP 132/70 | HR 79 | Temp 98.6°F | Ht 71.0 in | Wt 286.0 lb

## 2015-12-23 DIAGNOSIS — E1159 Type 2 diabetes mellitus with other circulatory complications: Secondary | ICD-10-CM

## 2015-12-23 LAB — POCT GLYCOSYLATED HEMOGLOBIN (HGB A1C): Hemoglobin A1C: 6

## 2015-12-23 MED ORDER — BROMOCRIPTINE MESYLATE 2.5 MG PO TABS
2.5000 mg | ORAL_TABLET | Freq: Every day | ORAL | Status: DC
Start: 2015-12-23 — End: 2019-06-04

## 2015-12-23 MED ORDER — REPAGLINIDE 1 MG PO TABS: 1.0000 mg | ORAL_TABLET | Freq: Two times a day (BID) | ORAL | Status: DC
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
## 2015-12-23 NOTE — Patient Instructions (Addendum)
Please reduce the repaglinide to 1 mg twice a day (breakfast and supper) Please come back for a follow-up appointment in 6 months.  check your blood sugar once a day.  vary the time of day when you check, between before the 3 meals, and at bedtime.  also check if you have symptoms of your blood sugar being too high or too low.  please keep a record of the readings and bring it to your next appointment here.  You can write it on any piece of paper.  please call us sooner if your blood sugar goes below 70, or if you have a lot of readings over 200.

## 2016-01-12 MED FILL — INVOKANA 300 MG TABLET: 300 | 30 days supply | Qty: 30 | Fill #7

## 2016-01-12 MED FILL — BROMOCRIPTINE 2.5 MG TABLET: 2.5 | 30 days supply | Qty: 15 | Fill #1 | Status: TO

## 2016-01-12 MED FILL — GABAPENTIN 300 MG CAPSULE: 300 | 30 days supply | Qty: 120 | Fill #1

## 2016-01-12 MED FILL — PIOGLITAZONE HCL 45 MG TAB: 45 | 30 days supply | Qty: 30 | Fill #7 | Status: TO

## 2016-01-12 MED FILL — REPAGLINIDE 2 MG TABLET: 2 | 30 days supply | Qty: 60 | Fill #4

## 2016-01-12 MED FILL — VIIBRYD 40 MG TABLET: 40 | 30 days supply | Qty: 30 | Fill #2

## 2016-01-26 MED FILL — traZODone HCL 150 MG TABS: 150 | 30 days supply | Qty: 30 | Fill #2

## 2016-01-26 MED FILL — SIMVASTATIN 80 MG TABLET: 80 | 30 days supply | Qty: 30 | Fill #5

## 2016-01-27 MED FILL — busPIRone HCL 10 MG TABS: 10 | 30 days supply | Qty: 90 | Fill #0

## 2016-02-09 MED FILL — VIIBRYD 40 MG TABLET: 40 | 30 days supply | Qty: 30 | Fill #0

## 2016-02-09 MED FILL — GABAPENTIN 300 MG CAPSULE: 300 | 30 days supply | Qty: 120 | Fill #0 | Status: TO

## 2016-02-16 MED FILL — LOVENOX 150 MG PREFILLED SY: 150 | 30 days supply | Qty: 60 | Fill #2 | Status: TO

## 2016-02-16 MED FILL — REPAGLINIDE 2 MG TABLET: 2 | 30 days supply | Qty: 60 | Fill #5

## 2016-02-16 MED FILL — BROMOCRIPTINE 2.5 MG TABLET: 2.5 | 30 days supply | Qty: 15 | Fill #2 | Status: TO

## 2016-02-16 MED FILL — PIOGLITAZONE HCL 45 MG TAB: 45 | 30 days supply | Qty: 30 | Fill #8 | Status: TO

## 2016-02-24 MED FILL — INVOKANA 300 MG TABLET: 300 | 30 days supply | Qty: 30 | Fill #8 | Status: TO

## 2016-02-24 MED FILL — traZODone HCL 150 MG TABS: 150 | 30 days supply | Qty: 30 | Fill #0 | Status: TO

## 2016-03-04 MED FILL — REXULTI 2 MG TABLET: 2 | 30 days supply | Qty: 30 | Fill #0

## 2016-03-15 ENCOUNTER — Other Ambulatory Visit: Payer: Self-pay | Admitting: Family Medicine

## 2016-03-15 DIAGNOSIS — E785 Hyperlipidemia, unspecified: Secondary | ICD-10-CM

## 2016-03-15 DIAGNOSIS — E1159 Type 2 diabetes mellitus with other circulatory complications: Secondary | ICD-10-CM

## 2016-03-15 MED FILL — busPIRone HCL 10 MG TABS: 10 | 30 days supply | Qty: 90 | Fill #1

## 2016-03-15 MED FILL — VIIBRYD 40 MG TABLET: 40 | 30 days supply | Qty: 30 | Fill #1

## 2016-03-15 MED FILL — BROMOCRIPTINE 2.5 MG TABLET: 2.5 | 30 days supply | Qty: 15 | Fill #3 | Status: TO

## 2016-03-18 ENCOUNTER — Other Ambulatory Visit: Payer: Self-pay | Admitting: Family Medicine

## 2016-03-18 DIAGNOSIS — E1159 Type 2 diabetes mellitus with other circulatory complications: Secondary | ICD-10-CM

## 2016-03-18 MED FILL — SIMVASTATIN 80 MG TABLET: 80 | 30 days supply | Qty: 30 | Fill #0 | Status: TO

## 2016-03-18 MED FILL — GABAPENTIN 300 MG CAPSULE: 300 | 30 days supply | Qty: 120 | Fill #2

## 2016-04-09 ENCOUNTER — Telehealth: Payer: Self-pay | Admitting: Endocrinology

## 2016-04-09 DIAGNOSIS — E1159 Type 2 diabetes mellitus with other circulatory complications: Secondary | ICD-10-CM

## 2016-04-09 MED ORDER — METFORMIN HCL 1000 MG PO TABS: 1000.0000 mg | ORAL_TABLET | Freq: Two times a day (BID) | ORAL | 2 refills | Status: DC

## 2016-04-09 MED ORDER — PIOGLITAZONE HCL 45 MG PO TABS: 45.0000 mg | ORAL_TABLET | Freq: Every day | ORAL | 2 refills | Status: DC

## 2016-04-09 MED ORDER — CANAGLIFLOZIN 300 MG PO TABS: 300.0000 mg | ORAL_TABLET | Freq: Every day | ORAL | 2 refills | Status: DC

## 2016-04-09 MED ORDER — REPAGLINIDE 1 MG PO TABS: 1.0000 mg | ORAL_TABLET | Freq: Two times a day (BID) | ORAL | 2 refills | Status: DC

## 2016-04-09 NOTE — Telephone Encounter (Signed)
Refills submitted.  

## 2016-04-09 NOTE — Telephone Encounter (Signed)
Pt has moved to [place] and needs refills of his meds to hold him over until he sees a new endo MD out there   Needs to be called into [phone] [facility]

## 2016-04-19 ENCOUNTER — Ambulatory Visit: Payer: Medicaid Other | Admitting: Diagnostic Neuroimaging

## 2016-06-28 ENCOUNTER — Ambulatory Visit: Payer: Self-pay | Admitting: Endocrinology

## 2016-07-13 ENCOUNTER — Other Ambulatory Visit: Payer: Self-pay | Admitting: Family Medicine

## 2016-07-13 ENCOUNTER — Other Ambulatory Visit: Payer: Self-pay | Admitting: Endocrinology

## 2016-07-13 DIAGNOSIS — E785 Hyperlipidemia, unspecified: Secondary | ICD-10-CM

## 2016-07-13 NOTE — Telephone Encounter (Signed)
Please refill x 3 mos Ov is due 

## 2016-08-11 ENCOUNTER — Other Ambulatory Visit: Payer: Self-pay | Admitting: Endocrinology

## 2016-08-11 NOTE — Telephone Encounter (Signed)
Please refill x 1 Ov is due  

## 2016-08-25 ENCOUNTER — Other Ambulatory Visit: Payer: Self-pay | Admitting: Family Medicine

## 2016-08-25 DIAGNOSIS — E785 Hyperlipidemia, unspecified: Secondary | ICD-10-CM

## 2016-09-22 ENCOUNTER — Other Ambulatory Visit: Payer: Self-pay | Admitting: Endocrinology

## 2016-10-18 ENCOUNTER — Other Ambulatory Visit: Payer: Self-pay | Admitting: Endocrinology

## 2016-10-18 NOTE — Telephone Encounter (Signed)
Please refill x 1 Ov is due  

## 2017-01-25 ENCOUNTER — Other Ambulatory Visit: Payer: Self-pay | Admitting: Endocrinology

## 2017-04-23 ENCOUNTER — Other Ambulatory Visit: Payer: Self-pay | Admitting: Endocrinology

## 2019-05-01 ENCOUNTER — Emergency Department (HOSPITAL_COMMUNITY)
Admission: EM | Admit: 2019-05-01 | Discharge: 2019-05-01 | Disposition: A | Discharge status: Home / Self Care | Payer: BLUE CROSS/BLUE SHIELD | Attending: Emergency Medicine | Admitting: Emergency Medicine

## 2019-05-01 ENCOUNTER — Other Ambulatory Visit: Payer: Self-pay

## 2019-05-01 ENCOUNTER — Emergency Department (HOSPITAL_COMMUNITY): Payer: BLUE CROSS/BLUE SHIELD

## 2019-05-01 ENCOUNTER — Encounter (HOSPITAL_COMMUNITY): Payer: Self-pay | Admitting: Emergency Medicine

## 2019-05-01 DIAGNOSIS — M25511 Pain in right shoulder: Secondary | ICD-10-CM | POA: Insufficient documentation

## 2019-05-01 DIAGNOSIS — Z86718 Personal history of other venous thrombosis and embolism: Secondary | ICD-10-CM | POA: Insufficient documentation

## 2019-05-01 DIAGNOSIS — Z7984 Long term (current) use of oral hypoglycemic drugs: Secondary | ICD-10-CM | POA: Insufficient documentation

## 2019-05-01 DIAGNOSIS — Z79899 Other long term (current) drug therapy: Secondary | ICD-10-CM | POA: Insufficient documentation

## 2019-05-01 DIAGNOSIS — E119 Type 2 diabetes mellitus without complications: Secondary | ICD-10-CM | POA: Diagnosis not present

## 2019-05-01 MED ORDER — LIDOCAINE 5 % EX PTCH: 1.0000 | MEDICATED_PATCH | CUTANEOUS | 0 refills | Status: DC

## 2019-05-01 NOTE — Discharge Instructions (Addendum)
You have been diagnosed today with right shoulder pain.  At this time there does not appear to be the presence of an emergent medical condition, however there is always the potential for conditions to change. Please read and follow the below instructions.  Please return to the Emergency Department immediately for any new or worsening symptoms. Please be sure to follow up with your Primary Care Provider within one week regarding your visit today; please call their office to schedule an appointment even if you are feeling better for a follow-up visit. Your x-ray today showed a small calcification slightly superior to the lateral humeral head, possibly due to calcific tendinosis.  Please call the orthopedic specialist Dr. Mardelle Matte on your discharge paperwork for follow-up regarding your left shoulder pain and the tendon calcification. Please take Ibuprofen (Advil, motrin) and Tylenol (acetaminophen) to relieve your pain.  You may take up to 400 MG (2 pills) of normal strength ibuprofen every 8 hours as needed.  In between doses of ibuprofen you make take tylenol, up to 500 mg (one extra strength pill).  Do not take more than 3,000 mg tylenol in a 24 hour period.  Please check all medication labels as many medications such as pain and cold medications may contain tylenol.  Do not drink alcohol while taking these medications.  Do not take other NSAID'S while taking ibuprofen (such as aleve or naproxen).  Please take ibuprofen with food to decrease stomach upset. You may use the Lidoderm patches prescribed you today to help with your symptoms. You may use a sling provided to you today to help with your symptoms.  Please be sure to utilize the range of motion exercises discussed today to avoid frozen shoulder.  Get help right away if: Your arm, hand, or fingers: Tingle. Are numb. Are swollen. Are painful. Turn white or blue. You have neck pain You have chest pain or shortness of breath Any new/concerning  or worsening symptoms.  Please read the additional information packets attached to your discharge summary.  Do not take your medicine if  develop an itchy rash, swelling in your mouth or lips, or difficulty breathing; call 911 and seek immediate emergency medical attention if this occurs.  Note: Portions of this text may have been transcribed using voice recognition software. Every effort was made to ensure accuracy; however, inadvertent computerized transcription errors may still be present.

## 2019-05-01 NOTE — ED Triage Notes (Signed)
Pt states he woke up two days ago with right shoulder pain that has gotten worse. Pt states position of comfort is at 90 degrees- pt has pain when abducting arm as well as extending his arm out. Pt placed in sling per request. No injury he knows of

## 2019-05-01 NOTE — ED Notes (Signed)
Reports does not remember injuring shoulder over weekend.

## 2019-05-01 NOTE — ED Provider Notes (Signed)
[facility] EMERGENCY DEPARTMENT Provider Note   CSN: [id number] Arrival date & time: 05/01/19  0830     History   Chief Complaint Chief Complaint  Patient presents with  . Shoulder Pain    HPI Bryan Mcmillan is a 48 y.o. male with history of diabetes, seizures, obesity, migraines, kidney stones, DVT presents today for right shoulder pain.  Patient reports 2-3 days of right shoulder pain, he does not remember a particular injury and believes he may have slept on the shoulder incorrectly.  He describes a moderate intensity aching sensation to the anterior lateral aspect of the right shoulder, pain is worsened with movement and without alleviating factors, no medications prior to arrival.  He denies radiation of pain.  After arrival to the ER patient was placed in sling and reports great improvement of pain following this.  Denies history of fever/chills, headache/vision changes, neck pain, back pain, chest pain/shortness of breath, cough, nausea/vomiting, abdominal pain, numbness/tingling, weakness, swelling/discoloration, fall/injury or any additional concerns.  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     HPI  Past Medical History:  Diagnosis Date  . Anxiety   . Asthma   . Depression   . Diabetes ([place])   . DVT (deep venous thrombosis), hx of recurrent 05/23/2012  . Headache(784.0)    frequent  . History of blood clot in brain, 2012 - followed by [facility] 05/23/2012  . History of blood clots   . History of suicidal tendencies   . Hyperlipemia 08/25/2012  . Hyperlipidemia   . Kidney disease   . Kidney stones   . Migraines   . Obesity   . Pneumonia   . Seizure ([place])   . Seizure disorder - followed by [facility] 05/23/2012  . Sleep apnea   . Tuberculosis     Patient Active Problem List   Diagnosis Date Noted  . Subcutaneous nodule 07/09/2015  . Chest pain due to psychological stress 11/21/2013  . Major  depressive disorder, recurrent episode, severe, without mention of psychotic behavior 09/24/2013  . Speech abnormality 07/11/2013  . Major depression, recurrent ([place]) 03/20/2013  . Brain tumor, glioma ([place]) 11/08/2012  . Hyperlipemia 08/25/2012  . Migraine -followed by [facility] 08/25/2012  . Diabetes type 2, controlled ([place]) 08/25/2012  . DVT (deep venous thrombosis), hx of recurrent 05/23/2012  . Pulmonary embolism ([place]) 05/23/2012  . Seizure disorder - followed by [facility] 05/23/2012    Past Surgical History:  Procedure Laterality Date  . BRAIN SURGERY    . filter for blood clots          Home Medications    Prior to Admission medications   Medication Sig Start Date End Date Taking? Authorizing Provider  bromocriptine (PARLODEL) 2.5 MG tablet Take 1 tablet (2.5 mg total) by mouth daily. 12/23/15   Renato Shin, MD  busPIRone (BUSPAR) 10 MG tablet Take 10 mg by mouth 2 (two) times daily.  04/18/14   [provider]  enoxaparin (LOVENOX) 150 MG/ML injection Inject 1 mL (150 mg total) into the skin every 12 (twelve) hours. For blood clots 07/09/15   Funches, Adriana Mccallum, MD  gabapentin (NEURONTIN) 300 MG capsule Take 1 capsule (300 mg total) by mouth 3 (three) times daily. 07/09/15   Funches, Adriana Mccallum, MD  INVOKANA 300 MG TABS tablet TAKE 1 TABLET BY MOUTH ONCE DAILY 04/23/17   Renato Shin, MD  lidocaine (LIDODERM) 5 % Place 1 patch onto the skin  daily. Remove & Discard patch within 12 hours or as directed by MD 05/01/19   Deliah Boston, PA-C  Lurasidone HCl 20 MG TABS Take 60 mg by mouth.     [provider]  metFORMIN (GLUCOPHAGE) 1000 MG tablet Take 1 tablet (1,000 mg total) by mouth 2 (two) times daily with a meal. 04/09/16   Renato Shin, MD  pioglitazone (ACTOS) 45 MG tablet TAKE 1 TABLET BY MOUTH ONCE DAILY 10/19/16   Renato Shin, MD  repaglinide (PRANDIN) 1 MG tablet Take 1 tablet (1 mg total) by mouth 2 (two) times daily before a meal. 04/09/16    Renato Shin, MD  simvastatin (ZOCOR) 80 MG tablet TAKE ONE TABLET BY MOUTH ONCE DAILY 07/14/16   Boykin Nearing, MD  traZODone (DESYREL) 150 MG tablet  06/12/14   [provider]  Vilazodone HCl (VIIBRYD) 40 MG TABS Take 1 tablet (40 mg total) by mouth daily. 10/01/13   Withrow, Elyse Jarvis, FNP    Family History Family History  Problem Relation Age of Onset  . Hyperlipidemia Mother   . Heart disease Father   . Stroke Other        parent  . Diabetes Other        grandparent /parent  . Obesity Other   . Heart attack Other     Social History Social History   Tobacco Use  . Smoking status: Former Smoker  Substance Use Topics  . Alcohol use: No  . Drug use: No     Allergies   Penicillins   Review of Systems Review of Systems Ten systems are reviewed and are negative for acute change except as noted in the HPI   Physical Exam Updated Vital Signs BP 109/68 (BP Location: Right Arm)   Pulse 89   Temp 97.9 F (36.6 C) (Oral)   Resp 16   SpO2 97%   Physical Exam Constitutional:      General: He is not in acute distress.    Appearance: Normal appearance. He is well-developed. He is not ill-appearing or diaphoretic.  HENT:     Head: Normocephalic and atraumatic.     Right Ear: External ear normal.     Left Ear: External ear normal.     Nose: Nose normal.  Eyes:     General: Vision grossly intact. Gaze aligned appropriately.     Pupils: Pupils are equal, round, and reactive to light.  Neck:     Musculoskeletal: Normal range of motion.     Trachea: Trachea and phonation normal. No tracheal deviation.  Pulmonary:     Effort: Pulmonary effort is normal. No respiratory distress.  Abdominal:     General: There is no distension.     Palpations: Abdomen is soft.     Tenderness: There is no abdominal tenderness. There is no guarding or rebound.  Musculoskeletal: Normal range of motion.     Right shoulder: He exhibits tenderness.     Comments: Cervical Spine:  Appearance normal. No obvious bony deformity. No skin swelling, erythema, heat, fluctuance or break of the skin. No TTP over the cervical spinous processes. No paraspinal tenderness. No step-offs. Patient is able to actively rotate their neck 45 degrees left and right voluntarily without pain and flex and extend the neck without pain.  Right Shoulder: Appearance normal. No obvious bony deformity. No skin swelling, erythema, heat, fluctuance or break of the skin. No clavicular deformity or TTP. TTP over anterior and lateral deltoid.  Passive range of motion  intact up to shoulder level without pain, once arm passive shoulder level patient reports increased pain.  Decreased range of motion with active movements compared to passive secondary to pain.  Right Elbow: Appearance normal. No obvious bony deformity. No skin swelling, erythema, heat, fluctuance or break of the skin. No TTP over joint. Active flexion, extension, supination and pronation full and intact without pain. Strength able and appropriate for age for flexion and extension.  Radial Pulse 2+. Cap refill <2 seconds. SILT for M/U/R distributions. Compartments soft.   Skin:    General: Skin is warm and dry.  Neurological:     Mental Status: He is alert.     GCS: GCS eye subscore is 4. GCS verbal subscore is 5. GCS motor subscore is 6.     Comments: Speech is clear and goal oriented, follows commands Major Cranial nerves without deficit, no facial droop Normal strength in upper extremities including strong and equal grip strength Sensation normal to light and sharp touch Moves extremities without ataxia, coordination intact  Psychiatric:        Behavior: Behavior normal.    ED Treatments / Results  Labs (all labs ordered are listed, but only abnormal results are displayed) Labs Reviewed - No data to display  EKG None  Radiology Dg Shoulder Right  Result Date: 05/01/2019 CLINICAL DATA:  Pain with limited range of motion EXAM: RIGHT  SHOULDER - 2+ VIEW COMPARISON:  None. FINDINGS: Oblique, Y scapular, and axillary images were obtained. No fracture or dislocation. There is no joint space narrowing or erosion. There is a small calcification slightly superior to the lateral humeral head, likely due to calcific tendinosis. Visualized right lung clear. IMPRESSION: Small calcification slightly superior to the lateral right humeral head, likely due to calcific tendinosis. No fracture or dislocation. No appreciable joint space narrowing. Electronically Signed   By: Lowella Grip III M.D.   On: 05/01/2019 09:15    Procedures Procedures (including critical care time)  Medications Ordered in ED Medications - No data to display   Initial Impression / Assessment and Plan / ED Course  I have reviewed the triage vital signs and the nursing notes.  Pertinent labs & imaging results that were available during my care of the patient were reviewed by me and considered in my medical decision making (see chart for details).    Neurovascularly intact the upper extremity without sign of cellulitis, septic arthritis, DVT, compartment syndrome or other emergent pathologies at this time.  No gross ligamentous laxity on examination.  No swelling, deformity or color change.  No bony tenderness. He is increased pain with range of motion above shoulder level.  Pain improved after he was placed in a sling.  No pain with range of motion of the neck, elbow, wrist or hand.  DG right shoulder:  IMPRESSION:  Small calcification slightly superior to the lateral right humeral  head, likely due to calcific tendinosis. No fracture or dislocation.  No appreciable joint space narrowing.  - Discussed imaging with Dr. Laverta Baltimore, will encourage patient to perform range of motion activities to avoid frozen shoulder, follow-up with orthopedist for further evaluation.  Suspect calcific tendinosis/tendinitis as etiology of his symptoms today, patient states  understanding that labral, rotator cuff, ligamentous and tendon injury among other abnormalities may be present in addition to unseen bondy abnormalitiesand that orthopedist follow-up is recommended for reevaluation.  Patient updated on care plan and is agreeable to discharge and outpatient orthopedic follow-up.  He denies history of CKD  or gastric ulcers, he will attempt OTC anti-inflammatories including ibuprofen and Tylenol as directed on packaging to help with his symptoms.  Lidoderm, sling and orthopedist referral provided.  Patient encouraged to call orthopedist office today to schedule follow-up appointment.  At this time there does not appear to be any evidence of an acute emergency medical condition and the patient appears stable for discharge with appropriate outpatient follow up. Diagnosis was discussed with patient who verbalizes understanding of care plan and is agreeable to discharge. I have discussed return precautions with patient who verbalizes understanding of return precautions. Patient encouraged to follow-up with their PCP and ortho. All questions answered.  Note: Portions of this report may have been transcribed using voice recognition software. Every effort was made to ensure accuracy; however, inadvertent computerized transcription errors may still be present. Final Clinical Impressions(s) / ED Diagnoses   Final diagnoses:  Acute pain of right shoulder    ED Discharge Orders         Ordered    lidocaine (LIDODERM) 5 %  Every 24 hours     05/01/19 1016           Gari Crown 05/01/19 1038    Margette Fast, MD 05/02/19 [phone]

## 2019-06-04 ENCOUNTER — Ambulatory Visit: Payer: Self-pay

## 2019-06-04 ENCOUNTER — Emergency Department (HOSPITAL_COMMUNITY)
Admission: EM | Admit: 2019-06-04 | Discharge: 2019-06-04 | Disposition: A | Discharge status: Home / Self Care | Payer: BLUE CROSS/BLUE SHIELD | Attending: Emergency Medicine | Admitting: Emergency Medicine

## 2019-06-04 ENCOUNTER — Encounter (HOSPITAL_COMMUNITY): Payer: Self-pay | Admitting: Emergency Medicine

## 2019-06-04 ENCOUNTER — Emergency Department (HOSPITAL_COMMUNITY): Payer: BLUE CROSS/BLUE SHIELD

## 2019-06-04 ENCOUNTER — Other Ambulatory Visit: Payer: Self-pay

## 2019-06-04 DIAGNOSIS — R0789 Other chest pain: Secondary | ICD-10-CM | POA: Diagnosis not present

## 2019-06-04 DIAGNOSIS — E119 Type 2 diabetes mellitus without complications: Secondary | ICD-10-CM | POA: Diagnosis not present

## 2019-06-04 DIAGNOSIS — J45909 Unspecified asthma, uncomplicated: Secondary | ICD-10-CM | POA: Diagnosis not present

## 2019-06-04 DIAGNOSIS — Z7984 Long term (current) use of oral hypoglycemic drugs: Secondary | ICD-10-CM | POA: Insufficient documentation

## 2019-06-04 DIAGNOSIS — Z87891 Personal history of nicotine dependence: Secondary | ICD-10-CM | POA: Insufficient documentation

## 2019-06-04 DIAGNOSIS — Z79899 Other long term (current) drug therapy: Secondary | ICD-10-CM | POA: Insufficient documentation

## 2019-06-04 LAB — TROPONIN I (HIGH SENSITIVITY)
Troponin I (High Sensitivity): 4 ng/L (ref <18)
Troponin I (High Sensitivity): 4 ng/L (ref <18)

## 2019-06-04 LAB — CBC
HCT: 48.7 % (ref 39.0–52.0)
Hemoglobin: 15.7 g/dL (ref 13.0–17.0)
MCH: 29.6 pg (ref 26.0–34.0)
MCHC: 32.2 g/dL (ref 30.0–36.0)
MCV: 91.7 fL (ref 80.0–100.0)
Platelets: 349 10*3/uL (ref 150–400)
RBC: 5.31 MIL/uL (ref 4.22–5.81)
RDW: 12.2 % (ref 11.5–15.5)
WBC: 8 10*3/uL (ref 4.0–10.5)
nRBC: 0 % (ref 0.0–0.2)

## 2019-06-04 LAB — BASIC METABOLIC PANEL
Anion gap: 10 (ref 5–15)
BUN: 22 mg/dL — ABNORMAL HIGH (ref 6–20)
CO2: 23 mmol/L (ref 22–32)
Calcium: 9.4 mg/dL (ref 8.9–10.3)
Chloride: 104 mmol/L (ref 98–111)
Creatinine, Ser: 1.04 mg/dL (ref 0.61–1.24)
GFR calc Af Amer: >60 mL/min (ref >60)
GFR calc non Af Amer: >60 mL/min (ref >60)
Glucose, Bld: 183 mg/dL — ABNORMAL HIGH (ref 70–99)
Potassium: 3.9 mmol/L (ref 3.5–5.1)
Sodium: 137 mmol/L (ref 135–145)

## 2019-06-04 MED ORDER — ENOXAPARIN SODIUM 120 MG/0.8ML ~~LOC~~ SOLN: 120.0000 mg | Freq: Two times a day (BID) | SUBCUTANEOUS | 0 refills | Status: DC

## 2019-06-04 MED ORDER — ACETAMINOPHEN 325 MG PO TABS
650.0000 mg | ORAL_TABLET | Freq: Four times a day (QID) | ORAL | Status: DC | PRN
  Administered 2019-06-04: 16:00:00 650 mg via ORAL
  Filled 2019-06-04: qty 2

## 2019-06-04 MED ORDER — LACTATED RINGERS IV BOLUS
1000.0000 mL | Freq: Once | INTRAVENOUS | Status: AC
  Administered 2019-06-04: 16:00:00 1000 mL via INTRAVENOUS

## 2019-06-04 MED ORDER — SODIUM CHLORIDE 0.9% FLUSH: 3.0000 mL | Freq: Once | INTRAVENOUS | Status: DC

## 2019-06-04 MED ORDER — ENOXAPARIN SODIUM 120 MG/0.8ML ~~LOC~~ SOLN
120.0000 mg | Freq: Two times a day (BID) | SUBCUTANEOUS | Status: DC
  Administered 2019-06-04: 18:00:00 120 mg via SUBCUTANEOUS
  Filled 2019-06-04 (×2): qty 0.8

## 2019-06-04 NOTE — ED Provider Notes (Signed)
[facility] DEPART[facility] Provider Note   CSN: [id number] Arrival date & time: 06/04/19  1451     History   Chief Complaint Chief Complaint  Patient presents with  . Chest Pain    HPI Bryan Mcmillan is a 48 y.o. male.  The history is provided by the patient.  Chest Pain Pain location:  Substernal area Pain quality: sharp   Pain quality comment:  Pain episodes last about 15-20 minutes at a time Pain radiates to:  Precordial region Onset quality:  Gradual Duration:  2 months Context: at rest and stress   Context: not eating and not lifting   Relieved by:  Rest Ineffective treatments:  Certain positions Associated symptoms: fatigue and headache   Associated symptoms: no abdominal pain, no cough, no fever, no nausea, no palpitations, no shortness of breath and no vomiting   Associated symptoms comment:  Tremors, diaphoresis today Headaches:    Chronicity:  Recurrent Risk factors: high cholesterol, male sex and prior DVT/PE (On lovenox)    Patient reports that he had a normal echo in July in [place] during the hospital admission to evaluate his chest pain.  He states he also had a normal stress test in July.  He denies any smoking, drug use, alcohol use.  Patient states that he has had a history of multiple blood clots, and that he has not had his Lovenox in 3 months.  Past Medical History:  Diagnosis Date  . Anxiety   . Asthma   . Depression   . Diabetes ([place])   . DVT (deep venous thrombosis), hx of recurrent 05/23/2012  . Headache(784.0)    frequent  . History of blood clot in brain, 2012 - followed by [facility] 05/23/2012  . History of blood clots   . History of suicidal tendencies   . Hyperlipemia 08/25/2012  . Hyperlipidemia   . Kidney disease   . Kidney stones   . Migraines   . Obesity   . Pneumonia   . Seizure ([place])   . Seizure disorder - followed by [facility] 05/23/2012  . Sleep apnea   . Tuberculosis     Patient  Active Problem List   Diagnosis Date Noted  . Subcutaneous nodule 07/09/2015  . Chest pain due to psychological stress 11/21/2013  . Major depressive disorder, recurrent episode, severe, without mention of psychotic behavior 09/24/2013  . Speech abnormality 07/11/2013  . Major depression, recurrent ([place]) 03/20/2013  . Brain tumor, glioma ([place]) 11/08/2012  . Hyperlipemia 08/25/2012  . Migraine -followed by [facility] 08/25/2012  . Diabetes type 2, controlled ([place]) 08/25/2012  . DVT (deep venous thrombosis), hx of recurrent 05/23/2012  . Pulmonary embolism ([place]) 05/23/2012  . Seizure disorder - followed by [facility] 05/23/2012    Past Surgical History:  Procedure Laterality Date  . BRAIN SURGERY    . filter for blood clots          Home Medications    Prior to Admission medications   Medication Sig Start Date End Date Taking? Authorizing Provider  atorvastatin (LIPITOR) 40 MG tablet Take 40 mg by mouth at bedtime.   Yes [provider]  buPROPion (WELLBUTRIN XL) 150 MG 24 hr tablet Take 450 mg by mouth daily.   Yes [provider]  gabapentin (NEURONTIN) 300 MG capsule Take 1 capsule (300 mg total) by mouth 3 (three) times daily. 07/09/15  Yes Funches, Josalyn, MD  glimepiride (AMARYL) 4 MG tablet Take 4 mg by mouth  2 (two) times daily.   Yes [provider]  hydrOXYzine (ATARAX/VISTARIL) 50 MG tablet Take 50 mg by mouth every 6 (six) hours as needed for anxiety.   Yes [provider]  INVOKANA 300 MG TABS tablet TAKE 1 TABLET BY MOUTH ONCE DAILY Patient taking differently: Take 300 mg by mouth daily.  04/23/17  Yes Renato Shin, MD  lamoTRIgine (LAMICTAL) 200 MG tablet Take 200 mg by mouth daily.   Yes [provider]  LORazepam (ATIVAN) 0.5 MG tablet Take 0.5 mg by mouth daily as needed for anxiety.   Yes [provider]  pioglitazone (ACTOS) 45 MG tablet TAKE 1 TABLET BY MOUTH ONCE DAILY Patient taking differently:  Take 45 mg by mouth daily.  10/19/16  Yes Renato Shin, MD  prazosin (MINIPRESS) 2 MG capsule Take 2 mg by mouth at bedtime.   Yes [provider]  traZODone (DESYREL) 50 MG tablet Take 100 mg by mouth at bedtime.   Yes [provider]  enoxaparin (LOVENOX) 120 MG/0.8ML injection Inject 0.8 mLs (120 mg total) into the skin every 12 (twelve) hours. 06/04/19 07/04/19  Julianne Rice, MD    Family History Family History  Problem Relation Age of Onset  . Hyperlipidemia Mother   . Heart disease Father   . Stroke Other        parent  . Diabetes Other        grandparent /parent  . Obesity Other   . Heart attack Other     Social History Social History   Tobacco Use  . Smoking status: Former Research scientist (life sciences)  . Smokeless tobacco: Never Used  Substance Use Topics  . Alcohol use: No  . Drug use: No     Allergies   Penicillins   Review of Systems Review of Systems  Constitutional: Positive for fatigue. Negative for fever.  Respiratory: Negative for cough and shortness of breath.   Cardiovascular: Positive for chest pain. Negative for palpitations and leg swelling.  Gastrointestinal: Negative for abdominal pain, nausea and vomiting.  Musculoskeletal: Negative for gait problem.  Skin: Negative for rash and wound.  Neurological: Positive for headaches.  All other systems reviewed and are negative.    Physical Exam Updated Vital Signs BP 121/75 (BP Location: Left Arm)   Pulse 67   Temp 99.7 F (37.6 C) (Oral)   Resp 17   Ht 5\' 11"  (1.803 m)   Wt 127 kg   SpO2 96%   BMI 39.05 kg/m   Physical Exam Vitals signs and nursing note reviewed.  Constitutional:      Appearance: He is well-developed. He is not toxic-appearing.     Comments: Comfortable, well-appearing  HENT:     Head: Normocephalic and atraumatic.  Eyes:     Conjunctiva/sclera: Conjunctivae normal.  Neck:     Musculoskeletal: Neck supple.  Cardiovascular:     Rate and Rhythm: Normal rate and  regular rhythm.     Heart sounds: No murmur.  Pulmonary:     Effort: Pulmonary effort is normal. No respiratory distress.     Breath sounds: Normal breath sounds.     Comments: No chest wall tenderness to palpation Abdominal:     Palpations: Abdomen is soft.     Tenderness: There is no abdominal tenderness.  Musculoskeletal:     Right lower leg: No edema.     Left lower leg: No edema.  Skin:    General: Skin is warm and dry.     Capillary Refill: Capillary  refill takes less than 2 seconds.  Neurological:     General: No focal deficit present.     Mental Status: He is alert and oriented to person, place, and time.     Cranial Nerves: No cranial nerve deficit.     Motor: No weakness.      ED Treatments / Results  Labs (all labs ordered are listed, but only abnormal results are displayed) Labs Reviewed  BASIC METABOLIC PANEL - Abnormal; Notable for the following components:      Result Value   Glucose, Bld 183 (*)    BUN 22 (*)    All other components within normal limits  CBC  TROPONIN I (HIGH SENSITIVITY)  TROPONIN I (HIGH SENSITIVITY)    EKG EKG Interpretation  Date/Time:  Monday June 04 2019 14:58:36 EST Ventricular Rate:  87 PR Interval:  182 QRS Duration: 76 QT Interval:  350 QTC Calculation: 421 R Axis:   131 Text Interpretation: Normal sinus rhythm Low voltage QRS Left posterior fascicular block Cannot rule out Anterior infarct , age undetermined Abnormal ECG No acute changes Confirmed by Varney Biles [phone]) on 06/04/2019 4:15:03 PM   Radiology Dg Chest 2 View  Result Date: 06/04/2019 CLINICAL DATA:  Chest pain and short of breath EXAM: CHEST - 2 VIEW COMPARISON:  05/26/2015 FINDINGS: The heart size and mediastinal contours are within normal limits. Both lungs are clear. The visualized skeletal structures are unremarkable. IMPRESSION: No active cardiopulmonary disease. Electronically Signed   By: Donavan Foil M.D.   On: 06/04/2019 15:16     Procedures Procedures (including critical care time)  Medications Ordered in ED Medications  sodium chloride flush (NS) 0.9 % injection 3 mL (has no administration in time range)  acetaminophen (TYLENOL) tablet 650 mg (650 mg Oral Given 06/04/19 1616)  enoxaparin (LOVENOX) injection 120 mg (120 mg Subcutaneous Given 06/04/19 1815)  lactated ringers bolus 1,000 mL (1,000 mLs Intravenous New Bag/Given 06/04/19 1618)     Initial Impression / Assessment and Plan / ED Course  I have reviewed the triage vital signs and the nursing notes.  Pertinent labs & imaging results that were available during my care of the patient were reviewed by me and considered in my medical decision making (see chart for details).     HEAR Score: 2  Tustin Board is a 48 y.o. male with a past medical history of anxiety, DVT, frequent headaches presents today for chest pain that has been present for 2 months.  Symptoms are not consistent with cardiac etiology, and hear score was 2.  Last stress test was this year and normal.  Labs and imaging ordered to evaluate for ACS, sepsis, electrolyte abnormality, pneumothorax.  Doubt blood clot at this time considering intermittent nature of chest pain.  Pharmacy consulted to help restart Lovenox.  Lovenox given.  Labs do not show any electrolyte abnormality no significant, significant anemia, initial troponin was normal.  Delta troponin was 0.  Patient discharged with a paper prescription for his Lovenox.  All questions answered.  PCP follow-up recommended, symptomatic management with heartburn medications recommended if he were to get chest pain again.  Care of patient discussed with the supervising attending.  Final Clinical Impressions(s) / ED Diagnoses   Final diagnoses:  Atypical chest pain    ED Discharge Orders         Ordered    enoxaparin (LOVENOX) 120 MG/0.8ML injection  Every 12 hours,   Status:  Discontinued     06/04/19 1928  enoxaparin (LOVENOX) 120  MG/0.8ML injection  Every 12 hours     06/04/19 1945           Julianne Rice, MD 06/04/19 2006    Varney Biles, MD 06/04/19 2333

## 2019-06-04 NOTE — ED Notes (Signed)
Pt verbalized understanding of discharge instructions and denies any further questions at this time.   

## 2019-06-04 NOTE — Telephone Encounter (Signed)
  Pt. Reports he has just moved back to this area and has been having chest pain that is getting worse x 4-6 weeks. Has a history of blood clots in his heart, lungs and legs. Has been off his blood thinner x "several months." Had chest pain at the beginning of phone call, but it has lessened now. Instructed to go to ED now. Reason for Disposition . History of prior "blood clot" in leg or lungs (i.e., deep vein thrombosis, pulmonary embolism)  Answer Assessment - Initial Assessment Questions 1. LOCATION: "Where does it hurt?"       Hurts under sternum - middle 2. RADIATION: "Does the pain go anywhere else?" (e.g., into neck, jaw, arms, back)     Ribs 3. ONSET: "When did the chest pain begin?" (Minutes, hours or days)      4-6 weeks ago 4. PATTERN "Does the pain come and go, or has it been constant since it started?"  "Does it get worse with exertion?"      Comes and goes 5. DURATION: "How long does it last" (e.g., seconds, minutes, hours)     Lasts 30-45 minutes 6. SEVERITY: "How bad is the pain?"  (e.g., Scale 1-10; mild, moderate, or severe)    - MILD (1-3): doesn't interfere with normal activities     - MODERATE (4-7): interferes with normal activities or awakens from sleep    - SEVERE (8-10): excruciating pain, unable to do any normal activities        At it's worse 9   The lessens to a 4  7. CARDIAC RISK FACTORS: "Do you have any history of heart problems or risk factors for heart disease?" (e.g., angina, prior heart attack; diabetes, high blood pressure, high cholesterol, smoker, or strong family history of heart disease)     Diabetes, Family history 8. PULMONARY RISK FACTORS: "Do you have any history of lung disease?"  (e.g., blood clots in lung, asthma, emphysema, birth control pills)     Blood clots in heart, lung and legs 9. CAUSE: "What do you think is causing the chest pain?"     Unsure 10. OTHER SYMPTOMS: "Do you have any other symptoms?" (e.g., dizziness, nausea, vomiting,  sweating, fever, difficulty breathing, cough)       Headache 11. PREGNANCY: "Is there any chance you are pregnant?" "When was your last menstrual period?"       n/a  Protocols used: CHEST PAIN-A-AH

## 2019-06-04 NOTE — Discharge Instructions (Addendum)
Normal chest x ray, no signs of heart attack, no signs of infection.  Please try heartburn medications if symptoms return.  Please follow-up with a PCP, number provided within the next week.

## 2019-06-04 NOTE — ED Triage Notes (Signed)
Pt states he started having intermittent cp a few weeks ago. Had echo and EKG and both were negative. Pt also complains of bad headache and trembling in both hands.

## 2019-06-05 MED FILL — ENOXAPARIN 120 MG/0.8 ML SY: 120 | 30 days supply | Qty: 48 | Fill #0

## 2019-07-11 ENCOUNTER — Other Ambulatory Visit: Payer: Self-pay

## 2019-07-12 ENCOUNTER — Ambulatory Visit (INDEPENDENT_AMBULATORY_CARE_PROVIDER_SITE_OTHER): Payer: BLUE CROSS/BLUE SHIELD | Admitting: Internal Medicine

## 2019-07-12 ENCOUNTER — Encounter: Payer: Self-pay | Admitting: Internal Medicine

## 2019-07-12 VITALS — BP 110/64 | HR 100 | Temp 97.9°F | Wt 277.4 lb

## 2019-07-12 DIAGNOSIS — I2782 Chronic pulmonary embolism: Secondary | ICD-10-CM

## 2019-07-12 DIAGNOSIS — I825Y3 Chronic embolism and thrombosis of unspecified deep veins of proximal lower extremity, bilateral: Secondary | ICD-10-CM | POA: Diagnosis not present

## 2019-07-12 DIAGNOSIS — F331 Major depressive disorder, recurrent, moderate: Secondary | ICD-10-CM

## 2019-07-12 DIAGNOSIS — E119 Type 2 diabetes mellitus without complications: Secondary | ICD-10-CM

## 2019-07-12 DIAGNOSIS — E118 Type 2 diabetes mellitus with unspecified complications: Secondary | ICD-10-CM

## 2019-07-12 DIAGNOSIS — G4733 Obstructive sleep apnea (adult) (pediatric): Secondary | ICD-10-CM

## 2019-07-12 DIAGNOSIS — E785 Hyperlipidemia, unspecified: Secondary | ICD-10-CM

## 2019-07-12 DIAGNOSIS — G4700 Insomnia, unspecified: Secondary | ICD-10-CM

## 2019-07-12 LAB — POCT GLYCOSYLATED HEMOGLOBIN (HGB A1C): Hemoglobin A1C: 6.4 % — AB (ref 4.0–5.6)

## 2019-07-12 NOTE — Patient Instructions (Signed)
-  Nice seeing you today!!  Referrals as discussed have been placed today.  -Schedule follow up in 3 months for you physical. Please come in fasting.

## 2019-07-12 NOTE — Progress Notes (Signed)
New Patient Office Visit     This visit occurred during the SARS-CoV-2 public health emergency.  Safety protocols were in place, including screening questions prior to the visit, additional usage of staff PPE, and extensive cleaning of exam room while observing appropriate contact time as indicated for disinfecting solutions.    CC/Reason for Visit: Establish care, discuss chronic medical conditions Previous PCP: In [place] Last Visit: Earlier this year  HPI: Bryan Mcmillan is a 48 y.o. male who is coming in today for the above mentioned reasons. Past Medical History is extensive and significant for:  1. Well-controlled type 2 diabetes for which he is requesting endocrinology referral today.  He has seen Dr. Loanne Drilling in the past and if possible would like to see him.  2.  Hyperlipidemia on atorvastatin  3.  Insomnia for which he takes as needed trazodone approximately twice a week  4.  Major depression, he was seeing a psychiatrist and a psychologist in [place] and would like those referrals locally as well.  5.  Obstructive sleep apnea, he is not currently wearing his CPAP as directed in [place] and is trying to get it shipped here.  6.  From the best that I can understand per his report, it appears he had a vein thrombosis in his brain in 2012.  Was subsequently placed on Coumadin and despite having therapeutic doses of Coumadin had repeated episodes of PE and DVT.  He states they tried "every blood thinner and the only thing that works is Lovenox" he has been taking Lovenox twice a day for years.  He had a hematologist in [place] and states that "they could not find a reason for his clotting".  We will have him sign a release request for records today.  He is currently going through a divorce and moved to [place] to be closer to his parents is looking for a job, he has a childhood allergy to penicillin, unknown reaction.  In addition to his brain surgery he has had 3 toes  amputated on his right foot.   Past Medical/Surgical History: Past Medical History:  Diagnosis Date  . Anxiety   . Asthma   . Depression   . Diabetes ([place])   . DVT (deep venous thrombosis), hx of recurrent 05/23/2012  . Headache(784.0)    frequent  . History of blood clot in brain, 2012 - followed by [facility] 05/23/2012  . History of blood clots   . History of suicidal tendencies   . Hyperlipemia 08/25/2012  . Hyperlipidemia   . Kidney disease   . Kidney stones   . Migraines   . Obesity   . Pneumonia   . Seizure ([place])   . Seizure disorder - followed by [facility] 05/23/2012  . Sleep apnea   . Tuberculosis     Past Surgical History:  Procedure Laterality Date  . BRAIN SURGERY    . filter for blood clots      Social History:  reports that he has quit smoking. He has never used smokeless tobacco. He reports that he does not drink alcohol or use drugs.  Allergies: Allergies  Allergen Reactions  . Penicillins Other (See Comments)    Did it involve swelling of the face/tongue/throat, SOB, or low BP? N/A Did it involve sudden or severe rash/hives, skin peeling, or any reaction on the inside of your mouth or nose? N/A Did you need to seek medical attention at a hospital or doctor's office? N/A When did it last  happen?Child  If all above answers are "NO", may proceed with cephalosporin use.    Family History:  Family History  Problem Relation Age of Onset  . Hyperlipidemia Mother   . Heart disease Father   . Stroke Other        parent  . Diabetes Other        grandparent /parent  . Obesity Other   . Heart attack Other      Current Outpatient Medications:  .  atorvastatin (LIPITOR) 40 MG tablet, Take 40 mg by mouth at bedtime., Disp: , Rfl:  .  buPROPion (WELLBUTRIN XL) 150 MG 24 hr tablet, Take 450 mg by mouth daily., Disp: , Rfl:  .  gabapentin (NEURONTIN) 300 MG capsule, Take 1 capsule (300 mg total) by mouth 3 (three) times daily., Disp: 90  capsule, Rfl: 5 .  glimepiride (AMARYL) 4 MG tablet, Take 4 mg by mouth 2 (two) times daily., Disp: , Rfl:  .  hydrOXYzine (ATARAX/VISTARIL) 50 MG tablet, Take 50 mg by mouth every 6 (six) hours as needed for anxiety., Disp: , Rfl:  .  INVOKANA 300 MG TABS tablet, TAKE 1 TABLET BY MOUTH ONCE DAILY (Patient taking differently: Take 300 mg by mouth daily. ), Disp: 30 tablet, Rfl: 2 .  lamoTRIgine (LAMICTAL) 200 MG tablet, Take 200 mg by mouth daily., Disp: , Rfl:  .  LORazepam (ATIVAN) 0.5 MG tablet, Take 0.5 mg by mouth daily as needed for anxiety., Disp: , Rfl:  .  pioglitazone (ACTOS) 45 MG tablet, TAKE 1 TABLET BY MOUTH ONCE DAILY (Patient taking differently: Take 45 mg by mouth daily. ), Disp: 30 tablet, Rfl: 0 .  prazosin (MINIPRESS) 2 MG capsule, Take 2 mg by mouth at bedtime., Disp: , Rfl:  .  traZODone (DESYREL) 50 MG tablet, Take 100 mg by mouth at bedtime., Disp: , Rfl:  .  enoxaparin (LOVENOX) 120 MG/0.8ML injection, Inject 0.8 mLs (120 mg total) into the skin every 12 (twelve) hours., Disp: 48 mL, Rfl: 0  Review of Systems:  Constitutional: Denies fever, chills, diaphoresis, appetite change and fatigue.  HEENT: Denies photophobia, eye pain, redness, hearing loss, ear pain, congestion, sore throat, rhinorrhea, sneezing, mouth sores, trouble swallowing, neck pain, neck stiffness and tinnitus.   Respiratory: Denies SOB, DOE, cough, chest tightness,  and wheezing.   Cardiovascular: Denies chest pain, palpitations and leg swelling.  Gastrointestinal: Denies nausea, vomiting, abdominal pain, diarrhea, constipation, blood in stool and abdominal distention.  Genitourinary: Denies dysuria, urgency, frequency, hematuria, flank pain and difficulty urinating.  Endocrine: Denies: hot or cold intolerance, sweats, changes in hair or nails, polyuria, polydipsia. Musculoskeletal: Denies myalgias, back pain, joint swelling, arthralgias and gait problem.  Skin: Denies pallor, rash and wound.    Neurological: Denies dizziness, seizures, syncope, weakness, light-headedness, numbness and headaches.  Hematological: Denies adenopathy. Easy bruising, personal or family bleeding history  Psychiatric/Behavioral: Denies suicidal ideation, mood changes, confusion, nervousness, sleep disturbance and agitation    Physical Exam: Vitals:   07/12/19 1457  BP: 110/64  Pulse: 100  Temp: 97.9 F (36.6 C)  TempSrc: Temporal  SpO2: 96%  Weight: 277 lb 6.4 oz (125.8 kg)   Body mass index is 38.69 kg/m.  Constitutional: NAD, calm, comfortable, obese Eyes: PERRL, lids and conjunctivae normal, wears corrective lenses ENMT: Mucous membranes are moist.  Respiratory: clear to auscultation bilaterally, no wheezing, no crackles. Normal respiratory effort. No accessory muscle use.  Cardiovascular: Regular rate and rhythm, no murmurs / rubs / gallops. No extremity  edema. 2+ pedal pulses.  Abdomen: no tenderness, no masses palpated. No hepatosplenomegaly. Bowel sounds positive.  Musculoskeletal: no clubbing / cyanosis. No joint deformity upper and lower extremities. Good ROM, no contractures. Normal muscle tone.  Skin: no rashes, lesions, ulcers. No induration Neurologic: Grossly intact and nonfocal Psychiatric: Normal judgment and insight. Alert and oriented x 3. Normal mood.    Impression and Plan:  Type 2 diabetes mellitus without complication, without long-term current use of insulin (HCC) -Well-controlled, A1c in office today is 6.4, will refer to endocrinology per his request.  Chronic deep vein thrombosis (DVT) of proximal vein of both lower extremities (HCC)  Other chronic pulmonary embolism without acute cor pulmonale (HCC)  -Continue twice daily Lovenox injections, is requesting referral to local hematology.  Hyperlipidemia, unspecified hyperlipidemia type -Continue atorvastatin, check lipids when he returns for CPE.  Moderate episode of recurrent major depressive disorder ([place])  -  Plan: Ambulatory referral to Psychiatry, Ambulatory referral to Psychology -Mood appears stable today  Insomnia, unspecified type -He only uses as needed trazodone.  OSA (obstructive sleep apnea)  - Plan: Ambulatory referral to Pulmonology per request  Morbid obesity ([place]) -Discussed healthy lifestyle, including increased physical activity and better food choices to promote weight loss.  He will schedule return visit for CPE.    Patient Instructions  -Nice seeing you today!!  Referrals as discussed have been placed today.  -Schedule follow up in 3 months for you physical. Please come in fasting.       Lelon Frohlich, MD [facility]

## 2019-07-13 ENCOUNTER — Telehealth: Payer: Self-pay | Admitting: Hematology and Oncology

## 2019-07-13 NOTE — Telephone Encounter (Signed)
Received a new hem referral from Dr. Jerilee Hoh for chronic dvt. I cld and scheduled Bryan Mcmillan to see Dr. Lorenso Courier on 12/22 at [place]. Pt aware to arrive 15 minutes early.

## 2019-07-20 ENCOUNTER — Ambulatory Visit: Payer: BLUE CROSS/BLUE SHIELD | Admitting: Endocrinology

## 2019-07-24 ENCOUNTER — Inpatient Hospital Stay: Payer: BLUE CROSS/BLUE SHIELD | Attending: Hematology and Oncology | Admitting: Hematology and Oncology

## 2019-07-24 ENCOUNTER — Inpatient Hospital Stay: Payer: BLUE CROSS/BLUE SHIELD

## 2019-07-24 ENCOUNTER — Other Ambulatory Visit: Payer: Self-pay

## 2019-07-24 VITALS — BP 132/72 | HR 82 | Temp 97.8°F | Resp 18 | Ht 71.0 in | Wt 276.2 lb

## 2019-07-24 DIAGNOSIS — F418 Other specified anxiety disorders: Secondary | ICD-10-CM | POA: Diagnosis not present

## 2019-07-24 DIAGNOSIS — E669 Obesity, unspecified: Secondary | ICD-10-CM | POA: Diagnosis not present

## 2019-07-24 DIAGNOSIS — J45909 Unspecified asthma, uncomplicated: Secondary | ICD-10-CM | POA: Insufficient documentation

## 2019-07-24 DIAGNOSIS — Z7901 Long term (current) use of anticoagulants: Secondary | ICD-10-CM | POA: Insufficient documentation

## 2019-07-24 DIAGNOSIS — Z87891 Personal history of nicotine dependence: Secondary | ICD-10-CM | POA: Insufficient documentation

## 2019-07-24 DIAGNOSIS — G4730 Sleep apnea, unspecified: Secondary | ICD-10-CM | POA: Insufficient documentation

## 2019-07-24 DIAGNOSIS — Z79899 Other long term (current) drug therapy: Secondary | ICD-10-CM | POA: Diagnosis not present

## 2019-07-24 DIAGNOSIS — Z87442 Personal history of urinary calculi: Secondary | ICD-10-CM | POA: Diagnosis not present

## 2019-07-24 DIAGNOSIS — Z7984 Long term (current) use of oral hypoglycemic drugs: Secondary | ICD-10-CM | POA: Diagnosis not present

## 2019-07-24 DIAGNOSIS — E785 Hyperlipidemia, unspecified: Secondary | ICD-10-CM | POA: Diagnosis not present

## 2019-07-24 DIAGNOSIS — Z86718 Personal history of other venous thrombosis and embolism: Secondary | ICD-10-CM | POA: Diagnosis not present

## 2019-07-24 DIAGNOSIS — G40909 Epilepsy, unspecified, not intractable, without status epilepticus: Secondary | ICD-10-CM | POA: Diagnosis not present

## 2019-07-24 DIAGNOSIS — E119 Type 2 diabetes mellitus without complications: Secondary | ICD-10-CM | POA: Diagnosis not present

## 2019-07-24 MED ORDER — ENOXAPARIN SODIUM 120 MG/0.8ML ~~LOC~~ SOLN: 120.0000 mg | Freq: Two times a day (BID) | SUBCUTANEOUS | 1 refills | Status: DC
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
## 2019-07-25 ENCOUNTER — Telehealth: Payer: Self-pay | Admitting: Hematology and Oncology

## 2019-07-25 NOTE — Telephone Encounter (Signed)
Scheduled per los. Called and spoke with patient. Confirmed appt 

## 2019-07-30 ENCOUNTER — Encounter (HOSPITAL_COMMUNITY): Payer: Self-pay | Admitting: Emergency Medicine

## 2019-07-30 ENCOUNTER — Other Ambulatory Visit: Payer: Self-pay

## 2019-07-30 ENCOUNTER — Ambulatory Visit (INDEPENDENT_AMBULATORY_CARE_PROVIDER_SITE_OTHER): Payer: BLUE CROSS/BLUE SHIELD

## 2019-07-30 ENCOUNTER — Ambulatory Visit (HOSPITAL_COMMUNITY)
Admission: EM | Admit: 2019-07-30 | Discharge: 2019-07-30 | Disposition: A | Discharge status: Home / Self Care | Payer: BLUE CROSS/BLUE SHIELD | Attending: Family Medicine | Admitting: Family Medicine

## 2019-07-30 DIAGNOSIS — S82891A Other fracture of right lower leg, initial encounter for closed fracture: Secondary | ICD-10-CM

## 2019-07-30 MED ORDER — ACETAMINOPHEN 325 MG PO TABS
650.0000 mg | ORAL_TABLET | Freq: Once | ORAL | Status: AC
  Administered 2019-07-30: 15:00:00 650 mg via ORAL

## 2019-07-30 MED ORDER — ACETAMINOPHEN 325 MG PO TABS
ORAL_TABLET | ORAL | Status: AC
  Filled 2019-07-30: qty 2

## 2019-07-30 NOTE — ED Provider Notes (Signed)
[facility]    CSN: [id number] Arrival date & time: 07/30/19  1352      History   Chief Complaint Chief Complaint  Patient presents with  . Ankle Pain    HPI Bryan Mcmillan is a 48 y.o. male.   Patient reports to urgent care today for Right ankle injury. Patient reports rolling his right ankle as he stepped off a curb today. He was unable to bear weight immediately after or currently in clinic. He reports immediate swelling but no discoloration. He reports the pain is severe. He was given tylenol here, with some response.   He has never had an injury to this ankle but has had his big toe and toes amputated due to diabetes.     Past Medical History:  Diagnosis Date  . Anxiety   . Asthma   . Depression   . Diabetes ([place])   . DVT (deep venous thrombosis), hx of recurrent 05/23/2012  . Headache(784.0)    frequent  . History of blood clot in brain, 2012 - followed by [facility] 05/23/2012  . History of blood clots   . History of suicidal tendencies   . Hyperlipemia 08/25/2012  . Hyperlipidemia   . Kidney disease   . Kidney stones   . Migraines   . Obesity   . Pneumonia   . Seizure ([place])   . Seizure disorder - followed by [facility] 05/23/2012  . Sleep apnea   . Tuberculosis     Patient Active Problem List   Diagnosis Date Noted  . Subcutaneous nodule 07/09/2015  . Chest pain due to psychological stress 11/21/2013  . Major depressive disorder, recurrent episode, severe, without mention of psychotic behavior 09/24/2013  . Speech abnormality 07/11/2013  . Major depression, recurrent ([place]) 03/20/2013  . Brain tumor, glioma ([place]) 11/08/2012  . Hyperlipemia 08/25/2012  . Migraine -followed by [facility] 08/25/2012  . Diabetes type 2, controlled ([place]) 08/25/2012  . DVT (deep venous thrombosis), hx of recurrent 05/23/2012  . Pulmonary embolism ([place]) 05/23/2012  . Seizure disorder - followed by [facility] 05/23/2012    Past  Surgical History:  Procedure Laterality Date  . BRAIN SURGERY    . filter for blood clots         Home Medications    Prior to Admission medications   Medication Sig Start Date End Date Taking? Authorizing Provider  atorvastatin (LIPITOR) 40 MG tablet Take 40 mg by mouth at bedtime.   Yes [provider]  buPROPion (WELLBUTRIN XL) 150 MG 24 hr tablet Take 450 mg by mouth daily.   Yes [provider]  enoxaparin (LOVENOX) 120 MG/0.8ML injection Inject 0.8 mLs (120 mg total) into the skin every 12 (twelve) hours. 07/24/19  Yes Orson Slick, MD  gabapentin (NEURONTIN) 300 MG capsule Take 1 capsule (300 mg total) by mouth 3 (three) times daily. 07/09/15  Yes Funches, Josalyn, MD  glimepiride (AMARYL) 4 MG tablet Take 4 mg by mouth 2 (two) times daily.   Yes [provider]  hydrOXYzine (ATARAX/VISTARIL) 50 MG tablet Take 50 mg by mouth every 6 (six) hours as needed for anxiety.   Yes [provider]  INVOKANA 300 MG TABS tablet TAKE 1 TABLET BY MOUTH ONCE DAILY Patient taking differently: Take 300 mg by mouth daily.  04/23/17  Yes Renato Shin, MD  lamoTRIgine (LAMICTAL) 200 MG tablet Take 200 mg by mouth daily.   Yes [provider]  LORazepam (ATIVAN) 0.5 MG  tablet Take 0.5 mg by mouth daily as needed for anxiety.   Yes [provider]  pioglitazone (ACTOS) 45 MG tablet TAKE 1 TABLET BY MOUTH ONCE DAILY Patient taking differently: Take 45 mg by mouth daily.  10/19/16  Yes Renato Shin, MD  prazosin (MINIPRESS) 2 MG capsule Take 2 mg by mouth at bedtime.   Yes [provider]  traZODone (DESYREL) 50 MG tablet Take 100 mg by mouth at bedtime.   Yes [provider]    Family History Family History  Problem Relation Age of Onset  . Hyperlipidemia Mother   . Heart disease Father   . Stroke Other        parent  . Diabetes Other        grandparent /parent  . Obesity Other   . Heart attack Other     Social  History Social History   Tobacco Use  . Smoking status: Former Research scientist (life sciences)  . Smokeless tobacco: Never Used  Substance Use Topics  . Alcohol use: No  . Drug use: No     Allergies   Hydrocodone-acetaminophen and Penicillins   Review of Systems Review of Systems  Constitutional: Positive for activity change.  Musculoskeletal: Positive for arthralgias, gait problem and joint swelling.  Skin: Negative for color change, pallor, rash and wound.  Neurological: Negative for numbness.     Physical Exam Triage Vital Signs ED Triage Vitals  Enc Vitals Group     BP 07/30/19 1519 123/70     Pulse Rate 07/30/19 1519 84     Resp 07/30/19 1519 16     Temp 07/30/19 1519 98.4 F (36.9 C)     Temp Source 07/30/19 1519 Oral     SpO2 07/30/19 1519 99 %     Weight --      Height --      Head Circumference --      Peak Flow --      Pain Score 07/30/19 1514 10     Pain Loc --      Pain Edu? --      Excl. in [place]? --    No data found.  Updated Vital Signs BP 123/70   Pulse 84   Temp 98.4 F (36.9 C) (Oral)   Resp 16   SpO2 99%   Visual Acuity Right Eye Distance:   Left Eye Distance:   Bilateral Distance:    Right Eye Near:   Left Eye Near:    Bilateral Near:     Physical Exam Constitutional:      General: He is not in acute distress.    Appearance: Normal appearance.  HENT:     Head: Normocephalic and atraumatic.  Musculoskeletal:     Right lower leg: No edema.     Left lower leg: No edema.     Right ankle: Swelling present. Tenderness present over the lateral malleolus, medial malleolus, ATF ligament and base of 5th metatarsal. Decreased range of motion. Normal pulse.     Left ankle: Normal.     Comments: Right great toe amputation- well healed.  Skin:    Capillary Refill: Capillary refill takes less than 2 seconds.  Neurological:     General: No focal deficit present.     Mental Status: He is alert and oriented to person, place, and time.      UC Treatments /  Results  Labs (all labs ordered are listed, but only abnormal results are displayed) Labs Reviewed - No data to  display  EKG   Radiology DG Ankle Complete Right  Result Date: 07/30/2019 CLINICAL DATA:  Ankle injury EXAM: RIGHT ANKLE - COMPLETE 3+ VIEW COMPARISON:  04/25/2014 FINDINGS: Normal ankle joint. Normal alignment. Joint effusion. Anterior and lateral and medial soft tissue swelling Chronic fracture lateral talus as noted previously. New fracture tip of fibula may be acute. Small soft tissue calcification distal to the medial malleolus likely due to chronic injury. IMPRESSION: Diffuse soft tissue swelling with ankle joint effusion. Probable acute fracture tip of the fibula. Chronic fracture lateral talus.  Chronic injury medial ligaments. Electronically Signed   By: Franchot Gallo M.D.   On: 07/30/2019 17:38    Procedures Procedures (including critical care time)  Medications Ordered in UC Medications  acetaminophen (TYLENOL) tablet 650 mg (650 mg Oral Given 07/30/19 1528)    Initial Impression / Assessment and Plan / UC Course  I have reviewed the triage vital signs and the nursing notes.  Pertinent labs & imaging results that were available during my care of the patient were reviewed by me and considered in my medical decision making (see chart for details).     #Ankle Fracture - xray read with both chronic and acute fractures. MOI lends towards acute fracture of distal fibula. Cam walker placed and crutches given. Instructed to schedule with orthopedics outpatient due to other findings that were briefly discussed. Patient informed me he was unaware of these. Patient feels aleeve and tylenol will be fine for his pain. Follow up with PCP if unable to obtain ortho appointment.    Final Clinical Impressions(s) / UC Diagnoses   Final diagnoses:  Closed fracture of right ankle, initial encounter     Discharge Instructions     Utilize the Cam Walker throughout the day.  You may remove this to bath and ice the ankle  Use the crutches until you are evaluated by orthopedics.  Take aleeve 2 times a day, 1 in the morning and 1 at night.   You may also use tylenol 2 x 325mg  tablets every 6 hours for pain.   Call the orthopedic office I have attached in this packet tomorrow to be evaluated. If you are having issues with being seen, follow up with your primary Care provider.        ED Prescriptions    None     PDMP not reviewed this encounter.   Purnell Shoemaker, PA-C 07/30/19 1816

## 2019-07-30 NOTE — Discharge Instructions (Signed)
Utilize the [employer] throughout the day. You may remove this to bath and ice the ankle  Use the crutches until you are evaluated by orthopedics.  Take aleeve 2 times a day, 1 in the morning and 1 at night.   You may also use tylenol 2 x 325mg  tablets every 6 hours for pain.   Call the orthopedic office I have attached in this packet tomorrow to be evaluated. If you are having issues with being seen, follow up with your primary Care provider.

## 2019-07-30 NOTE — ED Triage Notes (Signed)
PT rolled ankle and fell as he was crossing the street. He cannot bear weight. Injury occurred today.

## 2019-08-10 ENCOUNTER — Ambulatory Visit (INDEPENDENT_AMBULATORY_CARE_PROVIDER_SITE_OTHER): Payer: BLUE CROSS/BLUE SHIELD | Admitting: Endocrinology

## 2019-08-10 ENCOUNTER — Encounter: Payer: Self-pay | Admitting: Endocrinology

## 2019-08-10 ENCOUNTER — Other Ambulatory Visit: Payer: Self-pay

## 2019-08-10 DIAGNOSIS — E1159 Type 2 diabetes mellitus with other circulatory complications: Secondary | ICD-10-CM

## 2019-08-10 MED ORDER — GLIMEPIRIDE 4 MG PO TABS: 4.0000 mg | ORAL_TABLET | Freq: Every day | ORAL | 3 refills | Status: DC

## 2019-08-10 MED ORDER — CANAGLIFLOZIN 300 MG PO TABS: 300.0000 mg | ORAL_TABLET | Freq: Every day | ORAL | 3 refills | Status: DC

## 2019-08-10 NOTE — Progress Notes (Addendum)
Subjective:    Patient ID: Bryan Mcmillan, male    DOB: [birth year], 49 y.o.   MRN: [id number]  HPI telehealth visit today via doxy video visit.  Alternatives to telehealth are presented to this patient, and the patient agrees to the telehealth visit. Pt is advised of the cost of the visit, and agrees to this, also.   Patient is at home, and I am at the office.   Persons attending the telehealth visit: the patient and I Pt is ref by Dr Doreatha Lew, for diabetes mellitus: I last saw pt in 2017 DM type: 2 Dx'ed: 0000000 Complications: none Therapy: 3 oral meds DKA: never Severe hypoglycemia: never.   Pancreatitis: never Other: he took insulin 2012-2015.  Interval history: no cbg record, but states cbg's are well-controlled.  pt states he feels well in general, and that he takes meds as rx'ed.  He has moved back to [place].  He has hypoglycemia if a meal is delayed.   Past Medical History:  Diagnosis Date  . Anxiety   . Asthma   . Depression   . Diabetes ([place])   . DVT (deep venous thrombosis), hx of recurrent 05/23/2012  . Headache(784.0)    frequent  . History of blood clot in brain, 2012 - followed by [facility] 05/23/2012  . History of blood clots   . History of suicidal tendencies   . Hyperlipemia 08/25/2012  . Hyperlipidemia   . Kidney disease   . Kidney stones   . Migraines   . Obesity   . Pneumonia   . Seizure ([place])   . Seizure disorder - followed by [facility] 05/23/2012  . Sleep apnea   . Tuberculosis     Past Surgical History:  Procedure Laterality Date  . BRAIN SURGERY    . filter for blood clots      Social History   Socioeconomic History  . Marital status: Divorced    Spouse name: Not on file  . Number of children: 1  . Years of education: BA  . Highest education level: Not on file  Occupational History  . Occupation: CUST SERV    Employer: [employer]  Tobacco Use  . Smoking status: Former Research scientist (life sciences)  . Smokeless tobacco: Never Used    Substance and Sexual Activity  . Alcohol use: No  . Drug use: No  . Sexual activity: Never  Other Topics Concern  . Not on file  Social History Narrative   Work or School: Back of [employer] Situation: lives with sister and mother      Spiritual Beliefs:      Lifestyle: trying to walk and working on diet      Caffeine Use: does consume         Social Determinants of Health   Financial Resource Strain:   . Difficulty of Paying Living Expenses: Not on file  Food Insecurity:   . Worried About Charity fundraiser in the Last Year: Not on file  . Ran Out of Food in the Last Year: Not on file  Transportation Needs:   . Lack of Transportation (Medical): Not on file  . Lack of Transportation (Non-Medical): Not on file  Physical Activity:   . Days of Exercise per Week: Not on file  . Minutes of Exercise per Session: Not on file  Stress:   . Feeling of Stress : Not on file  Social Connections:   . Frequency of Communication with  Friends and Family: Not on file  . Frequency of Social Gatherings with Friends and Family: Not on file  . Attends Religious Services: Not on file  . Active Member of Clubs or Organizations: Not on file  . Attends Archivist Meetings: Not on file  . Marital Status: Not on file  Intimate Partner Violence:   . Fear of Current or Ex-Partner: Not on file  . Emotionally Abused: Not on file  . Physically Abused: Not on file  . Sexually Abused: Not on file    Current Outpatient Medications on File Prior to Visit  Medication Sig Dispense Refill  . atorvastatin (LIPITOR) 40 MG tablet Take 40 mg by mouth at bedtime.    Marland Kitchen buPROPion (WELLBUTRIN XL) 150 MG 24 hr tablet Take 450 mg by mouth daily.    Marland Kitchen enoxaparin (LOVENOX) 120 MG/0.8ML injection Inject 0.8 mLs (120 mg total) into the skin every 12 (twelve) hours. 48 mL 1  . gabapentin (NEURONTIN) 300 MG capsule Take 1 capsule (300 mg total) by mouth 3 (three) times daily. 90 capsule 5  .  hydrOXYzine (ATARAX/VISTARIL) 50 MG tablet Take 50 mg by mouth every 6 (six) hours as needed for anxiety.    . lamoTRIgine (LAMICTAL) 200 MG tablet Take 200 mg by mouth daily.    Marland Kitchen LORazepam (ATIVAN) 0.5 MG tablet Take 0.5 mg by mouth daily as needed for anxiety.    . pioglitazone (ACTOS) 45 MG tablet TAKE 1 TABLET BY MOUTH ONCE DAILY (Patient taking differently: Take 45 mg by mouth daily. ) 30 tablet 0  . prazosin (MINIPRESS) 2 MG capsule Take 2 mg by mouth at bedtime.    . traZODone (DESYREL) 50 MG tablet Take 100 mg by mouth at bedtime.     No current facility-administered medications on file prior to visit.    Allergies  Allergen Reactions  . Hydrocodone-Acetaminophen Shortness Of Breath  . Penicillins Other (See Comments)    Did it involve swelling of the face/tongue/throat, SOB, or low BP? N/A Did it involve sudden or severe rash/hives, skin peeling, or any reaction on the inside of your mouth or nose? N/A Did you need to seek medical attention at a hospital or doctor's office? N/A When did it last happen?Child  If all above answers are "NO", may proceed with cephalosporin use.    Family History  Problem Relation Age of Onset  . Hyperlipidemia Mother   . Heart disease Father   . Stroke Other        parent  . Diabetes Other        grandparent /parent  . Obesity Other   . Heart attack Other     There were no vitals taken for this visit.   Review of Systems He has lost 10 lbs since last ov here.  Denies ankle swelling.      Objective:   Physical Exam VITAL SIGNS:  See vs page GENERAL: no distress Ext: no ankle edema.    Lab Results  Component Value Date   HGBA1C 6.4 (A) 07/12/2019   I have reviewed outside records, and summarized: Pt was noted to have DM, and referred here.  depression, dyslipidemia, and insomnia were also addressed.       Assessment & Plan:  Type 2 DM: overcontrolled.  Obesity, improved but persistent.  Hypoglycemia, new to me.    Patient Instructions  Please consider having weight loss surgery.  It is good for your health.  Here is some information about it.  If you decide to consider further, please call the phone number in the papers, and register for a free informational meeting Please reduce the amaryl to 4 mg each morning only.  Please continue the same other diabetes medications.  check your blood sugar once a day.  vary the time of day when you check, between before the 3 meals, and at bedtime.  also check if you have symptoms of your blood sugar being too high or too low.  please keep a record of the readings and bring it to your next appointment here (or you can bring the meter itself).  You can write it on any piece of paper.  please call us sooner if your blood sugar goes below 70, or if you have a lot of readings over 200. Please come back for a follow-up appointment in 2 months.

## 2019-08-10 NOTE — Patient Instructions (Addendum)
Please consider having weight loss surgery.  It is good for your health.  Here is some information about it.  If you decide to consider further, please call the phone number in the papers, and register for a free informational meeting Please reduce the amaryl to 4 mg each morning only.  Please continue the same other diabetes medications.  check your blood sugar once a day.  vary the time of day when you check, between before the 3 meals, and at bedtime.  also check if you have symptoms of your blood sugar being too high or too low.  please keep a record of the readings and bring it to your next appointment here (or you can bring the meter itself).  You can write it on any piece of paper.  please call us sooner if your blood sugar goes below 70, or if you have a lot of readings over 200. Please come back for a follow-up appointment in 2 months.

## 2019-08-14 ENCOUNTER — Other Ambulatory Visit: Payer: Self-pay

## 2019-08-14 DIAGNOSIS — E1159 Type 2 diabetes mellitus with other circulatory complications: Secondary | ICD-10-CM

## 2019-08-14 MED ORDER — CANAGLIFLOZIN 300 MG PO TABS: 300.0000 mg | ORAL_TABLET | Freq: Every day | ORAL | 3 refills | Status: DC
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
## 2019-08-22 ENCOUNTER — Inpatient Hospital Stay: Payer: BLUE CROSS/BLUE SHIELD

## 2019-08-22 ENCOUNTER — Encounter: Payer: Self-pay | Admitting: Hematology and Oncology

## 2019-08-22 ENCOUNTER — Inpatient Hospital Stay: Payer: BLUE CROSS/BLUE SHIELD | Attending: Hematology and Oncology | Admitting: Hematology and Oncology

## 2019-08-22 ENCOUNTER — Other Ambulatory Visit: Payer: Self-pay

## 2019-08-22 VITALS — BP 112/79 | HR 87 | Temp 98.0°F | Resp 17 | Ht 71.0 in | Wt 278.4 lb

## 2019-08-22 DIAGNOSIS — S82891D Other fracture of right lower leg, subsequent encounter for closed fracture with routine healing: Secondary | ICD-10-CM | POA: Insufficient documentation

## 2019-08-22 DIAGNOSIS — Z8673 Personal history of transient ischemic attack (TIA), and cerebral infarction without residual deficits: Secondary | ICD-10-CM | POA: Insufficient documentation

## 2019-08-22 DIAGNOSIS — E785 Hyperlipidemia, unspecified: Secondary | ICD-10-CM | POA: Insufficient documentation

## 2019-08-22 DIAGNOSIS — E119 Type 2 diabetes mellitus without complications: Secondary | ICD-10-CM | POA: Insufficient documentation

## 2019-08-22 DIAGNOSIS — D689 Coagulation defect, unspecified: Secondary | ICD-10-CM

## 2019-08-22 DIAGNOSIS — Z87442 Personal history of urinary calculi: Secondary | ICD-10-CM | POA: Diagnosis not present

## 2019-08-22 DIAGNOSIS — F418 Other specified anxiety disorders: Secondary | ICD-10-CM | POA: Insufficient documentation

## 2019-08-22 DIAGNOSIS — E669 Obesity, unspecified: Secondary | ICD-10-CM | POA: Insufficient documentation

## 2019-08-22 DIAGNOSIS — Z79899 Other long term (current) drug therapy: Secondary | ICD-10-CM | POA: Insufficient documentation

## 2019-08-22 DIAGNOSIS — Z7901 Long term (current) use of anticoagulants: Secondary | ICD-10-CM | POA: Diagnosis not present

## 2019-08-22 DIAGNOSIS — G4730 Sleep apnea, unspecified: Secondary | ICD-10-CM | POA: Diagnosis not present

## 2019-08-22 DIAGNOSIS — Z86718 Personal history of other venous thrombosis and embolism: Secondary | ICD-10-CM | POA: Diagnosis present

## 2019-08-22 DIAGNOSIS — Z87891 Personal history of nicotine dependence: Secondary | ICD-10-CM | POA: Insufficient documentation

## 2019-08-22 LAB — CBC WITH DIFFERENTIAL (CANCER CENTER ONLY)
Abs Immature Granulocytes: 0.03 10*3/uL (ref 0.00–0.07)
Basophils Absolute: 0 10*3/uL (ref 0.0–0.1)
Basophils Relative: 1 %
Eosinophils Absolute: 0.1 10*3/uL (ref 0.0–0.5)
Eosinophils Relative: 1 %
HCT: 44.4 % (ref 39.0–52.0)
Hemoglobin: 14.6 g/dL (ref 13.0–17.0)
Immature Granulocytes: 1 %
Lymphocytes Relative: 45 %
Lymphs Abs: 2.6 10*3/uL (ref 0.7–4.0)
MCH: 29.7 pg (ref 26.0–34.0)
MCHC: 32.9 g/dL (ref 30.0–36.0)
MCV: 90.4 fL (ref 80.0–100.0)
Monocytes Absolute: 0.4 10*3/uL (ref 0.1–1.0)
Monocytes Relative: 7 %
Neutro Abs: 2.6 10*3/uL (ref 1.7–7.7)
Neutrophils Relative %: 45 %
Platelet Count: 288 10*3/uL (ref 150–400)
RBC: 4.91 MIL/uL (ref 4.22–5.81)
RDW: 12.9 % (ref 11.5–15.5)
WBC Count: 5.8 10*3/uL (ref 4.0–10.5)
nRBC: 0 % (ref 0.0–0.2)

## 2019-08-22 LAB — CMP (CANCER CENTER ONLY)
ALT: 59 U/L — ABNORMAL HIGH (ref 0–44)
AST: 35 U/L (ref 15–41)
Albumin: 4 g/dL (ref 3.5–5.0)
Alkaline Phosphatase: 61 U/L (ref 38–126)
Anion gap: 8 (ref 5–15)
BUN: 17 mg/dL (ref 6–20)
CO2: 27 mmol/L (ref 22–32)
Calcium: 9.4 mg/dL (ref 8.9–10.3)
Chloride: 105 mmol/L (ref 98–111)
Creatinine: 1.14 mg/dL (ref 0.61–1.24)
GFR, Est AFR Am: >60 mL/min (ref >60)
GFR, Estimated: >60 mL/min (ref >60)
Glucose, Bld: 143 mg/dL — ABNORMAL HIGH (ref 70–99)
Potassium: 4.2 mmol/L (ref 3.5–5.1)
Sodium: 140 mmol/L (ref 135–145)
Total Bilirubin: 0.4 mg/dL (ref 0.3–1.2)
Total Protein: 7.2 g/dL (ref 6.5–8.1)

## 2019-08-23 ENCOUNTER — Telehealth: Payer: Self-pay | Admitting: *Deleted

## 2019-08-23 ENCOUNTER — Telehealth: Payer: Self-pay | Admitting: Hematology and Oncology

## 2019-08-23 NOTE — Telephone Encounter (Signed)
TCT patient regarding lab work results.  Spoke with patient and informed him that his lab are normal.. Dr. Lorenso Courier states patient can continue his BID lovenox. Once we receive his medical records from his physician in [place] for repeat labs if needed. Pt voiced understanding

## 2019-08-23 NOTE — Telephone Encounter (Signed)
-----   Message from Orson Slick, MD sent at 08/23/2019  9:09 AM EST ----- Please let Mr. Trembath know that his labs show normal Hemoglobin, Kidney function, and liver function. He can keep taking the lovenox as prescribed. We will have him come back for a lab visit once we have his outside records and determine if any further bloodwork is needed.   Colan Neptune  ----- Message ----- From: Buel Ream, Lab In [place] Sent: 08/22/2019  12:41 PM EST To: Orson Slick, MD

## 2019-08-23 NOTE — Telephone Encounter (Signed)
Scheduled per los. Called and left msg. Mailed printout  °

## 2019-08-24 ENCOUNTER — Other Ambulatory Visit: Payer: Self-pay | Admitting: *Deleted

## 2019-08-24 MED ORDER — ATORVASTATIN CALCIUM 40 MG PO TABS: 40.0000 mg | ORAL_TABLET | Freq: Every day | ORAL | 0 refills | Status: DC

## 2019-09-07 ENCOUNTER — Other Ambulatory Visit: Payer: Self-pay | Admitting: Internal Medicine

## 2019-09-17 ENCOUNTER — Encounter: Payer: Self-pay | Admitting: Internal Medicine

## 2019-09-29 ENCOUNTER — Other Ambulatory Visit: Payer: Self-pay

## 2019-09-29 ENCOUNTER — Ambulatory Visit (HOSPITAL_COMMUNITY): Payer: Federal, State, Local not specified - Other | Admitting: Psychiatry

## 2019-10-01 ENCOUNTER — Ambulatory Visit: Payer: BLUE CROSS/BLUE SHIELD | Admitting: Endocrinology

## 2019-10-05 ENCOUNTER — Other Ambulatory Visit: Payer: Self-pay | Admitting: Internal Medicine

## 2019-10-09 ENCOUNTER — Encounter: Payer: Self-pay | Admitting: Hematology and Oncology

## 2019-10-10 ENCOUNTER — Encounter: Payer: BLUE CROSS/BLUE SHIELD | Admitting: Internal Medicine

## 2019-10-12 ENCOUNTER — Telehealth: Payer: Self-pay | Admitting: Internal Medicine

## 2019-10-12 NOTE — Telephone Encounter (Signed)
Left message on machine for patient to return our call 

## 2019-10-12 NOTE — Telephone Encounter (Signed)
Pt would like referral to a psychiatrist and would like to know if Dr. Jerilee Hoh, would refill Lorazepam 0.5 mg tablet, Bupropion(Wellbutrin XL) 150 mg, and Prazosin(Minipress) 2 mg capsule.Pt uses [facility] on [address]).

## 2019-10-17 ENCOUNTER — Telehealth: Payer: Self-pay | Admitting: Internal Medicine

## 2019-10-17 NOTE — Telephone Encounter (Signed)
Patient needs to r/s appt for 10/19/19.  Provider will not be available morning of pt's appt.

## 2019-10-19 ENCOUNTER — Encounter: Payer: BLUE CROSS/BLUE SHIELD | Admitting: Internal Medicine

## 2019-10-19 NOTE — Telephone Encounter (Signed)
Patient cancelled his appointment for 10/19/19 due to a doctor's meeting.

## 2019-10-24 ENCOUNTER — Telehealth: Payer: Self-pay

## 2019-10-24 NOTE — Telephone Encounter (Signed)
Received notification from Bryan Mcmillan re: need for surgical clearance. Per Dr. Loanne Drilling, unable to complete without an appt. Called pt to schedule appt for surgical clearance. LVM requesting returned call. Routing to front desk per protocol.

## 2019-10-28 ENCOUNTER — Encounter: Payer: Self-pay | Admitting: Hematology and Oncology

## 2019-10-29 ENCOUNTER — Other Ambulatory Visit: Payer: Self-pay | Admitting: *Deleted

## 2019-10-29 MED ORDER — ENOXAPARIN SODIUM 120 MG/0.8ML ~~LOC~~ SOLN: 120.0000 mg | Freq: Two times a day (BID) | SUBCUTANEOUS | 1 refills | Status: DC

## 2019-11-04 ENCOUNTER — Other Ambulatory Visit: Payer: Self-pay | Admitting: Internal Medicine

## 2019-11-06 ENCOUNTER — Encounter: Payer: Self-pay | Admitting: Endocrinology

## 2019-11-06 ENCOUNTER — Ambulatory Visit: Payer: BLUE CROSS/BLUE SHIELD | Admitting: Endocrinology

## 2019-11-06 ENCOUNTER — Other Ambulatory Visit: Payer: Self-pay

## 2019-11-06 VITALS — BP 114/70 | HR 85 | Ht 71.0 in | Wt 266.0 lb

## 2019-11-06 DIAGNOSIS — E1159 Type 2 diabetes mellitus with other circulatory complications: Secondary | ICD-10-CM | POA: Diagnosis not present

## 2019-11-06 LAB — POCT GLYCOSYLATED HEMOGLOBIN (HGB A1C): Hemoglobin A1C: 6.3 % — AB (ref 4.0–5.6)

## 2019-11-06 NOTE — Patient Instructions (Addendum)
Please stop taking the pioglitazone.  Please continue the same other diabetes medications.    check your blood sugar once a day.  vary the time of day when you check, between before the 3 meals, and at bedtime.  also check if you have symptoms of your blood sugar being too high or too low.  please keep a record of the readings and bring it to your next appointment here (or you can bring the meter itself).  You can write it on any piece of paper.  please call us sooner if your blood sugar goes below 70, or if you have a lot of readings over 200. Please come back for a follow-up appointment in 2 months.

## 2019-11-06 NOTE — Progress Notes (Signed)
Subjective:    Patient ID: Bryan Mcmillan, male    DOB: 1971-04-14, 49 y.o.   MRN: [id number]  HPI Pt returns for f/u of DM:  DM type: 2 Dx'ed: 0000000 Complications: none Therapy: 3 oral meds DKA: never Severe hypoglycemia: never.   Pancreatitis: never Other: he took insulin 2012-2015.  Interval history: pt states cbg's are well-controlled.  pt states he feels well in general, and that he takes meds as rx'ed.   Past Medical History:  Diagnosis Date  . Anxiety   . Asthma   . Depression   . Diabetes ([place])   . DVT (deep venous thrombosis), hx of recurrent 05/23/2012  . Headache(784.0)    frequent  . History of blood clot in brain, 2012 - followed by [facility] 05/23/2012  . History of blood clots   . History of suicidal tendencies   . Hyperlipemia 08/25/2012  . Hyperlipidemia   . Kidney disease   . Kidney stones   . Migraines   . Obesity   . Pneumonia   . Seizure ([place])   . Seizure disorder - followed by [facility] 05/23/2012  . Sleep apnea   . Tuberculosis     Past Surgical History:  Procedure Laterality Date  . BRAIN SURGERY    . filter for blood clots      Social History   Socioeconomic History  . Marital status: Divorced    Spouse name: Not on file  . Number of children: 1  . Years of education: BA  . Highest education level: Not on file  Occupational History  . Occupation: CUST SERV    Employer: [employer]  Tobacco Use  . Smoking status: Former Research scientist (life sciences)  . Smokeless tobacco: Never Used  Substance and Sexual Activity  . Alcohol use: No  . Drug use: No  . Sexual activity: Never  Other Topics Concern  . Not on file  Social History Narrative   Work or School: Back of [employer] Situation: lives with sister and mother      Spiritual Beliefs:      Lifestyle: trying to walk and working on diet      Caffeine Use: does consume         Social Determinants of Health   Financial Resource Strain:   . Difficulty of  Paying Living Expenses:   Food Insecurity:   . Worried About Charity fundraiser in the Last Year:   . Arboriculturist in the Last Year:   Transportation Needs:   . Film/video editor (Medical):   Marland Kitchen Lack of Transportation (Non-Medical):   Physical Activity:   . Days of Exercise per Week:   . Minutes of Exercise per Session:   Stress:   . Feeling of Stress :   Social Connections:   . Frequency of Communication with Friends and Family:   . Frequency of Social Gatherings with Friends and Family:   . Attends Religious Services:   . Active Member of Clubs or Organizations:   . Attends Archivist Meetings:   Marland Kitchen Marital Status:   Intimate Partner Violence:   . Fear of Current or Ex-Partner:   . Emotionally Abused:   Marland Kitchen Physically Abused:   . Sexually Abused:     Current Outpatient Medications on File Prior to Visit  Medication Sig Dispense Refill  . atorvastatin (LIPITOR) 40 MG tablet TAKE 1 TABLET BY MOUTH EVERYDAY AT BEDTIME 30 tablet 0  .  buPROPion (WELLBUTRIN XL) 150 MG 24 hr tablet Take 450 mg by mouth daily.    . canagliflozin (INVOKANA) 300 MG TABS tablet Take 1 tablet (300 mg total) by mouth daily. 90 tablet 3  . enoxaparin (LOVENOX) 120 MG/0.8ML injection Inject 0.8 mLs (120 mg total) into the skin every 12 (twelve) hours. 48 mL 1  . gabapentin (NEURONTIN) 300 MG capsule Take 1 capsule (300 mg total) by mouth 3 (three) times daily. 90 capsule 5  . glimepiride (AMARYL) 4 MG tablet Take 1 tablet (4 mg total) by mouth daily with breakfast. 90 tablet 3  . hydrOXYzine (ATARAX/VISTARIL) 50 MG tablet Take 50 mg by mouth every 6 (six) hours as needed for anxiety.    . lamoTRIgine (LAMICTAL) 200 MG tablet Take 200 mg by mouth daily.    Marland Kitchen LORazepam (ATIVAN) 0.5 MG tablet Take 0.5 mg by mouth daily as needed for anxiety.    . prazosin (MINIPRESS) 2 MG capsule Take 2 mg by mouth at bedtime.    . traZODone (DESYREL) 50 MG tablet Take 100 mg by mouth at bedtime.     No current  facility-administered medications on file prior to visit.    Allergies  Allergen Reactions  . Hydrocodone-Acetaminophen Shortness Of Breath  . Penicillins Other (See Comments)    Did it involve swelling of the face/tongue/throat, SOB, or low BP? N/A Did it involve sudden or severe rash/hives, skin peeling, or any reaction on the inside of your mouth or nose? N/A Did you need to seek medical attention at a hospital or doctor's office? N/A When did it last happen?Child  If all above answers are "NO", may proceed with cephalosporin use.    Family History  Problem Relation Age of Onset  . Hyperlipidemia Mother   . Heart disease Father   . Stroke Other        parent  . Diabetes Other        grandparent /parent  . Obesity Other   . Heart attack Other     BP 114/70   Pulse 85   Ht 5\' 11"  (1.803 m)   Wt 266 lb (120.7 kg)   SpO2 96%   BMI 37.10 kg/m   Review of Systems He denies hypoglycemia.      Objective:   Physical Exam VITAL SIGNS:  See vs page GENERAL: no distress Pulses: dorsalis pedis intact bilat.   MSK: no deformity of the feet, except right toes 1-3 toes are absent.   CV: trace bilat leg edema Skin:  no ulcer on the feet.  normal color and temp on the feet.   Neuro: sensation is intact to touch on the feet   Lab Results  Component Value Date   HGBA1C 6.3 (A) 11/06/2019       Assessment & Plan:  Type 2 DM: well-controlled.  Edema, new, prob due to pioglitazone.   Patient Instructions  Please stop taking the pioglitazone.  Please continue the same other diabetes medications.    check your blood sugar once a day.  vary the time of day when you check, between before the 3 meals, and at bedtime.  also check if you have symptoms of your blood sugar being too high or too low.  please keep a record of the readings and bring it to your next appointment here (or you can bring the meter itself).  You can write it on any piece of paper.  please call us sooner if  your blood sugar goes below  70, or if you have a lot of readings over 200. Please come back for a follow-up appointment in 2 months.

## 2019-11-18 ENCOUNTER — Other Ambulatory Visit: Payer: Self-pay | Admitting: Internal Medicine

## 2019-12-12 NOTE — Telephone Encounter (Signed)
done

## 2019-12-12 NOTE — Telephone Encounter (Signed)
Received request for surgical clearance from [facility]. Form has been placed on Dr. Cordelia Pen desk for him to review and address. Awaiting his response

## 2019-12-12 NOTE — Telephone Encounter (Signed)
Patient scheduled a follow up and called to follow up on the clearance for his surgery.

## 2019-12-12 NOTE — Telephone Encounter (Signed)
FAXED [place]: Raliegh Ip  Document: Surgical Clearance Other records requested: None requested  All above requested information has been faxed successfully to the Company listed above. Documents and fax confirmation have been placed in the faxed file for future reference.

## 2019-12-17 ENCOUNTER — Encounter: Payer: Self-pay | Admitting: Internal Medicine

## 2019-12-19 MED ORDER — BUPROPION HCL ER (XL) 150 MG PO TB24: 450.0000 mg | ORAL_TABLET | Freq: Every day | ORAL | 0 refills | Status: DC

## 2019-12-19 MED ORDER — LAMOTRIGINE 200 MG PO TABS: 200.0000 mg | ORAL_TABLET | Freq: Every day | ORAL | 0 refills | Status: DC

## 2020-01-01 ENCOUNTER — Other Ambulatory Visit: Payer: Self-pay

## 2020-01-03 ENCOUNTER — Ambulatory Visit: Payer: 59 | Admitting: Endocrinology

## 2020-01-03 ENCOUNTER — Other Ambulatory Visit: Payer: Self-pay

## 2020-01-03 ENCOUNTER — Encounter: Payer: Self-pay | Admitting: Endocrinology

## 2020-01-03 VITALS — BP 120/72 | HR 74 | Ht 71.0 in | Wt 272.2 lb

## 2020-01-03 DIAGNOSIS — E1159 Type 2 diabetes mellitus with other circulatory complications: Secondary | ICD-10-CM

## 2020-01-03 LAB — POCT GLYCOSYLATED HEMOGLOBIN (HGB A1C): Hemoglobin A1C: 7.6 % — AB (ref 4.0–5.6)

## 2020-01-03 MED ORDER — METFORMIN HCL ER 500 MG PO TB24: 500.0000 mg | ORAL_TABLET | Freq: Every day | ORAL | 3 refills | Status: DC

## 2020-01-03 NOTE — Progress Notes (Signed)
Subjective:    Patient ID: Bryan Mcmillan, male    DOB: 17-May-1971, 49 y.o.   MRN: [id number]  HPI Pt returns for f/u of DM:  DM type: 2 Dx'ed: 0000000 Complications: none Therapy: 2 oral meds DKA: never Severe hypoglycemia: never.   Pancreatitis: never Other: he took insulin 2012-2015; he did not tolerate pioglitazone (edema).   Interval history: pt states cbg's are well-controlled.  pt states he feels well in general, and that he takes meds as rx'ed.   Past Medical History:  Diagnosis Date  . Anxiety   . Asthma   . Depression   . Diabetes ([place])   . DVT (deep venous thrombosis), hx of recurrent 05/23/2012  . Headache(784.0)    frequent  . History of blood clot in brain, 2012 - followed by [facility] 05/23/2012  . History of blood clots   . History of suicidal tendencies   . Hyperlipemia 08/25/2012  . Hyperlipidemia   . Kidney disease   . Kidney stones   . Migraines   . Obesity   . Pneumonia   . Seizure ([place])   . Seizure disorder - followed by [facility] 05/23/2012  . Sleep apnea   . Tuberculosis     Past Surgical History:  Procedure Laterality Date  . BRAIN SURGERY    . filter for blood clots      Social History   Socioeconomic History  . Marital status: Divorced    Spouse name: Not on file  . Number of children: 1  . Years of education: BA  . Highest education level: Not on file  Occupational History  . Occupation: CUST SERV    Employer: [employer]  Tobacco Use  . Smoking status: Former Research scientist (life sciences)  . Smokeless tobacco: Never Used  Substance and Sexual Activity  . Alcohol use: No  . Drug use: No  . Sexual activity: Never  Other Topics Concern  . Not on file  Social History Narrative   Work or School: Back of [employer] Situation: lives with sister and mother      Spiritual Beliefs:      Lifestyle: trying to walk and working on diet      Caffeine Use: does consume         Social Determinants of Health    Financial Resource Strain:   . Difficulty of Paying Living Expenses:   Food Insecurity:   . Worried About Charity fundraiser in the Last Year:   . Arboriculturist in the Last Year:   Transportation Needs:   . Film/video editor (Medical):   Marland Kitchen Lack of Transportation (Non-Medical):   Physical Activity:   . Days of Exercise per Week:   . Minutes of Exercise per Session:   Stress:   . Feeling of Stress :   Social Connections:   . Frequency of Communication with Friends and Family:   . Frequency of Social Gatherings with Friends and Family:   . Attends Religious Services:   . Active Member of Clubs or Organizations:   . Attends Archivist Meetings:   Marland Kitchen Marital Status:   Intimate Partner Violence:   . Fear of Current or Ex-Partner:   . Emotionally Abused:   Marland Kitchen Physically Abused:   . Sexually Abused:     Current Outpatient Medications on File Prior to Visit  Medication Sig Dispense Refill  . atorvastatin (LIPITOR) 40 MG tablet TAKE 1 TABLET BY  MOUTH EVERYDAY AT BEDTIME 90 tablet 0  . buPROPion (WELLBUTRIN XL) 150 MG 24 hr tablet Take 3 tablets (450 mg total) by mouth daily. 30 tablet 0  . canagliflozin (INVOKANA) 300 MG TABS tablet Take 1 tablet (300 mg total) by mouth daily. 90 tablet 3  . enoxaparin (LOVENOX) 120 MG/0.8ML injection Inject 0.8 mLs (120 mg total) into the skin every 12 (twelve) hours. 48 mL 1  . gabapentin (NEURONTIN) 300 MG capsule Take 1 capsule (300 mg total) by mouth 3 (three) times daily. 90 capsule 5  . glimepiride (AMARYL) 4 MG tablet Take 1 tablet (4 mg total) by mouth daily with breakfast. 90 tablet 3  . hydrOXYzine (ATARAX/VISTARIL) 50 MG tablet Take 50 mg by mouth every 6 (six) hours as needed for anxiety.    . lamoTRIgine (LAMICTAL) 200 MG tablet Take 1 tablet (200 mg total) by mouth daily. 30 tablet 0  . LORazepam (ATIVAN) 0.5 MG tablet Take 0.5 mg by mouth daily as needed for anxiety.    . prazosin (MINIPRESS) 2 MG capsule Take 2 mg by  mouth at bedtime.    . traZODone (DESYREL) 50 MG tablet Take 100 mg by mouth at bedtime.     No current facility-administered medications on file prior to visit.    Allergies  Allergen Reactions  . Hydrocodone-Acetaminophen Shortness Of Breath  . Penicillins Other (See Comments)    Did it involve swelling of the face/tongue/throat, SOB, or low BP? N/A Did it involve sudden or severe rash/hives, skin peeling, or any reaction on the inside of your mouth or nose? N/A Did you need to seek medical attention at a hospital or doctor's office? N/A When did it last happen?Child  If all above answers are "NO", may proceed with cephalosporin use.    Family History  Problem Relation Age of Onset  . Hyperlipidemia Mother   . Heart disease Father   . Stroke Other        parent  . Diabetes Other        grandparent /parent  . Obesity Other   . Heart attack Other     BP 120/72 (BP Location: Left Arm, Patient Position: Sitting, Cuff Size: Large)   Pulse 74   Ht 5\' 11"  (1.803 m)   Wt 272 lb 3.2 oz (123.5 kg)   SpO2 95%   BMI 37.96 kg/m    Review of Systems He denies hypoglycemia.  No weight loss.     Objective:   Physical Exam VITAL SIGNS:  See vs page GENERAL: no distress Pulses: dorsalis pedis intact bilat.   MSK: no deformity of the feet, except right toes 1-3 toes are absent.  CV: trace bilat leg edema Skin:  no ulcer on the feet.  normal color and temp on the feet.  Neuro: sensation is intact to touch on the feet.    Lab Results  Component Value Date   CREATININE 1.14 08/22/2019   BUN 17 08/22/2019   NA 140 08/22/2019   K 4.2 08/22/2019   CL 105 08/22/2019   CO2 27 08/22/2019   Lab Results  Component Value Date   HGBA1C 7.6 (A) 01/03/2020      Assessment & Plan:  Type 2 DM: worse Edema: persistent off pioglitazone.   Patient Instructions  Please stop taking the pioglitazone.  I have sent a prescription to your pharmacy, to add metformin.   Please continue  the same other diabetes medications.   Our goals are A1c below 7 and  off the glimepiride.   check your blood sugar once a day.  vary the time of day when you check, between before the 3 meals, and at bedtime.  also check if you have symptoms of your blood sugar being too high or too low.  please keep a record of the readings and bring it to your next appointment here (or you can bring the meter itself).  You can write it on any piece of paper.  please call us sooner if your blood sugar goes below 70, or if you have a lot of readings over 200. Please come back for a follow-up appointment in 3 months.

## 2020-01-03 NOTE — Patient Instructions (Addendum)
Please stop taking the pioglitazone.  I have sent a prescription to your pharmacy, to add metformin.   Please continue the same other diabetes medications.   Our goals are A1c below 7 and off the glimepiride.   check your blood sugar once a day.  vary the time of day when you check, between before the 3 meals, and at bedtime.  also check if you have symptoms of your blood sugar being too high or too low.  please keep a record of the readings and bring it to your next appointment here (or you can bring the meter itself).  You can write it on any piece of paper.  please call us sooner if your blood sugar goes below 70, or if you have a lot of readings over 200. Please come back for a follow-up appointment in 3 months.

## 2020-01-09 ENCOUNTER — Ambulatory Visit (INDEPENDENT_AMBULATORY_CARE_PROVIDER_SITE_OTHER): Payer: 59 | Admitting: Pulmonary Disease

## 2020-01-09 ENCOUNTER — Encounter: Payer: Self-pay | Admitting: Pulmonary Disease

## 2020-01-09 ENCOUNTER — Other Ambulatory Visit: Payer: Self-pay

## 2020-01-09 VITALS — BP 116/80 | HR 71 | Temp 98.4°F | Ht 71.0 in | Wt 274.8 lb

## 2020-01-09 DIAGNOSIS — Z87898 Personal history of other specified conditions: Secondary | ICD-10-CM

## 2020-01-09 DIAGNOSIS — R0683 Snoring: Secondary | ICD-10-CM | POA: Diagnosis not present

## 2020-01-09 DIAGNOSIS — G4733 Obstructive sleep apnea (adult) (pediatric): Secondary | ICD-10-CM

## 2020-01-09 NOTE — Addendum Note (Signed)
Addended by: Valerie Salts on: 01/09/2020 11:14 AM   Modules accepted: Orders

## 2020-01-09 NOTE — Patient Instructions (Signed)
History of obstructive sleep apnea  We will schedule you for a home sleep study Continue your weight loss efforts  Call with significant concerns  Follow-up in 2 to 3 months  Sleep Apnea Sleep apnea is a condition in which breathing pauses or becomes shallow during sleep. Episodes of sleep apnea usually last 10 seconds or longer, and they may occur as many as 20 times an hour. Sleep apnea disrupts your sleep and keeps your body from getting the rest that it needs. This condition can increase your risk of certain health problems, including:  Heart attack.  Stroke.  Obesity.  Diabetes.  Heart failure.  Irregular heartbeat. What are the causes? There are three kinds of sleep apnea:  Obstructive sleep apnea. This kind is caused by a blocked or collapsed airway.  Central sleep apnea. This kind happens when the part of the brain that controls breathing does not send the correct signals to the muscles that control breathing.  Mixed sleep apnea. This is a combination of obstructive and central sleep apnea. The most common cause of this condition is a collapsed or blocked airway. An airway can collapse or become blocked if:  Your throat muscles are abnormally relaxed.  Your tongue and tonsils are larger than normal.  You are overweight.  Your airway is smaller than normal. What increases the risk? You are more likely to develop this condition if you:  Are overweight.  Smoke.  Have a smaller than normal airway.  Are elderly.  Are male.  Drink alcohol.  Take sedatives or tranquilizers.  Have a family history of sleep apnea. What are the signs or symptoms? Symptoms of this condition include:  Trouble staying asleep.  Daytime sleepiness and tiredness.  Irritability.  Loud snoring.  Morning headaches.  Trouble concentrating.  Forgetfulness.  Decreased interest in sex.  Unexplained sleepiness.  Mood swings.  Personality changes.  Feelings of  depression.  Waking up often during the night to urinate.  Dry mouth.  Sore throat. How is this diagnosed? This condition may be diagnosed with:  A medical history.  A physical exam.  A series of tests that are done while you are sleeping (sleep study). These tests are usually done in a sleep lab, but they may also be done at home. How is this treated? Treatment for this condition aims to restore normal breathing and to ease symptoms during sleep. It may involve managing health issues that can affect breathing, such as high blood pressure or obesity. Treatment may include:  Sleeping on your side.  Using a decongestant if you have nasal congestion.  Avoiding the use of depressants, including alcohol, sedatives, and narcotics.  Losing weight if you are overweight.  Making changes to your diet.  Quitting smoking.  Using a device to open your airway while you sleep, such as: ? An oral appliance. This is a custom-made mouthpiece that shifts your lower jaw forward. ? A continuous positive airway pressure (CPAP) device. This device blows air through a mask when you breathe out (exhale). ? A nasal expiratory positive airway pressure (EPAP) device. This device has valves that you put into each nostril. ? A bi-level positive airway pressure (BPAP) device. This device blows air through a mask when you breathe in (inhale) and breathe out (exhale).  Having surgery if other treatments do not work. During surgery, excess tissue is removed to create a wider airway. It is important to get treatment for sleep apnea. Without treatment, this condition can lead to:  High  blood pressure.  Coronary artery disease.  In men, an inability to achieve or maintain an erection (impotence).  Reduced thinking abilities. Follow these instructions at home: Lifestyle  Make any lifestyle changes that your health care provider recommends.  Eat a healthy, well-balanced diet.  Take steps to lose weight  if you are overweight.  Avoid using depressants, including alcohol, sedatives, and narcotics.  Do not use any products that contain nicotine or tobacco, such as cigarettes, e-cigarettes, and chewing tobacco. If you need help quitting, ask your health care provider. General instructions  Take over-the-counter and prescription medicines only as told by your health care provider.  If you were given a device to open your airway while you sleep, use it only as told by your health care provider.  If you are having surgery, make sure to tell your health care provider you have sleep apnea. You may need to bring your device with you.  Keep all follow-up visits as told by your health care provider. This is important. Contact a health care provider if:  The device that you received to open your airway during sleep is uncomfortable or does not seem to be working.  Your symptoms do not improve.  Your symptoms get worse. Get help right away if:  You develop: ? Chest pain. ? Shortness of breath. ? Discomfort in your back, arms, or stomach.  You have: ? Trouble speaking. ? Weakness on one side of your body. ? Drooping in your face. These symptoms may represent a serious problem that is an emergency. Do not wait to see if the symptoms will go away. Get medical help right away. Call your local emergency services (911 in the [place] Do not drive yourself to the hospital. Summary  Sleep apnea is a condition in which breathing pauses or becomes shallow during sleep.  The most common cause is a collapsed or blocked airway.  The goal of treatment is to restore normal breathing and to ease symptoms during sleep. This information is not intended to replace advice given to you by your health care provider. Make sure you discuss any questions you have with your health care provider. Document Revised: 01/03/2019 Document Reviewed: 03/14/2018 Elsevier Patient Education  [place].

## 2020-01-09 NOTE — Progress Notes (Signed)
Bryan Mcmillan    [id number]    [birth year]  Primary Care Physician:Hernandez Everardo Beals, MD  Referring Physician: [name], Rayford Halsted, MD [address] [place],  [place] [address]  Chief complaint:   Patient with a history of obstructive sleep apnea diagnosed in 2012   HPI:  Has had 2 machines in the past  Unsure how severe his sleep apnea was Using CPAP did not help in the past  Usually goes to bed between 830 and 10 AM Takes him about 5 to 10 minutes to fall asleep 1-2 awakenings  Final wake up time between 5 and 5:15 PM  Usually does the third shift  Has managed to lose about 20 to 30 pounds Quarter pack a day smoker  History of diabetes, neck and back surgery in the past Admits to dryness of his mouth in the mornings, occasional headaches in the mornings Memory is average  He does have a history of depression, anxiety  No other significant health issues recently  Outpatient Encounter Medications as of 01/09/2020  Medication Sig  . atorvastatin (LIPITOR) 40 MG tablet TAKE 1 TABLET BY MOUTH EVERYDAY AT BEDTIME  . buPROPion (WELLBUTRIN XL) 150 MG 24 hr tablet Take 3 tablets (450 mg total) by mouth daily.  . canagliflozin (INVOKANA) 300 MG TABS tablet Take 1 tablet (300 mg total) by mouth daily.  Marland Kitchen enoxaparin (LOVENOX) 120 MG/0.8ML injection Inject 0.8 mLs (120 mg total) into the skin every 12 (twelve) hours.  . gabapentin (NEURONTIN) 300 MG capsule Take 1 capsule (300 mg total) by mouth 3 (three) times daily.  Marland Kitchen glimepiride (AMARYL) 4 MG tablet Take 1 tablet (4 mg total) by mouth daily with breakfast.  . hydrOXYzine (ATARAX/VISTARIL) 50 MG tablet Take 50 mg by mouth every 6 (six) hours as needed for anxiety.  . lamoTRIgine (LAMICTAL) 200 MG tablet Take 1 tablet (200 mg total) by mouth daily.  Marland Kitchen LORazepam (ATIVAN) 0.5 MG tablet Take 0.5 mg by mouth daily as needed for anxiety.  . metFORMIN (GLUCOPHAGE-XR) 500 MG 24 hr tablet  Take 1 tablet (500 mg total) by mouth daily with breakfast.  . prazosin (MINIPRESS) 2 MG capsule Take 2 mg by mouth at bedtime.  . traZODone (DESYREL) 50 MG tablet Take 100 mg by mouth at bedtime.   No facility-administered encounter medications on file as of 01/09/2020.    Allergies as of 01/09/2020 - Review Complete 01/09/2020  Allergen Reaction Noted  . Hydrocodone-acetaminophen Shortness Of Breath 07/24/2019  . Penicillins Other (See Comments) 05/23/2012    Past Medical History:  Diagnosis Date  . Anxiety   . Asthma   . Depression   . Diabetes ([place])   . DVT (deep venous thrombosis), hx of recurrent 05/23/2012  . Headache(784.0)    frequent  . History of blood clot in brain, 2012 - followed by [facility] 05/23/2012  . History of blood clots   . History of suicidal tendencies   . Hyperlipemia 08/25/2012  . Hyperlipidemia   . Kidney disease   . Kidney stones   . Migraines   . Obesity   . Pneumonia   . Seizure ([place])   . Seizure disorder - followed by [facility] 05/23/2012  . Sleep apnea   . Tuberculosis     Past Surgical History:  Procedure Laterality Date  . BRAIN SURGERY    . filter for blood clots      Family History  Problem Relation  Age of Onset  . Hyperlipidemia Mother   . Heart disease Father   . Stroke Other        parent  . Diabetes Other        grandparent /parent  . Obesity Other   . Heart attack Other     Social History   Socioeconomic History  . Marital status: Divorced    Spouse name: Not on file  . Number of children: 1  . Years of education: BA  . Highest education level: Not on file  Occupational History  . Occupation: CUST SERV    Employer: [employer]  Tobacco Use  . Smoking status: Former Research scientist (life sciences)  . Smokeless tobacco: Never Used  Substance and Sexual Activity  . Alcohol use: No  . Drug use: No  . Sexual activity: Never  Other Topics Concern  . Not on file  Social History Narrative   Work or School: Back of  [employer] Situation: lives with sister and mother      Spiritual Beliefs:      Lifestyle: trying to walk and working on diet      Caffeine Use: does consume         Social Determinants of Health   Financial Resource Strain:   . Difficulty of Paying Living Expenses:   Food Insecurity:   . Worried About Charity fundraiser in the Last Year:   . Arboriculturist in the Last Year:   Transportation Needs:   . Film/video editor (Medical):   Marland Kitchen Lack of Transportation (Non-Medical):   Physical Activity:   . Days of Exercise per Week:   . Minutes of Exercise per Session:   Stress:   . Feeling of Stress :   Social Connections:   . Frequency of Communication with Friends and Family:   . Frequency of Social Gatherings with Friends and Family:   . Attends Religious Services:   . Active Member of Clubs or Organizations:   . Attends Archivist Meetings:   Marland Kitchen Marital Status:   Intimate Partner Violence:   . Fear of Current or Ex-Partner:   . Emotionally Abused:   Marland Kitchen Physically Abused:   . Sexually Abused:     Review of Systems  Respiratory: Positive for apnea.   Cardiovascular: Negative for chest pain.  Psychiatric/Behavioral: Positive for sleep disturbance.  All other systems reviewed and are negative.   Vitals:   01/09/20 1028  BP: 116/80  Pulse: 71  Temp: 98.4 F (36.9 C)  SpO2: 96%     Physical Exam  Constitutional: He appears well-developed.  HENT:  Head: Normocephalic.  Crowded oropharynx  Eyes: Pupils are equal, round, and reactive to light. Right eye exhibits no discharge. Left eye exhibits no discharge.  Neck: No tracheal deviation present. No thyromegaly present.  Cardiovascular: Normal rate.  Pulmonary/Chest: Effort normal and breath sounds normal. No respiratory distress. He has no wheezes. He has no rales. He exhibits no tenderness.  Neurological: He is alert. No cranial nerve deficit.  Psychiatric: He has a normal mood and affect.     Results of the Epworth flowsheet 01/09/2020  Sitting and reading 2  Watching TV 2  Sitting, inactive in a public place (e.g. a theatre or a meeting) 2  As a passenger in a car for an hour without a break 3  Lying down to rest in the afternoon when circumstances permit 3  Sitting and talking to someone  0  Sitting quietly after a lunch without alcohol 2  In a car, while stopped for a few minutes in traffic 0  Total score 14   Assessment:  Excessive daytime sleepiness  Obesity  Past history of obstructive sleep apnea  Significant history of depression and anxiety  Pathophysiology of sleep disordered breathing discussed Treatment options discussed  Plan/Recommendations: We will schedule the patient for home sleep study  Treatment options will include CPAP therapy  Follow-up to discuss plans for daytime sleepiness  Encouraged to call with any significant concerns  Continue to focus on weight loss efforts   Sherrilyn Rist MD [facility] 01/09/2020, 10:54 AM  CC: [name], [place]*

## 2020-01-10 ENCOUNTER — Encounter: Payer: Self-pay | Admitting: Internal Medicine

## 2020-01-10 DIAGNOSIS — E1159 Type 2 diabetes mellitus with other circulatory complications: Secondary | ICD-10-CM

## 2020-01-10 NOTE — Telephone Encounter (Signed)
Please advise 

## 2020-01-15 ENCOUNTER — Encounter: Payer: Self-pay | Admitting: Internal Medicine

## 2020-01-15 MED ORDER — PRAZOSIN HCL 2 MG PO CAPS: ORAL_CAPSULE | ORAL | 0 refills | Status: DC

## 2020-01-15 MED ORDER — GABAPENTIN 300 MG PO CAPS: 300.0000 mg | ORAL_CAPSULE | Freq: Three times a day (TID) | ORAL | 0 refills | Status: DC

## 2020-01-15 MED ORDER — LAMOTRIGINE 200 MG PO TABS: 200.0000 mg | ORAL_TABLET | Freq: Every day | ORAL | 0 refills | Status: DC

## 2020-01-19 ENCOUNTER — Other Ambulatory Visit: Payer: Self-pay | Admitting: Internal Medicine

## 2020-01-22 ENCOUNTER — Telehealth (INDEPENDENT_AMBULATORY_CARE_PROVIDER_SITE_OTHER): Payer: 59 | Admitting: Family Medicine

## 2020-01-22 ENCOUNTER — Encounter: Payer: Self-pay | Admitting: Family Medicine

## 2020-01-22 VITALS — Temp 98.2°F | Wt 280.0 lb

## 2020-01-22 DIAGNOSIS — J989 Respiratory disorder, unspecified: Secondary | ICD-10-CM | POA: Diagnosis not present

## 2020-01-22 NOTE — Progress Notes (Signed)
Virtual Visit via Video Note  I connected with Ej  on 01/22/20 at 10:20 AM EDT by a video enabled telemedicine application and verified that I am speaking with the correct person using two identifiers.  Location patient: home, [place] Location provider:work or home office Persons participating in the virtual visit: patient, provider, Mother  I discussed the limitations of evaluation and management by telemedicine and the availability of in person appointments. The patient expressed understanding and agreed to proceed.   HPI:  Acute visit for sinus issues: -started about 8 days ago -symptoms include fatigue initially, lost of nasal congestion, headache, PND - he thought he had a head cold -felt better after about 6 days and went and got his 2nd pfizer shot -felt very tired and developed chills the night after getting the pfizer shot (did not have thermometer) -the next day temp was normal -has been feeling better, so went to work yesterday but was sent home due to symptoms -today still feels very congested - clear nasal congestion, cough - feels like drainage in the throat, temp is 97.2 which is his normal, lower energy than his usual -no sinus pain, tooth pain, SOB, asthma symptoms, rash, NVD, loss of taste, no known sick contacts -had been around folks at work and his family - they do wear masks at work -has a hx of seasonal allergies -needs work note  ROS: See pertinent positives and negatives per HPI.  Past Medical History:  Diagnosis Date  . Anxiety   . Asthma   . Depression   . Diabetes ([place])   . DVT (deep venous thrombosis), hx of recurrent 05/23/2012  . Headache(784.0)    frequent  . History of blood clot in brain, 2012 - followed by [facility] 05/23/2012  . History of blood clots   . History of suicidal tendencies   . Hyperlipemia 08/25/2012  . Hyperlipidemia   . Kidney disease   . Kidney stones   . Migraines   . Obesity   . Pneumonia   . Seizure ([place])   .  Seizure disorder - followed by [facility] 05/23/2012  . Sleep apnea   . Tuberculosis     Past Surgical History:  Procedure Laterality Date  . BRAIN SURGERY    . filter for blood clots      Family History  Problem Relation Age of Onset  . Hyperlipidemia Mother   . Heart disease Father   . Stroke Other        parent  . Diabetes Other        grandparent /parent  . Obesity Other   . Heart attack Other     SOCIAL HX: see hpi   Current Outpatient Medications:  .  atorvastatin (LIPITOR) 40 MG tablet, TAKE 1 TABLET BY MOUTH EVERYDAY AT BEDTIME, Disp: 90 tablet, Rfl: 0 .  buPROPion (WELLBUTRIN XL) 150 MG 24 hr tablet, TAKE 3 TABLETS (450 MG TOTAL) BY MOUTH DAILY., Disp: 30 tablet, Rfl: 0 .  canagliflozin (INVOKANA) 300 MG TABS tablet, Take 1 tablet (300 mg total) by mouth daily., Disp: 90 tablet, Rfl: 3 .  enoxaparin (LOVENOX) 120 MG/0.8ML injection, Inject 0.8 mLs (120 mg total) into the skin every 12 (twelve) hours., Disp: 48 mL, Rfl: 1 .  gabapentin (NEURONTIN) 300 MG capsule, Take 1 capsule (300 mg total) by mouth 3 (three) times daily., Disp: 90 capsule, Rfl: 0 .  glimepiride (AMARYL) 4 MG tablet, Take 1 tablet (4 mg total) by mouth daily with breakfast., Disp:  90 tablet, Rfl: 3 .  hydrOXYzine (ATARAX/VISTARIL) 50 MG tablet, Take 50 mg by mouth every 6 (six) hours as needed for anxiety., Disp: , Rfl:  .  lamoTRIgine (LAMICTAL) 200 MG tablet, Take 1 tablet (200 mg total) by mouth daily., Disp: 30 tablet, Rfl: 0 .  LORazepam (ATIVAN) 0.5 MG tablet, Take 0.5 mg by mouth daily as needed for anxiety., Disp: , Rfl:  .  metFORMIN (GLUCOPHAGE-XR) 500 MG 24 hr tablet, Take 1 tablet (500 mg total) by mouth daily with breakfast., Disp: 90 tablet, Rfl: 3 .  prazosin (MINIPRESS) 2 MG capsule, 2 tabs at bedtime, Disp: 60 capsule, Rfl: 0 .  traZODone (DESYREL) 50 MG tablet, Take 100 mg by mouth at bedtime., Disp: , Rfl:   EXAM:  VITALS per patient if applicable:  GENERAL: alert,  oriented, appears well and in no acute distress  HEENT: atraumatic, conjunttiva clear, no obvious abnormalities on inspection of external nose and ears  NECK: normal movements of the head and neck  LUNGS: on inspection no signs of respiratory distress, breathing rate appears normal, no obvious gross SOB, gasping or wheezing  CV: no obvious cyanosis  MS: moves all visible extremities without noticeable abnormality  PSYCH/NEURO: pleasant and cooperative, no obvious depression or anxiety, speech and thought processing grossly intact  ASSESSMENT AND PLAN:  Discussed the following assessment and plan:  Respiratory illness  -we discussed possible serious and likely etiologies, options for evaluation and workup, limitations of telemedicine visit vs in person visit, treatment, treatment risks and precautions. Pt prefers to treat via telemedicine empirically rather then risking or undertaking an in person visit at this moment. Query viral uri vs other. He may have also had some side effects to the vaccine. Discussed symptomatic care - seems he is improving. Discussed possibility of COVID19, testing and limitation, isolation when sick, precautions. Work note provided. Patient agrees to seek prompt in person care if worsening, new symptoms arise, or if is not improving with treatment.   I discussed the assessment and treatment plan with the patient. The patient was provided an opportunity to ask questions and all were answered. The patient agreed with the plan and demonstrated an understanding of the instructions.   The patient was advised to call back or seek an in-person evaluation if the symptoms worsen or if the condition fails to improve as anticipated.   Lucretia Kern, DO

## 2020-01-22 NOTE — Patient Instructions (Addendum)
INSTRUCTIONS FOR UPPER RESPIRATORY INFECTION:  -plenty of rest and fluids  -nasal saline wash 2-3 times daily (use prepackaged nasal saline or bottled/distilled water if making your own)   -can use AFRIN nasal spray for drainage and nasal congestion - but do NOT use longer then 3-4 days  -can use tylenol (in no history of liver disease) or ibuprofen (if no history of kidney disease, bowel bleeding or significant heart disease) as directed for aches and sorethroat  -in the winter time, using a humidifier at night is helpful (please follow cleaning instructions)  -if you are taking a cough medication - use only as directed, may also try a teaspoon of honey to coat the throat and throat lozenges.  -for sore throat, salt water gargles can help  -follow up if you have fevers, facial pain, tooth pain, difficulty breathing or are worsening or symptoms persist longer then expected  Upper Respiratory Infection, Adult An upper respiratory infection (URI) is also known as the common cold. It is often caused by a type of germ (virus). Colds are easily spread (contagious). You can pass it to others by kissing, coughing, sneezing, or drinking out of the same glass. Usually, you get better in 1 to 3  weeks.  However, the cough can last for even longer. HOME CARE   Only take medicine as told by your doctor. Follow instructions provided above.  Drink enough water and fluids to keep your pee (urine) clear or pale yellow.  Get plenty of rest.  Return to work when your temperature is < 100 for 24 hours or as told by your doctor. You may use a face mask and wash your hands to stop your cold from spreading. GET HELP RIGHT AWAY IF:   After the first few days, you feel you are getting worse.  You have questions about your medicine.  You have chills, shortness of breath, or red spit (mucus).  You have pain in the face for more then 1-2 days, especially when you bend forward.  You have a fever, puffy  (swollen) neck, pain when you swallow, or white spots in the back of your throat.  You have a bad headache, ear pain, sinus pain, or chest pain.  You have a high-pitched whistling sound when you breathe in and out (wheezing).  You cough up blood.  You have sore muscles or a stiff neck. MAKE SURE YOU:   Understand these instructions.  Will watch your condition.  Will get help right away if you are not doing well or get worse. Document Released: 01/05/2008 Document Revised: 10/11/2011 Document Reviewed: 10/24/2013 [facility] Patient Information 2015 [place], [place]. This information is not intended to replace advice given to you by your health care provider. Make sure you discuss any questions you have with your health care provider.       WORK SLIP:  Patient Bryan Mcmillan,  March 21, 1971, was seen for a medical visit today, 01/22/2020. Please excuse from work according to the [facility] guidelines for a COVID like illness. We advise 10 days minimum from the onset of symptoms (01/14/20)PLUS 1 day of no fever and improved symptoms. Will defer to employer for a sooner return to work if Bryan Mcmillan testing is negative and the symptoms have resolved. Advise following CDC guidelines.   Sincerely: E-signature: Dr. Colin Benton, DO [place] Ph: [phone]

## 2020-01-28 ENCOUNTER — Encounter (HOSPITAL_COMMUNITY): Payer: Self-pay | Admitting: Psychiatry

## 2020-01-28 ENCOUNTER — Other Ambulatory Visit: Payer: Self-pay

## 2020-01-28 ENCOUNTER — Telehealth (INDEPENDENT_AMBULATORY_CARE_PROVIDER_SITE_OTHER): Payer: 59 | Admitting: Psychiatry

## 2020-01-28 DIAGNOSIS — F411 Generalized anxiety disorder: Secondary | ICD-10-CM | POA: Insufficient documentation

## 2020-01-28 DIAGNOSIS — E1159 Type 2 diabetes mellitus with other circulatory complications: Secondary | ICD-10-CM

## 2020-01-28 DIAGNOSIS — F331 Major depressive disorder, recurrent, moderate: Secondary | ICD-10-CM | POA: Diagnosis not present

## 2020-01-28 MED ORDER — LORAZEPAM 0.5 MG PO TABS: 0.5000 mg | ORAL_TABLET | Freq: Every day | ORAL | 2 refills | Status: DC | PRN

## 2020-01-28 MED ORDER — BUPROPION HCL ER (XL) 150 MG PO TB24: 450.0000 mg | ORAL_TABLET | Freq: Every day | ORAL | 2 refills | Status: DC

## 2020-01-28 MED ORDER — PRAZOSIN HCL 2 MG PO CAPS: ORAL_CAPSULE | ORAL | 2 refills | Status: DC

## 2020-01-28 MED ORDER — TRAZODONE HCL 50 MG PO TABS: 50.0000 mg | ORAL_TABLET | Freq: Every evening | ORAL | 2 refills | Status: DC | PRN

## 2020-01-28 MED ORDER — HYDROXYZINE HCL 50 MG PO TABS: 50.0000 mg | ORAL_TABLET | Freq: Three times a day (TID) | ORAL | 2 refills | Status: DC | PRN

## 2020-01-28 MED ORDER — GABAPENTIN 300 MG PO CAPS: 300.0000 mg | ORAL_CAPSULE | Freq: Three times a day (TID) | ORAL | 2 refills | Status: DC

## 2020-01-28 MED ORDER — LAMOTRIGINE 200 MG PO TABS: 200.0000 mg | ORAL_TABLET | Freq: Every evening | ORAL | 2 refills | Status: DC
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
## 2020-02-13 ENCOUNTER — Ambulatory Visit: Payer: Self-pay

## 2020-02-13 ENCOUNTER — Other Ambulatory Visit: Payer: Self-pay

## 2020-02-13 DIAGNOSIS — R0683 Snoring: Secondary | ICD-10-CM

## 2020-02-13 DIAGNOSIS — G4733 Obstructive sleep apnea (adult) (pediatric): Secondary | ICD-10-CM

## 2020-02-17 ENCOUNTER — Other Ambulatory Visit: Payer: Self-pay | Admitting: Hematology and Oncology

## 2020-02-17 DIAGNOSIS — D689 Coagulation defect, unspecified: Secondary | ICD-10-CM

## 2020-02-18 ENCOUNTER — Emergency Department (HOSPITAL_COMMUNITY)
Admission: EM | Admit: 2020-02-18 | Discharge: 2020-02-18 | Disposition: A | Discharge status: Home / Self Care | Payer: BC Managed Care – PPO | Source: Home / Self Care | Attending: Emergency Medicine | Admitting: Emergency Medicine

## 2020-02-18 ENCOUNTER — Other Ambulatory Visit: Payer: Self-pay

## 2020-02-18 ENCOUNTER — Encounter: Payer: Self-pay | Admitting: Hematology and Oncology

## 2020-02-18 ENCOUNTER — Inpatient Hospital Stay: Payer: BC Managed Care – PPO | Attending: Hematology and Oncology | Admitting: Hematology and Oncology

## 2020-02-18 ENCOUNTER — Encounter (HOSPITAL_COMMUNITY): Payer: Self-pay

## 2020-02-18 ENCOUNTER — Telehealth (HOSPITAL_COMMUNITY): Payer: Self-pay | Admitting: Pulmonary Disease

## 2020-02-18 ENCOUNTER — Inpatient Hospital Stay: Payer: BC Managed Care – PPO

## 2020-02-18 VITALS — BP 127/81 | HR 78 | Temp 98.8°F | Resp 20 | Ht 71.0 in | Wt 271.6 lb

## 2020-02-18 DIAGNOSIS — X501XXA Overexertion from prolonged static or awkward postures, initial encounter: Secondary | ICD-10-CM | POA: Insufficient documentation

## 2020-02-18 DIAGNOSIS — G40909 Epilepsy, unspecified, not intractable, without status epilepticus: Secondary | ICD-10-CM | POA: Insufficient documentation

## 2020-02-18 DIAGNOSIS — Z87891 Personal history of nicotine dependence: Secondary | ICD-10-CM | POA: Insufficient documentation

## 2020-02-18 DIAGNOSIS — Z87442 Personal history of urinary calculi: Secondary | ICD-10-CM | POA: Diagnosis not present

## 2020-02-18 DIAGNOSIS — E119 Type 2 diabetes mellitus without complications: Secondary | ICD-10-CM | POA: Insufficient documentation

## 2020-02-18 DIAGNOSIS — M5442 Lumbago with sciatica, left side: Secondary | ICD-10-CM | POA: Insufficient documentation

## 2020-02-18 DIAGNOSIS — Z79899 Other long term (current) drug therapy: Secondary | ICD-10-CM | POA: Insufficient documentation

## 2020-02-18 DIAGNOSIS — Z7984 Long term (current) use of oral hypoglycemic drugs: Secondary | ICD-10-CM | POA: Insufficient documentation

## 2020-02-18 DIAGNOSIS — Y9289 Other specified places as the place of occurrence of the external cause: Secondary | ICD-10-CM | POA: Insufficient documentation

## 2020-02-18 DIAGNOSIS — J45909 Unspecified asthma, uncomplicated: Secondary | ICD-10-CM | POA: Insufficient documentation

## 2020-02-18 DIAGNOSIS — G4733 Obstructive sleep apnea (adult) (pediatric): Secondary | ICD-10-CM

## 2020-02-18 DIAGNOSIS — E785 Hyperlipidemia, unspecified: Secondary | ICD-10-CM | POA: Insufficient documentation

## 2020-02-18 DIAGNOSIS — Z86718 Personal history of other venous thrombosis and embolism: Secondary | ICD-10-CM | POA: Diagnosis present

## 2020-02-18 DIAGNOSIS — Y990 Civilian activity done for income or pay: Secondary | ICD-10-CM | POA: Insufficient documentation

## 2020-02-18 DIAGNOSIS — E669 Obesity, unspecified: Secondary | ICD-10-CM | POA: Insufficient documentation

## 2020-02-18 DIAGNOSIS — D689 Coagulation defect, unspecified: Secondary | ICD-10-CM

## 2020-02-18 DIAGNOSIS — Y939 Activity, unspecified: Secondary | ICD-10-CM | POA: Insufficient documentation

## 2020-02-18 DIAGNOSIS — I10 Essential (primary) hypertension: Secondary | ICD-10-CM | POA: Insufficient documentation

## 2020-02-18 DIAGNOSIS — F418 Other specified anxiety disorders: Secondary | ICD-10-CM | POA: Diagnosis not present

## 2020-02-18 LAB — CMP (CANCER CENTER ONLY)
ALT: 21 U/L (ref 0–44)
AST: 14 U/L — ABNORMAL LOW (ref 15–41)
Albumin: 4 g/dL (ref 3.5–5.0)
Alkaline Phosphatase: 77 U/L (ref 38–126)
Anion gap: 9 (ref 5–15)
BUN: 16 mg/dL (ref 6–20)
CO2: 23 mmol/L (ref 22–32)
Calcium: 9.4 mg/dL (ref 8.9–10.3)
Chloride: 110 mmol/L (ref 98–111)
Creatinine: 0.93 mg/dL (ref 0.61–1.24)
GFR, Est AFR Am: >60 mL/min (ref >60)
GFR, Estimated: >60 mL/min (ref >60)
Glucose, Bld: 117 mg/dL — ABNORMAL HIGH (ref 70–99)
Potassium: 4.1 mmol/L (ref 3.5–5.1)
Sodium: 142 mmol/L (ref 135–145)
Total Bilirubin: 0.5 mg/dL (ref 0.3–1.2)
Total Protein: 7.4 g/dL (ref 6.5–8.1)

## 2020-02-18 LAB — CBC WITH DIFFERENTIAL (CANCER CENTER ONLY)
Abs Immature Granulocytes: 0.05 10*3/uL (ref 0.00–0.07)
Basophils Absolute: 0.1 10*3/uL (ref 0.0–0.1)
Basophils Relative: 1 %
Eosinophils Absolute: 0.1 10*3/uL (ref 0.0–0.5)
Eosinophils Relative: 2 %
HCT: 44.4 % (ref 39.0–52.0)
Hemoglobin: 14.7 g/dL (ref 13.0–17.0)
Immature Granulocytes: 1 %
Lymphocytes Relative: 37 %
Lymphs Abs: 2.8 10*3/uL (ref 0.7–4.0)
MCH: 28.8 pg (ref 26.0–34.0)
MCHC: 33.1 g/dL (ref 30.0–36.0)
MCV: 86.9 fL (ref 80.0–100.0)
Monocytes Absolute: 0.5 10*3/uL (ref 0.1–1.0)
Monocytes Relative: 7 %
Neutro Abs: 4 10*3/uL (ref 1.7–7.7)
Neutrophils Relative %: 52 %
Platelet Count: 291 10*3/uL (ref 150–400)
RBC: 5.11 MIL/uL (ref 4.22–5.81)
RDW: 12.7 % (ref 11.5–15.5)
WBC Count: 7.5 10*3/uL (ref 4.0–10.5)
nRBC: 0 % (ref 0.0–0.2)

## 2020-02-18 MED ORDER — LIDOCAINE 5 % EX PTCH
1.0000 | MEDICATED_PATCH | CUTANEOUS | Status: DC
  Administered 2020-02-18: 1 via TRANSDERMAL
  Filled 2020-02-18: qty 1

## 2020-02-18 MED ORDER — KETOROLAC TROMETHAMINE 30 MG/ML IJ SOLN
30.0000 mg | Freq: Once | INTRAMUSCULAR | Status: AC
  Administered 2020-02-18: 30 mg via INTRAMUSCULAR
  Filled 2020-02-18: qty 1

## 2020-02-18 MED ORDER — NAPROXEN 500 MG PO TABS: 500.0000 mg | ORAL_TABLET | Freq: Two times a day (BID) | ORAL | 0 refills | Status: DC

## 2020-02-18 MED ORDER — LIDOCAINE 5 % EX PTCH: 1.0000 | MEDICATED_PATCH | CUTANEOUS | 0 refills | Status: DC

## 2020-02-18 MED ORDER — ENOXAPARIN SODIUM 120 MG/0.8ML ~~LOC~~ SOLN: 120.0000 mg | Freq: Two times a day (BID) | SUBCUTANEOUS | 5 refills | Status: DC

## 2020-02-18 NOTE — ED Triage Notes (Signed)
Patient c/o left lower back pain that radiates into the left leg x 3 days

## 2020-02-18 NOTE — ED Provider Notes (Addendum)
[facility] DEPT Provider Note   CSN: [id number] Arrival date & time: 02/18/20  1050    History Chief Complaint  Patient presents with  . Back Pain   Bryan Mcmillan is a 49 y.o. male with possible history significant for diabetes, DVT, hypertension, seizure, OSA who presents for evaluation of back pain.  Patient states he was at work" twisted" the wrong way.  Patient states he has had pain which starts in his lumbar area, radiates into his coccyx and the posterior aspect of his leg.  This terminates just prior to his popliteal fossa.  Denies falling on this area.  No history IV drug use, bowel or bladder incontinence, saddle paresthesia, history of malignancy.  Does have a history of DVT, PE with possible clotting disorder however he is not currently on anticoagulation.  He did follow with oncology today was started on Lovenox.  He denies any chest pain, shortness of breath, hemoptysis.  No unilateral leg swelling, redness or warmth.  Dates he did have back surgery at [facility] many years ago for a pinched disc.  Denies fever, chills, nausea vomiting, chest pain, shortness breath abdominal pain, diarrhea, dysuria.  Denies additional rating or relieving factors.  Rates his pain a 9/10.  Has been taking anti-inflammatories and cold compress without relief. Pain worse with movement. Ambulatory without difficulty.  History obtained from patient and past medical records.  No interpreter used.  HPI     Past Medical History:  Diagnosis Date  . Anxiety   . Asthma   . Depression   . Diabetes ([place])   . DVT (deep venous thrombosis), hx of recurrent 05/23/2012  . Headache(784.0)    frequent  . History of blood clot in brain, 2012 - followed by [facility] 05/23/2012  . History of blood clots   . History of suicidal tendencies   . Hyperlipemia 08/25/2012  . Hyperlipidemia   . Kidney disease   . Kidney stones   . Migraines   . Obesity   .  Pneumonia   . Seizure ([place])   . Seizure disorder - followed by [facility] 05/23/2012  . Sleep apnea   . Tuberculosis     Patient Active Problem List   Diagnosis Date Noted  . GAD (generalized anxiety disorder) 01/28/2020  . Subcutaneous nodule 07/09/2015  . Chest pain due to psychological stress 11/21/2013  . Major depressive disorder, recurrent episode, moderate ([place]) 09/24/2013  . Speech abnormality 07/11/2013  . Major depression, recurrent ([place]) 03/20/2013  . Brain tumor, glioma ([place]) 11/08/2012  . Hyperlipemia 08/25/2012  . Migraine -followed by [facility] 08/25/2012  . Diabetes type 2, controlled ([place]) 08/25/2012  . DVT (deep venous thrombosis), hx of recurrent 05/23/2012  . Pulmonary embolism ([place]) 05/23/2012  . Seizure disorder - followed by [facility] 05/23/2012    Past Surgical History:  Procedure Laterality Date  . BRAIN SURGERY    . filter for blood clots         Family History  Problem Relation Age of Onset  . Hyperlipidemia Mother   . Depression Mother   . Heart disease Father   . Stroke Other        parent  . Diabetes Other        grandparent /parent  . Obesity Other   . Heart attack Other     Social History   Tobacco Use  . Smoking status: Former Research scientist (life sciences)  . Smokeless tobacco: Never Used  Vaping Use  .  Vaping Use: Never used  Substance Use Topics  . Alcohol use: No  . Drug use: No    Home Medications Prior to Admission medications   Medication Sig Start Date End Date Taking? Authorizing Provider  atorvastatin (LIPITOR) 40 MG tablet TAKE 1 TABLET BY MOUTH EVERYDAY AT BEDTIME 11/20/19   [name], Rayford Halsted, MD  buPROPion (WELLBUTRIN XL) 150 MG 24 hr tablet Take 3 tablets (450 mg total) by mouth daily. 01/28/20 04/27/20  Pucilowski, Marchia Bond, MD  canagliflozin (INVOKANA) 300 MG TABS tablet Take 1 tablet (300 mg total) by mouth daily. 08/14/19   Renato Shin, MD  enoxaparin (LOVENOX) 120 MG/0.8ML injection Inject 0.8 mLs (120 mg  total) into the skin every 12 (twelve) hours. 02/18/20   Orson Slick, MD  gabapentin (NEURONTIN) 300 MG capsule Take 1 capsule (300 mg total) by mouth 3 (three) times daily. 01/28/20 04/27/20  Pucilowski, Marchia Bond, MD  glimepiride (AMARYL) 4 MG tablet Take 1 tablet (4 mg total) by mouth daily with breakfast. 08/10/19   Renato Shin, MD  hydrOXYzine (ATARAX/VISTARIL) 50 MG tablet Take 1 tablet (50 mg total) by mouth 3 (three) times daily as needed for anxiety. 01/28/20 04/27/20  Pucilowski, Marchia Bond, MD  lamoTRIgine (LAMICTAL) 200 MG tablet Take 1 tablet (200 mg total) by mouth at bedtime. 01/28/20 04/27/20  Pucilowski, Olgierd A, MD  lidocaine (LIDODERM) 5 % Place 1 patch onto the skin daily. Remove & Discard patch within 12 hours or as directed by MD 02/18/20   Paloma Grange A, PA-C  LORazepam (ATIVAN) 0.5 MG tablet Take 1 tablet (0.5 mg total) by mouth daily as needed for anxiety. 01/28/20 04/27/20  Pucilowski, Marchia Bond, MD  metFORMIN (GLUCOPHAGE-XR) 500 MG 24 hr tablet Take 1 tablet (500 mg total) by mouth daily with breakfast. 01/03/20   Renato Shin, MD  naproxen (NAPROSYN) 500 MG tablet Take 1 tablet (500 mg total) by mouth 2 (two) times daily. 02/18/20   Aleisha Paone A, PA-C  prazosin (MINIPRESS) 2 MG capsule 2 tabs at bedtime 01/28/20   Pucilowski, Olgierd A, MD  traZODone (DESYREL) 50 MG tablet Take 1 tablet (50 mg total) by mouth at bedtime as needed and may repeat dose one time if needed for sleep. 01/28/20 04/27/20  Pucilowski, Marchia Bond, MD    Allergies    Hydrocodone-acetaminophen and Penicillins  Review of Systems   Review of Systems  Constitutional: Negative.   HENT: Negative.   Respiratory: Negative.   Cardiovascular: Negative.   Gastrointestinal: Negative.   Genitourinary: Negative.   Musculoskeletal: Positive for back pain. Negative for arthralgias, gait problem, joint swelling, myalgias, neck pain and neck stiffness.  Skin: Negative.   Neurological: Negative.     Hematological: Negative.   Psychiatric/Behavioral: Negative.   All other systems reviewed and are negative.  Physical Exam Updated Vital Signs BP 121/81 (BP Location: Left Arm)   Pulse 72   Temp 98.1 F (36.7 C) (Oral)   Resp 12   Ht 5\' 10"  (1.778 m)   Wt 123.4 kg   SpO2 97%   BMI 39.03 kg/m   Physical Exam Physical Exam  Constitutional: Pt appears well-developed and well-nourished. No distress.  HENT:  Head: Normocephalic and atraumatic.  Mouth/Throat: Oropharynx is clear and moist. No oropharyngeal exudate.  Eyes: Conjunctivae are normal.  Neck: Normal range of motion. Neck supple.  Full ROM without pain  Cardiovascular: Normal rate, regular rhythm and intact distal pulses.   Pulmonary/Chest: Effort normal and breath sounds normal.  No respiratory distress. Pt has no wheezes.  Abdominal: Soft. Pt exhibits no distension. There is no tenderness, rebound or guarding. No abd bruit or pulsatile mass Musculoskeletal:  Full range of motion of the T-spine and L-spine with flexion, hyperextension, and lateral flexion. No midline tenderness or stepoffs. No tenderness to palpation of the spinous processes of the T-spine or L-spine. Moderate tenderness to palpation of the paraspinous muscles of the L-spine on LEFT. Positive straight leg raise on left at 40*.  Numbness over left piriformis and into SI.  Compartments soft bilaterally.  No tenderness to bilateral calves.  Bevelyn Buckles' sign negative.  No tenderness popliteal fossa. Lymphadenopathy:    Pt has no cervical adenopathy.  Neurological: Pt is alert. Pt has normal reflexes.  Reflex Scores:      Bicep reflexes are 2+ on the right side and 2+ on the left side.      Brachioradialis reflexes are 2+ on the right side and 2+ on the left side.      Patellar reflexes are 2+ on the right side and 2+ on the left side.      Achilles reflexes are 2+ on the right side and 2+ on the left side. Speech is clear and goal oriented, follows  commands Normal 5/5 strength in upper and lower extremities bilaterally including dorsiflexion and plantar flexion, strong and equal grip strength Sensation normal to light and sharp touch Moves extremities without ataxia, coordination intact Normal gait Normal balance No Clonus Skin: Skin is warm and dry. No rash noted or lesions noted. Pt is not diaphoretic. No erythema, ecchymosis,edema or warmth.  Psychiatric: Pt has a normal mood and affect. Behavior is normal.  Nursing note and vitals reviewed. ED Results / Procedures / Treatments   Labs (all labs ordered are listed, but only abnormal results are displayed) Labs Reviewed - No data to display  EKG None  Radiology No results found.  Procedures Procedures (including critical care time)  Medications Ordered in ED Medications  lidocaine (LIDODERM) 5 % 1 patch (1 patch Transdermal Patch Applied 02/18/20 1639)  ketorolac (TORADOL) 30 MG/ML injection 30 mg (30 mg Intramuscular Given 02/18/20 1639)   ED Course  I have reviewed the triage vital signs and the nursing notes.  Pertinent labs & imaging results that were available during my care of the patient were reviewed by me and considered in my medical decision making (see chart for details).  49 year old male presents for evaluation of back pain.  History of back pain with [facility]surgical intervention [facility].  Was at work and "tweaked" his back.  He denies any falls.  He is afebrile, nonseptic, not ill-appearing.  Pain begins at left lumbar area, radiates into his SI, piriformis down the posterior aspect of his leg terminating just before his popliteal.  Does have history of clotting disorder, is not currently anticoagulated except was seen by hematology today and restarted on Lovenox.  No unilateral leg swelling, redness or warmth.  No bony tenderness lower extremities.  Compartments soft.  No unilateral leg swelling, redness or warmth.  He has no  tachycardia, tachypnea or hypoxia.  Low suspicion for VTE, PE as cause of his back pain.  Is positive straight leg raise on left.  No history IV drug use, bowel or bladder incontinence, saddle paresthesia, malignancy.  I have low suspicion for acute neurosurgical emergency such as cauda equina, discitis, osteomyelitis, transverse myelitis, psoas abscess or acute bacterial infectious process.  Will treat as sciatica and  have him follow-up outpatient with orthopedics or neurosurgery.  He will return for any worsening symptoms.  Ambulatory in room without difficulty.  The patient has been appropriately medically screened and/or stabilized in the ED. I have low suspicion for any other emergent medical condition which would require further screening, evaluation or treatment in the ED or require inpatient management.  Patient is hemodynamically stable and in no acute distress.  Patient able to ambulate in department prior to ED.  Evaluation does not show acute pathology that would require ongoing or additional emergent interventions while in the emergency department or further inpatient treatment.  I have discussed the diagnosis with the patient and answered all questions.  Pain is been managed while in the emergency department and patient has no further complaints prior to discharge.  Patient is comfortable with plan discussed in room and is stable for discharge at this time.  I have discussed strict return precautions for returning to the emergency department.  Patient was encouraged to follow-up with PCP/specialist refer to at discharge.   MDM Rules/Calculators/A&P                          Final Clinical Impression(s) / ED Diagnoses Final diagnoses:  Acute left-sided low back pain with left-sided sciatica    Rx / DC Orders ED Discharge Orders         Ordered    lidocaine (LIDODERM) 5 %  Every 24 hours     Discontinue  Reprint     02/18/20 1644    naproxen (NAPROSYN) 500 MG tablet  2 times daily      Discontinue  Reprint     02/18/20 1644           Aldwin Micalizzi A, PA-C 02/18/20 1647    Derius Ghosh A, PA-C 02/18/20 1648    Breck Coons, MD 02/18/20 2240

## 2020-02-18 NOTE — Telephone Encounter (Signed)
Call patient  Sleep study result  Date of study: 02/13/2020  Impression: Moderate obstructive sleep apnea Moderate oxygen desaturations  Recommendation: Recommend CPAP therapy for moderate obstructive sleep apnea Auto titrating CPAP at a setting of 5-15 will be appropriate for treatment  Follow-up as previously scheduled
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
## 2020-02-18 NOTE — Discharge Instructions (Signed)
Take the medications as prescribed  Return for new or worsening symptoms 

## 2020-02-19 ENCOUNTER — Telehealth: Payer: Self-pay | Admitting: Hematology and Oncology

## 2020-02-19 NOTE — Telephone Encounter (Signed)
Scheduled per los. Called and left msg. Mailed printout  °

## 2020-02-22 ENCOUNTER — Other Ambulatory Visit: Payer: Self-pay | Admitting: Internal Medicine

## 2020-02-25 ENCOUNTER — Telehealth (INDEPENDENT_AMBULATORY_CARE_PROVIDER_SITE_OTHER): Payer: BC Managed Care – PPO | Admitting: Psychiatry

## 2020-02-25 ENCOUNTER — Other Ambulatory Visit: Payer: Self-pay

## 2020-02-25 DIAGNOSIS — F411 Generalized anxiety disorder: Secondary | ICD-10-CM | POA: Diagnosis not present

## 2020-02-25 DIAGNOSIS — F331 Major depressive disorder, recurrent, moderate: Secondary | ICD-10-CM

## 2020-02-25 MED ORDER — VORTIOXETINE HBR 5 MG PO TABS: ORAL_TABLET | ORAL | 0 refills | Status: DC
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
## 2020-02-26 ENCOUNTER — Other Ambulatory Visit (HOSPITAL_COMMUNITY): Payer: Self-pay | Admitting: Psychiatry

## 2020-02-26 NOTE — Telephone Encounter (Signed)
Contacted patient with results of home sleep study, patient agrees to start CPAP therapy. Order placed to DME, follow up recall placed for 3 months. Patient verbalized understanding of results and plan of care.

## 2020-03-21 ENCOUNTER — Other Ambulatory Visit: Payer: Self-pay | Admitting: Internal Medicine

## 2020-03-25 ENCOUNTER — Encounter: Payer: Self-pay | Admitting: Internal Medicine

## 2020-03-25 NOTE — Telephone Encounter (Signed)
On 7/27 we ordered a referral to DME for patient to be started on a cpap, new start.  Patient has never been contacted regarding this, and there is no documentation on this order.  PCCs please advise on status of referral.  Thanks!

## 2020-03-27 ENCOUNTER — Other Ambulatory Visit (HOSPITAL_COMMUNITY): Payer: Self-pay | Admitting: Psychiatry

## 2020-03-28 ENCOUNTER — Other Ambulatory Visit (HOSPITAL_COMMUNITY): Payer: Self-pay | Admitting: Psychiatry

## 2020-04-01 ENCOUNTER — Other Ambulatory Visit (HOSPITAL_COMMUNITY): Payer: Self-pay | Admitting: Psychiatry

## 2020-04-01 ENCOUNTER — Telehealth (INDEPENDENT_AMBULATORY_CARE_PROVIDER_SITE_OTHER): Payer: BC Managed Care – PPO | Admitting: Psychiatry

## 2020-04-01 ENCOUNTER — Other Ambulatory Visit: Payer: Self-pay

## 2020-04-01 DIAGNOSIS — E1159 Type 2 diabetes mellitus with other circulatory complications: Secondary | ICD-10-CM

## 2020-04-01 DIAGNOSIS — F331 Major depressive disorder, recurrent, moderate: Secondary | ICD-10-CM | POA: Diagnosis not present

## 2020-04-01 DIAGNOSIS — F411 Generalized anxiety disorder: Secondary | ICD-10-CM

## 2020-04-01 MED ORDER — LORAZEPAM 0.5 MG PO TABS: 0.5000 mg | ORAL_TABLET | Freq: Every day | ORAL | 2 refills | Status: DC | PRN

## 2020-04-01 MED ORDER — HYDROXYZINE HCL 50 MG PO TABS: 50.0000 mg | ORAL_TABLET | Freq: Three times a day (TID) | ORAL | 2 refills | Status: AC | PRN

## 2020-04-01 MED ORDER — VORTIOXETINE HBR 20 MG PO TABS
20.0000 mg | ORAL_TABLET | Freq: Every day | ORAL | 2 refills | Status: DC
Start: 2020-04-01 — End: 2020-06-24

## 2020-04-01 MED ORDER — LAMOTRIGINE 200 MG PO TABS: 200.0000 mg | ORAL_TABLET | Freq: Every evening | ORAL | 2 refills | Status: DC

## 2020-04-01 MED ORDER — GABAPENTIN 300 MG PO CAPS: 300.0000 mg | ORAL_CAPSULE | Freq: Three times a day (TID) | ORAL | 2 refills | Status: DC

## 2020-04-01 MED ORDER — PRAZOSIN HCL 2 MG PO CAPS
ORAL_CAPSULE | ORAL | 2 refills | Status: DC
Start: 2020-04-28 — End: 2020-04-01
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
## 2020-04-08 ENCOUNTER — Ambulatory Visit: Payer: BC Managed Care – PPO | Admitting: Internal Medicine

## 2020-04-10 ENCOUNTER — Other Ambulatory Visit: Payer: Self-pay

## 2020-04-10 ENCOUNTER — Encounter: Payer: Self-pay | Admitting: Endocrinology

## 2020-04-10 ENCOUNTER — Ambulatory Visit (INDEPENDENT_AMBULATORY_CARE_PROVIDER_SITE_OTHER): Payer: BC Managed Care – PPO | Admitting: Endocrinology

## 2020-04-10 VITALS — BP 142/76 | HR 90 | Ht 70.0 in | Wt 275.0 lb

## 2020-04-10 DIAGNOSIS — E1159 Type 2 diabetes mellitus with other circulatory complications: Secondary | ICD-10-CM | POA: Diagnosis not present

## 2020-04-10 LAB — POCT GLYCOSYLATED HEMOGLOBIN (HGB A1C): Hemoglobin A1C: 6.9 % — AB (ref 4.0–5.6)

## 2020-04-10 MED ORDER — REPAGLINIDE 2 MG PO TABS
2.0000 mg | ORAL_TABLET | Freq: Three times a day (TID) | ORAL | 11 refills | Status: DC
Start: 2020-04-10 — End: 2020-05-15

## 2020-04-10 NOTE — Progress Notes (Signed)
Subjective:    Patient ID: Bryan Mcmillan, male    DOB: 10-Dec-1970, 49 y.o.   MRN: [id number]  HPI Pt returns for f/u of DM:  DM type: 2 Dx'ed: 6812 Complications: none Therapy: 3 oral meds DKA: never Severe hypoglycemia: never.   Pancreatitis: never Other: he took insulin 2012-2015; he did not tolerate pioglitazone ([place]).   Interval history: pt states cbg's are well-controlled.  pt states he feels well in general, except for slight nausea.  He seldom has hypoglycemia, and these episodes are mild.   Past Medical History:  Diagnosis Date  . Anxiety   . Asthma   . Depression   . Diabetes ([place])   . DVT (deep venous thrombosis), hx of recurrent 05/23/2012  . Headache(784.0)    frequent  . History of blood clot in brain, 2012 - followed by [facility] 05/23/2012  . History of blood clots   . History of suicidal tendencies   . Hyperlipemia 08/25/2012  . Hyperlipidemia   . Kidney disease   . Kidney stones   . Migraines   . Obesity   . Pneumonia   . Seizure ([place])   . Seizure disorder - followed by [facility] 05/23/2012  . Sleep apnea   . Tuberculosis     Past Surgical History:  Procedure Laterality Date  . BRAIN SURGERY    . filter for blood clots      Social History   Socioeconomic History  . Marital status: Divorced    Spouse name: Not on file  . Number of children: 1  . Years of education: BA  . Highest education level: Not on file  Occupational History  . Occupation: CUST SERV    Employer: [employer]  Tobacco Use  . Smoking status: Former Research scientist (life sciences)  . Smokeless tobacco: Never Used  Vaping Use  . Vaping Use: Never used  Substance and Sexual Activity  . Alcohol use: No  . Drug use: No  . Sexual activity: Never  Other Topics Concern  . 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 Difficulty of Paying Living Expenses: Not on file  Food Insecurity:   . Worried About Charity fundraiser in the Last Year: Not on file  . Ran Out of Food in the Last Year: Not on file  Transportation Needs:   . Lack of Transportation (Medical): Not on file  . Lack of Transportation (Non-Medical): Not on file  Physical Activity:   . Days of Exercise per Week: Not on file  . Minutes of Exercise per Session: Not on file  Stress:   . Feeling of Stress : Not on file  Social Connections:   . Frequency of Communication with Friends and Family: Not on file  . Frequency of Social Gatherings with Friends and Family: Not on file  . Attends Religious Services: Not on file  . Active Member of Clubs or Organizations: Not on file  . Attends Archivist Meetings: Not on file  . Marital Status: Not on file  Intimate Partner Violence:   . Fear of  Current or Ex-Partner: Not on file  . Emotionally Abused: Not on file  . Physically Abused: Not on file  . Sexually Abused: Not on file    Current Outpatient Medications on File Prior to Visit  Medication Sig Dispense Refill  . atorvastatin (LIPITOR) 40 MG tablet TAKE 1 TABLET BY MOUTH EVERYDAY AT BEDTIME **NEED PHYSICAL 30 tablet 0  . buPROPion (WELLBUTRIN XL) 150 MG 24 hr tablet TAKE 3 TABLETS (450 MG TOTAL) BY MOUTH DAILY. 270 tablet 1  . canagliflozin (INVOKANA) 300 MG TABS tablet Take 1 tablet (300 mg total) by mouth daily. 90 tablet 3  . enoxaparin (LOVENOX) 120 MG/0.8ML injection Inject 0.8 mLs (120 mg total) into the skin every 12 (twelve) hours. 48 mL 5  . [START ON 04/28/2020] gabapentin (NEURONTIN) 300 MG capsule Take 1 capsule (300 mg total) by mouth 3 (three) times daily. 90 capsule 2  . [START ON 04/28/2020] hydrOXYzine (ATARAX/VISTARIL) 50 MG tablet Take 1 tablet (50 mg total) by mouth 3 (three) times  daily as needed for anxiety. 60 tablet 2  . lamoTRIgine (LAMICTAL) 200 MG tablet TAKE 1 TABLET (200 MG TOTAL) BY MOUTH AT BEDTIME. 90 tablet 0  . lidocaine (LIDODERM) 5 % Place 1 patch onto the skin daily. Remove & Discard patch within 12 hours or as directed by MD 30 patch 0  . [START ON 04/28/2020] LORazepam (ATIVAN) 0.5 MG tablet Take 1 tablet (0.5 mg total) by mouth daily as needed for anxiety. 30 tablet 2  . metFORMIN (GLUCOPHAGE-XR) 500 MG 24 hr tablet Take 1 tablet (500 mg total) by mouth daily with breakfast. 90 tablet 3  . naproxen (NAPROSYN) 500 MG tablet Take 1 tablet (500 mg total) by mouth 2 (two) times daily. 30 tablet 0  . prazosin (MINIPRESS) 2 MG capsule TAKE 2 CAPSULES AT BEDTIME 180 capsule 0  . traZODone (DESYREL) 50 MG tablet TAKE 1 TABLET BY MOUTH AT BEDTIME AS NEEDED AND MAY REPEAT DOSE ONE TIME IF NEEDED FOR SLEEP. 180 tablet 1  . vortioxetine HBr (TRINTELLIX) 20 MG TABS tablet Take 1 tablet (20 mg total) by mouth daily. 30 tablet 2   No current facility-administered medications on file prior to visit.    Allergies  Allergen Reactions  . Hydrocodone-Acetaminophen Shortness Of Breath  . Penicillins Other (See Comments)    Did it involve swelling of the face/tongue/throat, SOB, or low BP? N/A Did it involve sudden or severe rash/hives, skin peeling, or any reaction on the inside of your mouth or nose? N/A Did you need to seek medical attention at a hospital or doctor's office? N/A When did it last happen?Child  If all above answers are "NO", may proceed with cephalosporin use.    Family History  Problem Relation Age of Onset  . Hyperlipidemia Mother   . Depression Mother   . Heart disease Father   . Stroke Other        parent  . Diabetes Other        grandparent /parent  . Obesity Other   . Heart attack Other     BP (!) 142/76   Pulse 90   Ht 5\' 10"  (1.778 m)   Wt 275 lb (124.7 kg)   SpO2 94%   BMI 39.46 kg/m    Review of Systems     Objective:    Physical Exam VITAL SIGNS:  See vs page GENERAL: no distress Pulses: dorsalis pedis intact bilat.   MSK: no deformity of the feet, except right  toes 1-3 toes are absent.  CV: trace bilat leg edema Skin:  no ulcer on the feet.  normal color and temp on the feet.  Neuro: sensation is intact to touch on the feet.   Lab Results  Component Value Date   HGBA1C 6.9 (A) 04/10/2020   Lab Results  Component Value Date   CREATININE 0.93 02/18/2020   BUN 16 02/18/2020   NA 142 02/18/2020   K 4.1 02/18/2020   CL 110 02/18/2020   CO2 23 02/18/2020        Assessment & Plan:  Type 2 DM: well-controlled Hypoglycemia, due to glimepiride Nausea: he can't take GLP, at least for now.  Patient Instructions  I have sent a prescription to your pharmacy, to change glimepiride to repaglinide.   Please continue the same other diabetes medications.   check your blood sugar once a day.  vary the time of day when you check, between before the 3 meals, and at bedtime.  also check if you have symptoms of your blood sugar being too high or too low.  please keep a record of the readings and bring it to your next appointment here (or you can bring the meter itself).  You can write it on any piece of paper.  please call us sooner if your blood sugar goes below 70, or if you have a lot of readings over 200. Please come back for a follow-up appointment in 3 months.

## 2020-04-10 NOTE — Patient Instructions (Addendum)
I have sent a prescription to your pharmacy, to change glimepiride to repaglinide.   Please continue the same other diabetes medications.   check your blood sugar once a day.  vary the time of day when you check, between before the 3 meals, and at bedtime.  also check if you have symptoms of your blood sugar being too high or too low.  please keep a record of the readings and bring it to your next appointment here (or you can bring the meter itself).  You can write it on any piece of paper.  please call us sooner if your blood sugar goes below 70, or if you have a lot of readings over 200. Please come back for a follow-up appointment in 3 months.

## 2020-05-01 ENCOUNTER — Telehealth (INDEPENDENT_AMBULATORY_CARE_PROVIDER_SITE_OTHER): Payer: BC Managed Care – PPO | Admitting: Psychiatry

## 2020-05-01 ENCOUNTER — Other Ambulatory Visit: Payer: Self-pay

## 2020-05-01 ENCOUNTER — Other Ambulatory Visit: Payer: Self-pay | Admitting: Internal Medicine

## 2020-05-01 DIAGNOSIS — F411 Generalized anxiety disorder: Secondary | ICD-10-CM

## 2020-05-01 DIAGNOSIS — F330 Major depressive disorder, recurrent, mild: Secondary | ICD-10-CM
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
## 2020-05-08 ENCOUNTER — Encounter: Payer: BC Managed Care – PPO | Attending: Endocrinology | Admitting: Dietician

## 2020-05-08 ENCOUNTER — Encounter: Payer: Self-pay | Admitting: Dietician

## 2020-05-08 ENCOUNTER — Other Ambulatory Visit: Payer: Self-pay

## 2020-05-08 DIAGNOSIS — E1159 Type 2 diabetes mellitus with other circulatory complications: Secondary | ICD-10-CM | POA: Insufficient documentation

## 2020-05-08 NOTE — Progress Notes (Signed)
Diabetes Self-Management Education  Visit Type: First/Initial  05/08/2020  [name], identified by name and date of birth, is a 49 y.o. male with a diagnosis of Diabetes: Type 2.   ASSESSMENT  Patient states he has had diabetes for a few years now and that he has been able to maintain a good A1c. Primary concern today is weight management. 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 Usually does not eat "meals" at work because he feels "slowed down" after eating. Typical meal pattern is 1 meal before work, snacking during work, and 1 meal after work. When at work, snacks primarily include soda, zero-sugar soda, and candy. When at home, snacks primarily include Cheez-Its, pretzels, and chips. States he tries to avoid starchy foods such as potatoes and rice. States he has tried to cut back on his snacks. Reports he does not eat out often. Reports he does not sleep very well because of constant brain activity and lots of moving around in his sleep. 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    Diabetes Self-Management Education - 05/08/20 0732      Visit Information   Visit Type First/Initial      Initial Visit   Diabetes Type Type 2    Are you currently following a meal plan? No    Are you taking your medications as prescribed? Yes    Date Diagnosed February 2012      Health Coping   How would you rate your overall health? Good      Psychosocial Assessment   Patient Belief/Attitude about Diabetes Motivated to manage diabetes    Self-care barriers None    Self-management support Doctor's office    Patient Concerns Weight Control    Special Needs None    Preferred Learning Style No preference indicated    Learning Readiness Ready    How often do you  need to have someone help you when you read instructions, pamphlets, or other written materials from your doctor or pharmacy? 1 - Never    What is the last grade level you completed in school? BA      Complications   Last HgB A1C per patient/outside source 6.9 %   04/10/20   How often do you check your blood sugar? 0 times/day (not testing)    Have you had a dilated eye exam in the past 12 months? Yes    Have you had a dental exam in the past 12 months? No    Are you checking your feet? Yes    How many days per week are you checking your feet? 7      Dietary Intake   Breakfast Grain Berry cereal   or 2 sandwiches (toast + fried egg + bacon + cheese)   Snack (morning) candy    Snack (afternoon) Cheez Its   or pretzel chips   Dinner [employer] chicken + rice    Beverage(s) [employer], water, milk      Exercise   Exercise Type Light (walking / raking leaves);Other (comment)   lots of walking/physical labor at work Conservator, museum/gallery     Patient Education   Previous Diabetes Education No    Disease state  Definition of diabetes, type 1  and 2, and the diagnosis of diabetes;Explored patient's options for treatment of their diabetes;Factors that contribute to the development of diabetes    Nutrition management  Role of diet in the treatment of diabetes and the relationship between the three main macronutrients and blood glucose level;Food label reading, portion sizes and measuring food.;Information on hints to eating out and maintain blood glucose control.;Meal options for control of blood glucose level and chronic complications.    Physical activity and exercise  Role of exercise on diabetes management, blood pressure control and cardiac health.;Helped patient identify appropriate exercises in relation to his/her diabetes, diabetes complications and other health issue.    Psychosocial adjustment Role of stress on diabetes      Individualized Goals (developed by patient)   Nutrition General  guidelines for healthy choices and portions discussed      Outcomes   Expected Outcomes Demonstrated interest in learning. Expect positive outcomes    Future DMSE PRN           Individualized Plan for Diabetes Self-Management Training:  Learning Objective:  Patient will have a greater understanding of diabetes self-management. Patient education plan is to attend individual and/or group sessions per assessed needs and concerns.   Plan:  Patient Instructions   Aim to increase water intake and decrease sugar-sweetened beverage (soda) intake.   Try replacing candy with other nutrient-dense snacks that include both carbohydrates (for energy) and protein (for balance.) See "Balanced Snacks" handout for ideas.    Expected Outcomes:  Demonstrated interest in learning. Expect positive outcomes  Education material provided: Diabetes Friendly MyPlate, Meal Ideas, Breakfast Ideas, Balanced Snacks  If problems or questions, patient to contact team via:  Phone and Email  Future DSME appointment: PRN

## 2020-05-08 NOTE — Patient Instructions (Signed)
   Aim to increase water intake and decrease sugar-sweetened beverage (soda) intake.   Try replacing candy with other nutrient-dense snacks that include both carbohydrates (for energy) and protein (for balance.) See "Balanced Snacks" handout for ideas.

## 2020-05-15 ENCOUNTER — Other Ambulatory Visit: Payer: Self-pay | Admitting: Endocrinology

## 2020-06-14 ENCOUNTER — Other Ambulatory Visit (HOSPITAL_COMMUNITY): Payer: Self-pay | Admitting: Psychiatry

## 2020-06-24 ENCOUNTER — Telehealth (INDEPENDENT_AMBULATORY_CARE_PROVIDER_SITE_OTHER): Payer: BC Managed Care – PPO | Admitting: Psychiatry

## 2020-06-24 ENCOUNTER — Other Ambulatory Visit: Payer: Self-pay

## 2020-06-24 DIAGNOSIS — F411 Generalized anxiety disorder: Secondary | ICD-10-CM

## 2020-06-24 DIAGNOSIS — E1159 Type 2 diabetes mellitus with other circulatory complications: Secondary | ICD-10-CM

## 2020-06-24 DIAGNOSIS — F331 Major depressive disorder, recurrent, moderate: Secondary | ICD-10-CM

## 2020-06-24 MED ORDER — VORTIOXETINE HBR 20 MG PO TABS
20.0000 mg | ORAL_TABLET | Freq: Every day | ORAL | 2 refills | Status: DC
Start: 2020-06-30 — End: 2020-08-11

## 2020-06-24 MED ORDER — PRAZOSIN HCL 5 MG PO CAPS: 5.0000 mg | ORAL_CAPSULE | Freq: Every day | ORAL | 0 refills | Status: DC

## 2020-06-24 MED ORDER — GABAPENTIN 300 MG PO CAPS: 300.0000 mg | ORAL_CAPSULE | Freq: Three times a day (TID) | ORAL | 2 refills | Status: DC

## 2020-06-24 MED ORDER — LAMOTRIGINE 200 MG PO TABS: 200.0000 mg | ORAL_TABLET | Freq: Every evening | ORAL | 0 refills | Status: DC

## 2020-06-24 MED ORDER — ARIPIPRAZOLE 2 MG PO TABS
2.0000 mg | ORAL_TABLET | Freq: Every day | ORAL | 1 refills | Status: DC
Start: 2020-06-24 — End: 2020-08-11

## 2020-06-24 MED ORDER — LORAZEPAM 0.5 MG PO TABS: 0.5000 mg | ORAL_TABLET | Freq: Every day | ORAL | 2 refills | Status: DC | PRN
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
## 2020-07-10 ENCOUNTER — Ambulatory Visit: Payer: BC Managed Care – PPO | Admitting: Endocrinology

## 2020-07-22 ENCOUNTER — Telehealth (HOSPITAL_COMMUNITY): Payer: Self-pay | Admitting: *Deleted

## 2020-07-22 NOTE — Telephone Encounter (Signed)
PA for Trintellix 20mg  submitted to CoverMyMeds.awaiting determination. Key # is [id number].

## 2020-07-23 ENCOUNTER — Other Ambulatory Visit (HOSPITAL_COMMUNITY): Payer: Self-pay | Admitting: Psychiatry

## 2020-08-06 ENCOUNTER — Other Ambulatory Visit: Payer: Self-pay

## 2020-08-06 DIAGNOSIS — Z20822 Contact with and (suspected) exposure to covid-19: Secondary | ICD-10-CM

## 2020-08-07 ENCOUNTER — Encounter (INDEPENDENT_AMBULATORY_CARE_PROVIDER_SITE_OTHER): Payer: Self-pay

## 2020-08-07 LAB — NOVEL CORONAVIRUS, NAA: SARS-CoV-2, NAA: NOT DETECTED

## 2020-08-07 LAB — SARS-COV-2, NAA 2 DAY TAT

## 2020-08-11 ENCOUNTER — Other Ambulatory Visit: Payer: Self-pay

## 2020-08-11 ENCOUNTER — Other Ambulatory Visit (HOSPITAL_COMMUNITY): Payer: Self-pay | Admitting: Psychiatry

## 2020-08-11 ENCOUNTER — Telehealth (INDEPENDENT_AMBULATORY_CARE_PROVIDER_SITE_OTHER): Payer: 59 | Admitting: Psychiatry

## 2020-08-11 DIAGNOSIS — F411 Generalized anxiety disorder: Secondary | ICD-10-CM

## 2020-08-11 DIAGNOSIS — F330 Major depressive disorder, recurrent, mild: Secondary | ICD-10-CM | POA: Diagnosis not present

## 2020-08-11 MED ORDER — LAMOTRIGINE 200 MG PO TABS: 200.0000 mg | ORAL_TABLET | Freq: Every evening | ORAL | 0 refills | Status: DC

## 2020-08-11 MED ORDER — PRAZOSIN HCL 5 MG PO CAPS: 5.0000 mg | ORAL_CAPSULE | Freq: Every day | ORAL | 0 refills | Status: DC

## 2020-08-11 MED ORDER — VORTIOXETINE HBR 20 MG PO TABS: 20.0000 mg | ORAL_TABLET | Freq: Every day | ORAL | 2 refills | Status: AC

## 2020-08-11 MED ORDER — HYDROXYZINE HCL 50 MG PO TABS: 50.0000 mg | ORAL_TABLET | Freq: Two times a day (BID) | ORAL | 2 refills | Status: DC | PRN
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
## 2020-08-20 ENCOUNTER — Inpatient Hospital Stay: Payer: Self-pay | Attending: Hematology and Oncology | Admitting: Hematology and Oncology

## 2020-08-20 ENCOUNTER — Other Ambulatory Visit: Payer: Self-pay | Admitting: Hematology and Oncology

## 2020-08-20 ENCOUNTER — Inpatient Hospital Stay: Payer: Self-pay

## 2020-08-20 DIAGNOSIS — D689 Coagulation defect, unspecified: Secondary | ICD-10-CM

## 2020-08-25 ENCOUNTER — Other Ambulatory Visit (HOSPITAL_COMMUNITY): Payer: Self-pay | Admitting: Psychiatry

## 2020-08-28 ENCOUNTER — Telehealth (HOSPITAL_COMMUNITY): Payer: Self-pay

## 2020-08-28 NOTE — Telephone Encounter (Signed)
I faxed a [facility] coverage determination request form for this patient's Trintellix 20mg  tablet w/the most recent ov notes. I received a fax back stating that this patient termed with [facility] on 08/01/2020 and has no other active coverage with [employer]. I then called the patient to see if he has active updated insurance. He didn't pick up and it went to VM. I couldn't LVM msg due to mailbox full. I called his pharmacy ([facility]) after that and the pharmacist stated that they've been using a Good RX card. The patient has been picking up his medications that way

## 2020-09-12 ENCOUNTER — Other Ambulatory Visit: Payer: Self-pay

## 2020-09-16 ENCOUNTER — Ambulatory Visit: Payer: 59 | Admitting: Endocrinology

## 2020-09-29 ENCOUNTER — Other Ambulatory Visit: Payer: Self-pay

## 2020-09-29 ENCOUNTER — Other Ambulatory Visit (HOSPITAL_COMMUNITY): Payer: Self-pay | Admitting: Psychiatry

## 2020-09-29 ENCOUNTER — Telehealth (INDEPENDENT_AMBULATORY_CARE_PROVIDER_SITE_OTHER): Payer: 59 | Admitting: Psychiatry

## 2020-09-29 DIAGNOSIS — F330 Major depressive disorder, recurrent, mild: Secondary | ICD-10-CM | POA: Diagnosis not present

## 2020-09-29 DIAGNOSIS — F411 Generalized anxiety disorder: Secondary | ICD-10-CM

## 2020-09-29 MED ORDER — PRAZOSIN HCL 5 MG PO CAPS: 5.0000 mg | ORAL_CAPSULE | Freq: Every day | ORAL | 0 refills | Status: DC

## 2020-09-29 MED ORDER — BUPROPION HCL ER (XL) 150 MG PO TB24: 450.0000 mg | ORAL_TABLET | Freq: Every day | ORAL | 2 refills | Status: DC

## 2020-09-29 MED ORDER — LORAZEPAM 0.5 MG PO TABS: 0.5000 mg | ORAL_TABLET | Freq: Every day | ORAL | 2 refills | Status: AC | PRN
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
## 2020-10-15 ENCOUNTER — Other Ambulatory Visit: Payer: Self-pay | Admitting: Endocrinology

## 2020-10-15 ENCOUNTER — Other Ambulatory Visit: Payer: Self-pay

## 2020-10-15 ENCOUNTER — Ambulatory Visit (INDEPENDENT_AMBULATORY_CARE_PROVIDER_SITE_OTHER): Payer: 59 | Admitting: Endocrinology

## 2020-10-15 VITALS — BP 110/68 | HR 89 | Ht 70.0 in | Wt 249.2 lb

## 2020-10-15 DIAGNOSIS — E1159 Type 2 diabetes mellitus with other circulatory complications: Secondary | ICD-10-CM | POA: Diagnosis not present

## 2020-10-15 LAB — BASIC METABOLIC PANEL
BUN: 14 mg/dL (ref 6–23)
CO2: 26 mEq/L (ref 19–32)
Calcium: 10 mg/dL (ref 8.4–10.5)
Chloride: 99 mEq/L (ref 96–112)
Creatinine, Ser: 0.86 mg/dL (ref 0.40–1.50)
GFR: 101.85 mL/min (ref >60.00)
Glucose, Bld: 288 mg/dL — ABNORMAL HIGH (ref 70–99)
Potassium: 4.6 mEq/L (ref 3.5–5.1)
Sodium: 135 mEq/L (ref 135–145)

## 2020-10-15 LAB — TSH: TSH: 1.31 u[IU]/mL (ref 0.35–4.50)

## 2020-10-15 LAB — POCT GLYCOSYLATED HEMOGLOBIN (HGB A1C): Hemoglobin A1C: 12.6 % — AB (ref 4.0–5.6)

## 2020-10-15 MED ORDER — REPAGLINIDE 2 MG PO TABS: 2.0000 mg | ORAL_TABLET | Freq: Three times a day (TID) | ORAL | 4 refills | Status: DC

## 2020-10-15 MED ORDER — METFORMIN HCL ER 500 MG PO TB24: 500.0000 mg | ORAL_TABLET | Freq: Every day | ORAL | 3 refills | Status: DC

## 2020-10-15 MED ORDER — CANAGLIFLOZIN 300 MG PO TABS: 300.0000 mg | ORAL_TABLET | Freq: Every day | ORAL | 3 refills | Status: DC

## 2020-10-15 NOTE — Patient Instructions (Addendum)
Blood tests are requested for you today.  We'll let you know about the results.  If this confirms the A1c, you should add "Rybelsus." check your blood sugar once a day.  vary the time of day when you check, between before the 3 meals, and at bedtime.  also check if you have symptoms of your blood sugar being too high or too low.  please keep a record of the readings and bring it to your next appointment here (or you can bring the meter itself).  You can write it on any piece of paper.  please call us sooner if your blood sugar goes below 70, or if you have a lot of readings over 200. Please come back for a follow-up appointment in 2 months.

## 2020-10-15 NOTE — Progress Notes (Signed)
Subjective:    Patient ID: Bryan Mcmillan, male    DOB: [birth year], 50 y.o.   MRN: [id number]  HPI Pt returns for f/u of DM:  DM type: 2 Dx'ed: 8841 Complications: none Therapy: 3 oral meds DKA: never Severe hypoglycemia: never.   Pancreatitis: never Other: he took insulin 2012-2015; he did not tolerate pioglitazone (edema).   Interval history: pt says he has not recently checked cbg.  pt states he feels well in general.  He does not want to add another injection to Lovenox.   Past Medical History:  Diagnosis Date  . Anxiety   . Asthma   . Depression   . Diabetes ([place])   . DVT (deep venous thrombosis), hx of recurrent 05/23/2012  . Headache(784.0)    frequent  . History of blood clot in brain, 2012 - [facility] by [facility] 05/23/2012  . History of blood clots   . History of suicidal tendencies   . Hyperlipemia 08/25/2012  . Hyperlipidemia   . Kidney disease   . Kidney stones   . Migraines   . Obesity   . Pneumonia   . Seizure ([place])   . Seizure disorder - followed by [facility] 05/23/2012  . Sleep apnea   . Tuberculosis     Past Surgical History:  Procedure Laterality Date  . BRAIN SURGERY    . filter for blood clots      Social History   Socioeconomic History  . Marital status: Divorced    Spouse name: Not on file  . Number of children: 1  . Years of education: BA  . Highest education level: Not on file  Occupational History  . Occupation: CUST SERV    Employer: [employer]  Tobacco Use  . Smoking status: Former Research scientist (life sciences)  . Smokeless tobacco: Never Used  Vaping Use  . Vaping Use: Never used  Substance and Sexual Activity  . Alcohol use: No  . Drug use: No  . Sexual activity: Never  Other Topics Concern  . 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 Worried About Charity fundraiser in the Last Year: Never true  . Ran Out of Food in the Last Year: Never true  Transportation Needs: Not on file  Physical Activity: Not on file  Stress: Not on file  Social Connections: Not on file  Intimate Partner Violence: Not on file    Current Outpatient Medications on File Prior to Visit  Medication Sig Dispense Refill  . atorvastatin (LIPITOR) 40 MG tablet TAKE 1 TABLET BY MOUTH EVERYDAY AT BEDTIME 30 tablet 0  . buPROPion (WELLBUTRIN XL) 150 MG 24 hr tablet Take 3 tablets (450 mg total) by mouth daily. 270 tablet 2  . enoxaparin (LOVENOX) 120 MG/0.8ML injection Inject 0.8 mLs (120 mg total) into the skin every 12 (twelve) hours. 48 mL 5  . hydrOXYzine (ATARAX/VISTARIL) 50 MG tablet TAKE 1 TABLET (50 MG TOTAL) BY MOUTH 2 (TWO) TIMES DAILY AS NEEDED FOR ANXIETY. 180 tablet 1  . lamoTRIgine (LAMICTAL) 200  MG tablet Take 1 tablet (200 mg total) by mouth at bedtime. 90 tablet 0  . lidocaine (LIDODERM) 5 % Place 1 patch onto the skin daily. Remove & Discard patch within 12 hours or as directed by MD 30 patch 0  . LORazepam (ATIVAN) 0.5 MG tablet Take 1 tablet (0.5 mg total) by mouth daily as needed for anxiety. 30 tablet 2  . naproxen (NAPROSYN) 500 MG tablet Take 1 tablet (500 mg total) by mouth 2 (two) times daily. 30 tablet 0  . [START ON 11/09/2020] prazosin (MINIPRESS) 5 MG capsule Take 1 capsule (5 mg total) by mouth at bedtime. 90 capsule 0  . traZODone (DESYREL) 50 MG tablet TAKE 1 TABLET BY MOUTH AT BEDTIME AS NEEDED AND MAY REPEAT DOSE ONE TIME IF NEEDED FOR SLEEP. 180 tablet 1  . vortioxetine HBr (TRINTELLIX) 20 MG TABS tablet Take 1 tablet (20 mg total) by mouth daily. 30 tablet 2   No current facility-administered medications on file prior to visit.    Allergies  Allergen Reactions   . Hydrocodone-Acetaminophen Shortness Of Breath  . Penicillins Other (See Comments)    Did it involve swelling of the face/tongue/throat, SOB, or low BP? N/A Did it involve sudden or severe rash/hives, skin peeling, or any reaction on the inside of your mouth or nose? N/A Did you need to seek medical attention at a hospital or doctor's office? N/A When did it last happen?Child  If all above answers are "NO", may proceed with cephalosporin use.    Family History  Problem Relation Age of Onset  . Hyperlipidemia Mother   . Depression Mother   . Heart disease Father   . Stroke Other        parent  . Diabetes Other        grandparent /parent  . Obesity Other   . Heart attack Other     BP 110/68 (BP Location: Right Arm, Patient Position: Sitting, Cuff Size: Large)   Pulse 89   Ht 5\' 10"  (1.778 m)   Wt 249 lb 3.2 oz (113 kg)   SpO2 97%   BMI 35.76 kg/m    Review of Systems He has lost weight--intentional.     Objective:   Physical Exam VITAL SIGNS:  See vs page GENERAL: no distress Pulses: dorsalis pedis intact bilat.   MSK: no deformity of the feet, except right toes 1-3 toes are absent.  CV: no leg edema Skin:  no ulcer on the feet.  normal color and temp on the feet.  Neuro: sensation is intact to touch on the feet.   A1c=12.6%     Assessment & Plan:  Type 2 DM: uncontrolled.  He declines insulin.  He is advised of risks.    Patient Instructions  Blood tests are requested for you today.  We'll let you know about the results.  If this confirms the A1c, you should add "Rybelsus." check your blood sugar once a day.  vary the time of day when you check, between before the 3 meals, and at bedtime.  also check if you have symptoms of your blood sugar being too high or too low.  please keep a record of the readings and bring it to your next appointment here (or you can bring the meter itself).  You can write it on any piece of paper.  please call us sooner if your blood  sugar goes below 70, or if you have a lot of readings over 200. Please come back  for a follow-up appointment in 2 months.

## 2020-10-19 ENCOUNTER — Other Ambulatory Visit: Payer: Self-pay | Admitting: Endocrinology

## 2020-10-19 LAB — FRUCTOSAMINE: Fructosamine: 482 umol/L — ABNORMAL HIGH (ref 205–285)

## 2020-10-19 MED ORDER — RYBELSUS 3 MG PO TABS: 3.0000 mg | ORAL_TABLET | Freq: Every day | ORAL | 3 refills | Status: DC

## 2020-10-30 ENCOUNTER — Other Ambulatory Visit: Payer: Self-pay | Admitting: *Deleted

## 2020-10-30 ENCOUNTER — Telehealth: Payer: Self-pay | Admitting: Hematology and Oncology

## 2020-10-30 ENCOUNTER — Encounter: Payer: Self-pay | Admitting: Hematology and Oncology

## 2020-10-30 MED ORDER — ENOXAPARIN SODIUM 120 MG/0.8ML ~~LOC~~ SOLN: 120.0000 mg | Freq: Two times a day (BID) | SUBCUTANEOUS | 5 refills | Status: DC

## 2020-10-30 NOTE — Telephone Encounter (Signed)
Scheduled appt per 3/31 sch msg. Pt aware.  

## 2020-10-31 ENCOUNTER — Emergency Department (HOSPITAL_COMMUNITY): Admission: EM | Admit: 2020-10-31 | Discharge: 2020-11-01 | Discharge status: Against Medical Advice | Payer: 59

## 2020-10-31 NOTE — ED Notes (Signed)
Pt stated he is going home, does not want to wait

## 2020-11-02 ENCOUNTER — Other Ambulatory Visit: Payer: Self-pay | Admitting: Hematology and Oncology

## 2020-11-02 DIAGNOSIS — D689 Coagulation defect, unspecified: Secondary | ICD-10-CM
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
## 2020-11-03 ENCOUNTER — Encounter: Payer: Self-pay | Admitting: Hematology and Oncology

## 2020-11-03 ENCOUNTER — Other Ambulatory Visit: Payer: Self-pay

## 2020-11-03 ENCOUNTER — Inpatient Hospital Stay: Payer: 59 | Attending: Hematology and Oncology | Admitting: Hematology and Oncology

## 2020-11-03 ENCOUNTER — Inpatient Hospital Stay: Payer: 59

## 2020-11-03 VITALS — BP 104/71 | HR 84 | Temp 97.2°F | Resp 17 | Wt 245.8 lb

## 2020-11-03 DIAGNOSIS — E785 Hyperlipidemia, unspecified: Secondary | ICD-10-CM | POA: Insufficient documentation

## 2020-11-03 DIAGNOSIS — E669 Obesity, unspecified: Secondary | ICD-10-CM | POA: Diagnosis not present

## 2020-11-03 DIAGNOSIS — Z86718 Personal history of other venous thrombosis and embolism: Secondary | ICD-10-CM

## 2020-11-03 DIAGNOSIS — Z7901 Long term (current) use of anticoagulants: Secondary | ICD-10-CM | POA: Insufficient documentation

## 2020-11-03 DIAGNOSIS — F410 Panic disorder [episodic paroxysmal anxiety] without agoraphobia: Secondary | ICD-10-CM | POA: Diagnosis not present

## 2020-11-03 DIAGNOSIS — Z87891 Personal history of nicotine dependence: Secondary | ICD-10-CM | POA: Diagnosis not present

## 2020-11-03 DIAGNOSIS — Z7984 Long term (current) use of oral hypoglycemic drugs: Secondary | ICD-10-CM | POA: Diagnosis not present

## 2020-11-03 DIAGNOSIS — Z87442 Personal history of urinary calculi: Secondary | ICD-10-CM | POA: Insufficient documentation

## 2020-11-03 DIAGNOSIS — G4733 Obstructive sleep apnea (adult) (pediatric): Secondary | ICD-10-CM | POA: Diagnosis not present

## 2020-11-03 DIAGNOSIS — D689 Coagulation defect, unspecified: Secondary | ICD-10-CM | POA: Diagnosis not present

## 2020-11-03 DIAGNOSIS — Z79899 Other long term (current) drug therapy: Secondary | ICD-10-CM | POA: Diagnosis not present

## 2020-11-03 DIAGNOSIS — E119 Type 2 diabetes mellitus without complications: Secondary | ICD-10-CM | POA: Insufficient documentation

## 2020-11-03 DIAGNOSIS — G40909 Epilepsy, unspecified, not intractable, without status epilepticus: Secondary | ICD-10-CM | POA: Insufficient documentation

## 2020-11-03 LAB — CMP (CANCER CENTER ONLY)
ALT: 15 U/L (ref 0–44)
AST: 11 U/L — ABNORMAL LOW (ref 15–41)
Albumin: 4 g/dL (ref 3.5–5.0)
Alkaline Phosphatase: 82 U/L (ref 38–126)
Anion gap: 13 (ref 5–15)
BUN: 16 mg/dL (ref 6–20)
CO2: 23 mmol/L (ref 22–32)
Calcium: 9.1 mg/dL (ref 8.9–10.3)
Chloride: 101 mmol/L (ref 98–111)
Creatinine: 1.12 mg/dL (ref 0.61–1.24)
GFR, Estimated: >60 mL/min (ref >60)
Glucose, Bld: 347 mg/dL — ABNORMAL HIGH (ref 70–99)
Potassium: 4.2 mmol/L (ref 3.5–5.1)
Sodium: 137 mmol/L (ref 135–145)
Total Bilirubin: 0.3 mg/dL (ref 0.3–1.2)
Total Protein: 7.3 g/dL (ref 6.5–8.1)

## 2020-11-03 LAB — CBC WITH DIFFERENTIAL (CANCER CENTER ONLY)
Abs Immature Granulocytes: 0.04 10*3/uL (ref 0.00–0.07)
Basophils Absolute: 0 10*3/uL (ref 0.0–0.1)
Basophils Relative: 0 %
Eosinophils Absolute: 0.1 10*3/uL (ref 0.0–0.5)
Eosinophils Relative: 1 %
HCT: 41.9 % (ref 39.0–52.0)
Hemoglobin: 13.9 g/dL (ref 13.0–17.0)
Immature Granulocytes: 1 %
Lymphocytes Relative: 19 %
Lymphs Abs: 1.5 10*3/uL (ref 0.7–4.0)
MCH: 28.3 pg (ref 26.0–34.0)
MCHC: 33.2 g/dL (ref 30.0–36.0)
MCV: 85.2 fL (ref 80.0–100.0)
Monocytes Absolute: 0.5 10*3/uL (ref 0.1–1.0)
Monocytes Relative: 6 %
Neutro Abs: 6 10*3/uL (ref 1.7–7.7)
Neutrophils Relative %: 73 %
Platelet Count: 231 10*3/uL (ref 150–400)
RBC: 4.92 MIL/uL (ref 4.22–5.81)
RDW: 12.4 % (ref 11.5–15.5)
WBC Count: 8.2 10*3/uL (ref 4.0–10.5)
nRBC: 0 % (ref 0.0–0.2)

## 2020-11-04 ENCOUNTER — Telehealth: Payer: Self-pay | Admitting: Hematology and Oncology

## 2020-11-04 NOTE — Telephone Encounter (Signed)
Scheduled per los. Called and left msg  ° °

## 2020-11-20 ENCOUNTER — Observation Stay (HOSPITAL_BASED_OUTPATIENT_CLINIC_OR_DEPARTMENT_OTHER)
Admission: EM | Admit: 2020-11-20 | Discharge: 2020-11-21 | Disposition: A | Discharge status: Home / Self Care | Payer: 59 | Attending: Internal Medicine | Admitting: Internal Medicine

## 2020-11-20 ENCOUNTER — Other Ambulatory Visit: Payer: Self-pay

## 2020-11-20 ENCOUNTER — Emergency Department (HOSPITAL_BASED_OUTPATIENT_CLINIC_OR_DEPARTMENT_OTHER): Payer: 59

## 2020-11-20 ENCOUNTER — Emergency Department (HOSPITAL_BASED_OUTPATIENT_CLINIC_OR_DEPARTMENT_OTHER): Payer: 59 | Admitting: Radiology

## 2020-11-20 ENCOUNTER — Encounter (HOSPITAL_BASED_OUTPATIENT_CLINIC_OR_DEPARTMENT_OTHER): Payer: Self-pay

## 2020-11-20 DIAGNOSIS — F411 Generalized anxiety disorder: Secondary | ICD-10-CM | POA: Diagnosis present

## 2020-11-20 DIAGNOSIS — IMO0002 Reserved for concepts with insufficient information to code with codable children: Secondary | ICD-10-CM | POA: Diagnosis present

## 2020-11-20 DIAGNOSIS — Z7984 Long term (current) use of oral hypoglycemic drugs: Secondary | ICD-10-CM | POA: Insufficient documentation

## 2020-11-20 DIAGNOSIS — J45909 Unspecified asthma, uncomplicated: Secondary | ICD-10-CM | POA: Insufficient documentation

## 2020-11-20 DIAGNOSIS — Z20822 Contact with and (suspected) exposure to covid-19: Secondary | ICD-10-CM | POA: Insufficient documentation

## 2020-11-20 DIAGNOSIS — E119 Type 2 diabetes mellitus without complications: Secondary | ICD-10-CM | POA: Diagnosis not present

## 2020-11-20 DIAGNOSIS — Z79899 Other long term (current) drug therapy: Secondary | ICD-10-CM | POA: Insufficient documentation

## 2020-11-20 DIAGNOSIS — M7989 Other specified soft tissue disorders: Secondary | ICD-10-CM | POA: Diagnosis present

## 2020-11-20 DIAGNOSIS — Z87891 Personal history of nicotine dependence: Secondary | ICD-10-CM | POA: Diagnosis not present

## 2020-11-20 DIAGNOSIS — I82432 Acute embolism and thrombosis of left popliteal vein: Secondary | ICD-10-CM

## 2020-11-20 DIAGNOSIS — F330 Major depressive disorder, recurrent, mild: Secondary | ICD-10-CM | POA: Diagnosis present

## 2020-11-20 DIAGNOSIS — I82412 Acute embolism and thrombosis of left femoral vein: Secondary | ICD-10-CM

## 2020-11-20 DIAGNOSIS — E1165 Type 2 diabetes mellitus with hyperglycemia: Secondary | ICD-10-CM | POA: Diagnosis present

## 2020-11-20 DIAGNOSIS — I82402 Acute embolism and thrombosis of unspecified deep veins of left lower extremity: Principal | ICD-10-CM | POA: Diagnosis present

## 2020-11-20 DIAGNOSIS — R079 Chest pain, unspecified: Secondary | ICD-10-CM

## 2020-11-20 LAB — CBC WITH DIFFERENTIAL/PLATELET
Abs Immature Granulocytes: 0.05 10*3/uL (ref 0.00–0.07)
Basophils Absolute: 0.1 10*3/uL (ref 0.0–0.1)
Basophils Relative: 0 %
Eosinophils Absolute: 0.1 10*3/uL (ref 0.0–0.5)
Eosinophils Relative: 1 %
HCT: 39.7 % (ref 39.0–52.0)
Hemoglobin: 13.8 g/dL (ref 13.0–17.0)
Immature Granulocytes: 0 %
Lymphocytes Relative: 17 %
Lymphs Abs: 2.1 10*3/uL (ref 0.7–4.0)
MCH: 29.1 pg (ref 26.0–34.0)
MCHC: 34.8 g/dL (ref 30.0–36.0)
MCV: 83.6 fL (ref 80.0–100.0)
Monocytes Absolute: 0.9 10*3/uL (ref 0.1–1.0)
Monocytes Relative: 7 %
Neutro Abs: 9.2 10*3/uL — ABNORMAL HIGH (ref 1.7–7.7)
Neutrophils Relative %: 75 %
Platelets: 299 10*3/uL (ref 150–400)
RBC: 4.75 MIL/uL (ref 4.22–5.81)
RDW: 12.9 % (ref 11.5–15.5)
WBC: 12.4 10*3/uL — ABNORMAL HIGH (ref 4.0–10.5)
nRBC: 0 % (ref 0.0–0.2)

## 2020-11-20 LAB — PROTIME-INR
INR: 1 (ref 0.8–1.2)
Prothrombin Time: 13.5 seconds (ref 11.4–15.2)

## 2020-11-20 LAB — TROPONIN I (HIGH SENSITIVITY): Troponin I (High Sensitivity): <2 ng/L (ref <18)

## 2020-11-20 LAB — APTT: aPTT: 29 seconds (ref 24–36)

## 2020-11-20 MED ORDER — SODIUM CHLORIDE 0.9 % IV BOLUS
1000.0000 mL | Freq: Once | INTRAVENOUS | Status: AC
  Administered 2020-11-20: 1000 mL via INTRAVENOUS

## 2020-11-20 NOTE — ED Triage Notes (Signed)
Patient here POV from Home with Leg Swelling and Leg Rash.  Patient states he has a Hx of DVT's and takes Subcutaneous Lovenox BID.  Leg Swelling began approx 1 week ago but Redness and Rash began today.  Painful, Ambulatory, Afebrile.

## 2020-11-20 NOTE — ED Provider Notes (Addendum)
[facility] EMERGENCY DEPT Provider Note  CSN: [id number] Arrival date & time: 11/20/20 2147  Chief Complaint(s) Leg Swelling  HPI Bryan Mcmillan is a 50 y.o. male here with left leg swelling and redness. Noted swelling 1 week ago, but was improving initially. Today noted that swelling significantly worsened. Has redness to anterior portion of left lower leg. Leg is tender. Report mild sharp central chest pain that started around 10:45p tonight while waiting to be seen. Has had similar pain in the past with his anxiety. No SOB. No recent fevers or infection, coughing or congestion, N/V, abd pain. Was seen by Hematology on Apr 4th - noted that he had gone with a few days w/o his Lovenox due to pharmacy communication issues. Reports that he has been taking his Lovenox as Rx'd since issue was resolved.  HPI  Past Medical History Past Medical History:  Diagnosis Date  . Anxiety   . Asthma   . Depression   . Diabetes ([place])   . DVT (deep venous thrombosis), hx of recurrent 05/23/2012  . Headache(784.0)    frequent  . History of blood clot in brain, 2012 - followed by [facility] 05/23/2012  . History of blood clots   . History of suicidal tendencies   . Hyperlipemia 08/25/2012  . Hyperlipidemia   . Kidney disease   . Kidney stones   . Migraines   . Obesity   . Pneumonia   . Seizure ([place])   . Seizure disorder - followed by [facility] 05/23/2012  . Sleep apnea   . Tuberculosis    Patient Active Problem List   Diagnosis Date Noted  . Acute deep vein thrombosis (DVT) of left lower extremity ([place]) 11/21/2020  . GAD (generalized anxiety disorder) 01/28/2020  . Subcutaneous nodule 07/09/2015  . Chest pain due to psychological stress 11/21/2013  . Major depressive disorder, recurrent episode, mild ([place]) 09/24/2013  . Speech abnormality 07/11/2013  . Major depression, recurrent ([place]) 03/20/2013  . Brain tumor, glioma ([place]) 11/08/2012  .  Hyperlipemia 08/25/2012  . Migraine -followed by [facility] 08/25/2012  . Diabetes type 2, controlled ([place]) 08/25/2012  . DVT (deep venous thrombosis), hx of recurrent 05/23/2012  . Pulmonary embolism ([place]) 05/23/2012  . Seizure disorder - followed by [facility] 05/23/2012   Home Medication(s) Prior to Admission medications   Medication Sig Start Date End Date Taking? Authorizing Provider  atorvastatin (LIPITOR) 40 MG tablet TAKE 1 TABLET BY MOUTH EVERYDAY AT BEDTIME 05/01/20   [name], Rayford Halsted, MD  buPROPion (WELLBUTRIN XL) 150 MG 24 hr tablet Take 3 tablets (450 mg total) by mouth daily. 09/29/20 12/28/20  Pucilowski, Marchia Bond, MD  canagliflozin (INVOKANA) 300 MG TABS tablet Take 1 tablet (300 mg total) by mouth daily. 10/15/20   Renato Shin, MD  enoxaparin (LOVENOX) 120 MG/0.8ML injection Inject 0.8 mLs (120 mg total) into the skin every 12 (twelve) hours. 10/30/20   Orson Slick, MD  hydrOXYzine (ATARAX/VISTARIL) 50 MG tablet TAKE 1 TABLET (50 MG TOTAL) BY MOUTH 2 (TWO) TIMES DAILY AS NEEDED FOR ANXIETY. 08/25/20 11/23/20  Pucilowski, Marchia Bond, MD  lamoTRIgine (LAMICTAL) 200 MG tablet Take 1 tablet (200 mg total) by mouth at bedtime. 09/29/20 12/28/20  Pucilowski, Olgierd A, MD  lidocaine (LIDODERM) 5 % Place 1 patch onto the skin daily. Remove & Discard patch within 12 hours or as directed by MD 02/18/20   Henderly, Britni A, PA-C  LORazepam (ATIVAN) 0.5 MG tablet Take 1 tablet (0.5  mg total) by mouth daily as needed for anxiety. 09/29/20 12/28/20  Pucilowski, Marchia Bond, MD  metFORMIN (GLUCOPHAGE-XR) 500 MG 24 hr tablet Take 1 tablet (500 mg total) by mouth daily. 10/15/20   Renato Shin, MD  naproxen (NAPROSYN) 500 MG tablet Take 1 tablet (500 mg total) by mouth 2 (two) times daily. 02/18/20   Henderly, Britni A, PA-C  prazosin (MINIPRESS) 5 MG capsule Take 1 capsule (5 mg total) by mouth at bedtime. 11/09/20 02/07/21  Pucilowski, Marchia Bond, MD  repaglinide (PRANDIN) 2 MG tablet  Take 1 tablet (2 mg total) by mouth 3 (three) times daily before meals. 10/15/20   Renato Shin, MD  Semaglutide (RYBELSUS) 3 MG TABS Take 3 mg by mouth daily. 10/19/20   Renato Shin, MD  traZODone (DESYREL) 50 MG tablet TAKE 1 TABLET BY MOUTH AT BEDTIME AS NEEDED AND MAY REPEAT DOSE ONE TIME IF NEEDED FOR SLEEP. 02/26/20   Pucilowski, Marchia Bond, MD  vortioxetine HBr (TRINTELLIX) 20 MG TABS tablet Take 1 tablet (20 mg total) by mouth daily. 09/29/20 12/28/20  Pucilowski, Marchia Bond, MD                                                                                                                                    Past Surgical History Past Surgical History:  Procedure Laterality Date  . BRAIN SURGERY    . filter for blood clots    . TOE AMPUTATION Right 2012   Family History Family History  Problem Relation Age of Onset  . Hyperlipidemia Mother   . Depression Mother   . Heart disease Father   . Stroke Other        parent  . Diabetes Other        grandparent /parent  . Obesity Other   . Heart attack Other     Social History Social History   Tobacco Use  . Smoking status: Former Research scientist (life sciences)  . Smokeless tobacco: Never Used  Vaping Use  . Vaping Use: Never used  Substance Use Topics  . Alcohol use: No  . Drug use: No   Allergies Hydrocodone-acetaminophen and Penicillins  Review of Systems Review of Systems All other systems are reviewed and are negative for acute change except as noted in the HPI  Physical Exam Vital Signs  I have reviewed the triage vital signs BP 129/80 (BP Location: Left Arm)   Pulse 78   Temp 99.2 F (37.3 C) (Oral)   Resp 16   Ht 5\' 11"  (1.803 m)   Wt 111.1 kg   SpO2 95%   BMI 34.17 kg/m   Physical Exam Vitals reviewed.  Constitutional:      General: He is not in acute distress.    Appearance: He is well-developed. He is not diaphoretic.  HENT:     Head: Normocephalic and atraumatic.     Nose: Nose normal.  Eyes:     General:  No scleral  icterus.       Right eye: No discharge.        Left eye: No discharge.     Conjunctiva/sclera: Conjunctivae normal.     Pupils: Pupils are equal, round, and reactive to light.  Cardiovascular:     Rate and Rhythm: Normal rate and regular rhythm.     Heart sounds: No murmur heard. No friction rub. No gallop.   Pulmonary:     Effort: Pulmonary effort is normal. No respiratory distress.     Breath sounds: Normal breath sounds. No stridor. No rales.  Abdominal:     General: There is no distension.     Palpations: Abdomen is soft.     Tenderness: There is no abdominal tenderness.  Musculoskeletal:     Cervical back: Normal range of motion and neck supple.     Left lower leg: Swelling and tenderness present.       Legs:     Comments: See image  Skin:    General: Skin is warm and dry.     Findings: No erythema or rash.  Neurological:     Mental Status: He is alert and oriented to person, place, and time.       ED Results and Treatments Labs (all labs ordered are listed, but only abnormal results are displayed) Labs Reviewed  CBC WITH DIFFERENTIAL/PLATELET - Abnormal; Notable for the following components:      Result Value   WBC 12.4 (*)    Neutro Abs 9.2 (*)    All other components within normal limits  COMPREHENSIVE METABOLIC PANEL - Abnormal; Notable for the following components:   Glucose, Bld 148 (*)    ALT 53 (*)    All other components within normal limits  RESP PANEL BY RT-PCR (FLU A&B, COVID) [id number]  PROTIME-INR  APTT  TROPONIN I (HIGH SENSITIVITY)                                                                                                                         EKG  EKG Interpretation  Date/Time:  Thursday November 20 2020 23:03:17 EDT Ventricular Rate:  88 PR Interval:  189 QRS Duration: 87 QT Interval:  362 QTC Calculation: 438 R Axis:   58 Text Interpretation: Sinus rhythm Borderline low voltage, extremity leads No significant change since last  tracing Confirmed by Addison Lank [phone]) on 11/20/2020 11:09:03 PM      Radiology DG Chest 2 View  Result Date: 11/21/2020 CLINICAL DATA:  Leg swelling and rash, history of DVT EXAM: CHEST - 2 VIEW COMPARISON:  Radiograph 06/04/2019, CT 04/07/2013 FINDINGS: No consolidation, features of edema, pneumothorax, or effusion. Pulmonary vascularity is normally distributed. The cardiomediastinal contours are unremarkable. No acute osseous or soft tissue abnormality. Telemetry leads overlie the chest. IMPRESSION: No acute cardiopulmonary abnormality. Electronically Signed   By: Lovena Le M.D.   On: 11/21/2020 00:06   US Venous Img Lower Unilateral Left  Result Date: 11/21/2020 CLINICAL  DATA:  Leg swelling EXAM: LEFT LOWER EXTREMITY VENOUS DOPPLER ULTRASOUND TECHNIQUE: Gray-scale sonography with graded compression, as well as color Doppler and duplex ultrasound were performed to evaluate the lower extremity deep venous systems from the level of the common femoral vein and including the common femoral, femoral, profunda femoral, popliteal and calf veins including the posterior tibial, peroneal and gastrocnemius veins when visible. The superficial great saphenous vein was also interrogated. Spectral Doppler was utilized to evaluate flow at rest and with distal augmentation maneuvers in the common femoral, femoral and popliteal veins. COMPARISON:  None. FINDINGS: Contralateral Common Femoral Vein: Respiratory phasicity is normal and symmetric with the symptomatic side. No evidence of thrombus. Normal compressibility. Common Femoral Vein: Thrombus is noted with decreased compressibility. Saphenofemoral Junction: No evidence of thrombus. Normal compressibility and flow on color Doppler imaging. Profunda Femoral Vein: No evidence of thrombus. Normal compressibility and flow on color Doppler imaging. Femoral Vein: Thrombus is noted with decreased compressibility. Popliteal Vein: Thrombus is noted with decreased  compressibility. Calf Veins: No evidence of thrombus. Normal compressibility and flow on color Doppler imaging. Superficial Great Saphenous Vein: No evidence of thrombus. Normal compressibility. Venous Reflux:  None. Other Findings:  None. IMPRESSION: Left lower extremity deep venous thrombosis is noted involving the common femoral, superficial femoral and popliteal veins. Electronically Signed   By: Inez Catalina M.D.   On: 11/21/2020 00:53    Pertinent labs & imaging results that were available during my care of the patient were reviewed by me and considered in my medical decision making (see chart for details).  Medications Ordered in ED Medications  heparin ADULT infusion 100 units/mL (25000 units/278mL) (1,800 Units/hr Intravenous New Bag/Given 11/21/20 0141)  sodium chloride 0.9 % bolus 1,000 mL (0 mLs Intravenous Stopped 11/21/20 0118)  heparin bolus via infusion 5,000 Units (5,000 Units Intravenous Bolus from Bag 11/21/20 0141)                                                                                                                                    Procedures .Critical Care Performed by: Fatima Blank, MD Authorized by: Fatima Blank, MD   Critical care provider statement:    Critical care time (minutes):  45   Critical care was time spent personally by me on the following activities:  Discussions with consultants, evaluation of patient's response to treatment, examination of patient, ordering and performing treatments and interventions, ordering and review of laboratory studies, ordering and review of radiographic studies, pulse oximetry, re-evaluation of patient's condition, obtaining history from patient or surrogate and review of old charts   Care discussed with: admitting provider and accepting provider at another facility      (including critical care time)  Medical Decision Making / ED Course I have reviewed the nursing notes for this encounter and the  patient's prior records (if available in EHR or on provided paperwork).   Bryan Mcmillan  was evaluated in Emergency Department on 11/21/2020 for the symptoms described in the history of present illness. He was evaluated in the context of the global COVID-19 pandemic, which necessitated consideration that the patient might be at risk for infection with the SARS-CoV-2 virus that causes COVID-19. Institutional protocols and algorithms that pertain to the evaluation of patients at risk for COVID-19 are in a state of rapid change based on information released by regulatory bodies including the CDC and federal and state organizations. These policies and algorithms were followed during the patient's care in the ED.  Leg swelling More concerned with recurring DVT. Has h/o recurring clots while on coumadin and Xarelto in the past. Has IVC filter in place. Less likely cellulitis. Will get labs and [place].  Chest pain. Mild sharp. Possible PE, though I feel this is likely related to anxiety as he describes it. EKG w/o acute ischemic changes or evidence of pericarditis. Doubt ACS. Trop negative.   [place] + for DVT to common and superficial femoral as well as popliteal veins. He will need to be admitted and started on Hep gtt. He will need hemotology consultation during admission to determine next step in management of his recurring clots.  Will discuss case with [facility] to see if thrombectomy will be possible.  2:41 AM Spoke with Dr. Trula Slade who will consult during admission.       Final Clinical Impression(s) / ED Diagnoses Final diagnoses:  Chest pain  Acute deep vein thrombosis (DVT) of femoral vein of left lower extremity (HCC)  Acute deep vein thrombosis (DVT) of popliteal vein of left lower extremity ([place])      This chart was dictated using voice recognition software.  Despite best efforts to proofread,  errors can occur which can change the documentation meaning.   Fatima Blank, MD 11/21/20 0320    Fatima Blank, MD 12/03/20 [phone]

## 2020-11-20 NOTE — ED Provider Notes (Incomplete)
Bryan Mcmillan  CSN: [id number] Arrival date & time: 11/20/20 2147  Chief Complaint(s) Leg Swelling  HPI Bryan Mcmillan Mcmillan is a 50 y.o. male here with left leg swelling and redness. Noted swelling 1 week ago, but was improving initially. Today noted that swelling significantly worsened. Has redness to anterior portion of left lower leg. Leg is tender. Report mild sharp central chest pain that started around 10:45p tonight while waiting to be seen. Has had similar pain in the past. No SOB. No recent fevers or infection, coughing or congestion, N/V, abd pain. Was seen by Hematology on Apr 4th - noted that he had gone with a few days w/o his Lovenox due to pharmacy communication issues. Reports that he has been taking his Lovenox as Rx'd since issue was resolved.  HPI  Past Medical History Past Medical History:  Diagnosis Date  . Anxiety   . Asthma   . Depression   . Diabetes ([place])   . DVT (deep venous thrombosis), hx of recurrent 05/23/2012  . Headache(784.0)    frequent  . History of blood clot in brain, 2012 - followed by [facility] 05/23/2012  . History of blood clots   . History of suicidal tendencies   . Hyperlipemia 08/25/2012  . Hyperlipidemia   . Kidney disease   . Kidney stones   . Migraines   . Obesity   . Pneumonia   . Seizure ([place])   . Seizure disorder - followed by [place] [facility] 05/23/2012  . Sleep apnea   . Tuberculosis    Patient Active Problem List   Diagnosis Date Noted  . GAD (generalized anxiety disorder) 01/28/2020  . Subcutaneous nodule 07/09/2015  . Chest pain due to psychological stress 11/21/2013  . Major depressive disorder, recurrent episode, mild ([name]) 09/24/2013  . Speech abnormality 07/11/2013  . Major depression, recurrent ([name]) 03/20/2013  . Brain tumor, glioma ([name]) 11/08/2012  . Hyperlipemia 08/25/2012  . Migraine -followed by [facility] 08/25/2012  . Diabetes type 2,  controlled ([name]) 08/25/2012  . DVT (deep venous thrombosis), hx of recurrent 05/23/2012  . Pulmonary embolism ([name]) 05/23/2012  . Seizure disorder - followed by [facility] 05/23/2012   Home Medication(s) Prior to Admission medications   Medication Sig Start Date End Date Taking? Authorizing Provider  atorvastatin (LIPITOR) 40 MG tablet TAKE 1 TABLET BY MOUTH EVERYDAY AT BEDTIME 05/01/20   [name], Rayford Halsted, MD  buPROPion (WELLBUTRIN XL) 150 MG 24 hr tablet Take 3 tablets (450 mg total) by mouth daily. 09/29/20 12/28/20  Pucilowski, Marchia Bond, MD  canagliflozin (INVOKANA) 300 MG TABS tablet Take 1 tablet (300 mg total) by mouth daily. 10/15/20   Renato Shin, MD  enoxaparin (LOVENOX) 120 MG/0.8ML injection Inject 0.8 mLs (120 mg total) into the skin every 12 (twelve) hours. 10/30/20   Orson Slick, MD  hydrOXYzine (ATARAX/VISTARIL) 50 MG tablet TAKE 1 TABLET (50 MG TOTAL) BY MOUTH 2 (TWO) TIMES DAILY AS NEEDED FOR ANXIETY. 08/25/20 11/23/20  Pucilowski, Marchia Bond, MD  lamoTRIgine (LAMICTAL) 200 MG tablet Take 1 tablet (200 mg total) by mouth at bedtime. 09/29/20 12/28/20  Pucilowski, Olgierd A, MD  lidocaine (LIDODERM) 5 % Place 1 patch onto the skin daily. Remove & Discard patch within 12 hours or as directed by MD 02/18/20   Henderly, Britni A, PA-C  LORazepam (ATIVAN) 0.5 MG tablet Take 1 tablet (0.5 mg total) by mouth daily as needed for anxiety. 09/29/20 12/28/20  Pucilowski, Marchia Bond, MD  metFORMIN (GLUCOPHAGE-XR) 500 MG 24 hr tablet Take 1 tablet (500 mg total) by mouth daily. 10/15/20   Renato Shin, MD  naproxen (NAPROSYN) 500 MG tablet Take 1 tablet (500 mg total) by mouth 2 (two) times daily. 02/18/20   Henderly, Britni A, PA-C  prazosin (MINIPRESS) 5 MG capsule Take 1 capsule (5 mg total) by mouth at bedtime. 11/09/20 02/07/21  Pucilowski, Marchia Bond, MD  repaglinide (PRANDIN) 2 MG tablet Take 1 tablet (2 mg total) by mouth 3 (three) times daily before meals. 10/15/20   Renato Shin,  MD  Semaglutide (RYBELSUS) 3 MG TABS Take 3 mg by mouth daily. 10/19/20   Renato Shin, MD  traZODone (DESYREL) 50 MG tablet TAKE 1 TABLET BY MOUTH AT BEDTIME AS NEEDED AND MAY REPEAT DOSE ONE TIME IF NEEDED FOR SLEEP. 02/26/20   Pucilowski, Marchia Bond, MD  vortioxetine HBr (TRINTELLIX) 20 MG TABS tablet Take 1 tablet (20 mg total) by mouth daily. 09/29/20 12/28/20  Pucilowski, Marchia Bond, MD                                                                                                                                    Past Surgical History Past Surgical History:  Procedure Laterality Date  . BRAIN SURGERY    . filter for blood clots    . TOE AMPUTATION Right 2012   Family History Family History  Problem Relation Age of Onset  . Hyperlipidemia Mother   . Depression Mother   . Heart disease Father   . Stroke Other        parent  . Diabetes Other        grandparent /parent  . Obesity Other   . Heart attack Other     Social History Social History   Tobacco Use  . Smoking status: Former Research scientist (life sciences)  . Smokeless tobacco: Never Used  Vaping Use  . Vaping Use: Never used  Substance Use Topics  . Alcohol use: No  . Drug use: No   Allergies Hydrocodone-acetaminophen and Penicillins  Review of Systems Review of Systems All other systems are reviewed and are negative for acute change except as noted in the HPI  Physical Exam Vital Signs  I have reviewed the triage vital signs BP 129/80 (BP Location: Left Arm)   Pulse 78   Temp 99.2 F (37.3 C) (Oral)   Resp 16   Ht 5\' 11"  (1.803 m)   Wt 111.1 kg   SpO2 95%   BMI 34.17 kg/m   Physical Exam Vitals reviewed.  Constitutional:      General: He is not in acute distress.    Appearance: He is well-developed. He is not diaphoretic.  HENT:     Head: Normocephalic and atraumatic.     Nose: Nose normal.  Eyes:     General: No scleral icterus.       Right eye: No discharge.  Left eye: No discharge.     Conjunctiva/sclera:  Conjunctivae normal.     Pupils: Pupils are equal, round, and reactive to light.  Cardiovascular:     Rate and Rhythm: Normal rate and regular rhythm.     Heart sounds: No murmur heard. No friction rub. No gallop.   Pulmonary:     Effort: Pulmonary effort is normal. No respiratory distress.     Breath sounds: Normal breath sounds. No stridor. No rales.  Abdominal:     General: There is no distension.     Palpations: Abdomen is soft.     Tenderness: There is no abdominal tenderness.  Musculoskeletal:     Cervical back: Normal range of motion and neck supple.     Left lower leg: Swelling and tenderness present.       Legs:  Skin:    General: Skin is warm and dry.     Findings: No erythema or rash.  Neurological:     Mental Status: He is alert and oriented to person, place, and time.     ED Results and Treatments Labs (all labs ordered are listed, but only abnormal results are displayed) Labs Reviewed  CBC WITH DIFFERENTIAL/PLATELET - Abnormal; Notable for the following components:      Result Value   WBC 12.4 (*)    Neutro Abs 9.2 (*)    All other components within normal limits  COMPREHENSIVE METABOLIC PANEL  PROTIME-INR  APTT  TROPONIN I (HIGH SENSITIVITY)                                                                                                                         EKG  EKG Interpretation  Date/Time:  Thursday November 20 2020 23:03:17 EDT Ventricular Rate:  88 PR Interval:  189 QRS Duration: 87 QT Interval:  362 QTC Calculation: 438 R Axis:   58 Text Interpretation: Sinus rhythm Borderline low voltage, extremity leads No significant change since last tracing Confirmed by Addison Lank [id number] on 11/20/2020 11:09:03 PM      Radiology No results found.  Pertinent labs & imaging results that were available during my care of the patient were reviewed by me and considered in my medical decision making (see chart for details).  Medications Ordered in  ED Medications  sodium chloride 0.9 % bolus 1,000 mL (1,000 mLs Intravenous New Bag/Given 11/20/20 2316)  Procedures Procedures  (including critical care time)  Medical Decision Making / ED Course I have reviewed the nursing notes for this encounter and the patient's prior records (if available in EHR or on provided paperwork).   Stratton Villwock Spalla was evaluated in Emergency Department on 11/20/2020 for the symptoms described in the history of present illness. He was evaluated in the context of the global COVID-19 pandemic, which necessitated consideration that the patient might be at risk for infection with the SARS-CoV-2 virus that causes COVID-19. Institutional protocols and algorithms that pertain to the evaluation of patients at risk for COVID-19 are in a state of rapid change based on information released by regulatory bodies including the CDC and federal and state organizations. These policies and algorithms were followed during the patient's care in the ED.  Leg swelling More concerned with recurring DVT. Has h/o recurring clots while on coumadin and Xarelto in the past. Has IVC filter in place. Less likely cellulitis. Will get labs and [place].  Chest pain. Mild sharp. Possible PE, though I feel this is likely related to anxiety. EKG w/o acute ischemic changes or evidence of pericarditis. Doubt ACS.   Regardless, if he has a DVT he will need to be admitted and started on Hep gtt. He will need hemotology consultation during admission to determine next step in management of his recurring clots.      Final Clinical Impression(s) / ED Diagnoses Final diagnoses:  Chest pain      This chart was dictated using voice recognition software.  Despite best efforts to proofread,  errors can occur which can change the documentation meaning.

## 2020-11-21 ENCOUNTER — Other Ambulatory Visit: Payer: Self-pay

## 2020-11-21 ENCOUNTER — Encounter (HOSPITAL_COMMUNITY): Payer: Self-pay | Admitting: Family Medicine

## 2020-11-21 DIAGNOSIS — M7989 Other specified soft tissue disorders: Secondary | ICD-10-CM | POA: Diagnosis present

## 2020-11-21 DIAGNOSIS — J45909 Unspecified asthma, uncomplicated: Secondary | ICD-10-CM | POA: Diagnosis not present

## 2020-11-21 DIAGNOSIS — F411 Generalized anxiety disorder: Secondary | ICD-10-CM | POA: Diagnosis not present

## 2020-11-21 DIAGNOSIS — I82402 Acute embolism and thrombosis of unspecified deep veins of left lower extremity: Secondary | ICD-10-CM | POA: Diagnosis present

## 2020-11-21 DIAGNOSIS — I82412 Acute embolism and thrombosis of left femoral vein: Secondary | ICD-10-CM

## 2020-11-21 DIAGNOSIS — E1165 Type 2 diabetes mellitus with hyperglycemia: Secondary | ICD-10-CM

## 2020-11-21 DIAGNOSIS — F330 Major depressive disorder, recurrent, mild: Secondary | ICD-10-CM | POA: Diagnosis not present

## 2020-11-21 DIAGNOSIS — Z7984 Long term (current) use of oral hypoglycemic drugs: Secondary | ICD-10-CM | POA: Diagnosis not present

## 2020-11-21 DIAGNOSIS — E119 Type 2 diabetes mellitus without complications: Secondary | ICD-10-CM | POA: Diagnosis not present

## 2020-11-21 DIAGNOSIS — I82432 Acute embolism and thrombosis of left popliteal vein: Secondary | ICD-10-CM

## 2020-11-21 DIAGNOSIS — Z87891 Personal history of nicotine dependence: Secondary | ICD-10-CM | POA: Diagnosis not present

## 2020-11-21 DIAGNOSIS — Z20822 Contact with and (suspected) exposure to covid-19: Secondary | ICD-10-CM | POA: Diagnosis not present

## 2020-11-21 DIAGNOSIS — Z79899 Other long term (current) drug therapy: Secondary | ICD-10-CM | POA: Diagnosis not present

## 2020-11-21 LAB — COMPREHENSIVE METABOLIC PANEL
ALT: 53 U/L — ABNORMAL HIGH (ref 0–44)
AST: 27 U/L (ref 15–41)
Albumin: 4.3 g/dL (ref 3.5–5.0)
Alkaline Phosphatase: 54 U/L (ref 38–126)
Anion gap: 9 (ref 5–15)
BUN: 18 mg/dL (ref 6–20)
CO2: 25 mmol/L (ref 22–32)
Calcium: 9.5 mg/dL (ref 8.9–10.3)
Chloride: 104 mmol/L (ref 98–111)
Creatinine, Ser: 1 mg/dL (ref 0.61–1.24)
GFR, Estimated: >60 mL/min (ref >60)
Glucose, Bld: 148 mg/dL — ABNORMAL HIGH (ref 70–99)
Potassium: 3.7 mmol/L (ref 3.5–5.1)
Sodium: 138 mmol/L (ref 135–145)
Total Bilirubin: 0.3 mg/dL (ref 0.3–1.2)
Total Protein: 7 g/dL (ref 6.5–8.1)

## 2020-11-21 LAB — RESP PANEL BY RT-PCR (FLU A&B, COVID) ARPGX2
Influenza A by PCR: NEGATIVE
Influenza B by PCR: NEGATIVE
SARS Coronavirus 2 by RT PCR: NEGATIVE

## 2020-11-21 LAB — HEPARIN LEVEL (UNFRACTIONATED)
Heparin Unfractionated: 0.64 IU/mL (ref 0.30–0.70)
Heparin Unfractionated: 0.73 IU/mL — ABNORMAL HIGH (ref 0.30–0.70)

## 2020-11-21 LAB — GLUCOSE, CAPILLARY
Glucose-Capillary: 140 mg/dL — ABNORMAL HIGH (ref 70–99)
Glucose-Capillary: 83 mg/dL (ref 70–99)
Glucose-Capillary: 163 mg/dL — ABNORMAL HIGH (ref 70–99)

## 2020-11-21 LAB — HIV ANTIBODY (ROUTINE TESTING W REFLEX): HIV Screen 4th Generation wRfx: NONREACTIVE

## 2020-11-21 MED ORDER — FENTANYL CITRATE (PF) 100 MCG/2ML IJ SOLN: 25.0000 ug | INTRAMUSCULAR | Status: DC | PRN

## 2020-11-21 MED ORDER — SENNOSIDES-DOCUSATE SODIUM 8.6-50 MG PO TABS: 1.0000 | ORAL_TABLET | Freq: Every evening | ORAL | Status: DC | PRN

## 2020-11-21 MED ORDER — HEPARIN (PORCINE) 25000 UT/250ML-% IV SOLN
1750.0000 [IU]/h | INTRAVENOUS | Status: DC
  Administered 2020-11-21 (×2): 1800 [IU]/h via INTRAVENOUS
  Filled 2020-11-21 (×2): qty 250

## 2020-11-21 MED ORDER — INSULIN ASPART 100 UNIT/ML ~~LOC~~ SOLN
0.0000 [IU] | SUBCUTANEOUS | Status: DC
  Administered 2020-11-21: 1 [IU] via SUBCUTANEOUS

## 2020-11-21 MED ORDER — TRAZODONE HCL 50 MG PO TABS: 50.0000 mg | ORAL_TABLET | Freq: Every evening | ORAL | Status: DC | PRN

## 2020-11-21 MED ORDER — PRAZOSIN HCL 2 MG PO CAPS
5.0000 mg | ORAL_CAPSULE | Freq: Every day | ORAL | Status: DC
  Filled 2020-11-21: qty 1

## 2020-11-21 MED ORDER — HYDROXYZINE HCL 25 MG PO TABS: 50.0000 mg | ORAL_TABLET | Freq: Two times a day (BID) | ORAL | Status: DC | PRN

## 2020-11-21 MED ORDER — LAMOTRIGINE 25 MG PO TABS: 200.0000 mg | ORAL_TABLET | Freq: Every day | ORAL | Status: DC

## 2020-11-21 MED ORDER — LORAZEPAM 0.5 MG PO TABS: 0.5000 mg | ORAL_TABLET | Freq: Every day | ORAL | Status: DC | PRN

## 2020-11-21 MED ORDER — DOXYCYCLINE MONOHYDRATE 100 MG PO TABS: 100.0000 mg | ORAL_TABLET | Freq: Two times a day (BID) | ORAL | 0 refills | Status: AC

## 2020-11-21 MED ORDER — ONDANSETRON HCL 4 MG PO TABS: 4.0000 mg | ORAL_TABLET | Freq: Four times a day (QID) | ORAL | Status: DC | PRN

## 2020-11-21 MED ORDER — SODIUM CHLORIDE 0.9 % IV SOLN: INTRAVENOUS | Status: AC

## 2020-11-21 MED ORDER — ACETAMINOPHEN 325 MG PO TABS: 650.0000 mg | ORAL_TABLET | Freq: Four times a day (QID) | ORAL | Status: DC | PRN

## 2020-11-21 MED ORDER — BUPROPION HCL ER (XL) 300 MG PO TB24
450.0000 mg | ORAL_TABLET | Freq: Every day | ORAL | Status: DC
  Administered 2020-11-21: 450 mg via ORAL
  Filled 2020-11-21: qty 1

## 2020-11-21 MED ORDER — ACETAMINOPHEN 650 MG RE SUPP: 650.0000 mg | Freq: Four times a day (QID) | RECTAL | Status: DC | PRN

## 2020-11-21 MED ORDER — ONDANSETRON HCL 4 MG/2ML IJ SOLN: 4.0000 mg | Freq: Four times a day (QID) | INTRAMUSCULAR | Status: DC | PRN

## 2020-11-21 MED ORDER — HEPARIN BOLUS VIA INFUSION
5000.0000 [IU] | Freq: Once | INTRAVENOUS | Status: AC
  Administered 2020-11-21: 5000 [IU] via INTRAVENOUS

## 2020-11-21 NOTE — Progress Notes (Signed)
[place] for Heparin Indication: DVT  Allergies  Allergen Reactions  . Norco [Hydrocodone-Acetaminophen] Other (See Comments)    Paralysis-everything but breathing  . Penicillins Other (See Comments)    Did it involve swelling of the face/tongue/throat, SOB, or low BP? N/A Did it involve sudden or severe rash/hives, skin peeling, or any reaction on the inside of your mouth or nose? N/A Did you need to seek medical attention at a hospital or doctor's office? N/A When did it last happen?Child  If all above answers are "NO", may proceed with cephalosporin use.    Patient Measurements: Height: 5\' 11"  (180.3 cm) Weight: 111.1 kg (245 lb) IBW/kg (Calculated) : 75.3 Heparin Dosing Weight: 100  Vital Signs: Temp: 98.6 F (37 C) (04/22 1054) Temp Source: Oral (04/22 1054) BP: 112/81 (04/22 1054) Pulse Rate: 73 (04/22 1054)  Labs: Recent Labs    11/20/20 2311 11/21/20 0005 11/21/20 0851 11/21/20 1506  HGB 13.8  --   --   --   HCT 39.7  --   --   --   PLT 299  --   --   --   APTT 29  --   --   --   LABPROT 13.5  --   --   --   INR 1.0  --   --   --   HEPARINUNFRC  --   --  0.64 0.73*  CREATININE  --  1.00  --   --   TROPONINIHS <2  --   --   --     Estimated Creatinine Clearance: 113.2 mL/min (by C-G formula based on SCr of 1 mg/dL).   Medical History: Past Medical History:  Diagnosis Date  . Anxiety   . Asthma   . Depression   . Diabetes ([place])   . DVT (deep venous thrombosis), hx of recurrent 05/23/2012  . Headache(784.0)    frequent  . History of blood clot in brain, 2012 - followed by [facility] 05/23/2012  . History of blood clots   . History of suicidal tendencies   . Hyperlipemia 08/25/2012  . Hyperlipidemia   . Kidney disease   . Kidney stones   . Migraines   . Obesity   . Pneumonia   . Seizure ([place])   . Seizure disorder - followed by [place] [facility] 05/23/2012  . Sleep apnea   . Tuberculosis      Medications:  No current facility-administered medications on file prior to encounter.   Current Outpatient Medications on File Prior to Encounter  Medication Sig Dispense Refill  . buPROPion (WELLBUTRIN XL) 150 MG 24 hr tablet Take 3 tablets (450 mg total) by mouth daily. 270 tablet 2  . canagliflozin (INVOKANA) 300 MG TABS tablet Take 1 tablet (300 mg total) by mouth daily. 90 tablet 3  . enoxaparin (LOVENOX) 120 MG/0.8ML injection Inject 0.8 mLs (120 mg total) into the skin every 12 (twelve) hours. 48 mL 5  . hydrOXYzine (ATARAX/VISTARIL) 50 MG tablet TAKE 1 TABLET (50 MG TOTAL) BY MOUTH 2 (TWO) TIMES DAILY AS NEEDED FOR ANXIETY. 180 tablet 1  . lamoTRIgine (LAMICTAL) 200 MG tablet Take 1 tablet (200 mg total) by mouth at bedtime. 90 tablet 0  . LORazepam (ATIVAN) 0.5 MG tablet Take 1 tablet (0.5 mg total) by mouth daily as needed for anxiety. 30 tablet 2  . metFORMIN (GLUCOPHAGE-XR) 500 MG 24 hr tablet Take 1 tablet (500 mg total) by mouth daily. 90 tablet 3  .  prazosin (MINIPRESS) 5 MG capsule Take 1 capsule (5 mg total) by mouth at bedtime. 90 capsule 0  . repaglinide (PRANDIN) 2 MG tablet Take 1 tablet (2 mg total) by mouth 3 (three) times daily before meals. 270 tablet 4  . Semaglutide (RYBELSUS) 3 MG TABS Take 3 mg by mouth daily. 90 tablet 3  . traZODone (DESYREL) 50 MG tablet TAKE 1 TABLET BY MOUTH AT BEDTIME AS NEEDED AND MAY REPEAT DOSE ONE TIME IF NEEDED FOR SLEEP. (Patient taking differently: Take 50 mg by mouth at bedtime as needed for sleep (may repeat dose one time if needed).) 180 tablet 1  . vortioxetine HBr (TRINTELLIX) 20 MG TABS tablet Take 1 tablet (20 mg total) by mouth daily. 30 tablet 2  . atorvastatin (LIPITOR) 40 MG tablet TAKE 1 TABLET BY MOUTH EVERYDAY AT BEDTIME (Patient not taking: Reported on 11/21/2020) 30 tablet 0  . lidocaine (LIDODERM) 5 % Place 1 patch onto the skin daily. Remove & Discard patch within 12 hours or as directed by MD (Patient not taking:  Reported on 11/21/2020) 30 patch 0  . naproxen (NAPROSYN) 500 MG tablet Take 1 tablet (500 mg total) by mouth 2 (two) times daily. (Patient not taking: Reported on 11/21/2020) 30 tablet 0     Assessment: 50 y.o. male with h/o DVT on Lovenox, admitted with recurrent DVT, on heparin.  Last dose of Lovenox was 4/20. He follows with hematology and had recurrent VTE on warfarin and Xarelto -heparin level slightly above goal -hg= 12.8  Goal of Therapy:  Heparin level 0.3-0.7 units/ml Monitor platelets by anticoagulation protocol: Yes   Plan:  -Decrease heparin at 1750 units/hr -Daily heparin level and CBC  Alanda Slim, PharmD, [facility] Clinical Pharmacist Please see AMION for all Pharmacists' Contact Phone Numbers 11/21/2020, 4:33 PM

## 2020-11-21 NOTE — H&P (Signed)
History and Physical    Bryan Mcmillan [id number] DOB: 24-May-1971 DOA: 11/20/2020  PCP: Orson Slick, MD   Patient coming from: Home   Chief Complaint: Left leg pain, swelling, redness   HPI: Bryan Mcmillan is a 50 y.o. male with medical history significant for type 2 diabetes mellitus, depression, anxiety, and history of DVT and PE, now presenting to the emergency department for evaluation of left leg pain, swelling, and redness.  Patient reports history of multiple lower extremity DVTs, more often on the left, PE, currently managed with Lovenox, and development over the past 1 week of left lower extremity swelling and increasing pain.  He then developed some erythema over the lower left leg anteriorly in the past day.  He denies any chest pain, shortness of breath, or hemoptysis.  He reports development of a DVT while therapeutic on warfarin, also reports history of VTE while on Xarelto, has history of IVC filter which has since been removed, and has most recently been managed with Lovenox.  He missed a few days of Lovenox 3 or 4 weeks ago but not since.  He follows with hematology, Dr. Lorenso Courier.  [facility] ED Course: Upon arrival to the ED, patient is found to be afebrile, saturating mid 90s on room air, and with stable blood pressure.  EKG features sinus rhythm.  Chest x-ray negative for acute cardiopulmonary disease.  Chemistry panel with slight elevation in ALT.  CBC with leukocytosis to 12,400.  COVID-19 PCR was negative.  Lower extremity ultrasound is positive for DVT involving common femoral, superficial femoral, and popliteal veins.  Vascular surgery was consulted by the ED physician and the patient was started on IV heparin.  He was transferred to [facility] for ongoing evaluation and management.  Review of Systems:  All other systems reviewed and apart from HPI, are negative.  Past Medical History:  Diagnosis Date  . Anxiety   . Asthma    . Depression   . Diabetes ([place])   . DVT (deep venous thrombosis), hx of recurrent 05/23/2012  . Headache(784.0)    frequent  . History of blood clot in brain, 2012 - followed by [facility] 05/23/2012  . History of blood clots   . History of suicidal tendencies   . Hyperlipemia 08/25/2012  . Hyperlipidemia   . Kidney disease   . Kidney stones   . Migraines   . Obesity   . Pneumonia   . Seizure ([place])   . Seizure disorder - followed by [facility] 05/23/2012  . Sleep apnea   . Tuberculosis     Past Surgical History:  Procedure Laterality Date  . BRAIN SURGERY    . filter for blood clots    . TOE AMPUTATION Right 2012    Social History:   reports that he has quit smoking. He has never used smokeless tobacco. He reports that he does not drink alcohol and does not use drugs.  Allergies  Allergen Reactions  . Hydrocodone-Acetaminophen Shortness Of Breath  . Penicillins Other (See Comments)    Did it involve swelling of the face/tongue/throat, SOB, or low BP? N/A Did it involve sudden or severe rash/hives, skin peeling, or any reaction on the inside of your mouth or nose? N/A Did you need to seek medical attention at a hospital or doctor's office? N/A When did it last happen?Child  If all above answers are "NO", may proceed with cephalosporin use.    Family History  Problem Relation Age  of Onset  . Hyperlipidemia Mother   . Depression Mother   . Heart disease Father   . Stroke Other        parent  . Diabetes Other        grandparent /parent  . Obesity Other   . Heart attack Other      Prior to Admission medications   Medication Sig Start Date End Date Taking? Authorizing Provider  traZODone (DESYREL) 50 MG tablet TAKE 1 TABLET BY MOUTH AT BEDTIME AS NEEDED AND MAY REPEAT DOSE ONE TIME IF NEEDED FOR SLEEP. 02/26/20  Yes Pucilowski, Olgierd A, MD  atorvastatin (LIPITOR) 40 MG tablet TAKE 1 TABLET BY MOUTH EVERYDAY AT BEDTIME 05/01/20   [name], Rayford Halsted, MD  buPROPion (WELLBUTRIN XL) 150 MG 24 hr tablet Take 3 tablets (450 mg total) by mouth daily. 09/29/20 12/28/20  Pucilowski, Marchia Bond, MD  canagliflozin (INVOKANA) 300 MG TABS tablet Take 1 tablet (300 mg total) by mouth daily. 10/15/20   Renato Shin, MD  enoxaparin (LOVENOX) 120 MG/0.8ML injection Inject 0.8 mLs (120 mg total) into the skin every 12 (twelve) hours. 10/30/20   Orson Slick, MD  hydrOXYzine (ATARAX/VISTARIL) 50 MG tablet TAKE 1 TABLET (50 MG TOTAL) BY MOUTH 2 (TWO) TIMES DAILY AS NEEDED FOR ANXIETY. 08/25/20 11/23/20  Pucilowski, Marchia Bond, MD  lamoTRIgine (LAMICTAL) 200 MG tablet Take 1 tablet (200 mg total) by mouth at bedtime. 09/29/20 12/28/20  Pucilowski, Olgierd A, MD  lidocaine (LIDODERM) 5 % Place 1 patch onto the skin daily. Remove & Discard patch within 12 hours or as directed by MD 02/18/20   Henderly, Britni A, PA-C  LORazepam (ATIVAN) 0.5 MG tablet Take 1 tablet (0.5 mg total) by mouth daily as needed for anxiety. 09/29/20 12/28/20  Pucilowski, Marchia Bond, MD  metFORMIN (GLUCOPHAGE-XR) 500 MG 24 hr tablet Take 1 tablet (500 mg total) by mouth daily. 10/15/20   Renato Shin, MD  naproxen (NAPROSYN) 500 MG tablet Take 1 tablet (500 mg total) by mouth 2 (two) times daily. 02/18/20   Henderly, Britni A, PA-C  prazosin (MINIPRESS) 5 MG capsule Take 1 capsule (5 mg total) by mouth at bedtime. 11/09/20 02/07/21  Pucilowski, Marchia Bond, MD  repaglinide (PRANDIN) 2 MG tablet Take 1 tablet (2 mg total) by mouth 3 (three) times daily before meals. 10/15/20   Renato Shin, MD  Semaglutide (RYBELSUS) 3 MG TABS Take 3 mg by mouth daily. 10/19/20   Renato Shin, MD  vortioxetine HBr (TRINTELLIX) 20 MG TABS tablet Take 1 tablet (20 mg total) by mouth daily. 09/29/20 12/28/20  Pucilowski, Marchia Bond, MD    Physical Exam: Vitals:   11/21/20 0200 11/21/20 0215 11/21/20 0230 11/21/20 0607  BP: 116/73 120/71 117/69 113/73  Pulse: 78 79 79 71  Resp: 18 19 20 19   Temp:    98.1 F (36.7 C)   TempSrc:    Oral  SpO2: 95% 98% 96% 93%  Weight:      Height:        Constitutional: NAD, calm  Eyes: PERTLA, lids and conjunctivae normal ENMT: Mucous membranes are moist. Posterior pharynx clear of any exudate or lesions.   Neck: supple, no masses  Respiratory: no wheezing, no crackles. No accessory muscle use.  Cardiovascular: S1 & S2 heard, regular rate and rhythm. No significant JVD. Abdomen: No distension, no tenderness, soft. Bowel sounds active.  Musculoskeletal: no clubbing / cyanosis. No joint deformity upper and lower extremities.   Skin: Erythema involving  lower left leg anteriorly. Warm, dry, well-perfused. Neurologic: CN 2-12 grossly intact. Sensation intact. Moving all extremities.  Psychiatric: Alert and oriented to person, place, and situation. Calm and cooperative.    Labs and Imaging on Admission: I have personally reviewed following labs and imaging studies  CBC: Recent Labs  Lab 11/20/20 2311  WBC 12.4*  NEUTROABS 9.2*  HGB 13.8  HCT 39.7  MCV 83.6  PLT 123XX123   Basic Metabolic Panel: Recent Labs  Lab 11/21/20 0005  NA 138  K 3.7  CL 104  CO2 25  GLUCOSE 148*  BUN 18  CREATININE 1.00  CALCIUM 9.5   GFR: Estimated Creatinine Clearance: 113.2 mL/min (by C-G formula based on SCr of 1 mg/dL). Liver Function Tests: Recent Labs  Lab 11/21/20 0005  AST 27  ALT 53*  ALKPHOS 54  BILITOT 0.3  PROT 7.0  ALBUMIN 4.3   No results for input(s): LIPASE, AMYLASE in the last 168 hours. No results for input(s): AMMONIA in the last 168 hours. Coagulation Profile: Recent Labs  Lab 11/20/20 2311  INR 1.0   Cardiac Enzymes: No results for input(s): CKTOTAL, CKMB, CKMBINDEX, TROPONINI in the last 168 hours. BNP (last 3 results) No results for input(s): PROBNP in the last 8760 hours. HbA1C: No results for input(s): HGBA1C in the last 72 hours. CBG: No results for input(s): GLUCAP in the last 168 hours. Lipid Profile: No results for input(s):  CHOL, HDL, LDLCALC, TRIG, CHOLHDL, LDLDIRECT in the last 72 hours. Thyroid Function Tests: No results for input(s): TSH, T4TOTAL, FREET4, T3FREE, THYROIDAB in the last 72 hours. Anemia Panel: No results for input(s): VITAMINB12, FOLATE, FERRITIN, TIBC, IRON, RETICCTPCT in the last 72 hours. Urine analysis:    Component Value Date/Time   COLORURINE YELLOW 07/11/2013 2126   APPEARANCEUR CLEAR 07/11/2013 2126   LABSPEC 1.039 (H) 07/11/2013 2126   PHURINE 5.0 07/11/2013 2126   GLUCOSEU >1000 (A) 07/11/2013 2126   HGBUR NEGATIVE 07/11/2013 2126   BILIRUBINUR SMALL (A) 07/11/2013 2126   KETONESUR 15 (A) 07/11/2013 2126   PROTEINUR 30 (A) 07/11/2013 2126   UROBILINOGEN 0.2 07/11/2013 2126   NITRITE NEGATIVE 07/11/2013 2126   LEUKOCYTESUR NEGATIVE 07/11/2013 2126   Sepsis Labs: @LABRCNTIP (procalcitonin:4,lacticidven:4) ) Recent Results (from the past 240 hour(s))  Resp Panel by RT-PCR (Flu A&B, Covid) Nasopharyngeal Swab     Status: None   Collection Time: 11/21/20  1:38 AM   Specimen: Nasopharyngeal Swab; Nasopharyngeal(NP) swabs in vial transport medium  Result Value Ref Range Status   SARS Coronavirus 2 by RT PCR NEGATIVE NEGATIVE Final    Comment: (NOTE) SARS-CoV-2 target nucleic acids are NOT DETECTED.  The SARS-CoV-2 RNA is generally detectable in upper respiratory specimens during the acute phase of infection. The lowest concentration of SARS-CoV-2 viral copies this assay can detect is 138 copies/mL. A negative result does not preclude SARS-Cov-2 infection and should not be used as the sole basis for treatment or other patient management decisions. A negative result may occur with  improper specimen collection/handling, submission of specimen other than nasopharyngeal swab, presence of viral mutation(s) within the areas targeted by this assay, and inadequate number of viral copies(<138 copies/mL). A negative result must be combined with clinical observations, patient  history, and epidemiological information. The expected result is Negative.  Fact Sheet for Patients:  EntrepreneurPulse.com.au  Fact Sheet for Healthcare Providers:  IncredibleEmployment.be  This test is no t yet approved or cleared by the [place] FDA and  has been authorized for detection  and/or diagnosis of SARS-CoV-2 by FDA under an Emergency Use Authorization (EUA). This EUA will remain  in effect (meaning this test can be used) for the duration of the COVID-19 declaration under Section 564(b)(1) of the Act, 21 U.S.C.section 360bbb-3(b)(1), unless the authorization is terminated  or revoked sooner.       Influenza A by PCR NEGATIVE NEGATIVE Final   Influenza B by PCR NEGATIVE NEGATIVE Final    Comment: (NOTE) The Xpert Xpress SARS-CoV-2/FLU/RSV plus assay is intended as an aid in the diagnosis of influenza from Nasopharyngeal swab specimens and should not be used as a sole basis for treatment. Nasal washings and aspirates are unacceptable for Xpert Xpress SARS-CoV-2/FLU/RSV testing.  Fact Sheet for Patients: EntrepreneurPulse.com.au  Fact Sheet for Healthcare Providers: IncredibleEmployment.be  This test is not yet approved or cleared by the [place] FDA and has been authorized for detection and/or diagnosis of SARS-CoV-2 by FDA under an Emergency Use Authorization (EUA). This EUA will remain in effect (meaning this test can be used) for the duration of the COVID-19 declaration under Section 564(b)(1) of the Act, 21 U.S.C. section 360bbb-3(b)(1), unless the authorization is terminated or revoked.  Performed at [facility]      Radiological Exams on Admission: DG Chest 2 View  Result Date: 11/21/2020 CLINICAL DATA:  Leg swelling and rash, history of DVT EXAM: CHEST - 2 VIEW COMPARISON:  Radiograph 06/04/2019, CT 04/07/2013 FINDINGS: No consolidation, features of  edema, pneumothorax, or effusion. Pulmonary vascularity is normally distributed. The cardiomediastinal contours are unremarkable. No acute osseous or soft tissue abnormality. Telemetry leads overlie the chest. IMPRESSION: No acute cardiopulmonary abnormality. Electronically Signed   By: Lovena Le M.D.   On: 11/21/2020 00:06   US Venous Img Lower Unilateral Left  Result Date: 11/21/2020 CLINICAL DATA:  Leg swelling EXAM: LEFT LOWER EXTREMITY VENOUS DOPPLER ULTRASOUND TECHNIQUE: Gray-scale sonography with graded compression, as well as color Doppler and duplex ultrasound were performed to evaluate the lower extremity deep venous systems from the level of the common femoral vein and including the common femoral, femoral, profunda femoral, popliteal and calf veins including the posterior tibial, peroneal and gastrocnemius veins when visible. The superficial great saphenous vein was also interrogated. Spectral Doppler was utilized to evaluate flow at rest and with distal augmentation maneuvers in the common femoral, femoral and popliteal veins. COMPARISON:  None. FINDINGS: Contralateral Common Femoral Vein: Respiratory phasicity is normal and symmetric with the symptomatic side. No evidence of thrombus. Normal compressibility. Common Femoral Vein: Thrombus is noted with decreased compressibility. Saphenofemoral Junction: No evidence of thrombus. Normal compressibility and flow on color Doppler imaging. Profunda Femoral Vein: No evidence of thrombus. Normal compressibility and flow on color Doppler imaging. Femoral Vein: Thrombus is noted with decreased compressibility. Popliteal Vein: Thrombus is noted with decreased compressibility. Calf Veins: No evidence of thrombus. Normal compressibility and flow on color Doppler imaging. Superficial Great Saphenous Vein: No evidence of thrombus. Normal compressibility. Venous Reflux:  None. Other Findings:  None. IMPRESSION: Left lower extremity deep venous thrombosis is  noted involving the common femoral, superficial femoral and popliteal veins. Electronically Signed   By: Inez Catalina M.D.   On: 11/21/2020 00:53    EKG: Independently reviewed. Sinus rhythm.   Assessment/Plan   1. Acute left leg DVT  - Patient with history of multiple LE DVTs and PE currently on Lovenox presents with 1 wk of LLE swelling and pain and 1 day of erythema  - US reveals DVT involving left  common femoral, superficial femoral, and popliteal veins  - Foot is warm with intact pulses; no evidence for PE  - IV heparin was started in ED and vascular surgery consulted by ED physician  - Continue IV heparin, follow-up vascular surgery recommendations    2. Type II DM  - A1c was 12.6% in March 2022  - Check CBGs and use SSI for now   3. Depression, anxiety, insomnia  - Continue Wellbutrin, Lamictal, Prazosin, as-needed Ativan, and as-needed trazodone    DVT prophylaxis: IV heparin  Code Status: Full  Level of Care: Level of care: Telemetry Medical Family Communication: No present  Disposition Plan:  Patient is from: Home  Anticipated d/c is to: Home  Anticipated d/c date is: 11/23/20 Patient currently: Pending vascular surgery consultation, transition to oral or sq anticoagulant  Consults called: Vascular surgery  Admission status: Observation     Vianne Bulls, MD [facility]  11/21/2020, 6:28 AM

## 2020-11-21 NOTE — Consult Note (Addendum)
Hospital Consult    Reason for Consult:  DVT LLE Requesting Physician:  Opyd MRN #:  [id number]  History of Present Illness: This is a 50 y.o. male who presents with painful, swollen and red LLE.  He has hx of multiple DVT's/PE and has developed DVT on therapeutic coumadin as well as Xarelto.  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  He does not have any family hx of clotting disorders.  He does wear knee high compression.  He states he did miss about a week of his anticoagulation due to miscommunication with the pharmacist.  He states most of the clots have been in the left leg but he has had clot in the right leg, heart, lungs and brain.  His previous clots were treated in [place]  He is followed by Dr. Lorenso Courier, hematologist.   The pt is on a statin for cholesterol management.  The pt is not on a daily aspirin.   Other AC:  Lovenox (currently on heparin) The pt is not on medication for hypertension.   The pt is diabetic.   Tobacco hx:  former   Past Medical History:  Diagnosis Date  . Anxiety   . Asthma   . Depression   . Diabetes ([place])   . DVT (deep venous thrombosis), hx of recurrent 05/23/2012  . Headache(784.0)    frequent  . History of blood clot in brain, 2012 - followed by [facility] 05/23/2012  . History of blood clots   . History of suicidal tendencies   . Hyperlipemia 08/25/2012  . Hyperlipidemia   . Kidney disease   . Kidney stones   . Migraines   . Obesity   . Pneumonia   . Seizure ([place])   . Seizure disorder - followed by [facility] 05/23/2012  . Sleep apnea   . Tuberculosis     Past Surgical History:  Procedure Laterality Date  . BRAIN SURGERY    . filter for blood clots    . TOE AMPUTATION Right 2012    Allergies  Allergen Reactions  . Norco [Hydrocodone-Acetaminophen] Other (See Comments)    Paralysis-everything but breathing  . Penicillins Other (See Comments)    Did it  involve swelling of the face/tongue/throat, SOB, or low BP? N/A Did it involve sudden or severe rash/hives, skin peeling, or any reaction on the inside of your mouth or nose? N/A Did you need to seek medical attention at a hospital or doctor's office? N/A When did it last happen?Child  If all above answers are "NO", may proceed with cephalosporin use.    Prior to Admission medications   Medication Sig Start Date End Date Taking? Authorizing Provider  buPROPion (WELLBUTRIN XL) 150 MG 24 hr tablet Take 3 tablets (450 mg total) by mouth daily. 09/29/20 12/28/20 Yes Pucilowski, Olgierd A, MD  canagliflozin (INVOKANA) 300 MG TABS tablet Take 1 tablet (300 mg total) by mouth daily. 10/15/20  Yes Renato Shin, MD  enoxaparin (LOVENOX) 120 MG/0.8ML injection Inject 0.8 mLs (120 mg total) into the skin every 12 (twelve) hours. 10/30/20  Yes Orson Slick, MD  hydrOXYzine (ATARAX/VISTARIL) 50 MG tablet TAKE 1 TABLET (50 MG TOTAL) BY MOUTH 2 (TWO) TIMES DAILY AS NEEDED FOR ANXIETY. 08/25/20 11/23/20 Yes Pucilowski, Olgierd A, MD  lamoTRIgine (LAMICTAL) 200 MG tablet Take 1 tablet (200 mg total) by mouth at bedtime. 09/29/20 12/28/20 Yes Pucilowski, Marchia Bond, MD  LORazepam (  ATIVAN) 0.5 MG tablet Take 1 tablet (0.5 mg total) by mouth daily as needed for anxiety. 09/29/20 12/28/20 Yes Pucilowski, Olgierd A, MD  metFORMIN (GLUCOPHAGE-XR) 500 MG 24 hr tablet Take 1 tablet (500 mg total) by mouth daily. 10/15/20  Yes Renato Shin, MD  prazosin (MINIPRESS) 5 MG capsule Take 1 capsule (5 mg total) by mouth at bedtime. 11/09/20 02/07/21 Yes Pucilowski, Olgierd A, MD  repaglinide (PRANDIN) 2 MG tablet Take 1 tablet (2 mg total) by mouth 3 (three) times daily before meals. 10/15/20  Yes Renato Shin, MD  Semaglutide (RYBELSUS) 3 MG TABS Take 3 mg by mouth daily. 10/19/20  Yes Renato Shin, MD  traZODone (DESYREL) 50 MG tablet TAKE 1 TABLET BY MOUTH AT BEDTIME AS NEEDED AND MAY REPEAT DOSE ONE TIME IF NEEDED FOR  SLEEP. Patient taking differently: Take 50 mg by mouth at bedtime as needed for sleep (may repeat dose one time if needed). 02/26/20  Yes Pucilowski, Olgierd A, MD  vortioxetine HBr (TRINTELLIX) 20 MG TABS tablet Take 1 tablet (20 mg total) by mouth daily. 09/29/20 12/28/20 Yes Pucilowski, Olgierd A, MD  atorvastatin (LIPITOR) 40 MG tablet TAKE 1 TABLET BY MOUTH EVERYDAY AT BEDTIME Patient not taking: Reported on 11/21/2020 05/01/20   [name], Rayford Halsted, MD  lidocaine (LIDODERM) 5 % Place 1 patch onto the skin daily. Remove & Discard patch within 12 hours or as directed by MD Patient not taking: Reported on 11/21/2020 02/18/20   Henderly, Britni A, PA-C  naproxen (NAPROSYN) 500 MG tablet Take 1 tablet (500 mg total) by mouth 2 (two) times daily. Patient not taking: Reported on 11/21/2020 02/18/20   Henderly, Britni A, PA-C    Social History   Socioeconomic History  . Marital status: Divorced    Spouse name: Not on file  . Number of children: 1  . Years of education: BA  . Highest education level: Not on file  Occupational History  . Occupation: CUST SERV    Employer: [employer]  Tobacco Use  . Smoking status: Former Research scientist (life sciences)  . Smokeless tobacco: Never Used  Vaping Use  . Vaping Use: Never used  Substance and Sexual Activity  . Alcohol use: No  . Drug use: No  . Sexual activity: Never  Other Topics Concern  . 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 Worried About Charity fundraiser in the Last Year: Never true  . Ran Out of Food in the Last Year: Never true  Transportation Needs: Not on file  Physical Activity: Not on file  Stress: Not  on file  Social Connections: Not on file  Intimate Partner Violence: Not on file     Family History  Problem Relation Age of Onset  . Hyperlipidemia Mother   . Depression Mother   . Heart disease Father   . Stroke Other        parent  . Diabetes Other        grandparent /parent  . Obesity Other   . Heart attack Other  ROS: [x]  Positive   [ ]  Negative   [ ]  All sytems reviewed and are negative  Cardiac: []  SOB  Vascular: [x]  hx of DVT [x]  swelling in legs  Pulmonary: [x]  asthma  Neurologic: [x]  hx of seizure   Hematologic: []  hx of cancer  Endocrine:   [x]  diabetes   GI []  GERD  GU: []  CKD/renal failure []  HD--[]  M/W/F or []  T/T/S  Psychiatric: [x]  anxiety [x]  depression  Musculoskeletal: []  arthritis []  joint pain  Integumentary: []  rashes []  ulcers  Constitutional: []  fever  []  chills  Physical Examination  Vitals:   11/21/20 0812 11/21/20 1054  BP: 114/74 112/81  Pulse: 70 73  Resp: 18 18  Temp: 98.1 F (36.7 C) 98.6 F (37 C)  SpO2: 94% 98%   Body mass index is 34.17 kg/m.  General:  WDWN in NAD Gait: Not observed HENT: WNL, normocephalic Pulmonary: normal non-labored breathing Cardiac: regular Skin: without rashes Vascular Exam/Pulses:  Right Left  DP 2+ (normal) 1+ (weak)  PT Unable to palpate Unable to palpate   Extremities: without ischemic changes, without Gangrene , without cellulitis; without open wounds;significant swelling left leg  Musculoskeletal: no muscle wasting or atrophy  Neurologic: A&O X 3; speech is fluent/normal Psychiatric:  The pt has Normal affect.  CBC    Component Value Date/Time   WBC 12.4 (H) 11/20/2020 2311   RBC 4.75 11/20/2020 2311   HGB 13.8 11/20/2020 2311   HGB 13.9 11/03/2020 1007   HCT 39.7 11/20/2020 2311   PLT 299 11/20/2020 2311   PLT 231 11/03/2020 1007   MCV 83.6 11/20/2020 2311   MCH 29.1 11/20/2020 2311   MCHC 34.8 11/20/2020 2311   RDW 12.9 11/20/2020 2311    LYMPHSABS 2.1 11/20/2020 2311   MONOABS 0.9 11/20/2020 2311   EOSABS 0.1 11/20/2020 2311   BASOSABS 0.1 11/20/2020 2311    BMET    Component Value Date/Time   NA 138 11/21/2020 0005   K 3.7 11/21/2020 0005   CL 104 11/21/2020 0005   CO2 25 11/21/2020 0005   GLUCOSE 148 (H) 11/21/2020 0005   BUN 18 11/21/2020 0005   CREATININE 1.00 11/21/2020 0005   CREATININE 1.12 11/03/2020 1007   CALCIUM 9.5 11/21/2020 0005   GFRNONAA >60 11/21/2020 0005   GFRNONAA >60 11/03/2020 1007   GFRAA >60 02/18/2020 0939    COAGS: Lab Results  Component Value Date   INR 1.0 11/20/2020   INR 1.03 07/11/2013   INR 1.0 04/25/2013     Non-Invasive Vascular Imaging:   Venous duplex 11/21/2020: IMPRESSION: Left lower extremity deep venous thrombosis is noted involving the common femoral, superficial femoral and popliteal veins.   ASSESSMENT/PLAN: This is a 50 y.o. male who presents with swollen, painful red left leg with findings of recurrent DVT and admitted and placed on heparin.  -pt evaluated with Dr. Stanford Breed.  He feels that the pt may benefit from a simple venous thrombectomy for symptomatic relief, but given this is a recurrent DVT, would not place venous stents.  The earliest this could be done would be either Tuesday or Wednesday and this was discussed with pt.  He does not need to stay hospitalized until then. Wound continue compression and leg elevation in the meantime.    Leontine Locket, PA-C [facility] [phone]  VASCULAR STAFF ADDENDUM: I have independently interviewed and examined the patient. I agree with the above.  Multiple episodes of thrombosis (DVT, PE, intracranial venous thrombus requiring craniotomy). Clearly  hypercoagulable, but no named diagnosis. Previous hypercoagulability workup in [place] did not yield a diagnosis per patient. Follows with Dr. Lorenso Courier.  I think he would be a good candidate for mechanical thrombectomy. I explained risks / benefits  / alternatives. He is understanding. Will arrange for Dr. Trula Slade to do this next week. Continue anticoagulation. OK for patient to DC.   Yevonne Aline. Stanford Breed, MD [facility] Phone Number: [phone] 11/21/2020 3:27 PM

## 2020-11-21 NOTE — ED Notes (Deleted)
Called [facility] for transport to [facility]

## 2020-11-21 NOTE — ED Notes (Signed)
[facility] present at bedside for transport.
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
## 2020-11-21 NOTE — ED Notes (Signed)
Called [facility] for transport to [facility] 4E room 01

## 2020-11-21 NOTE — H&P (View-Only) (Signed)
Hospital Consult    Reason for Consult:  DVT LLE Requesting Physician:  Opyd MRN #:  [id number]  History of Present Illness: This is a 50 y.o. male who presents with painful, swollen and red LLE.  He has hx of multiple DVT's/PE and has developed DVT on therapeutic coumadin as well as Xarelto.  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  He does not have any family hx of clotting disorders.  He does wear knee high compression.  He states he did miss about a week of his anticoagulation due to miscommunication with the pharmacist.  He states most of the clots have been in the left leg but he has had clot in the right leg, heart, lungs and brain.  His previous clots were treated in [place]  He is followed by Dr. Lorenso Courier, hematologist.   The pt is on a statin for cholesterol management.  The pt is not on a daily aspirin.   Other AC:  Lovenox (currently on heparin) The pt is not on medication for hypertension.   The pt is diabetic.   Tobacco hx:  former   Past Medical History:  Diagnosis Date  . Anxiety   . Asthma   . Depression   . Diabetes ([place])   . DVT (deep venous thrombosis), hx of recurrent 05/23/2012  . Headache(784.0)    frequent  . History of blood clot in brain, 2012 - followed by [facility] 05/23/2012  . History of blood clots   . History of suicidal tendencies   . Hyperlipemia 08/25/2012  . Hyperlipidemia   . Kidney disease   . Kidney stones   . Migraines   . Obesity   . Pneumonia   . Seizure ([place])   . Seizure disorder - followed by [facility] 05/23/2012  . Sleep apnea   . Tuberculosis     Past Surgical History:  Procedure Laterality Date  . BRAIN SURGERY    . filter for blood clots    . TOE AMPUTATION Right 2012    Allergies  Allergen Reactions  . Norco [Hydrocodone-Acetaminophen] Other (See Comments)    Paralysis-everything but breathing  . Penicillins Other (See Comments)    Did it  involve swelling of the face/tongue/throat, SOB, or low BP? N/A Did it involve sudden or severe rash/hives, skin peeling, or any reaction on the inside of your mouth or nose? N/A Did you need to seek medical attention at a hospital or doctor's office? N/A When did it last happen?Child  If all above answers are "NO", may proceed with cephalosporin use.    Prior to Admission medications   Medication Sig Start Date End Date Taking? Authorizing Provider  buPROPion (WELLBUTRIN XL) 150 MG 24 hr tablet Take 3 tablets (450 mg total) by mouth daily. 09/29/20 12/28/20 Yes Pucilowski, Olgierd A, MD  canagliflozin (INVOKANA) 300 MG TABS tablet Take 1 tablet (300 mg total) by mouth daily. 10/15/20  Yes Renato Shin, MD  enoxaparin (LOVENOX) 120 MG/0.8ML injection Inject 0.8 mLs (120 mg total) into the skin every 12 (twelve) hours. 10/30/20  Yes Orson Slick, MD  hydrOXYzine (ATARAX/VISTARIL) 50 MG tablet TAKE 1 TABLET (50 MG TOTAL) BY MOUTH 2 (TWO) TIMES DAILY AS NEEDED FOR ANXIETY. 08/25/20 11/23/20 Yes Pucilowski, Olgierd A, MD  lamoTRIgine (LAMICTAL) 200 MG tablet Take 1 tablet (200 mg total) by mouth at bedtime. 09/29/20 12/28/20 Yes Pucilowski, Marchia Bond, MD  LORazepam (  ATIVAN) 0.5 MG tablet Take 1 tablet (0.5 mg total) by mouth daily as needed for anxiety. 09/29/20 12/28/20 Yes Pucilowski, Olgierd A, MD  metFORMIN (GLUCOPHAGE-XR) 500 MG 24 hr tablet Take 1 tablet (500 mg total) by mouth daily. 10/15/20  Yes Renato Shin, MD  prazosin (MINIPRESS) 5 MG capsule Take 1 capsule (5 mg total) by mouth at bedtime. 11/09/20 02/07/21 Yes Pucilowski, Olgierd A, MD  repaglinide (PRANDIN) 2 MG tablet Take 1 tablet (2 mg total) by mouth 3 (three) times daily before meals. 10/15/20  Yes Renato Shin, MD  Semaglutide (RYBELSUS) 3 MG TABS Take 3 mg by mouth daily. 10/19/20  Yes Renato Shin, MD  traZODone (DESYREL) 50 MG tablet TAKE 1 TABLET BY MOUTH AT BEDTIME AS NEEDED AND MAY REPEAT DOSE ONE TIME IF NEEDED FOR  SLEEP. Patient taking differently: Take 50 mg by mouth at bedtime as needed for sleep (may repeat dose one time if needed). 02/26/20  Yes Pucilowski, Olgierd A, MD  vortioxetine HBr (TRINTELLIX) 20 MG TABS tablet Take 1 tablet (20 mg total) by mouth daily. 09/29/20 12/28/20 Yes Pucilowski, Olgierd A, MD  atorvastatin (LIPITOR) 40 MG tablet TAKE 1 TABLET BY MOUTH EVERYDAY AT BEDTIME Patient not taking: Reported on 11/21/2020 05/01/20   [name], Rayford Halsted, MD  lidocaine (LIDODERM) 5 % Place 1 patch onto the skin daily. Remove & Discard patch within 12 hours or as directed by MD Patient not taking: Reported on 11/21/2020 02/18/20   Henderly, Britni A, PA-C  naproxen (NAPROSYN) 500 MG tablet Take 1 tablet (500 mg total) by mouth 2 (two) times daily. Patient not taking: Reported on 11/21/2020 02/18/20   Henderly, Britni A, PA-C    Social History   Socioeconomic History  . Marital status: Divorced    Spouse name: Not on file  . Number of children: 1  . Years of education: BA  . Highest education level: Not on file  Occupational History  . Occupation: CUST SERV    Employer: [employer]  Tobacco Use  . Smoking status: Former Research scientist (life sciences)  . Smokeless tobacco: Never Used  Vaping Use  . Vaping Use: Never used  Substance and Sexual Activity  . Alcohol use: No  . Drug use: No  . Sexual activity: Never  Other Topics Concern  . 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 Worried About Charity fundraiser in the Last Year: Never true  . Ran Out of Food in the Last Year: Never true  Transportation Needs: Not on file  Physical Activity: Not on file  Stress: Not  on file  Social Connections: Not on file  Intimate Partner Violence: Not on file     Family History  Problem Relation Age of Onset  . Hyperlipidemia Mother   . Depression Mother   . Heart disease Father   . Stroke Other        parent  . Diabetes Other        grandparent /parent  . Obesity Other   . Heart attack Other  ROS: [x]  Positive   [ ]  Negative   [ ]  All sytems reviewed and are negative  Cardiac: []  SOB  Vascular: [x]  hx of DVT [x]  swelling in legs  Pulmonary: [x]  asthma  Neurologic: [x]  hx of seizure   Hematologic: []  hx of cancer  Endocrine:   [x]  diabetes   GI []  GERD  GU: []  CKD/renal failure []  HD--[]  M/W/F or []  T/T/S  Psychiatric: [x]  anxiety [x]  depression  Musculoskeletal: []  arthritis []  joint pain  Integumentary: []  rashes []  ulcers  Constitutional: []  fever  []  chills  Physical Examination  Vitals:   11/21/20 0812 11/21/20 1054  BP: 114/74 112/81  Pulse: 70 73  Resp: 18 18  Temp: 98.1 F (36.7 C) 98.6 F (37 C)  SpO2: 94% 98%   Body mass index is 34.17 kg/m.  General:  WDWN in NAD Gait: Not observed HENT: WNL, normocephalic Pulmonary: normal non-labored breathing Cardiac: regular Skin: without rashes Vascular Exam/Pulses:  Right Left  DP 2+ (normal) 1+ (weak)  PT Unable to palpate Unable to palpate   Extremities: without ischemic changes, without Gangrene , without cellulitis; without open wounds;significant swelling left leg  Musculoskeletal: no muscle wasting or atrophy  Neurologic: A&O X 3; speech is fluent/normal Psychiatric:  The pt has Normal affect.  CBC    Component Value Date/Time   WBC 12.4 (H) 11/20/2020 2311   RBC 4.75 11/20/2020 2311   HGB 13.8 11/20/2020 2311   HGB 13.9 11/03/2020 1007   HCT 39.7 11/20/2020 2311   PLT 299 11/20/2020 2311   PLT 231 11/03/2020 1007   MCV 83.6 11/20/2020 2311   MCH 29.1 11/20/2020 2311   MCHC 34.8 11/20/2020 2311   RDW 12.9 11/20/2020 2311    LYMPHSABS 2.1 11/20/2020 2311   MONOABS 0.9 11/20/2020 2311   EOSABS 0.1 11/20/2020 2311   BASOSABS 0.1 11/20/2020 2311    BMET    Component Value Date/Time   NA 138 11/21/2020 0005   K 3.7 11/21/2020 0005   CL 104 11/21/2020 0005   CO2 25 11/21/2020 0005   GLUCOSE 148 (H) 11/21/2020 0005   BUN 18 11/21/2020 0005   CREATININE 1.00 11/21/2020 0005   CREATININE 1.12 11/03/2020 1007   CALCIUM 9.5 11/21/2020 0005   GFRNONAA >60 11/21/2020 0005   GFRNONAA >60 11/03/2020 1007   GFRAA >60 02/18/2020 0939    COAGS: Lab Results  Component Value Date   INR 1.0 11/20/2020   INR 1.03 07/11/2013   INR 1.0 04/25/2013     Non-Invasive Vascular Imaging:   Venous duplex 11/21/2020: IMPRESSION: Left lower extremity deep venous thrombosis is noted involving the common femoral, superficial femoral and popliteal veins.   ASSESSMENT/PLAN: This is a 50 y.o. male who presents with swollen, painful red left leg with findings of recurrent DVT and admitted and placed on heparin.  -pt evaluated with Dr. Stanford Breed.  He feels that the pt may benefit from a simple venous thrombectomy for symptomatic relief, but given this is a recurrent DVT, would not place venous stents.  The earliest this could be done would be either Tuesday or Wednesday and this was discussed with pt.  He does not need to stay hospitalized until then. Wound continue compression and leg elevation in the meantime.    Leontine Locket, PA-C [facility] [phone]  VASCULAR STAFF ADDENDUM: I have independently interviewed and examined the patient. I agree with the above.  Multiple episodes of thrombosis (DVT, PE, intracranial venous thrombus requiring craniotomy). Clearly  hypercoagulable, but no named diagnosis. Previous hypercoagulability workup in [place] did not yield a diagnosis per patient. Follows with Dr. Lorenso Courier.  I think he would be a good candidate for mechanical thrombectomy. I explained risks / benefits  / alternatives. He is understanding. Will arrange for Dr. Trula Slade to do this next week. Continue anticoagulation. OK for patient to DC.   Yevonne Aline. Stanford Breed, MD [facility] Phone Number: [phone] 11/21/2020 3:27 PM

## 2020-11-21 NOTE — Progress Notes (Signed)
ANTICOAGULATION CONSULT NOTE - Initial Consult  Pharmacy Consult for Heparin Indication: DVT  Allergies  Allergen Reactions  . Hydrocodone-Acetaminophen Shortness Of Breath  . Penicillins Other (See Comments)    Did it involve swelling of the face/tongue/throat, SOB, or low BP? N/A Did it involve sudden or severe rash/hives, skin peeling, or any reaction on the inside of your mouth or nose? N/A Did you need to seek medical attention at a hospital or doctor's office? N/A When did it last happen?Child  If all above answers are "NO", may proceed with cephalosporin use.    Patient Measurements: Height: 5\' 11"  (180.3 cm) Weight: 111.1 kg (245 lb) IBW/kg (Calculated) : 75.3 Heparin Dosing Weight: 100  Vital Signs: Temp: 99.2 F (37.3 C) (04/21 2210) Temp Source: Oral (04/21 2210) BP: 117/73 (04/22 0045) Pulse Rate: 82 (04/22 0045)  Labs: Recent Labs    11/20/20 2311 11/21/20 0005  HGB 13.8  --   HCT 39.7  --   PLT 299  --   APTT 29  --   LABPROT 13.5  --   INR 1.0  --   CREATININE  --  1.00  TROPONINIHS <2  --     Estimated Creatinine Clearance: 113.2 mL/min (by C-G formula based on SCr of 1 mg/dL).   Medical History: Past Medical History:  Diagnosis Date  . Anxiety   . Asthma   . Depression   . Diabetes ([place])   . DVT (deep venous thrombosis), hx of recurrent 05/23/2012  . Headache(784.0)    frequent  . History of blood clot in brain, 2012 - followed by [facility] 05/23/2012  . History of blood clots   . History of suicidal tendencies   . Hyperlipemia 08/25/2012  . Hyperlipidemia   . Kidney disease   . Kidney stones   . Migraines   . Obesity   . Pneumonia   . Seizure ([place])   . Seizure disorder - followed by [facility] 05/23/2012  . Sleep apnea   . Tuberculosis     Medications:  No current facility-administered medications on file prior to encounter.   Current Outpatient Medications on File Prior to Encounter  Medication Sig Dispense  Refill  . atorvastatin (LIPITOR) 40 MG tablet TAKE 1 TABLET BY MOUTH EVERYDAY AT BEDTIME 30 tablet 0  . buPROPion (WELLBUTRIN XL) 150 MG 24 hr tablet Take 3 tablets (450 mg total) by mouth daily. 270 tablet 2  . canagliflozin (INVOKANA) 300 MG TABS tablet Take 1 tablet (300 mg total) by mouth daily. 90 tablet 3  . enoxaparin (LOVENOX) 120 MG/0.8ML injection Inject 0.8 mLs (120 mg total) into the skin every 12 (twelve) hours. 48 mL 5  . hydrOXYzine (ATARAX/VISTARIL) 50 MG tablet TAKE 1 TABLET (50 MG TOTAL) BY MOUTH 2 (TWO) TIMES DAILY AS NEEDED FOR ANXIETY. 180 tablet 1  . lamoTRIgine (LAMICTAL) 200 MG tablet Take 1 tablet (200 mg total) by mouth at bedtime. 90 tablet 0  . lidocaine (LIDODERM) 5 % Place 1 patch onto the skin daily. Remove & Discard patch within 12 hours or as directed by MD 30 patch 0  . LORazepam (ATIVAN) 0.5 MG tablet Take 1 tablet (0.5 mg total) by mouth daily as needed for anxiety. 30 tablet 2  . metFORMIN (GLUCOPHAGE-XR) 500 MG 24 hr tablet Take 1 tablet (500 mg total) by mouth daily. 90 tablet 3  . naproxen (NAPROSYN) 500 MG tablet Take 1 tablet (500 mg total) by mouth 2 (two) times daily. 30 tablet 0  .  prazosin (MINIPRESS) 5 MG capsule Take 1 capsule (5 mg total) by mouth at bedtime. 90 capsule 0  . repaglinide (PRANDIN) 2 MG tablet Take 1 tablet (2 mg total) by mouth 3 (three) times daily before meals. 270 tablet 4  . Semaglutide (RYBELSUS) 3 MG TABS Take 3 mg by mouth daily. 90 tablet 3  . traZODone (DESYREL) 50 MG tablet TAKE 1 TABLET BY MOUTH AT BEDTIME AS NEEDED AND MAY REPEAT DOSE ONE TIME IF NEEDED FOR SLEEP. 180 tablet 1  . vortioxetine HBr (TRINTELLIX) 20 MG TABS tablet Take 1 tablet (20 mg total) by mouth daily. 30 tablet 2     Assessment: 50 y.o. male with h/o DVT on Lovenox, admitted with recurrent DVT, for heparin.  Last dose of Lovenox was 4/20  Goal of Therapy:  Heparin level 0.3-0.7 units/ml Monitor platelets by anticoagulation protocol: Yes   Plan:   Heparin 5000 units IV bolus, then start heparin 1800 units/hr Check heparin level in 6 hours.   Caryl Pina 11/21/2020,1:24 AM

## 2020-11-21 NOTE — Discharge Summary (Signed)
Physician Discharge Summary  Bryan Mcmillan SAY:[id number] DOB: 1970-09-13 DOA: 11/20/2020  PCP: Orson Slick, MD  Admit date: 11/20/2020 Discharge date: 11/21/2020  Admitted From: Home Disposition:  Home  Recommendations for Outpatient Follow-up:  1. Follow up with PCP in 2-3 weeks 2. Follow up with Vascular Surgery as scheduled for thrombectomy  Discharge Condition:Stable CODE STATUS:Full Diet recommendation: Diabetic   Brief/Interim Summary: 50 y.o. male with medical history significant for type 2 diabetes mellitus, depression, anxiety, and history of DVT and PE, now presenting to the emergency department for evaluation of left leg pain, swelling, and redness.  Patient reports history of multiple lower extremity DVTs, more often on the left, PE, currently managed with Lovenox, and development over the past 1 week of left lower extremity swelling and increasing pain.  He then developed some erythema over the lower left leg anteriorly in the past day.  He denies any chest pain, shortness of breath, or hemoptysis.  He reports development of a DVT while therapeutic on warfarin, also reports history of VTE while on Xarelto, has history of IVC filter which has since been removed, and has most recently been managed with Lovenox.  He missed a few days of Lovenox 3 or 4 weeks ago but not since.   Discharge Diagnoses:  Principal Problem:   Acute deep vein thrombosis (DVT) of left lower extremity (HCC) Active Problems:   Uncontrolled type 2 diabetes mellitus (HCC)   Major depressive disorder, recurrent episode, mild (HCC)   GAD (generalized anxiety disorder)   1. Acute left leg DVT with cellulitis - Patient with history of multiple LE DVTs and PE currently on Lovenox presents with 1 wk of LLE swelling and pain and 1 day of erythema  - US reveals DVT involving left common femoral, superficial femoral, and popliteal veins  - Foot is warm with intact pulses; no evidence for PE  -  IV heparin was initially started in ED - Vascular surgery consulted, recommendation for thrombectomy as outpatient, OK to d/c today per Vascular Surgery -area of erythema over LLE. Will cover with doxycycline x 14 days - Pt to f/u with hematology and vascular surgery    2. Type II DM  - A1c was 12.6% in March 2022  - continued on SSI while in hospital  3. Depression, anxiety, insomnia  - Continue Wellbutrin, Lamictal, Prazosin, as-needed Ativan, and as-needed trazodone    Discharge Instructions   Allergies as of 11/21/2020      Reactions   Norco [hydrocodone-acetaminophen] Other (See Comments)   Paralysis-everything but breathing   Penicillins Other (See Comments)   Did it involve swelling of the face/tongue/throat, SOB, or low BP? N/A Did it involve sudden or severe rash/hives, skin peeling, or any reaction on the inside of your mouth or nose? N/A Did you need to seek medical attention at a hospital or doctor's office? N/A When did it last happen?Child  If all above answers are "NO", may proceed with cephalosporin use.      Medication List    STOP taking these medications   atorvastatin 40 MG tablet Commonly known as: LIPITOR   lidocaine 5 % Commonly known as: Lidoderm   naproxen 500 MG tablet Commonly known as: NAPROSYN     TAKE these medications   buPROPion 150 MG 24 hr tablet Commonly known as: WELLBUTRIN XL Take 3 tablets (450 mg total) by mouth daily.   canagliflozin 300 MG Tabs tablet Commonly known as: Invokana Take 1 tablet (300 mg total)  by mouth daily.   enoxaparin 120 MG/0.8ML injection Commonly known as: Lovenox Inject 0.8 mLs (120 mg total) into the skin every 12 (twelve) hours.   hydrOXYzine 50 MG tablet Commonly known as: ATARAX/VISTARIL TAKE 1 TABLET (50 MG TOTAL) BY MOUTH 2 (TWO) TIMES DAILY AS NEEDED FOR ANXIETY.   lamoTRIgine 200 MG tablet Commonly known as: LAMICTAL Take 1 tablet (200 mg total) by mouth at bedtime.   LORazepam 0.5  MG tablet Commonly known as: ATIVAN Take 1 tablet (0.5 mg total) by mouth daily as needed for anxiety.   metFORMIN 500 MG 24 hr tablet Commonly known as: GLUCOPHAGE-XR Take 1 tablet (500 mg total) by mouth daily.   prazosin 5 MG capsule Commonly known as: MINIPRESS Take 1 capsule (5 mg total) by mouth at bedtime.   repaglinide 2 MG tablet Commonly known as: PRANDIN Take 1 tablet (2 mg total) by mouth 3 (three) times daily before meals.   Rybelsus 3 MG Tabs Generic drug: Semaglutide Take 3 mg by mouth daily.   traZODone 50 MG tablet Commonly known as: DESYREL TAKE 1 TABLET BY MOUTH AT BEDTIME AS NEEDED AND MAY REPEAT DOSE ONE TIME IF NEEDED FOR SLEEP. What changed: See the new instructions.   vortioxetine HBr 20 MG Tabs tablet Commonly known as: TRINTELLIX Take 1 tablet (20 mg total) by mouth daily.       Follow-up Information    Orson Slick, MD. Schedule an appointment as soon as possible for a visit in 2 week(s).   Specialty: Hematology and Oncology Contact information: [place] [address] [phone]        Serafina Mitchell, MD Follow up.   Specialties: Vascular Surgery, Cardiology Why: as scheduled Contact information: [address] [place] [address] [phone]              Allergies  Allergen Reactions  . Norco [Hydrocodone-Acetaminophen] Other (See Comments)    Paralysis-everything but breathing  . Penicillins Other (See Comments)    Did it involve swelling of the face/tongue/throat, SOB, or low BP? N/A Did it involve sudden or severe rash/hives, skin peeling, or any reaction on the inside of your mouth or nose? N/A Did you need to seek medical attention at a hospital or doctor's office? N/A When did it last happen?Child  If all above answers are "NO", may proceed with cephalosporin use.    Consultations:  Vascular Surgery  Procedures/Studies: DG Chest 2 View  Result Date: 11/21/2020 CLINICAL DATA:  Leg  swelling and rash, history of DVT EXAM: CHEST - 2 VIEW COMPARISON:  Radiograph 06/04/2019, CT 04/07/2013 FINDINGS: No consolidation, features of edema, pneumothorax, or effusion. Pulmonary vascularity is normally distributed. The cardiomediastinal contours are unremarkable. No acute osseous or soft tissue abnormality. Telemetry leads overlie the chest. IMPRESSION: No acute cardiopulmonary abnormality. Electronically Signed   By: Lovena Le M.D.   On: 11/21/2020 00:06   US Venous Img Lower Unilateral Left  Result Date: 11/21/2020 CLINICAL DATA:  Leg swelling EXAM: LEFT LOWER EXTREMITY VENOUS DOPPLER ULTRASOUND TECHNIQUE: Gray-scale sonography with graded compression, as well as color Doppler and duplex ultrasound were performed to evaluate the lower extremity deep venous systems from the level of the common femoral vein and including the common femoral, femoral, profunda femoral, popliteal and calf veins including the posterior tibial, peroneal and gastrocnemius veins when visible. The superficial great saphenous vein was also interrogated. Spectral Doppler was utilized to evaluate flow at rest and with distal augmentation maneuvers  in the common femoral, femoral and popliteal veins. COMPARISON:  None. FINDINGS: Contralateral Common Femoral Vein: Respiratory phasicity is normal and symmetric with the symptomatic side. No evidence of thrombus. Normal compressibility. Common Femoral Vein: Thrombus is noted with decreased compressibility. Saphenofemoral Junction: No evidence of thrombus. Normal compressibility and flow on color Doppler imaging. Profunda Femoral Vein: No evidence of thrombus. Normal compressibility and flow on color Doppler imaging. Femoral Vein: Thrombus is noted with decreased compressibility. Popliteal Vein: Thrombus is noted with decreased compressibility. Calf Veins: No evidence of thrombus. Normal compressibility and flow on color Doppler imaging. Superficial Great Saphenous Vein: No evidence  of thrombus. Normal compressibility. Venous Reflux:  None. Other Findings:  None. IMPRESSION: Left lower extremity deep venous thrombosis is noted involving the common femoral, superficial femoral and popliteal veins. Electronically Signed   By: Inez Catalina M.D.   On: 11/21/2020 00:53     Subjective: Continues with LLE swelling and discomfort  Discharge Exam: Vitals:   11/21/20 1054 11/21/20 1625  BP: 112/81 111/72  Pulse: 73 72  Resp: 18 20  Temp: 98.6 F (37 C) 98.6 F (37 C)  SpO2: 98% 96%   Vitals:   11/21/20 0607 11/21/20 0812 11/21/20 1054 11/21/20 1625  BP: 113/73 114/74 112/81 111/72  Pulse: 71 70 73 72  Resp: 19 18 18 20   Temp: 98.1 F (36.7 C) 98.1 F (36.7 C) 98.6 F (37 C) 98.6 F (37 C)  TempSrc: Oral Oral Oral Oral  SpO2: 93% 94% 98% 96%  Weight:      Height:        General: Pt is alert, awake, not in acute distress Cardiovascular: RRR, S1/S2 +, no rubs, no gallops Respiratory: CTA bilaterally, no wheezing, no rhonchi Abdominal: Soft, NT, ND, bowel sounds + Extremities: no edema, no cyanosis   The results of significant diagnostics from this hospitalization (including imaging, microbiology, ancillary and laboratory) are listed below for reference.     Microbiology: Recent Results (from the past 240 hour(s))  Resp Panel by RT-PCR (Flu A&B, Covid) Nasopharyngeal Swab     Status: None   Collection Time: 11/21/20  1:38 AM   Specimen: Nasopharyngeal Swab; Nasopharyngeal(NP) swabs in vial transport medium  Result Value Ref Range Status   SARS Coronavirus 2 by RT PCR NEGATIVE NEGATIVE Final    Comment: (NOTE) SARS-CoV-2 target nucleic acids are NOT DETECTED.  The SARS-CoV-2 RNA is generally detectable in upper respiratory specimens during the acute phase of infection. The lowest concentration of SARS-CoV-2 viral copies this assay can detect is 138 copies/mL. A negative result does not preclude SARS-Cov-2 infection and should not be used as the sole  basis for treatment or other patient management decisions. A negative result may occur with  improper specimen collection/handling, submission of specimen other than nasopharyngeal swab, presence of viral mutation(s) within the areas targeted by this assay, and inadequate number of viral copies(<138 copies/mL). A negative result must be combined with clinical observations, patient history, and epidemiological information. The expected result is Negative.  Fact Sheet for Patients:  EntrepreneurPulse.com.au  Fact Sheet for Healthcare Providers:  IncredibleEmployment.be  This test is no t yet approved or cleared by the [place] FDA and  has been authorized for detection and/or diagnosis of SARS-CoV-2 by FDA under an Emergency Use Authorization (EUA). This EUA will remain  in effect (meaning this test can be used) for the duration of the COVID-19 declaration under Section 564(b)(1) of the Act, 21 U.S.C.section 360bbb-3(b)(1), unless the authorization is terminated  or revoked sooner.       Influenza A by PCR NEGATIVE NEGATIVE Final   Influenza B by PCR NEGATIVE NEGATIVE Final    Comment: (NOTE) The Xpert Xpress SARS-CoV-2/FLU/RSV plus assay is intended as an aid in the diagnosis of influenza from Nasopharyngeal swab specimens and should not be used as a sole basis for treatment. Nasal washings and aspirates are unacceptable for Xpert Xpress SARS-CoV-2/FLU/RSV testing.  Fact Sheet for Patients: EntrepreneurPulse.com.au  Fact Sheet for Healthcare Providers: IncredibleEmployment.be  This test is not yet approved or cleared by the [place] FDA and has been authorized for detection and/or diagnosis of SARS-CoV-2 by FDA under an Emergency Use Authorization (EUA). This EUA will remain in effect (meaning this test can be used) for the duration of the COVID-19 declaration under Section 564(b)(1) of the Act,  21 U.S.C. section 360bbb-3(b)(1), unless the authorization is terminated or revoked.  Performed at [facility]      Labs: BNP (last 3 results) No results for input(s): BNP in the last 8760 hours. Basic Metabolic Panel: Recent Labs  Lab 11/21/20 0005  NA 138  K 3.7  CL 104  CO2 25  GLUCOSE 148*  BUN 18  CREATININE 1.00  CALCIUM 9.5   Liver Function Tests: Recent Labs  Lab 11/21/20 0005  AST 27  ALT 53*  ALKPHOS 54  BILITOT 0.3  PROT 7.0  ALBUMIN 4.3   No results for input(s): LIPASE, AMYLASE in the last 168 hours. No results for input(s): AMMONIA in the last 168 hours. CBC: Recent Labs  Lab 11/20/20 2311  WBC 12.4*  NEUTROABS 9.2*  HGB 13.8  HCT 39.7  MCV 83.6  PLT 299   Cardiac Enzymes: No results for input(s): CKTOTAL, CKMB, CKMBINDEX, TROPONINI in the last 168 hours. BNP: Invalid input(s): POCBNP CBG: Recent Labs  Lab 11/21/20 0810 11/21/20 1246 11/21/20 1629  GLUCAP 140* 83 163*   D-Dimer No results for input(s): DDIMER in the last 72 hours. Hgb A1c No results for input(s): HGBA1C in the last 72 hours. Lipid Profile No results for input(s): CHOL, HDL, LDLCALC, TRIG, CHOLHDL, LDLDIRECT in the last 72 hours. Thyroid function studies No results for input(s): TSH, T4TOTAL, T3FREE, THYROIDAB in the last 72 hours.  Invalid input(s): FREET3 Anemia work up No results for input(s): VITAMINB12, FOLATE, FERRITIN, TIBC, IRON, RETICCTPCT in the last 72 hours. Urinalysis    Component Value Date/Time   COLORURINE YELLOW 07/11/2013 2126   APPEARANCEUR CLEAR 07/11/2013 2126   LABSPEC 1.039 (H) 07/11/2013 2126   PHURINE 5.0 07/11/2013 2126   GLUCOSEU >1000 (A) 07/11/2013 2126   HGBUR NEGATIVE 07/11/2013 2126   BILIRUBINUR SMALL (A) 07/11/2013 2126   KETONESUR 15 (A) 07/11/2013 2126   PROTEINUR 30 (A) 07/11/2013 2126   UROBILINOGEN 0.2 07/11/2013 2126   NITRITE NEGATIVE 07/11/2013 2126   LEUKOCYTESUR NEGATIVE 07/11/2013 2126    Sepsis Labs Invalid input(s): PROCALCITONIN,  WBC,  LACTICIDVEN Microbiology Recent Results (from the past 240 hour(s))  Resp Panel by RT-PCR (Flu A&B, Covid) Nasopharyngeal Swab     Status: None   Collection Time: 11/21/20  1:38 AM   Specimen: Nasopharyngeal Swab; Nasopharyngeal(NP) swabs in vial transport medium  Result Value Ref Range Status   SARS Coronavirus 2 by RT PCR NEGATIVE NEGATIVE Final    Comment: (NOTE) SARS-CoV-2 target nucleic acids are NOT DETECTED.  The SARS-CoV-2 RNA is generally detectable in upper respiratory specimens during the acute phase of infection. The lowest concentration of SARS-CoV-2 viral copies  this assay can detect is 138 copies/mL. A negative result does not preclude SARS-Cov-2 infection and should not be used as the sole basis for treatment or other patient management decisions. A negative result may occur with  improper specimen collection/handling, submission of specimen other than nasopharyngeal swab, presence of viral mutation(s) within the areas targeted by this assay, and inadequate number of viral copies(<138 copies/mL). A negative result must be combined with clinical observations, patient history, and epidemiological information. The expected result is Negative.  Fact Sheet for Patients:  EntrepreneurPulse.com.au  Fact Sheet for Healthcare Providers:  IncredibleEmployment.be  This test is no t yet approved or cleared by the [place] FDA and  has been authorized for detection and/or diagnosis of SARS-CoV-2 by FDA under an Emergency Use Authorization (EUA). This EUA will remain  in effect (meaning this test can be used) for the duration of the COVID-19 declaration under Section 564(b)(1) of the Act, 21 U.S.C.section 360bbb-3(b)(1), unless the authorization is terminated  or revoked sooner.       Influenza A by PCR NEGATIVE NEGATIVE Final   Influenza B by PCR NEGATIVE NEGATIVE Final     Comment: (NOTE) The Xpert Xpress SARS-CoV-2/FLU/RSV plus assay is intended as an aid in the diagnosis of influenza from Nasopharyngeal swab specimens and should not be used as a sole basis for treatment. Nasal washings and aspirates are unacceptable for Xpert Xpress SARS-CoV-2/FLU/RSV testing.  Fact Sheet for Patients: EntrepreneurPulse.com.au  Fact Sheet for Healthcare Providers: IncredibleEmployment.be  This test is not yet approved or cleared by the [place] FDA and has been authorized for detection and/or diagnosis of SARS-CoV-2 by FDA under an Emergency Use Authorization (EUA). This EUA will remain in effect (meaning this test can be used) for the duration of the COVID-19 declaration under Section 564(b)(1) of the Act, 21 U.S.C. section 360bbb-3(b)(1), unless the authorization is terminated or revoked.  Performed at [facility]    Time spent: 30 min  SIGNED:   Marylu Lund, MD  [facility] 11/21/2020, 4:47 PM  If 7PM-7AM, please contact night-coverage

## 2020-11-21 NOTE — ED Notes (Signed)
Patient returned from Radiology. 

## 2020-11-21 NOTE — Progress Notes (Signed)
[place] for Heparin Indication: DVT  Allergies  Allergen Reactions  . Norco [Hydrocodone-Acetaminophen] Other (See Comments)    Paralysis-everything but breathing  . Penicillins Other (See Comments)    Did it involve swelling of the face/tongue/throat, SOB, or low BP? N/A Did it involve sudden or severe rash/hives, skin peeling, or any reaction on the inside of your mouth or nose? N/A Did you need to seek medical attention at a hospital or doctor's office? N/A When did it last happen?Child  If all above answers are "NO", may proceed with cephalosporin use.    Patient Measurements: Height: 5\' 11"  (180.3 cm) Weight: 111.1 kg (245 lb) IBW/kg (Calculated) : 75.3 Heparin Dosing Weight: 100  Vital Signs: Temp: 98.6 F (37 C) (04/22 1054) Temp Source: Oral (04/22 1054) BP: 112/81 (04/22 1054) Pulse Rate: 73 (04/22 1054)  Labs: Recent Labs    11/20/20 2311 11/21/20 0005 11/21/20 0851  HGB 13.8  --   --   HCT 39.7  --   --   PLT 299  --   --   APTT 29  --   --   LABPROT 13.5  --   --   INR 1.0  --   --   HEPARINUNFRC  --   --  0.64  CREATININE  --  1.00  --   TROPONINIHS <2  --   --     Estimated Creatinine Clearance: 113.2 mL/min (by C-G formula based on SCr of 1 mg/dL).   Medical History: Past Medical History:  Diagnosis Date  . Anxiety   . Asthma   . Depression   . Diabetes ([place])   . DVT (deep venous thrombosis), hx of recurrent 05/23/2012  . Headache(784.0)    frequent  . History of blood clot in brain, 2012 - followed by [facility] 05/23/2012  . History of blood clots   . History of suicidal tendencies   . Hyperlipemia 08/25/2012  . Hyperlipidemia   . Kidney disease   . Kidney stones   . Migraines   . Obesity   . Pneumonia   . Seizure ([place])   . Seizure disorder - followed by [facility] 05/23/2012  . Sleep apnea   . Tuberculosis     Medications:  No current facility-administered medications on file  prior to encounter.   Current Outpatient Medications on File Prior to Encounter  Medication Sig Dispense Refill  . buPROPion (WELLBUTRIN XL) 150 MG 24 hr tablet Take 3 tablets (450 mg total) by mouth daily. 270 tablet 2  . canagliflozin (INVOKANA) 300 MG TABS tablet Take 1 tablet (300 mg total) by mouth daily. 90 tablet 3  . enoxaparin (LOVENOX) 120 MG/0.8ML injection Inject 0.8 mLs (120 mg total) into the skin every 12 (twelve) hours. 48 mL 5  . hydrOXYzine (ATARAX/VISTARIL) 50 MG tablet TAKE 1 TABLET (50 MG TOTAL) BY MOUTH 2 (TWO) TIMES DAILY AS NEEDED FOR ANXIETY. 180 tablet 1  . lamoTRIgine (LAMICTAL) 200 MG tablet Take 1 tablet (200 mg total) by mouth at bedtime. 90 tablet 0  . LORazepam (ATIVAN) 0.5 MG tablet Take 1 tablet (0.5 mg total) by mouth daily as needed for anxiety. 30 tablet 2  . metFORMIN (GLUCOPHAGE-XR) 500 MG 24 hr tablet Take 1 tablet (500 mg total) by mouth daily. 90 tablet 3  . prazosin (MINIPRESS) 5 MG capsule Take 1 capsule (5 mg total) by mouth at bedtime. 90 capsule 0  . repaglinide (PRANDIN) 2 MG tablet Take 1  tablet (2 mg total) by mouth 3 (three) times daily before meals. 270 tablet 4  . Semaglutide (RYBELSUS) 3 MG TABS Take 3 mg by mouth daily. 90 tablet 3  . traZODone (DESYREL) 50 MG tablet TAKE 1 TABLET BY MOUTH AT BEDTIME AS NEEDED AND MAY REPEAT DOSE ONE TIME IF NEEDED FOR SLEEP. (Patient taking differently: Take 50 mg by mouth at bedtime as needed for sleep (may repeat dose one time if needed).) 180 tablet 1  . vortioxetine HBr (TRINTELLIX) 20 MG TABS tablet Take 1 tablet (20 mg total) by mouth daily. 30 tablet 2  . atorvastatin (LIPITOR) 40 MG tablet TAKE 1 TABLET BY MOUTH EVERYDAY AT BEDTIME (Patient not taking: Reported on 11/21/2020) 30 tablet 0  . lidocaine (LIDODERM) 5 % Place 1 patch onto the skin daily. Remove & Discard patch within 12 hours or as directed by MD (Patient not taking: Reported on 11/21/2020) 30 patch 0  . naproxen (NAPROSYN) 500 MG tablet Take  1 tablet (500 mg total) by mouth 2 (two) times daily. (Patient not taking: Reported on 11/21/2020) 30 tablet 0     Assessment: 50 y.o. male with h/o DVT on Lovenox, admitted with recurrent DVT, on heparin.  Last dose of Lovenox was 4/20. He follows with hematology and had recurrent VTE on warfarin and Xarelto -heparin level at goal -hg= 12.8  Goal of Therapy:  Heparin level 0.3-0.7 units/ml Monitor platelets by anticoagulation protocol: Yes   Plan:  -Continue heparin at 1800 units/hr -Recheck heparin level today -Daily heparin level and CBC  Hildred Laser, PharmD Clinical Pharmacist **Pharmacist phone directory can now be found on [email or web address] ([email or web address]).  Listed under [place].

## 2020-11-21 NOTE — ED Notes (Signed)
Patient transported to Radiology at this time.

## 2020-11-21 NOTE — Progress Notes (Signed)
Discharge orders received.  IV and telemetry removed.  CCMD notified.  Pt's hematologist in and plans to order po abx for home for suspected cellulitis in LLE.  He will contact primary MD to discuss but pt should still be cleared for discharge.  Pt's brother in law will be mode of transport to home.

## 2020-11-24 ENCOUNTER — Telehealth: Payer: Self-pay | Admitting: Hematology and Oncology

## 2020-11-24 NOTE — Telephone Encounter (Signed)
Scheduled appt per 4/22 sch msg. Pt aware.  

## 2020-11-25 ENCOUNTER — Inpatient Hospital Stay (HOSPITAL_COMMUNITY)
Admission: AD | Admit: 2020-11-25 | Discharge: 2020-11-29 | DRG: 270 | Disposition: A | Discharge status: Home / Self Care | Payer: 59 | Source: Ambulatory Visit | Attending: Surgery | Admitting: Surgery

## 2020-11-25 ENCOUNTER — Inpatient Hospital Stay (HOSPITAL_COMMUNITY)
Admission: AD | Disposition: A | Discharge status: Home / Self Care | Payer: Self-pay | Source: Ambulatory Visit | Attending: Surgery

## 2020-11-25 DIAGNOSIS — F419 Anxiety disorder, unspecified: Secondary | ICD-10-CM | POA: Diagnosis present

## 2020-11-25 DIAGNOSIS — I82402 Acute embolism and thrombosis of unspecified deep veins of left lower extremity: Secondary | ICD-10-CM | POA: Diagnosis not present

## 2020-11-25 DIAGNOSIS — Z79899 Other long term (current) drug therapy: Secondary | ICD-10-CM

## 2020-11-25 DIAGNOSIS — K219 Gastro-esophageal reflux disease without esophagitis: Secondary | ICD-10-CM | POA: Diagnosis present

## 2020-11-25 DIAGNOSIS — E119 Type 2 diabetes mellitus without complications: Secondary | ICD-10-CM | POA: Diagnosis present

## 2020-11-25 DIAGNOSIS — G40909 Epilepsy, unspecified, not intractable, without status epilepticus: Secondary | ICD-10-CM | POA: Diagnosis present

## 2020-11-25 DIAGNOSIS — G4730 Sleep apnea, unspecified: Secondary | ICD-10-CM | POA: Diagnosis present

## 2020-11-25 DIAGNOSIS — Z86718 Personal history of other venous thrombosis and embolism: Secondary | ICD-10-CM

## 2020-11-25 DIAGNOSIS — Z86711 Personal history of pulmonary embolism: Secondary | ICD-10-CM

## 2020-11-25 DIAGNOSIS — Z885 Allergy status to narcotic agent status: Secondary | ICD-10-CM

## 2020-11-25 DIAGNOSIS — I82432 Acute embolism and thrombosis of left popliteal vein: Principal | ICD-10-CM | POA: Diagnosis present

## 2020-11-25 DIAGNOSIS — I82422 Acute embolism and thrombosis of left iliac vein: Secondary | ICD-10-CM | POA: Diagnosis present

## 2020-11-25 DIAGNOSIS — I82409 Acute embolism and thrombosis of unspecified deep veins of unspecified lower extremity: Secondary | ICD-10-CM | POA: Diagnosis present

## 2020-11-25 DIAGNOSIS — Z880 Allergy status to penicillin: Secondary | ICD-10-CM

## 2020-11-25 DIAGNOSIS — E785 Hyperlipidemia, unspecified: Secondary | ICD-10-CM | POA: Diagnosis present

## 2020-11-25 DIAGNOSIS — I2699 Other pulmonary embolism without acute cor pulmonale: Secondary | ICD-10-CM | POA: Diagnosis not present

## 2020-11-25 DIAGNOSIS — Z833 Family history of diabetes mellitus: Secondary | ICD-10-CM

## 2020-11-25 DIAGNOSIS — F32A Depression, unspecified: Secondary | ICD-10-CM | POA: Diagnosis present

## 2020-11-25 DIAGNOSIS — I82412 Acute embolism and thrombosis of left femoral vein: Secondary | ICD-10-CM | POA: Diagnosis present

## 2020-11-25 DIAGNOSIS — J45909 Unspecified asthma, uncomplicated: Secondary | ICD-10-CM | POA: Diagnosis present

## 2020-11-25 DIAGNOSIS — Z8249 Family history of ischemic heart disease and other diseases of the circulatory system: Secondary | ICD-10-CM

## 2020-11-25 DIAGNOSIS — Z83438 Family history of other disorder of lipoprotein metabolism and other lipidemia: Secondary | ICD-10-CM

## 2020-11-25 DIAGNOSIS — Z7984 Long term (current) use of oral hypoglycemic drugs: Secondary | ICD-10-CM

## 2020-11-25 HISTORY — PX: PERIPHERAL VASCULAR THROMBECTOMY: CATH118306

## 2020-11-25 LAB — GLUCOSE, CAPILLARY
Glucose-Capillary: 84 mg/dL (ref 70–99)
Glucose-Capillary: 130 mg/dL — ABNORMAL HIGH (ref 70–99)

## 2020-11-25 LAB — POCT I-STAT, CHEM 8
BUN: 20 mg/dL (ref 6–20)
Calcium, Ion: 1.22 mmol/L (ref 1.15–1.40)
Chloride: 106 mmol/L (ref 98–111)
Creatinine, Ser: 0.8 mg/dL (ref 0.61–1.24)
Glucose, Bld: 88 mg/dL (ref 70–99)
HCT: 45 % (ref 39.0–52.0)
Hemoglobin: 15.3 g/dL (ref 13.0–17.0)
Potassium: 4.5 mmol/L (ref 3.5–5.1)
Sodium: 139 mmol/L (ref 135–145)
TCO2: 25 mmol/L (ref 22–32)

## 2020-11-25 LAB — HEPARIN LEVEL (UNFRACTIONATED): Heparin Unfractionated: 0.38 IU/mL (ref 0.30–0.70)

## 2020-11-25 SURGERY — PERIPHERAL VASCULAR THROMBECTOMY
Anesthesia: LOCAL

## 2020-11-25 MED ORDER — MIDAZOLAM HCL 2 MG/2ML IJ SOLN
INTRAMUSCULAR | Status: AC
  Filled 2020-11-25: qty 2

## 2020-11-25 MED ORDER — SODIUM CHLORIDE 0.9% FLUSH
3.0000 mL | Freq: Two times a day (BID) | INTRAVENOUS | Status: DC
  Administered 2020-11-25: 3 mL via INTRAVENOUS

## 2020-11-25 MED ORDER — LIDOCAINE HCL (PF) 1 % IJ SOLN
INTRAMUSCULAR | Status: DC | PRN
  Administered 2020-11-25: 5 mL
  Administered 2020-11-25 (×2): 10 mL

## 2020-11-25 MED ORDER — INSULIN ASPART 100 UNIT/ML ~~LOC~~ SOLN
0.0000 [IU] | Freq: Three times a day (TID) | SUBCUTANEOUS | Status: DC
  Administered 2020-11-25 – 2020-11-26 (×2): 3 [IU] via SUBCUTANEOUS
  Administered 2020-11-27 (×2): 4 [IU] via SUBCUTANEOUS
  Administered 2020-11-27: 3 [IU] via SUBCUTANEOUS
  Administered 2020-11-28: 7 [IU] via SUBCUTANEOUS
  Administered 2020-11-29: 3 [IU] via SUBCUTANEOUS

## 2020-11-25 MED ORDER — ONDANSETRON HCL 4 MG/2ML IJ SOLN
4.0000 mg | Freq: Four times a day (QID) | INTRAMUSCULAR | Status: DC | PRN
  Administered 2020-11-26: 4 mg via INTRAVENOUS
  Filled 2020-11-25: qty 2

## 2020-11-25 MED ORDER — HEPARIN SODIUM (PORCINE) 1000 UNIT/ML IJ SOLN
INTRAMUSCULAR | Status: DC | PRN
  Administered 2020-11-25: 10000 [IU] via INTRAVENOUS

## 2020-11-25 MED ORDER — HYDRALAZINE HCL 20 MG/ML IJ SOLN: 5.0000 mg | INTRAMUSCULAR | Status: DC | PRN

## 2020-11-25 MED ORDER — FENTANYL CITRATE (PF) 100 MCG/2ML IJ SOLN
INTRAMUSCULAR | Status: DC | PRN
  Administered 2020-11-25 (×3): 50 ug via INTRAVENOUS

## 2020-11-25 MED ORDER — SODIUM CHLORIDE 0.9 % WEIGHT BASED INFUSION: 1.0000 mL/kg/h | INTRAVENOUS | Status: AC

## 2020-11-25 MED ORDER — DOXYCYCLINE HYCLATE 100 MG PO TABS
100.0000 mg | ORAL_TABLET | Freq: Two times a day (BID) | ORAL | Status: DC
  Administered 2020-11-25 – 2020-11-29 (×7): 100 mg via ORAL
  Filled 2020-11-25 (×7): qty 1

## 2020-11-25 MED ORDER — HEPARIN (PORCINE) IN NACL 1000-0.9 UT/500ML-% IV SOLN
INTRAVENOUS | Status: AC
  Filled 2020-11-25: qty 500

## 2020-11-25 MED ORDER — PRAZOSIN HCL 2 MG PO CAPS
5.0000 mg | ORAL_CAPSULE | Freq: Every day | ORAL | Status: DC
  Administered 2020-11-25 – 2020-11-28 (×4): 5 mg via ORAL
  Filled 2020-11-25 (×6): qty 1

## 2020-11-25 MED ORDER — LORAZEPAM 0.5 MG PO TABS: 0.5000 mg | ORAL_TABLET | Freq: Every day | ORAL | Status: DC | PRN

## 2020-11-25 MED ORDER — TRAZODONE HCL 50 MG PO TABS: 50.0000 mg | ORAL_TABLET | Freq: Every evening | ORAL | Status: DC | PRN

## 2020-11-25 MED ORDER — IODIXANOL 320 MG/ML IV SOLN
INTRAVENOUS | Status: DC | PRN
  Administered 2020-11-25: 10 mL via INTRA_ARTERIAL

## 2020-11-25 MED ORDER — SODIUM CHLORIDE 0.9% FLUSH: 3.0000 mL | INTRAVENOUS | Status: DC | PRN

## 2020-11-25 MED ORDER — HEPARIN (PORCINE) 25000 UT/250ML-% IV SOLN
1750.0000 [IU]/h | INTRAVENOUS | Status: DC
  Administered 2020-11-25 – 2020-11-26 (×2): 1750 [IU]/h via INTRAVENOUS
  Administered 2020-11-26: 2000 [IU]/h via INTRAVENOUS
  Administered 2020-11-27: 1850 [IU]/h via INTRAVENOUS
  Administered 2020-11-27: 1900 [IU]/h via INTRAVENOUS
  Administered 2020-11-28: 1850 [IU]/h via INTRAVENOUS
  Filled 2020-11-25 (×8): qty 250

## 2020-11-25 MED ORDER — LIDOCAINE HCL (PF) 1 % IJ SOLN
INTRAMUSCULAR | Status: AC
  Filled 2020-11-25: qty 30

## 2020-11-25 MED ORDER — MIDAZOLAM HCL 2 MG/2ML IJ SOLN
INTRAMUSCULAR | Status: DC | PRN
  Administered 2020-11-25: 1 mg via INTRAVENOUS
  Administered 2020-11-25: 2 mg via INTRAVENOUS

## 2020-11-25 MED ORDER — VORTIOXETINE HBR 20 MG PO TABS
20.0000 mg | ORAL_TABLET | Freq: Every day | ORAL | Status: DC
Start: 2020-11-26 — End: 2020-11-29
  Administered 2020-11-26 – 2020-11-29 (×3): 20 mg via ORAL
  Filled 2020-11-25 (×4): qty 1

## 2020-11-25 MED ORDER — HEPARIN SODIUM (PORCINE) 1000 UNIT/ML IJ SOLN
INTRAMUSCULAR | Status: AC
  Filled 2020-11-25: qty 1

## 2020-11-25 MED ORDER — BUPROPION HCL ER (XL) 300 MG PO TB24
450.0000 mg | ORAL_TABLET | Freq: Every day | ORAL | Status: DC
  Administered 2020-11-26 – 2020-11-29 (×3): 450 mg via ORAL
  Filled 2020-11-25 (×3): qty 1

## 2020-11-25 MED ORDER — FENTANYL CITRATE (PF) 100 MCG/2ML IJ SOLN
INTRAMUSCULAR | Status: AC
  Filled 2020-11-25: qty 2

## 2020-11-25 MED ORDER — SODIUM CHLORIDE 0.9 % IV SOLN: 250.0000 mL | INTRAVENOUS | Status: DC | PRN

## 2020-11-25 MED ORDER — LABETALOL HCL 5 MG/ML IV SOLN: 10.0000 mg | INTRAVENOUS | Status: DC | PRN

## 2020-11-25 MED ORDER — MORPHINE SULFATE (PF) 4 MG/ML IV SOLN
2.0000 mg | INTRAVENOUS | Status: DC | PRN
  Administered 2020-11-28: 2 mg via INTRAVENOUS
  Filled 2020-11-25 (×2): qty 1

## 2020-11-25 MED ORDER — LAMOTRIGINE 100 MG PO TABS
200.0000 mg | ORAL_TABLET | Freq: Every day | ORAL | Status: DC
  Administered 2020-11-26 – 2020-11-29 (×3): 200 mg via ORAL
  Filled 2020-11-25: qty 8
  Filled 2020-11-25 (×3): qty 2
  Filled 2020-11-25 (×2): qty 8
  Filled 2020-11-25 (×2): qty 2

## 2020-11-25 MED ORDER — SODIUM CHLORIDE 0.9 % IV SOLN: INTRAVENOUS | Status: DC

## 2020-11-25 SURGICAL SUPPLY — 13 items
BAG SNAP BAND KOVER 36X36 (MISCELLANEOUS) ×2 IMPLANT
CATH ANGIO 5F BER2 100CM (CATHETERS) ×2 IMPLANT
COVER DOME SNAP 22 D (MISCELLANEOUS) ×2 IMPLANT
GLIDEWIRE ADV .035X260CM (WIRE) ×2 IMPLANT
GLIDEWIRE NITREX 0.018X80X5 (WIRE) ×2
GUIDEWIRE NITREX 0.018X80X5 (WIRE) ×2 IMPLANT
KIT MICROPUNCTURE NIT STIFF (SHEATH) ×2 IMPLANT
PROTECTION STATION PRESSURIZED (MISCELLANEOUS) ×2
SHEATH PINNACLE 8F 10CM (SHEATH) ×2 IMPLANT
SHEATH PROBE COVER 6X72 (BAG) ×4 IMPLANT
SHIELD RADPAD SCOOP 12X17 (MISCELLANEOUS) ×2 IMPLANT
STATION PROTECTION PRESSURIZED (MISCELLANEOUS) ×1 IMPLANT
TRAY PV CATH (CUSTOM PROCEDURE TRAY) ×2 IMPLANT

## 2020-11-25 NOTE — Interval H&P Note (Signed)
History and Physical Interval Note:  11/25/2020 10:39 AM  Bryan Mcmillan  has presented today for surgery, with the diagnosis of dvt.  The various methods of treatment have been discussed with the patient and family. After consideration of risks, benefits and other options for treatment, the patient has consented to  Procedure(s): PERIPHERAL VASCULAR THROMBECTOMY (N/A) as a surgical intervention.  The patient's history has been reviewed, patient examined, no change in status, stable for surgery.  I have reviewed the patient's chart and labs.  Questions were answered to the patient's satisfaction.     [name]

## 2020-11-25 NOTE — Op Note (Signed)
    Patient name: Bryan Mcmillan MRN: [id number] DOB: [birth year] Sex: male  11/25/2020 Pre-operative Diagnosis: Left leg DVT Post-operative diagnosis:  Same Surgeon:  [name] Procedure Performed:  1.  Ultrasound-guided access, left popliteal vein  2.  Left leg venogram  3.  Conscious sedation, 115 minutes     Indications: This is a 50 year old gentleman with multiple prior bilateral lower extremity DVTs as well as PEs.  He presented to the emergency department last week with new onset left leg swelling.  Ultrasound was consistent with DVT.  He comes in today for further evaluation and possible intervention  Procedure:  The patient was identified in the holding area and taken to room 8.  The patient was then placed supine on the table and prepped and draped in the usual sterile fashion.  A time out was called.  Conscious sedation was administered with the use of IV fentanyl and Versed under continuous physician and nurse monitoring.  Heart rate, blood pressure, and oxygen saturation were continuously monitored.  Total sedation time was 115 minutes.  Ultrasound was used to evaluate the left popliteal vein.  There were numerous collaterals.  I felt that I had identified the small saphenous vein, and so this was cannulated under ultrasound guidance with a micropuncture needle.  An 018 wire was then advanced followed placement of a micropuncture sheath.  A Glidewire advantage was then placed and an 8 [place] sheath was inserted.  I had difficulty advancing the wire as it looks like it wanted to go out of branch.  I performed venography through a Berenstein catheter and this appeared to be in a collateral.  I therefore removed the sheath and held manual pressure.  I then attempted to cannulate several other veins as well as the popliteal vein.  Visualization was severely limited.  The artery was cannulated on 2 separate occasions.  I then brought in the vascular lab ultrasound to try to get  better images.  Still visualization was poor.  Ultimately I was not able to cannulate the popliteal vein.  About this time, the patient began complaining of chest pain.  I bolused him 10,000 units of heparin, in case this was a PE.  I elected to stop at this time and consider other options once he is more stable.   Impression:  #1  Unsuccessful attempt at venous thrombectomy     V. [name], M.D., [facility] [place] Office: [phone] Pager:  [phone]

## 2020-11-26 ENCOUNTER — Other Ambulatory Visit: Payer: Self-pay

## 2020-11-26 ENCOUNTER — Encounter (HOSPITAL_COMMUNITY): Payer: Self-pay | Admitting: Surgery

## 2020-11-26 ENCOUNTER — Observation Stay (HOSPITAL_COMMUNITY): Payer: 59

## 2020-11-26 LAB — GLUCOSE, CAPILLARY
Glucose-Capillary: 103 mg/dL — ABNORMAL HIGH (ref 70–99)
Glucose-Capillary: 99 mg/dL (ref 70–99)
Glucose-Capillary: 109 mg/dL — ABNORMAL HIGH (ref 70–99)
Glucose-Capillary: 139 mg/dL — ABNORMAL HIGH (ref 70–99)
Glucose-Capillary: 180 mg/dL — ABNORMAL HIGH (ref 70–99)

## 2020-11-26 LAB — BASIC METABOLIC PANEL
Anion gap: 11 (ref 5–15)
BUN: 16 mg/dL (ref 6–20)
CO2: 23 mmol/L (ref 22–32)
Calcium: 9.4 mg/dL (ref 8.9–10.3)
Chloride: 103 mmol/L (ref 98–111)
Creatinine, Ser: 0.94 mg/dL (ref 0.61–1.24)
GFR, Estimated: >60 mL/min (ref >60)
Glucose, Bld: 122 mg/dL — ABNORMAL HIGH (ref 70–99)
Potassium: 3.7 mmol/L (ref 3.5–5.1)
Sodium: 137 mmol/L (ref 135–145)

## 2020-11-26 LAB — HEPARIN LEVEL (UNFRACTIONATED)
Heparin Unfractionated: 0.24 IU/mL — ABNORMAL LOW (ref 0.30–0.70)
Heparin Unfractionated: 0.63 IU/mL (ref 0.30–0.70)
Heparin Unfractionated: 0.72 IU/mL — ABNORMAL HIGH (ref 0.30–0.70)

## 2020-11-26 LAB — CBC
HCT: 41 % (ref 39.0–52.0)
Hemoglobin: 13.3 g/dL (ref 13.0–17.0)
MCH: 28 pg (ref 26.0–34.0)
MCHC: 32.4 g/dL (ref 30.0–36.0)
MCV: 86.3 fL (ref 80.0–100.0)
Platelets: 289 10*3/uL (ref 150–400)
RBC: 4.75 MIL/uL (ref 4.22–5.81)
RDW: 13 % (ref 11.5–15.5)
WBC: 8.3 10*3/uL (ref 4.0–10.5)
nRBC: 0 % (ref 0.0–0.2)

## 2020-11-26 LAB — TROPONIN I (HIGH SENSITIVITY)
Troponin I (High Sensitivity): 5 ng/L (ref <18)
Troponin I (High Sensitivity): <2 ng/L (ref <18)

## 2020-11-26 MED ORDER — IOHEXOL 350 MG/ML SOLN
100.0000 mL | Freq: Once | INTRAVENOUS | Status: AC | PRN
  Administered 2020-11-26: 100 mL via INTRAVENOUS

## 2020-11-26 MED ORDER — ONDANSETRON HCL 4 MG/2ML IJ SOLN: 4.0000 mg | Freq: Once | INTRAMUSCULAR | Status: AC

## 2020-11-26 MED FILL — Heparin Sod (Porcine)-NaCl IV Soln 1000 Unit/500ML-0.9%: INTRAVENOUS | Qty: 1000 | Status: CN

## 2020-11-26 MED FILL — Heparin Sod (Porcine)-NaCl IV Soln 1000 Unit/500ML-0.9%: INTRAVENOUS | Qty: 500 | Status: AC
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
## 2020-11-26 NOTE — Progress Notes (Addendum)
ANTICOAGULATION CONSULT NOTE - Follow Up Consult  Pharmacy Consult for IV Heparin  Indication: LLE DVT, s/p attempted thrombectomy 4/26, PE  Allergies  Allergen Reactions  . Norco [Hydrocodone-Acetaminophen] Other (See Comments)    Paralysis-everything but breathing  . Penicillins Other (See Comments)    Did it involve swelling of the face/tongue/throat, SOB, or low BP? N/A Did it involve sudden or severe rash/hives, skin peeling, or any reaction on the inside of your mouth or nose? N/A Did you need to seek medical attention at a hospital or doctor's office? N/A When did it last happen?Child  If all above answers are "NO", may proceed with cephalosporin use.    Patient Measurements: Height: 5\' 11"  (180.3 cm) Weight: 113.4 kg (250 lb) IBW/kg (Calculated) : 75.3  Heparin Dosing Weight: 100 kg  Vital Signs: Temp: 98.2 F (36.8 C) (04/27 1927) Temp Source: Oral (04/27 1927) BP: 122/77 (04/27 1927) Pulse Rate: 87 (04/27 1927)  Labs: Recent Labs    11/25/20 0929 11/25/20 2303 11/26/20 0139 11/26/20 0753 11/26/20 0942 11/26/20 1246  HGB 15.3  --  13.3  --   --   --   HCT 45.0  --  41.0  --   --   --   PLT  --   --  289  --   --   --   HEPARINUNFRC  --  0.38 0.24*  --   --  0.63  CREATININE 0.80  --  0.94  --   --   --   TROPONINIHS  --   --   --  5 <2  --     Estimated Creatinine Clearance: 121.7 mL/min (by C-G formula based on SCr of 0.94 mg/dL).   Medical History: Past Medical History:  Diagnosis Date  . Anxiety   . Asthma   . Depression   . Diabetes ([place])   . DVT (deep venous thrombosis), hx of recurrent 05/23/2012  . Headache(784.0)    frequent  . History of blood clot in brain, 2012 - followed by [facility] 05/23/2012  . History of blood clots   . History of suicidal tendencies   . Hyperlipemia 08/25/2012  . Hyperlipidemia   . Kidney disease   . Kidney stones   . Migraines   . Obesity   . Pneumonia   . Seizure ([place])   . Seizure disorder -  followed by [place] [facility] 05/23/2012  . Sleep apnea   . Tuberculosis     Assessment: 50 yr old male with LLE DVT on Lovenox PTA was admitted for attempted thrombectomy 4/26.  Pharmacy was consulted for IV heparin dosing.  Patient also with intermittent chest pain; 4/27 CT positive for RLL PE without RHS  Heparin level ~5 hrs after heparin infusion was increased to 2000 units/hr was 0.63 units/ml, which is within the goal range for this pt. H/H 13.3/41.0, plt 289. Per RN, no issues with IV or bleeding observed.  Goal of Therapy:  Heparin level 0.3-0.7 units/ml Monitor platelets by anticoagulation protocol: Yes   Plan:  Continue heparin infusion at 2000 units/hr Check confirmatory heparin level this evening Monitor daily heparin level, CBC Monitor for bleeding F/U surgical plan and transition back to enoxaparin   Gillermina Hu, PharmD, BCPS, [facility] Clinical Pharmacist 11/26/20, 20:18 PM  ADDENDUM: Confirmatory heparin level drawn ~8 hrs after initial therapeutic heparin level was 0.72 units/ml, which is just above the goal range for this pt.  No issues with IV or bleeding, per RN. Decrease heparin infusion  to 1900 units/hr, check heparin level in 6 hrs.

## 2020-11-26 NOTE — Progress Notes (Signed)
ANTICOAGULATION CONSULT NOTE - Initial Consult  Pharmacy Consult for Heparin  Indication: LLE DVT, s/p attempted thrombectomy 4/26  Allergies  Allergen Reactions  . Norco [Hydrocodone-Acetaminophen] Other (See Comments)    Paralysis-everything but breathing  . Penicillins Other (See Comments)    Did it involve swelling of the face/tongue/throat, SOB, or low BP? N/A Did it involve sudden or severe rash/hives, skin peeling, or any reaction on the inside of your mouth or nose? N/A Did you need to seek medical attention at a hospital or doctor's office? N/A When did it last happen?Child  If all above answers are "NO", may proceed with cephalosporin use.    Patient Measurements: Height: 5\' 11"  (180.3 cm) Weight: 113.4 kg (250 lb) IBW/kg (Calculated) : 75.3  Vital Signs: Temp: 98.2 F (36.8 C) (04/27 0320) Temp Source: Oral (04/27 0320) BP: 110/70 (04/27 0320) Pulse Rate: 80 (04/27 0320)  Labs: Recent Labs    11/25/20 0929 11/25/20 2303 11/26/20 0139  HGB 15.3  --  13.3  HCT 45.0  --  41.0  PLT  --   --  289  HEPARINUNFRC  --  0.38 0.24*  CREATININE 0.80  --  0.94    Estimated Creatinine Clearance: 121.7 mL/min (by C-G formula based on SCr of 0.94 mg/dL).   Medical History: Past Medical History:  Diagnosis Date  . Anxiety   . Asthma   . Depression   . Diabetes ([place])   . DVT (deep venous thrombosis), hx of recurrent 05/23/2012  . Headache(784.0)    frequent  . History of blood clot in brain, 2012 - followed by [facility] 05/23/2012  . History of blood clots   . History of suicidal tendencies   . Hyperlipemia 08/25/2012  . Hyperlipidemia   . Kidney disease   . Kidney stones   . Migraines   . Obesity   . Pneumonia   . Seizure ([place])   . Seizure disorder - followed by [place] [facility] 05/23/2012  . Sleep apnea   . Tuberculosis     Assessment: 50 y/o M with LLE DVT on Lovenox PTA admitted for attempted thrombectomy 4/26.  Pharmacy consulted for heparin.     HL decreased to 0.24, subtherapeutic, likely due to 10,000 unit bolus in OR wearing off. H/H drop post-op, plt wnl, no active bleed reported. Patient with intermittent chest pain pending CT angio to rule out PE. Possible return to OR today for re-attempt thrombectomy.   Goal of Therapy:  Heparin level 0.3-0.7 units/ml Monitor platelets by anticoagulation protocol: Yes   Plan:  Increase heparin to 2000 units/hr F/u 6hr HL  Monitor daily HL, CBC/plt Monitor for signs/symptoms of bleeding  F/u CT angio, surgical plan and transition back to enoxaparin   Benetta Spar, PharmD, BCPS, [place] Pharmacist  Please check AMION for all [place] phone numbers After 10:00 PM, call [place]

## 2020-11-26 NOTE — Progress Notes (Signed)
ANTICOAGULATION CONSULT NOTE - Initial Consult  Pharmacy Consult for Heparin  Indication: DVT, s/p attempted thrombectomy 4/26  Allergies  Allergen Reactions  . Norco [Hydrocodone-Acetaminophen] Other (See Comments)    Paralysis-everything but breathing  . Penicillins Other (See Comments)    Did it involve swelling of the face/tongue/throat, SOB, or low BP? N/A Did it involve sudden or severe rash/hives, skin peeling, or any reaction on the inside of your mouth or nose? N/A Did you need to seek medical attention at a hospital or doctor's office? N/A When did it last happen?Child  If all above answers are "NO", may proceed with cephalosporin use.    Patient Measurements: Height: 5\' 11"  (180.3 cm) Weight: 113.4 kg (250 lb) IBW/kg (Calculated) : 75.3  Vital Signs: Temp: 98.4 F (36.9 C) (04/26 2337) Temp Source: Oral (04/26 2337) BP: 115/69 (04/26 2337) Pulse Rate: 84 (04/26 2337)  Labs: Recent Labs    11/25/20 0929 11/25/20 2303  HGB 15.3  --   HCT 45.0  --   HEPARINUNFRC  --  0.38  CREATININE 0.80  --     Estimated Creatinine Clearance: 143 mL/min (by C-G formula based on SCr of 0.8 mg/dL).   Medical History: Past Medical History:  Diagnosis Date  . Anxiety   . Asthma   . Depression   . Diabetes ([place])   . DVT (deep venous thrombosis), hx of recurrent 05/23/2012  . Headache(784.0)    frequent  . History of blood clot in brain, 2012 - followed by [facility] 05/23/2012  . History of blood clots   . History of suicidal tendencies   . Hyperlipemia 08/25/2012  . Hyperlipidemia   . Kidney disease   . Kidney stones   . Migraines   . Obesity   . Pneumonia   . Seizure ([place])   . Seizure disorder - followed by [place] [facility] 05/23/2012  . Sleep apnea   . Tuberculosis     Assessment: 50 y/o M with DVT on Lovenox PTA. He was brought back in for attempted thrombectomy 4/26. Heparin was re-started s/p procedure yesterday at 1750 units/hr. His heparin level  this AM is therapeutic at 0.38.   Goal of Therapy:  Heparin level 0.3-0.7 units/ml Monitor platelets by anticoagulation protocol: Yes   Plan:  Cont heparin at 1750 units/hr Confirmatory heparin level with AM labs  Narda Bonds, PharmD, [place] Pharmacist Phone: [phone]

## 2020-11-27 ENCOUNTER — Encounter (HOSPITAL_COMMUNITY): Payer: Self-pay | Admitting: Surgery

## 2020-11-27 DIAGNOSIS — E785 Hyperlipidemia, unspecified: Secondary | ICD-10-CM | POA: Diagnosis present

## 2020-11-27 DIAGNOSIS — Z880 Allergy status to penicillin: Secondary | ICD-10-CM | POA: Diagnosis not present

## 2020-11-27 DIAGNOSIS — E119 Type 2 diabetes mellitus without complications: Secondary | ICD-10-CM | POA: Diagnosis present

## 2020-11-27 DIAGNOSIS — Z83438 Family history of other disorder of lipoprotein metabolism and other lipidemia: Secondary | ICD-10-CM | POA: Diagnosis not present

## 2020-11-27 DIAGNOSIS — G40909 Epilepsy, unspecified, not intractable, without status epilepticus: Secondary | ICD-10-CM | POA: Diagnosis present

## 2020-11-27 DIAGNOSIS — F419 Anxiety disorder, unspecified: Secondary | ICD-10-CM | POA: Diagnosis present

## 2020-11-27 DIAGNOSIS — Z885 Allergy status to narcotic agent status: Secondary | ICD-10-CM | POA: Diagnosis not present

## 2020-11-27 DIAGNOSIS — I82422 Acute embolism and thrombosis of left iliac vein: Secondary | ICD-10-CM | POA: Diagnosis present

## 2020-11-27 DIAGNOSIS — Z8249 Family history of ischemic heart disease and other diseases of the circulatory system: Secondary | ICD-10-CM | POA: Diagnosis not present

## 2020-11-27 DIAGNOSIS — I2699 Other pulmonary embolism without acute cor pulmonale: Secondary | ICD-10-CM | POA: Diagnosis not present

## 2020-11-27 DIAGNOSIS — I82432 Acute embolism and thrombosis of left popliteal vein: Secondary | ICD-10-CM | POA: Diagnosis present

## 2020-11-27 DIAGNOSIS — K219 Gastro-esophageal reflux disease without esophagitis: Secondary | ICD-10-CM | POA: Diagnosis present

## 2020-11-27 DIAGNOSIS — F32A Depression, unspecified: Secondary | ICD-10-CM | POA: Diagnosis present

## 2020-11-27 DIAGNOSIS — I82412 Acute embolism and thrombosis of left femoral vein: Secondary | ICD-10-CM | POA: Diagnosis present

## 2020-11-27 DIAGNOSIS — Z86718 Personal history of other venous thrombosis and embolism: Secondary | ICD-10-CM | POA: Diagnosis not present

## 2020-11-27 DIAGNOSIS — J45909 Unspecified asthma, uncomplicated: Secondary | ICD-10-CM | POA: Diagnosis present

## 2020-11-27 DIAGNOSIS — Z86711 Personal history of pulmonary embolism: Secondary | ICD-10-CM | POA: Diagnosis not present

## 2020-11-27 DIAGNOSIS — I82402 Acute embolism and thrombosis of unspecified deep veins of left lower extremity: Secondary | ICD-10-CM | POA: Diagnosis not present

## 2020-11-27 DIAGNOSIS — Z79899 Other long term (current) drug therapy: Secondary | ICD-10-CM | POA: Diagnosis not present

## 2020-11-27 DIAGNOSIS — G4730 Sleep apnea, unspecified: Secondary | ICD-10-CM | POA: Diagnosis present

## 2020-11-27 DIAGNOSIS — Z7984 Long term (current) use of oral hypoglycemic drugs: Secondary | ICD-10-CM | POA: Diagnosis not present

## 2020-11-27 DIAGNOSIS — Z833 Family history of diabetes mellitus: Secondary | ICD-10-CM | POA: Diagnosis not present

## 2020-11-27 LAB — GLUCOSE, CAPILLARY
Glucose-Capillary: 153 mg/dL — ABNORMAL HIGH (ref 70–99)
Glucose-Capillary: 173 mg/dL — ABNORMAL HIGH (ref 70–99)
Glucose-Capillary: 146 mg/dL — ABNORMAL HIGH (ref 70–99)
Glucose-Capillary: 175 mg/dL — ABNORMAL HIGH (ref 70–99)

## 2020-11-27 LAB — CBC
HCT: 42.6 % (ref 39.0–52.0)
Hemoglobin: 13.9 g/dL (ref 13.0–17.0)
MCH: 28.4 pg (ref 26.0–34.0)
MCHC: 32.6 g/dL (ref 30.0–36.0)
MCV: 86.9 fL (ref 80.0–100.0)
Platelets: 251 10*3/uL (ref 150–400)
RBC: 4.9 MIL/uL (ref 4.22–5.81)
RDW: 12.8 % (ref 11.5–15.5)
WBC: 7.1 10*3/uL (ref 4.0–10.5)
nRBC: 0 % (ref 0.0–0.2)

## 2020-11-27 LAB — HEPARIN LEVEL (UNFRACTIONATED): Heparin Unfractionated: 0.7 IU/mL (ref 0.30–0.70)

## 2020-11-27 MED ORDER — ACETAMINOPHEN 325 MG PO TABS
650.0000 mg | ORAL_TABLET | Freq: Four times a day (QID) | ORAL | Status: DC | PRN
  Administered 2020-11-27 – 2020-11-29 (×2): 650 mg via ORAL
  Filled 2020-11-27 (×2): qty 2

## 2020-11-27 NOTE — Progress Notes (Signed)
ANTICOAGULATION CONSULT NOTE - Follow Up Consult  Pharmacy Consult for IV Heparin  Indication: LLE DVT, s/p attempted thrombectomy 4/26, PE  Allergies  Allergen Reactions  . Norco [Hydrocodone-Acetaminophen] Other (See Comments)    Paralysis-everything but breathing  . Penicillins Other (See Comments)    Did it involve swelling of the face/tongue/throat, SOB, or low BP? N/A Did it involve sudden or severe rash/hives, skin peeling, or any reaction on the inside of your mouth or nose? N/A Did you need to seek medical attention at a hospital or doctor's office? N/A When did it last happen?Child  If all above answers are "NO", may proceed with cephalosporin use.    Patient Measurements: Height: 5\' 11"  (180.3 cm) Weight: 113.4 kg (250 lb) IBW/kg (Calculated) : 75.3  Heparin Dosing Weight: 100 kg  Vital Signs: Temp: 97.8 F (36.6 C) (04/28 1130) Temp Source: Oral (04/28 1130) BP: 106/67 (04/28 1130) Pulse Rate: 74 (04/28 1130)  Labs: Recent Labs    11/25/20 0929 11/25/20 2303 11/26/20 0139 11/26/20 0753 11/26/20 0942 11/26/20 1246 11/26/20 2034 11/27/20 0537  HGB 15.3  --  13.3  --   --   --   --  13.9  HCT 45.0  --  41.0  --   --   --   --  42.6  PLT  --   --  289  --   --   --   --  251  HEPARINUNFRC  --    < > 0.24*  --   --  0.63 0.72* 0.70  CREATININE 0.80  --  0.94  --   --   --   --   --   TROPONINIHS  --   --   --  5 <2  --   --   --    < > = values in this interval not displayed.    Estimated Creatinine Clearance: 121.7 mL/min (by C-G formula based on SCr of 0.94 mg/dL).   Medical History: Past Medical History:  Diagnosis Date  . Anxiety   . Asthma   . Depression   . Diabetes ([place])   . DVT (deep venous thrombosis), hx of recurrent 05/23/2012  . Headache(784.0)    frequent  . History of blood clot in brain, 2012 - followed by [facility] 05/23/2012  . History of blood clots   . History of suicidal tendencies   . Hyperlipemia 08/25/2012  .  Hyperlipidemia   . Kidney disease   . Kidney stones   . Migraines   . Obesity   . Pneumonia   . Seizure ([place])   . Seizure disorder - followed by [place] [facility] 05/23/2012  . Sleep apnea   . Tuberculosis     Assessment: 50 yr old male with LLE DVT on Lovenox PTA was admitted for attempted thrombectomy 4/26.  Pharmacy was consulted for IV heparin dosing.  Patient also with intermittent chest pain; 4/27 CT positive for RLL PE without RHS.   Heparin level this morning came back at upper end of goal at 0.7, on heparin infusion at 1900 units/hr. CBC stable. No s/sx of bleeding or infusion issues.  Goal of Therapy:  Heparin level 0.3-0.7 units/ml Monitor platelets by anticoagulation protocol: Yes   Plan:  Reduce heparin infusion to 1850 units/hr to keep in goal range  Monitor daily heparin level, CBC Monitor for bleeding F/U surgical plan and transition back to enoxaparin   Antonietta Jewel, PharmD, [place] Pharmacist  Phone: [phone] 11/27/2020 12:24 PM  Please check AMION for all [place] phone numbers After 10:00 PM, call [place] [phone]

## 2020-11-27 NOTE — Progress Notes (Addendum)
  Progress Note    11/27/2020 7:35 AM 2 Days Post-Op  Subjective:  L leg does not feel as tight   Vitals:   11/26/20 2308 11/27/20 0407  BP: 116/69 107/64  Pulse: 76 79  Resp: 16 17  Temp: 98.4 F (36.9 C) 97.7 F (36.5 C)  SpO2: 94% 93%   Physical Exam: Lungs:  Non labored Incisions:  L popliteal cath site without hematoma Extremities:  Palpable pedal pulses Neurologic: A&O  CBC    Component Value Date/Time   WBC 7.1 11/27/2020 0537   RBC 4.90 11/27/2020 0537   HGB 13.9 11/27/2020 0537   HGB 13.9 11/03/2020 1007   HCT 42.6 11/27/2020 0537   PLT 251 11/27/2020 0537   PLT 231 11/03/2020 1007   MCV 86.9 11/27/2020 0537   MCH 28.4 11/27/2020 0537   MCHC 32.6 11/27/2020 0537   RDW 12.8 11/27/2020 0537   LYMPHSABS 2.1 11/20/2020 2311   MONOABS 0.9 11/20/2020 2311   EOSABS 0.1 11/20/2020 2311   BASOSABS 0.1 11/20/2020 2311    BMET    Component Value Date/Time   NA 137 11/26/2020 0139   K 3.7 11/26/2020 0139   CL 103 11/26/2020 0139   CO2 23 11/26/2020 0139   GLUCOSE 122 (H) 11/26/2020 0139   BUN 16 11/26/2020 0139   CREATININE 0.94 11/26/2020 0139   CREATININE 1.12 11/03/2020 1007   CALCIUM 9.4 11/26/2020 0139   GFRNONAA >60 11/26/2020 0139   GFRNONAA >60 11/03/2020 1007   GFRAA >60 02/18/2020 0939    INR    Component Value Date/Time   INR 1.0 11/20/2020 2311     Intake/Output Summary (Last 24 hours) at 11/27/2020 0735 Last data filed at 11/27/2020 0300 Gross per 24 hour  Intake 395.57 ml  Output --  Net 395.57 ml     Assessment/Plan:  50 y.o. male is s/p attempted LLE venous thrombectomy 2 Days Post-Op   LLE well perfused without compartment syndrome; subjectively feels less edematous CT demonstrating PE; Chest and back pleuritic pain controlled with tylenol Continue heparin per pharmacy Plan is for another attempt at venous thrombectomy of LLE by Dr. Trula Slade tomorrow NPO past midnight Consent   Bryan Ligas, PA-C [facility] [phone] 11/27/2020 7:35 AM  I agree with the above.  I have seen and evaluated the patient. His chest pain is improved and is likely secondary to his PE.  His PE does not require any intervention other than anticoagulation as he remains on room air and is not tachycardic.  We discussed a repeat attempt at mechanical thrombectomy in the operating room tomorrow.  All questions were answered.  I will not stop his heparin for the procedure.  Bryan Mcmillan

## 2020-11-27 NOTE — Anesthesia Preprocedure Evaluation (Addendum)
Anesthesia Evaluation  Patient identified by MRN, date of birth, ID band Patient awake    Reviewed: Allergy & Precautions, NPO status , Patient's Chart, lab work & pertinent test results  History of Anesthesia Complications Negative for: history of anesthetic complications  Airway Mallampati: II  TM Distance: >3 FB Neck ROM: Full    Dental  (+) Dental Advisory Given   Pulmonary asthma , sleep apnea , former smoker,   Hx TB    Pulmonary exam normal        Cardiovascular + DVT  Normal cardiovascular exam     Neuro/Psych  Headaches, Seizures - (one incident 2012), Well Controlled,  PSYCHIATRIC DISORDERS Anxiety Depression  Glioma     GI/Hepatic negative GI ROS, Neg liver ROS,   Endo/Other  diabetes, Type 2, Oral Hypoglycemic Agents Obesity   Renal/GU negative Renal ROS     Musculoskeletal negative musculoskeletal ROS (+)   Abdominal   Peds  Hematology negative hematology ROS (+)   Anesthesia Other Findings Covid test negative   Reproductive/Obstetrics                            Anesthesia Physical Anesthesia Plan  ASA: III  Anesthesia Plan: General   Post-op Pain Management:    Induction: Intravenous  PONV Risk Score and Plan: 2 and Treatment may vary due to age or medical condition, Ondansetron, Dexamethasone and Midazolam  Airway Management Planned: Oral ETT  Additional Equipment: None  Intra-op Plan:   Post-operative Plan: Extubation in OR  Informed Consent: I have reviewed the patients History and Physical, chart, labs and discussed the procedure including the risks, benefits and alternatives for the proposed anesthesia with the patient or authorized representative who has indicated his/her understanding and acceptance.     Dental advisory given  Plan Discussed with: CRNA and Anesthesiologist  Anesthesia Plan Comments:        Anesthesia Quick  Evaluation

## 2020-11-27 NOTE — H&P (View-Only) (Signed)
  Progress Note    11/27/2020 7:35 AM 2 Days Post-Op  Subjective:  L leg does not feel as tight   Vitals:   11/26/20 2308 11/27/20 0407  BP: 116/69 107/64  Pulse: 76 79  Resp: 16 17  Temp: 98.4 F (36.9 C) 97.7 F (36.5 C)  SpO2: 94% 93%   Physical Exam: Lungs:  Non labored Incisions:  L popliteal cath site without hematoma Extremities:  Palpable pedal pulses Neurologic: A&O  CBC    Component Value Date/Time   WBC 7.1 11/27/2020 0537   RBC 4.90 11/27/2020 0537   HGB 13.9 11/27/2020 0537   HGB 13.9 11/03/2020 1007   HCT 42.6 11/27/2020 0537   PLT 251 11/27/2020 0537   PLT 231 11/03/2020 1007   MCV 86.9 11/27/2020 0537   MCH 28.4 11/27/2020 0537   MCHC 32.6 11/27/2020 0537   RDW 12.8 11/27/2020 0537   LYMPHSABS 2.1 11/20/2020 2311   MONOABS 0.9 11/20/2020 2311   EOSABS 0.1 11/20/2020 2311   BASOSABS 0.1 11/20/2020 2311    BMET    Component Value Date/Time   NA 137 11/26/2020 0139   K 3.7 11/26/2020 0139   CL 103 11/26/2020 0139   CO2 23 11/26/2020 0139   GLUCOSE 122 (H) 11/26/2020 0139   BUN 16 11/26/2020 0139   CREATININE 0.94 11/26/2020 0139   CREATININE 1.12 11/03/2020 1007   CALCIUM 9.4 11/26/2020 0139   GFRNONAA >60 11/26/2020 0139   GFRNONAA >60 11/03/2020 1007   GFRAA >60 02/18/2020 0939    INR    Component Value Date/Time   INR 1.0 11/20/2020 2311     Intake/Output Summary (Last 24 hours) at 11/27/2020 0735 Last data filed at 11/27/2020 0300 Gross per 24 hour  Intake 395.57 ml  Output --  Net 395.57 ml     Assessment/Plan:  49 y.o. male is s/p attempted LLE venous thrombectomy 2 Days Post-Op   LLE well perfused without compartment syndrome; subjectively feels less edematous CT demonstrating PE; Chest and back pleuritic pain controlled with tylenol Continue heparin per pharmacy Plan is for another attempt at venous thrombectomy of LLE by Dr. Kron Everton tomorrow NPO past midnight Consent   Bryan Eveland, PA-C [facility] [phone] 11/27/2020 7:35 AM  I agree with the above.  I have seen and evaluated the patient. His chest pain is improved and is likely secondary to his PE.  His PE does not require any intervention other than anticoagulation as he remains on room air and is not tachycardic.  We discussed a repeat attempt at mechanical thrombectomy in the operating room tomorrow.  All questions were answered.  I will not stop his heparin for the procedure.  Bryan Mcmillan  

## 2020-11-28 ENCOUNTER — Encounter (HOSPITAL_COMMUNITY)
Admission: AD | Disposition: A | Discharge status: Home / Self Care | Payer: Self-pay | Source: Ambulatory Visit | Attending: Surgery

## 2020-11-28 ENCOUNTER — Inpatient Hospital Stay (HOSPITAL_COMMUNITY): Payer: 59

## 2020-11-28 ENCOUNTER — Inpatient Hospital Stay (HOSPITAL_COMMUNITY): Payer: 59 | Admitting: Certified Registered Nurse Anesthetist

## 2020-11-28 HISTORY — PX: ULTRASOUND GUIDANCE FOR VASCULAR ACCESS: SHX6516

## 2020-11-28 HISTORY — PX: VENOGRAM: SHX5497

## 2020-11-28 HISTORY — PX: MECHANICAL THROMBECTOMY WITH AORTOGRAM AND INTERVENTION: SHX6836

## 2020-11-28 LAB — GLUCOSE, CAPILLARY
Glucose-Capillary: 121 mg/dL — ABNORMAL HIGH (ref 70–99)
Glucose-Capillary: 147 mg/dL — ABNORMAL HIGH (ref 70–99)
Glucose-Capillary: 176 mg/dL — ABNORMAL HIGH (ref 70–99)
Glucose-Capillary: 214 mg/dL — ABNORMAL HIGH (ref 70–99)
Glucose-Capillary: 182 mg/dL — ABNORMAL HIGH (ref 70–99)

## 2020-11-28 LAB — CBC
HCT: 40.2 % (ref 39.0–52.0)
Hemoglobin: 13.1 g/dL (ref 13.0–17.0)
MCH: 28 pg (ref 26.0–34.0)
MCHC: 32.6 g/dL (ref 30.0–36.0)
MCV: 85.9 fL (ref 80.0–100.0)
Platelets: 300 10*3/uL (ref 150–400)
RBC: 4.68 MIL/uL (ref 4.22–5.81)
RDW: 12.9 % (ref 11.5–15.5)
WBC: 7.2 10*3/uL (ref 4.0–10.5)
nRBC: 0 % (ref 0.0–0.2)

## 2020-11-28 LAB — HEPARIN LEVEL (UNFRACTIONATED): Heparin Unfractionated: 0.41 IU/mL (ref 0.30–0.70)

## 2020-11-28 SURGERY — THROMBECTOMY, MECHANICAL
Anesthesia: General | Site: Leg Upper | Laterality: Left

## 2020-11-28 MED ORDER — VANCOMYCIN HCL IN DEXTROSE 1-5 GM/200ML-% IV SOLN
INTRAVENOUS | Status: AC
  Filled 2020-11-28: qty 200

## 2020-11-28 MED ORDER — VANCOMYCIN HCL 1000 MG IV SOLR
INTRAVENOUS | Status: DC | PRN
  Administered 2020-11-28: 1000 mg via INTRAVENOUS

## 2020-11-28 MED ORDER — SUGAMMADEX SODIUM 200 MG/2ML IV SOLN
INTRAVENOUS | Status: DC | PRN
  Administered 2020-11-28: 200 mg via INTRAVENOUS

## 2020-11-28 MED ORDER — PROMETHAZINE HCL 25 MG/ML IJ SOLN
INTRAMUSCULAR | Status: AC
  Filled 2020-11-28: qty 1

## 2020-11-28 MED ORDER — DEXAMETHASONE SODIUM PHOSPHATE 10 MG/ML IJ SOLN
INTRAMUSCULAR | Status: DC | PRN
  Administered 2020-11-28: 4 mg via INTRAVENOUS

## 2020-11-28 MED ORDER — MIDAZOLAM HCL 5 MG/5ML IJ SOLN
INTRAMUSCULAR | Status: DC | PRN
  Administered 2020-11-28: 2 mg via INTRAVENOUS

## 2020-11-28 MED ORDER — HEPARIN SODIUM (PORCINE) 1000 UNIT/ML IJ SOLN
INTRAMUSCULAR | Status: DC | PRN
  Administered 2020-11-28 (×2): 5000 [IU] via INTRAVENOUS

## 2020-11-28 MED ORDER — HEPARIN SODIUM (PORCINE) 1000 UNIT/ML IJ SOLN
INTRAMUSCULAR | Status: AC
  Filled 2020-11-28: qty 1

## 2020-11-28 MED ORDER — FENTANYL CITRATE (PF) 250 MCG/5ML IJ SOLN
INTRAMUSCULAR | Status: AC
  Filled 2020-11-28: qty 5

## 2020-11-28 MED ORDER — PROPOFOL 10 MG/ML IV BOLUS
INTRAVENOUS | Status: DC | PRN
  Administered 2020-11-28: 200 mg via INTRAVENOUS

## 2020-11-28 MED ORDER — PROMETHAZINE HCL 25 MG/ML IJ SOLN
6.2500 mg | INTRAMUSCULAR | Status: DC | PRN
Start: 2020-11-28 — End: 2020-11-28
  Administered 2020-11-28: 12.5 mg via INTRAVENOUS

## 2020-11-28 MED ORDER — MIDAZOLAM HCL 2 MG/2ML IJ SOLN
INTRAMUSCULAR | Status: AC
  Filled 2020-11-28: qty 2

## 2020-11-28 MED ORDER — SUCCINYLCHOLINE CHLORIDE 200 MG/10ML IV SOSY
PREFILLED_SYRINGE | INTRAVENOUS | Status: AC
  Filled 2020-11-28: qty 10

## 2020-11-28 MED ORDER — PHENYLEPHRINE 40 MCG/ML (10ML) SYRINGE FOR IV PUSH (FOR BLOOD PRESSURE SUPPORT)
PREFILLED_SYRINGE | INTRAVENOUS | Status: AC
  Filled 2020-11-28: qty 10

## 2020-11-28 MED ORDER — EPHEDRINE 5 MG/ML INJ
INTRAVENOUS | Status: AC
  Filled 2020-11-28: qty 10

## 2020-11-28 MED ORDER — ROCURONIUM BROMIDE 10 MG/ML (PF) SYRINGE
PREFILLED_SYRINGE | INTRAVENOUS | Status: DC | PRN
  Administered 2020-11-28: 60 mg via INTRAVENOUS

## 2020-11-28 MED ORDER — FENTANYL CITRATE (PF) 100 MCG/2ML IJ SOLN
INTRAMUSCULAR | Status: DC | PRN
  Administered 2020-11-28 (×2): 50 ug via INTRAVENOUS

## 2020-11-28 MED ORDER — ONDANSETRON HCL 4 MG/2ML IJ SOLN
INTRAMUSCULAR | Status: DC | PRN
  Administered 2020-11-28: 4 mg via INTRAVENOUS

## 2020-11-28 MED ORDER — SODIUM CHLORIDE 0.9 % IV SOLN
INTRAVENOUS | Status: DC | PRN
  Administered 2020-11-28: 500 mL

## 2020-11-28 MED ORDER — SODIUM CHLORIDE 0.9 % IV SOLN
INTRAVENOUS | Status: AC
  Filled 2020-11-28: qty 1.2

## 2020-11-28 MED ORDER — ROCURONIUM BROMIDE 10 MG/ML (PF) SYRINGE
PREFILLED_SYRINGE | INTRAVENOUS | Status: AC
  Filled 2020-11-28: qty 10

## 2020-11-28 MED ORDER — PROPOFOL 10 MG/ML IV BOLUS
INTRAVENOUS | Status: AC
  Filled 2020-11-28: qty 40

## 2020-11-28 MED ORDER — LIDOCAINE 2% (20 MG/ML) 5 ML SYRINGE
INTRAMUSCULAR | Status: AC
  Filled 2020-11-28: qty 5

## 2020-11-28 MED ORDER — FENTANYL CITRATE (PF) 100 MCG/2ML IJ SOLN: 25.0000 ug | INTRAMUSCULAR | Status: DC | PRN

## 2020-11-28 MED ORDER — DEXAMETHASONE SODIUM PHOSPHATE 10 MG/ML IJ SOLN
INTRAMUSCULAR | Status: AC
  Filled 2020-11-28: qty 1

## 2020-11-28 MED ORDER — ONDANSETRON HCL 4 MG/2ML IJ SOLN
INTRAMUSCULAR | Status: AC
  Filled 2020-11-28: qty 2

## 2020-11-28 MED ORDER — LIDOCAINE 2% (20 MG/ML) 5 ML SYRINGE
INTRAMUSCULAR | Status: DC | PRN
  Administered 2020-11-28: 60 mg via INTRAVENOUS

## 2020-11-28 MED ORDER — IODIXANOL 320 MG/ML IV SOLN
INTRAVENOUS | Status: DC | PRN
  Administered 2020-11-28: 25 mL via INTRAVENOUS

## 2020-11-28 SURGICAL SUPPLY — 64 items
BAG ISOLATION DRAPE 18X18 (DRAPES) IMPLANT
BALLN MUSTANG 10X80X75 (BALLOONS) ×3
BALLOON MUSTANG 10X80X75 (BALLOONS) IMPLANT
BNDG COHESIVE 6X5 TAN NS LF (GAUZE/BANDAGES/DRESSINGS) ×1 IMPLANT
BNDG ELASTIC 6X15 VLCR STRL LF (GAUZE/BANDAGES/DRESSINGS) ×2 IMPLANT
BNDG GAUZE ELAST 4 BULKY (GAUZE/BANDAGES/DRESSINGS) ×2 IMPLANT
CANISTER SUCT 3000ML PPV (MISCELLANEOUS) ×3 IMPLANT
CATH ANGIO 5F BER2 100CM (CATHETERS) ×1 IMPLANT
CATH OMNI FLUSH 5F 65CM (CATHETERS) IMPLANT
CATH RETRIEVER CLOT 16MMX105CM (CATHETERS) ×1 IMPLANT
CATH ROBINSON RED A/P 18FR (CATHETERS) ×1 IMPLANT
CATH VISIONS PV .035 IVUS (CATHETERS) ×1 IMPLANT
COVER BACK TABLE 60X90IN (DRAPES) ×3 IMPLANT
COVER DOME SNAP 22 D (MISCELLANEOUS) ×3 IMPLANT
COVER PROBE W GEL 5X96 (DRAPES) ×3 IMPLANT
COVER SURGICAL LIGHT HANDLE (MISCELLANEOUS) ×3 IMPLANT
COVER TRANSDUCER ULTRASND GEL (DISPOSABLE) ×1 IMPLANT
DERMABOND ADVANCED (GAUZE/BANDAGES/DRESSINGS)
DERMABOND ADVANCED .7 DNX12 (GAUZE/BANDAGES/DRESSINGS) IMPLANT
DRAPE FEMORAL ANGIO 80X135IN (DRAPES) ×3 IMPLANT
DRAPE ISOLATION BAG 18X18 (DRAPES) ×1
DRAPE ORTHO SPLIT 77X108 STRL (DRAPES) ×2
DRAPE SURG ORHT 6 SPLT 77X108 (DRAPES) IMPLANT
DRSG TEGADERM 4X4.75 (GAUZE/BANDAGES/DRESSINGS) ×1 IMPLANT
ELECT REM PT RETURN 9FT ADLT (ELECTROSURGICAL)
ELECTRODE REM PT RTRN 9FT ADLT (ELECTROSURGICAL) IMPLANT
GAUZE 4X4 16PLY RFD (DISPOSABLE) ×3 IMPLANT
GAUZE SPONGE 4X4 12PLY STRL (GAUZE/BANDAGES/DRESSINGS) ×1 IMPLANT
GLIDEWIRE ADV .035X260CM (WIRE) ×1 IMPLANT
GLOVE BIOGEL PI IND STRL 7.5 (GLOVE) ×2 IMPLANT
GLOVE BIOGEL PI INDICATOR 7.5 (GLOVE) ×1
GLOVE SURG SS PI 7.5 STRL IVOR (GLOVE) ×3 IMPLANT
GOWN STRL REUS W/ TWL LRG LVL3 (GOWN DISPOSABLE) ×4 IMPLANT
GOWN STRL REUS W/ TWL XL LVL3 (GOWN DISPOSABLE) ×2 IMPLANT
GOWN STRL REUS W/TWL LRG LVL3 (GOWN DISPOSABLE) ×2
GOWN STRL REUS W/TWL XL LVL3 (GOWN DISPOSABLE) ×1
KIT BASIN OR (CUSTOM PROCEDURE TRAY) ×3 IMPLANT
KIT ENCORE 26 ADVANTAGE (KITS) ×1 IMPLANT
KIT TURNOVER KIT B (KITS) ×3 IMPLANT
NDL HYPO 25GX1X1/2 BEV (NEEDLE) IMPLANT
NDL PERC 18GX7CM (NEEDLE) ×2 IMPLANT
NEEDLE HYPO 25GX1X1/2 BEV (NEEDLE) IMPLANT
NEEDLE PERC 18GX7CM (NEEDLE) ×3 IMPLANT
NS IRRIG 1000ML POUR BTL (IV SOLUTION) IMPLANT
PAD ARMBOARD 7.5X6 YLW CONV (MISCELLANEOUS) ×6 IMPLANT
PROTECTION STATION PRESSURIZED (MISCELLANEOUS) ×6
SET MICROPUNCTURE 5F STIFF (MISCELLANEOUS) ×3 IMPLANT
SHEATH CLOT RETRIEVER (SHEATH) ×1 IMPLANT
SHEATH PINNACLE 5FR (SHEATH) ×1 IMPLANT
SHEATH PINNACLE 8F 10CM (SHEATH) ×1 IMPLANT
SLEEVE ISOL F/PACE RF HD COVER (MISCELLANEOUS) ×1 IMPLANT
STATION PROTECTION PRESSURIZED (MISCELLANEOUS) ×2 IMPLANT
STOPCOCK MORSE 400PSI 3WAY (MISCELLANEOUS) ×3 IMPLANT
SUT ETHILON 3 0 PS 1 (SUTURE) ×1 IMPLANT
SYR 10ML LL (SYRINGE) ×12 IMPLANT
SYR 20CC LL (SYRINGE) ×9 IMPLANT
SYR 30ML LL (SYRINGE) ×3 IMPLANT
SYR CONTROL 10ML LL (SYRINGE) IMPLANT
SYR MEDRAD MARK V 150ML (SYRINGE) IMPLANT
TOWEL GREEN STERILE (TOWEL DISPOSABLE) ×6 IMPLANT
TOWEL GREEN STERILE FF (TOWEL DISPOSABLE) ×3 IMPLANT
TRAY FOLEY MTR SLVR 16FR STAT (SET/KITS/TRAYS/PACK) IMPLANT
TUBING HIGH PRESSURE 120CM (CONNECTOR) IMPLANT
WIRE BENTSON .035X145CM (WIRE) ×4 IMPLANT

## 2020-11-28 NOTE — Progress Notes (Signed)
ANTICOAGULATION CONSULT NOTE - Follow Up Consult  Pharmacy Consult for IV Heparin  Indication: LLE DVT, s/p attempted thrombectomy 4/26, PE  Allergies  Allergen Reactions  . Norco [Hydrocodone-Acetaminophen] Other (See Comments)    Paralysis-everything but breathing  . Penicillins Other (See Comments)    Did it involve swelling of the face/tongue/throat, SOB, or low BP? N/A Did it involve sudden or severe rash/hives, skin peeling, or any reaction on the inside of your mouth or nose? N/A Did you need to seek medical attention at a hospital or doctor's office? N/A When did it last happen?Child  If all above answers are "NO", may proceed with cephalosporin use.    Patient Measurements: Height: 5\' 11"  (180.3 cm) Weight: 113.4 kg (250 lb) IBW/kg (Calculated) : 75.3  Heparin Dosing Weight: 100 kg  Vital Signs: Temp: 97.9 F (36.6 C) (04/29 1045) Temp Source: Oral (04/29 0337) BP: 116/73 (04/29 1045) Pulse Rate: 68 (04/29 1045)  Labs: Recent Labs    11/26/20 0139 11/26/20 0753 11/26/20 0942 11/26/20 1246 11/26/20 2034 11/27/20 0537 11/28/20 0222  HGB 13.3  --   --   --   --  13.9 13.1  HCT 41.0  --   --   --   --  42.6 40.2  PLT 289  --   --   --   --  251 300  HEPARINUNFRC 0.24*  --   --    < > 0.72* 0.70 0.41  CREATININE 0.94  --   --   --   --   --   --   TROPONINIHS  --  5 <2  --   --   --   --    < > = values in this interval not displayed.    Estimated Creatinine Clearance: 121.7 mL/min (by C-G formula based on SCr of 0.94 mg/dL).   Medical History: Past Medical History:  Diagnosis Date  . Anxiety   . Asthma   . Depression   . Diabetes ([place])   . DVT (deep venous thrombosis), hx of recurrent 05/23/2012  . Headache(784.0)    frequent  . 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 History of blood clots   . History of suicidal tendencies   . Hyperlipemia 08/25/2012  . Hyperlipidemia   . Kidney disease   . Kidney  stones   . Migraines   . Obesity   . Pneumonia   . Seizure ([place])   . Seizure disorder - followed by [facility] 05/23/2012  . Sleep apnea   . Tuberculosis     Assessment: 50 yr old male with LLE DVT on Lovenox PTA was admitted for attempted thrombectomy 4/26.  Pharmacy was consulted for IV heparin dosing.  Patient also with intermittent chest pain; 4/27 CT positive for RLL PE without RHS.   Heparin level this morning 0.4 at goal on heparin drip rate 1850 units/hr. CBC stable. No s/sx of bleeding or infusion issues.  Goal of Therapy:  Heparin level 0.3-0.7 units/ml Monitor platelets by anticoagulation protocol: Yes   Plan:  Continue heparin infusion 1850 units/hr  Monitor daily heparin level, CBC Monitor for bleeding F/U surgical plan and transition back to enoxaparin    Bonnita Nasuti Pharm.D. CPP, BCPS Clinical Pharmacist [phone] 11/28/2020 2:16 PM    Please check AMION for all [place] phone numbers After 10:00 PM, call [place] [phone]

## 2020-11-28 NOTE — Progress Notes (Signed)
Intraoperative ultrasound guidance completed.  11/28/2020 10:15 AM Delman Kitten, RVT, RDCS, RDMS

## 2020-11-28 NOTE — Anesthesia Procedure Notes (Signed)
Procedure Name: Intubation Date/Time: 11/28/2020 7:55 AM Performed by: Lowella Dell, CRNA Pre-anesthesia Checklist: Patient identified, Emergency Drugs available, Suction available and Patient being monitored Patient Re-evaluated:Patient Re-evaluated prior to induction Oxygen Delivery Method: Circle System Utilized Preoxygenation: Pre-oxygenation with 100% oxygen Induction Type: IV induction Ventilation: Mask ventilation without difficulty Laryngoscope Size: Mac and 4 Grade View: Grade I Tube type: Oral Tube size: 7.5 mm Number of attempts: 1 Airway Equipment and Method: Stylet Placement Confirmation: ETT inserted through vocal cords under direct vision,  positive ETCO2 and breath sounds checked- equal and bilateral Secured at: 22 cm Tube secured with: Tape Dental Injury: Teeth and Oropharynx as per pre-operative assessment

## 2020-11-28 NOTE — Op Note (Signed)
Patient name: Bryan Mcmillan MRN: [id number] DOB: 1971-01-13 Sex: male  11/28/2020 Pre-operative Diagnosis: recurrent left leg DVT Post-operative diagnosis:  Same Surgeon:  [name] Procedure Performed:  1.  U/s guided access, left popliteal vein  2.  Left leg venogram and central venogram  3.  IVUS left popliteal, femoral, common femoral , external iliac, and common iliac vein and inferior vena cava  4.  Mechanical thrombectomy of left popliteal, femoral, common femoral , external iliac, and common iliac vein   5.  Balloon venoplasty of left popliteal, femoral, common femoral , external iliac, and common iliac vein   Indications:  This is a 50 year old male with recurrent left leg DVT.  He comes in today for intervention  Procedure:  The patient was identified in the holding area and taken to room 8.  The patient was then placed supine on the table and prepped and draped in the usual sterile fashion.  A time out was called.  Ultrasound was used to evaluate the left popliteal vein which had partially occlusive thrombus. It was cannulated under ultrasound guidance with a micropuncture needle.  A 018 wire was then advanced without resistance followed by placement of a micropuncture sheath.  I then performed a contrast injection to confirm that I was in the popliteal vein.  Next an 8 French sheath was placed.  Using a Berenstein 2 catheter and a Glidewire advantage, the wire was advanced into the left subclavian vein.  A ACT was checked and heparin was redosed.  He maintained on a heparin drip.  I then performed IVUS evaluation of the left popliteal, femoral, common femoral, external iliac and common iliac veins as well as the vena cava.  There was significant thrombus burden within the iliac system as well as the common femoral femoral and popliteal veins.  I did not see a  May Thurner compression of the iliac veins.  Next, the 8 French sheath was removed.  A 16 French dilator was  inserted followed by placement of the INARI sheath.  I confirmed that the tip of the sheath was fully expanded.  The device was then advanced into the inferior vena cava and the basket was deployed.  I performed mechanical thrombectomy of the left common iliac, external iliac, common femoral, popliteal, and femoral veins.  This was done 4 times, and 4 different orientations.  A significant amount of chronic thrombus was evacuated with a small acute component.  I then reinserted the IVUS catheter.  The clot within the iliac system appeared to be completely removed.  The common femoral vein was also widely patent as was the profundofemoral vein.  There was residual thrombus at the lesser trochanter within the proximal femoral vein.  I then reinserted the INARI device and performed additional mechanical thrombectomies in all 4 orientations at the level of the lesser trochanter.  No significant thrombus was evacuated.  I felt that this was most likely chronic and would not be able to be removed with the device.  I then performed balloon venoplasty of the left common iliac, external iliac, common femoral, femoral and popliteal veins.  Completion venography was then performed which showed good opacification with no obvious thrombus burden within the popliteal, femoral, common femoral, external iliac, common iliac veins as well as the vena cava.  I was satisfied with these results and elected to stop the procedure.  The wire was removed and then I removed the sheath, closing the venotomy site with a 2-0  nylon over top of a red rubber catheter.  A sterile dressing was applied.  I then wrapped the leg with Kerlix, Ace wrap, and Coban.  He was taken to recovery in stable condition.      Theotis Burrow, M.D., [facility] [place] Office: [phone] Pager:  [phone]

## 2020-11-28 NOTE — Interval H&P Note (Signed)
History and Physical Interval Note:  11/28/2020 7:32 AM  Bryan Mcmillan  has presented today for surgery, with the diagnosis of Deep Vein Thrombosis.  The various methods of treatment have been discussed with the patient and family. After consideration of risks, benefits and other options for treatment, the patient has consented to  Procedure(s): MECHANICAL THROMBECTOMY WITH AORTOGRAM AND INTERVENTION (Left) as a surgical intervention.  The patient's history has been reviewed, patient examined, no change in status, stable for surgery.  I have reviewed the patient's chart and labs.  Questions were answered to the patient's satisfaction.     [name]

## 2020-11-28 NOTE — Anesthesia Postprocedure Evaluation (Signed)
Anesthesia Post Note  Patient: Ehan Freas Fleer  Procedure(s) Performed: MECHANICAL THROMBECTOMY OF LEFT COMMON ILIAC VEIN TO POPLITEAL VEIN (Left Leg Upper) VENOGRAM (Left Leg Upper) ULTRASOUND GUIDANCE FOR VASCULAR ACCESS OF LEFT POPLITEAL VEIN (Left Leg Lower) INTRAVASCULAR ULTRASOUND OF IVC, LEFT COMMON ILIAC VEIN, LEFT EXTERNAL ILIAC VEIN, LEFT SUPERFICAL FEMORAL VEIN, AND LEFT POPLITEAL VEIN (Left Leg Upper)     Patient location during evaluation: PACU Anesthesia Type: General Level of consciousness: awake and alert Pain management: pain level controlled Vital Signs Assessment: post-procedure vital signs reviewed and stable Respiratory status: spontaneous breathing, nonlabored ventilation and respiratory function stable Cardiovascular status: blood pressure returned to baseline and stable Postop Assessment: no apparent nausea or vomiting Anesthetic complications: no   No complications documented.  Last Vitals:  Vitals:   11/28/20 1030 11/28/20 1045  BP: 125/72 116/73  Pulse: 73 68  Resp: 12 13  Temp: 36.6 C 36.6 C  SpO2: 94% 93%    Last Pain:  Vitals:   11/28/20 1030  TempSrc:   PainSc: [place]

## 2020-11-28 NOTE — Transfer of Care (Signed)
Immediate Anesthesia Transfer of Care Note  Patient: Bryan Mcmillan  Procedure(s) Performed: MECHANICAL THROMBECTOMY OF LEFT COMMON ILIAC VEIN TO POPLITEAL VEIN (Left Leg Upper) VENOGRAM (Left Leg Upper) ULTRASOUND GUIDANCE FOR VASCULAR ACCESS OF LEFT POPLITEAL VEIN (Left Leg Lower) INTRAVASCULAR ULTRASOUND OF IVC, LEFT COMMON ILIAC VEIN, LEFT EXTERNAL ILIAC VEIN, LEFT SUPERFICAL FEMORAL VEIN, AND LEFT POPLITEAL VEIN (Left Leg Upper)  Patient Location: PACU  Anesthesia Type:General  Level of Consciousness: awake, alert , oriented and patient cooperative  Airway & Oxygen Therapy: Patient Spontanous Breathing and Patient connected to face mask oxygen  Post-op Assessment: Report given to RN and Post -op Vital signs reviewed and stable  Post vital signs: Reviewed and stable  Last Vitals:  Vitals Value Taken Time  BP 129/74 11/28/20 0957  Temp    Pulse 85 11/28/20 0958  Resp 11 11/28/20 0958  SpO2 98 % 11/28/20 0958  Vitals shown include unvalidated device data.  Last Pain:  Vitals:   11/28/20 0337  TempSrc: Oral  PainSc: 0-No pain      Patients Stated Pain Goal: 5 (20/10/07 1219)  Complications: No complications documented.

## 2020-11-28 NOTE — Progress Notes (Signed)
Pt received from pacu alert and oriented, left leg in compression wrap.  VS taken and CCMD notified.  No post op orders, PA E[place] paged.

## 2020-11-29 ENCOUNTER — Encounter (HOSPITAL_COMMUNITY): Payer: Self-pay | Admitting: Surgery

## 2020-11-29 LAB — CBC
HCT: 40.2 % (ref 39.0–52.0)
Hemoglobin: 13.2 g/dL (ref 13.0–17.0)
MCH: 28.1 pg (ref 26.0–34.0)
MCHC: 32.8 g/dL (ref 30.0–36.0)
MCV: 85.5 fL (ref 80.0–100.0)
Platelets: 295 10*3/uL (ref 150–400)
RBC: 4.7 MIL/uL (ref 4.22–5.81)
RDW: 12.7 % (ref 11.5–15.5)
WBC: 11 10*3/uL — ABNORMAL HIGH (ref 4.0–10.5)
nRBC: 0 % (ref 0.0–0.2)

## 2020-11-29 LAB — GLUCOSE, CAPILLARY
Glucose-Capillary: 157 mg/dL — ABNORMAL HIGH (ref 70–99)
Glucose-Capillary: 132 mg/dL — ABNORMAL HIGH (ref 70–99)

## 2020-11-29 LAB — HEPARIN LEVEL (UNFRACTIONATED)
Heparin Unfractionated: 0.77 IU/mL — ABNORMAL HIGH (ref 0.30–0.70)
Heparin Unfractionated: 0.6 IU/mL (ref 0.30–0.70)

## 2020-11-29 MED ORDER — ENOXAPARIN SODIUM 120 MG/0.8ML IJ SOSY
120.0000 mg | PREFILLED_SYRINGE | Freq: Once | INTRAMUSCULAR | Status: AC
  Administered 2020-11-29: 120 mg via SUBCUTANEOUS
  Filled 2020-11-29 (×2): qty 0.8

## 2020-11-29 NOTE — Plan of Care (Signed)
  Problem: Health Behavior/Discharge Planning: Goal: Ability to manage health-related needs will improve Outcome: Progressing   

## 2020-11-29 NOTE — Progress Notes (Signed)
ANTICOAGULATION CONSULT NOTE - Follow Up Consult  Pharmacy Consult for IV Heparin  Indication: LLE DVT, s/p attempted thrombectomy 4/26, PE  Allergies  Allergen Reactions  . Norco [Hydrocodone-Acetaminophen] Other (See Comments)    Paralysis-everything but breathing  . Penicillins Other (See Comments)    Did it involve swelling of the face/tongue/throat, SOB, or low BP? N/A Did it involve sudden or severe rash/hives, skin peeling, or any reaction on the inside of your mouth or nose? N/A Did you need to seek medical attention at a hospital or doctor's office? N/A When did it last happen?Child  If all above answers are "NO", may proceed with cephalosporin use.    Patient Measurements: Height: 5\' 11"  (180.3 cm) Weight: 113.4 kg (250 lb) IBW/kg (Calculated) : 75.3  Heparin Dosing Weight: 100 kg  Vital Signs: Temp: 98.9 F (37.2 C) (04/29 2325) Temp Source: Oral (04/29 2325) BP: 108/50 (04/29 2325) Pulse Rate: 66 (04/29 2325)  Labs: Recent Labs    11/26/20 0753 11/26/20 0942 11/26/20 1246 11/27/20 0537 11/28/20 0222 11/29/20 0113  HGB  --   --    < > 13.9 13.1 13.2  HCT  --   --   --  42.6 40.2 40.2  PLT  --   --   --  251 300 295  HEPARINUNFRC  --   --    < > 0.70 0.41 0.77*  TROPONINIHS 5 <2  --   --   --   --    < > = values in this interval not displayed.    Estimated Creatinine Clearance: 121.7 mL/min (by C-G formula based on SCr of 0.94 mg/dL).   Assessment: 50 yr old male with LLE DVT on Lovenox PTA was admitted for attempted thrombectomy 4/26.  Pharmacy was consulted for IV heparin dosing.Patient also with intermittent chest pain; 4/27 CT positive for RLL PE without RHS.   Heparin level now up to supratherapeutic (0.77) on gtt at 1850 units/hr. No bleeding noted.  Goal of Therapy:  Heparin level 0.3-0.7 units/ml Monitor platelets by anticoagulation protocol: Yes   Plan:  Decrease heparin infusion to 1750 units/hr  F/u 6 hr heparin level   Sherlon Handing, PharmD, BCPS Please see amion for complete clinical pharmacist phone list 11/29/2020 1:54 AM

## 2020-11-29 NOTE — Progress Notes (Addendum)
ANTICOAGULATION CONSULT NOTE - Follow Up Consult  Pharmacy Consult for IV Heparin  Indication: LLE DVT, s/p attempted thrombectomy 4/26, PE  Allergies  Allergen Reactions  . Norco [Hydrocodone-Acetaminophen] Other (See Comments)    Paralysis-everything but breathing  . Penicillins Other (See Comments)    Did it involve swelling of the face/tongue/throat, SOB, or low BP? N/A Did it involve sudden or severe rash/hives, skin peeling, or any reaction on the inside of your mouth or nose? N/A Did you need to seek medical attention at a hospital or doctor's office? N/A When did it last happen?Child  If all above answers are "NO", may proceed with cephalosporin use.    Patient Measurements: Height: 5\' 11"  (180.3 cm) Weight: 113.4 kg (250 lb) IBW/kg (Calculated) : 75.3  Heparin Dosing Weight: 100 kg  Vital Signs: Temp: 98.1 F (36.7 C) (04/30 0759) Temp Source: Oral (04/30 0759) BP: 108/66 (04/30 0759) Pulse Rate: 75 (04/30 0759)  Labs: Recent Labs    11/26/20 0942 11/26/20 1246 11/27/20 0537 11/28/20 0222 11/29/20 0113 11/29/20 0717  HGB  --    < > 13.9 13.1 13.2  --   HCT  --   --  42.6 40.2 40.2  --   PLT  --   --  251 300 295  --   HEPARINUNFRC  --    < > 0.70 0.41 0.77* 0.60  TROPONINIHS <2  --   --   --   --   --    < > = values in this interval not displayed.    Estimated Creatinine Clearance: 121.7 mL/min (by C-G formula based on SCr of 0.94 mg/dL).   Assessment: 50 yr old male with LLE DVT on Lovenox PTA was admitted for attempted thrombectomy 4/26.  Pharmacy was consulted for IV heparin dosing.Patient also with intermittent chest pain; 4/27 CT positive for RLL PE without RHS.   Heparin level now therapeutic (0.60) on gtt at 1750 units/hr. No bleeding noted. CBC stable.  Goal of Therapy:  Heparin level 0.3-0.7 units/ml Monitor platelets by anticoagulation protocol: Yes   Plan:  Continue heparin infusion at 1750 units/hr  F/u 6 hr confirmatory heparin  level   Kerby Nora, PharmD, BCPS Clinical Pharmacist Phone [phone]  Addendum: Patient is being discharged today, pharmacy consulted to give a dose of lovenox 120 mg now and stop IV heparin. Resumed lovenox 120 mg BID at discharge. First dose tonight to be given around 11pm.  Kerby Nora, PharmD, BCPS Clinical Pharmacist Phone [phone]  Please check [email or web address] for unit-specific pharmacist phone numbers

## 2020-11-29 NOTE — Progress Notes (Signed)
    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  I will remove his postoperative dressing in place and an 20-30 thigh-high compression.  I will also remove the suture from behind his knee.  I anticipate him being able to go home today.  I will give him a dose of Lovenox this morning and stop his heparin drip.  He has Lovenox injections at home.  The patient is having difficulty affording the Lovenox.  I would like for him to remain on this for at least a month and then he can have a conversation with his hematologist regarding other treatment options.  Wells Bryan Mcmillan 11/29/2020 9:24 AM --  Vitals:   11/29/20 0254 11/29/20 0759  BP: (!) 102/58 108/66  Pulse: 71 75  Resp: 18 16  Temp: 98.6 F (37 C) 98.1 F (36.7 C)  SpO2: 90% 100%    Intake/Output Summary (Last 24 hours) at 11/29/2020 0924 Last data filed at 11/29/2020 0400 Gross per 24 hour  Intake 1093.68 ml  Output 630 ml  Net 463.68 ml     Laboratory CBC    Component Value Date/Time   WBC 11.0 (H) 11/29/2020 0113   HGB 13.2 11/29/2020 0113   HGB 13.9 11/03/2020 1007   HCT 40.2 11/29/2020 0113   PLT 295 11/29/2020 0113   PLT 231 11/03/2020 1007    BMET    Component Value Date/Time   NA 137 11/26/2020 0139   K 3.7 11/26/2020 0139   CL 103 11/26/2020 0139   CO2 23 11/26/2020 0139   GLUCOSE 122 (H) 11/26/2020 0139   BUN 16 11/26/2020 0139   CREATININE 0.94 11/26/2020 0139   CREATININE 1.12 11/03/2020 1007   CALCIUM 9.4 11/26/2020 0139   GFRNONAA >60 11/26/2020 0139   GFRNONAA >60 11/03/2020 1007   GFRAA >60 02/18/2020 0939    COAG Lab Results  Component Value Date   INR 1.0 11/20/2020   INR 1.03 07/11/2013   INR 1.0 04/25/2013   No results found for: PTT  Antibiotics Anti-infectives (From  admission, onward)   Start     Dose/Rate Route Frequency Ordered Stop   11/28/20 0734  vancomycin (VANCOCIN) 1-5 GM/200ML-% IVPB       Note to Pharmacy: Willeen Cass   : cabinet override      11/28/20 0734 11/28/20 1944   11/25/20 2200  doxycycline (VIBRA-TABS) tablet 100 mg        100 mg Oral 2 times daily 11/25/20 1444         V. Leia Alf, M.D., [facility] [place] Office: [phone] Pager:  [phone]

## 2020-11-29 NOTE — Discharge Summary (Signed)
Discharge Summary  Patient ID: Bryan Mcmillan [id number] y.o. 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  He presented to the emergency department last week with new onset left leg swelling.  Ultrasound was consistent with DVT.  He comes in today for further evaluation and possible intervention  Hospital Course: 50 year old male who was admitted on 11/25/20 and underwent ultrasound guided access of left popliteal vein and left leg venogram by Dr. Trula Slade. Unfortunately this procedure was unsuccessful to thrombectomize the left lower extremity DVT. Visualization was very limited and the popliteal vein was unable to be cannulated. Additionally the patient began having chest pain and there was concern of PE so the case was stopped. Patient was restarted on heparin and was taken to the recovery room in stable condition.  POD#1 Some improvement of left leg overnight. No signs of phlegmasia. patient was having chest pain off and on overnight. No complaints of shortness of breath. Vitals remained stable. Due to concerns for PE CT chest was ordered as well as cardiac enzymes. EKG was obtained and was unremarkable. Patient otherwise remained hemodynamically stable   POD#2 continued improvement of left leg overnight. No signs of phlegmasia. CT of chest was positive for pulmonary embolism. Chest pain controlled with Tylenol. Heparin continued per pharmacy. Plan made for subsequent attempt at thrombectomy of left lower extremity. Patient was kept NPO over night.  POD#3 patient was taken  back to the operating room and underwent second attempt at mechanical thrombectomy. He had 1. U/s guided access, left popliteal vein 2. Left leg venogram and central venogram 3. IVUS left popliteal, femoral, common femoral , external iliac, and common iliac vein and inferior vena cava 4. Mechanical thrombectomy of left popliteal, femoral, common femoral , external iliac, and common iliac vein 5. Balloon venoplasty of left popliteal, femoral, common femoral , external iliac, and common iliac vein by Dr. Trula Slade. He tolerated the procedure well and was taken to the recovery room in stable condition. Heparin was restarted post intervention.   POD#4 Patient did well overnight. Left leg pain improved. His post operative dressings were taken down and suture from behind left knee removed. Thigh high 20-30 mmHg compression stockings were ordered. Patient remained stable for discharge home. He was given dose of Lovenox prior to discharge and heparin IV was discontinued. He will resume home Lovenox injections as prescribed. He will have follow up with hematologist for ongoing management as outpatient. He has follow up arranged with [place] in 1 month with iliac/ IVC  And left lower extremity venous duplex  Consults: None  Treatments: surgery: 1. U/s guided access, left popliteal vein 2. Left leg venogram and central venogram 3. IVUS left popliteal, femoral, common femoral , external iliac, and common iliac vein and inferior vena cava 4. Mechanical thrombectomy of left popliteal, femoral, common femoral , external iliac, and common iliac vein 5. Balloon venoplasty of left popliteal, femoral, common femoral , external iliac, and common iliac vein  Disposition: Discharge disposition: 01-Home or Self Care       Patient Instructions:  Allergies as of 11/29/2020      Reactions  Norco [hydrocodone-acetaminophen] Other (See Comments)   Paralysis-everything but breathing   Penicillins Other (See Comments)   Did it  involve swelling of the face/tongue/throat, SOB, or low BP? N/A Did it involve sudden or severe rash/hives, skin peeling, or any reaction on the inside of your mouth or nose? N/A Did you need to seek medical attention at a hospital or doctor's office? N/A When did it last happen?Child  If all above answers are "NO", may proceed with cephalosporin use.      Medication List    TAKE these medications   buPROPion 150 MG 24 hr tablet Commonly known as: WELLBUTRIN XL Take 3 tablets (450 mg total) by mouth daily.   canagliflozin 300 MG Tabs tablet Commonly known as: Invokana Take 1 tablet (300 mg total) by mouth daily.   doxycycline 100 MG tablet Commonly known as: ADOXA Take 1 tablet (100 mg total) by mouth 2 (two) times daily for 14 days.   enoxaparin 120 MG/0.8ML injection Commonly known as: Lovenox Inject 0.8 mLs (120 mg total) into the skin every 12 (twelve) hours.   lamoTRIgine 200 MG tablet Commonly known as: LAMICTAL Take 1 tablet (200 mg total) by mouth at bedtime.   LORazepam 0.5 MG tablet Commonly known as: ATIVAN Take 1 tablet (0.5 mg total) by mouth daily as needed for anxiety.   metFORMIN 500 MG 24 hr tablet Commonly known as: GLUCOPHAGE-XR Take 1 tablet (500 mg total) by mouth daily.   prazosin 5 MG capsule Commonly known as: MINIPRESS Take 1 capsule (5 mg total) by mouth at bedtime.   repaglinide 2 MG tablet Commonly known as: PRANDIN Take 1 tablet (2 mg total) by mouth 3 (three) times daily before meals.   Rybelsus 3 MG Tabs Generic drug: Semaglutide Take 3 mg by mouth daily.   traZODone 50 MG tablet Commonly known as: DESYREL TAKE 1 TABLET BY MOUTH AT BEDTIME AS NEEDED AND MAY REPEAT DOSE ONE TIME IF NEEDED FOR SLEEP. What changed: See the new instructions.   vortioxetine HBr 20 MG Tabs tablet Commonly known as: TRINTELLIX Take 1 tablet (20 mg total) by mouth daily.      Activity: activity as tolerated and no driving while on analgesics Diet:  regular diet Wound Care: keep wound clean and dry  Follow-up with Dr. Trula Slade in 1 month.  SignedKaroline Caldwell, PA-C 11/30/2020 10:30 AM [facility] Office: [phone]

## 2020-11-29 NOTE — Discharge Instructions (Signed)
  [name]  Discharge Instructions  Lower Extremity Angiogram; Angioplasty/Stenting  Please refer to the following instructions for your post-procedure care. Your surgeon or physician assistant will discuss any changes with you.  Activity  Avoid lifting more than 8 pounds (1 gallons of milk) for 5 days after your procedure. You may walk as much as you can tolerate. It's OK to drive after 72 hours.  Bathing/Showering  You may shower the day after your procedure. If you have a bandage, you may remove it at 24- 48 hours. Clean your incision site with mild soap and water. Pat the area dry with a clean towel.  Diet  Resume your pre-procedure diet. There are no special food restrictions following this procedure. All patients with peripheral vascular disease should follow a low fat/low cholesterol diet. In order to heal from your surgery, it is CRITICAL to get adequate nutrition. Your body requires vitamins, minerals, and protein. Vegetables are the best source of vitamins and minerals. Vegetables also provide the perfect balance of protein. Processed food has little nutritional value, so try to avoid this.  Medications  Resume taking all of your medications unless your doctor tells you not to. If your incision is causing pain, you may take over-the-counter pain relievers such as acetaminophen (Tylenol)  Follow Up  Follow up will be arranged at the time of your procedure. You may have an office visit scheduled or may be scheduled for surgery. Ask your surgeon if you have any questions.  Please call us immediately for any of the following conditions: .Severe or worsening pain your legs or feet at rest or with walking. .Increased pain, redness, drainage at your groin puncture site. .Fever of 101 degrees or higher. .If you have any mild or slow bleeding from your puncture site: lie down, apply firm constant pressure over the area with a piece of gauze or a  clean wash cloth for 30 minutes- no peeking!, call 911 right away if you are still bleeding after 30 minutes, or if the bleeding is heavy and unmanageable.  Reduce your risk factors of vascular disease:  . Stop smoking. If you would like help call [facility] at 1-800-QUIT-NOW (1-800-784-8669) or [facility] at 336-586-4000. . Manage your cholesterol . Maintain a desired weight . Control your diabetes . Keep your blood pressure down .  If you have any questions, please call the office at [phone] 

## 2020-12-02 LAB — POCT ACTIVATED CLOTTING TIME
Activated Clotting Time: 142 seconds
Activated Clotting Time: 196 seconds

## 2020-12-05 ENCOUNTER — Telehealth: Payer: Self-pay | Admitting: Hematology and Oncology

## 2020-12-05 ENCOUNTER — Inpatient Hospital Stay: Payer: 59 | Admitting: Physician Assistant

## 2020-12-05 ENCOUNTER — Inpatient Hospital Stay: Payer: 59

## 2020-12-05 ENCOUNTER — Telehealth: Payer: Self-pay | Admitting: Physician Assistant

## 2020-12-05 NOTE — Telephone Encounter (Signed)
Pt called back to r/s appts. Pt is aware of updated appts date and times.

## 2020-12-05 NOTE — Telephone Encounter (Signed)
Called pt to r/s appt per 5/6 sch msg. No answer and no vm available. Will try to call pt again later.

## 2020-12-08 ENCOUNTER — Encounter: Payer: Self-pay | Admitting: Physician Assistant

## 2020-12-08 ENCOUNTER — Other Ambulatory Visit: Payer: Self-pay

## 2020-12-08 ENCOUNTER — Inpatient Hospital Stay: Payer: 59 | Attending: Hematology and Oncology

## 2020-12-08 ENCOUNTER — Inpatient Hospital Stay (HOSPITAL_BASED_OUTPATIENT_CLINIC_OR_DEPARTMENT_OTHER): Payer: 59 | Admitting: Physician Assistant

## 2020-12-08 ENCOUNTER — Inpatient Hospital Stay: Payer: 59

## 2020-12-08 VITALS — BP 108/74 | HR 83 | Temp 97.5°F | Resp 16 | Ht 71.0 in | Wt 243.8 lb

## 2020-12-08 DIAGNOSIS — I2699 Other pulmonary embolism without acute cor pulmonale: Secondary | ICD-10-CM | POA: Diagnosis not present

## 2020-12-08 DIAGNOSIS — Z86718 Personal history of other venous thrombosis and embolism: Secondary | ICD-10-CM | POA: Diagnosis present

## 2020-12-08 DIAGNOSIS — E785 Hyperlipidemia, unspecified: Secondary | ICD-10-CM | POA: Diagnosis not present

## 2020-12-08 DIAGNOSIS — R5383 Other fatigue: Secondary | ICD-10-CM | POA: Diagnosis not present

## 2020-12-08 DIAGNOSIS — Z7984 Long term (current) use of oral hypoglycemic drugs: Secondary | ICD-10-CM | POA: Diagnosis not present

## 2020-12-08 DIAGNOSIS — E1140 Type 2 diabetes mellitus with diabetic neuropathy, unspecified: Secondary | ICD-10-CM | POA: Diagnosis not present

## 2020-12-08 DIAGNOSIS — I82402 Acute embolism and thrombosis of unspecified deep veins of left lower extremity: Secondary | ICD-10-CM | POA: Diagnosis not present

## 2020-12-08 DIAGNOSIS — Z87891 Personal history of nicotine dependence: Secondary | ICD-10-CM | POA: Insufficient documentation

## 2020-12-08 DIAGNOSIS — D689 Coagulation defect, unspecified: Secondary | ICD-10-CM

## 2020-12-08 DIAGNOSIS — Z79899 Other long term (current) drug therapy: Secondary | ICD-10-CM | POA: Insufficient documentation

## 2020-12-08 LAB — CBC WITH DIFFERENTIAL (CANCER CENTER ONLY)
Abs Immature Granulocytes: 0.05 10*3/uL (ref 0.00–0.07)
Basophils Absolute: 0 10*3/uL (ref 0.0–0.1)
Basophils Relative: 1 %
Eosinophils Absolute: 0.1 10*3/uL (ref 0.0–0.5)
Eosinophils Relative: 1 %
HCT: 44.1 % (ref 39.0–52.0)
Hemoglobin: 14.4 g/dL (ref 13.0–17.0)
Immature Granulocytes: 1 %
Lymphocytes Relative: 39 %
Lymphs Abs: 2.3 10*3/uL (ref 0.7–4.0)
MCH: 27.9 pg (ref 26.0–34.0)
MCHC: 32.7 g/dL (ref 30.0–36.0)
MCV: 85.5 fL (ref 80.0–100.0)
Monocytes Absolute: 0.4 10*3/uL (ref 0.1–1.0)
Monocytes Relative: 7 %
Neutro Abs: 3 10*3/uL (ref 1.7–7.7)
Neutrophils Relative %: 51 %
Platelet Count: 345 10*3/uL (ref 150–400)
RBC: 5.16 MIL/uL (ref 4.22–5.81)
RDW: 12.7 % (ref 11.5–15.5)
WBC Count: 5.9 10*3/uL (ref 4.0–10.5)
nRBC: 0 % (ref 0.0–0.2)

## 2020-12-08 LAB — CMP (CANCER CENTER ONLY)
ALT: 15 U/L (ref 0–44)
AST: 10 U/L — ABNORMAL LOW (ref 15–41)
Albumin: 3.9 g/dL (ref 3.5–5.0)
Alkaline Phosphatase: 65 U/L (ref 38–126)
Anion gap: 11 (ref 5–15)
BUN: 19 mg/dL (ref 6–20)
CO2: 24 mmol/L (ref 22–32)
Calcium: 9.4 mg/dL (ref 8.9–10.3)
Chloride: 104 mmol/L (ref 98–111)
Creatinine: 1.09 mg/dL (ref 0.61–1.24)
GFR, Estimated: >60 mL/min (ref >60)
Glucose, Bld: 212 mg/dL — ABNORMAL HIGH (ref 70–99)
Potassium: 4.2 mmol/L (ref 3.5–5.1)
Sodium: 139 mmol/L (ref 135–145)
Total Bilirubin: 0.3 mg/dL (ref 0.3–1.2)
Total Protein: 7.3 g/dL (ref 6.5–8.1)

## 2020-12-08 LAB — ANTITHROMBIN III: AntiThromb III Func: 91 % (ref 75–120)
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
## 2020-12-09 ENCOUNTER — Other Ambulatory Visit: Payer: Self-pay

## 2020-12-09 DIAGNOSIS — I825Y3 Chronic embolism and thrombosis of unspecified deep veins of proximal lower extremity, bilateral: Secondary | ICD-10-CM

## 2020-12-09 LAB — LUPUS ANTICOAGULANT PANEL
DRVVT: 52.2 s — ABNORMAL HIGH (ref 0.0–47.0)
PTT Lupus Anticoagulant: 46.2 s (ref 0.0–51.9)

## 2020-12-09 LAB — BETA-2-GLYCOPROTEIN I ABS, IGG/M/A
Beta-2 Glyco I IgG: <9 GPI IgG units (ref 0–20)
Beta-2-Glycoprotein I IgA: <9 GPI IgA units (ref 0–25)
Beta-2-Glycoprotein I IgM: 23 GPI IgM units (ref 0–32)

## 2020-12-09 LAB — DRVVT CONFIRM: dRVVT Confirm: 1.3 ratio — ABNORMAL HIGH (ref 0.8–1.2)

## 2020-12-09 LAB — DRVVT MIX: dRVVT Mix: 41.9 s — ABNORMAL HIGH (ref 0.0–40.4)

## 2020-12-10 ENCOUNTER — Telehealth: Payer: Self-pay | Admitting: *Deleted

## 2020-12-10 LAB — CARDIOLIPIN ANTIBODIES, IGG, IGM, IGA
Anticardiolipin IgA: <9 APL U/mL (ref 0–11)
Anticardiolipin IgG: <9 GPL U/mL (ref 0–14)
Anticardiolipin IgM: <9 MPL U/mL (ref 0–12)

## 2020-12-10 NOTE — Telephone Encounter (Signed)
Pt called to report concerns about numbness and tingling in great toe, 2nd toe and top of foot. Pt denies swelling and pain in leg and foot. He also reports there was a "knot" on the back of his knee that is better now. Moved up his post-op appt. Consulted Dr. Trula Slade who confirmed patient did not need to be seen urgently.

## 2020-12-12 LAB — FACTOR 5 LEIDEN

## 2020-12-12 LAB — PROTHROMBIN GENE MUTATION

## 2020-12-13 ENCOUNTER — Other Ambulatory Visit: Payer: Self-pay | Admitting: Endocrinology

## 2020-12-13 DIAGNOSIS — E1159 Type 2 diabetes mellitus with other circulatory complications: Secondary | ICD-10-CM

## 2020-12-15 ENCOUNTER — Other Ambulatory Visit: Payer: Self-pay

## 2020-12-15 ENCOUNTER — Ambulatory Visit: Payer: 59 | Admitting: Endocrinology

## 2020-12-15 ENCOUNTER — Telehealth: Payer: Self-pay | Admitting: Physician Assistant

## 2020-12-15 VITALS — BP 110/84 | HR 84 | Ht 71.0 in | Wt 248.2 lb

## 2020-12-15 DIAGNOSIS — E1159 Type 2 diabetes mellitus with other circulatory complications: Secondary | ICD-10-CM | POA: Diagnosis not present

## 2020-12-15 LAB — POCT GLYCOSYLATED HEMOGLOBIN (HGB A1C): Hemoglobin A1C: 8 % — AB (ref 4.0–5.6)

## 2020-12-15 MED ORDER — RYBELSUS 7 MG PO TABS: 7.0000 mg | ORAL_TABLET | Freq: Every day | ORAL | 3 refills | Status: DC

## 2020-12-15 MED ORDER — DAPAGLIFLOZIN PROPANEDIOL 10 MG PO TABS: 10.0000 mg | ORAL_TABLET | Freq: Every day | ORAL | 3 refills | Status: DC

## 2020-12-15 NOTE — Patient Instructions (Addendum)
I have sent 2 prescriptions to your pharmacy: to change Invokana to [place], and to increase the Rybelsus.   check your blood sugar once a day.  vary the time of day when you check, between before the 3 meals, and at bedtime.  also check if you have symptoms of your blood sugar being too high or too low.  please keep a record of the readings and bring it to your next appointment here (or you can bring the meter itself).  You can write it on any piece of paper.  please call us sooner if your blood sugar goes below 70, or if you have a lot of readings over 200. Please come back for a follow-up appointment in 2 months.

## 2020-12-15 NOTE — Telephone Encounter (Signed)
I called [name] to review the hypercoagulable workup that was done on 12/08/2020. Results did not indicate antiphospholipid antibody syndrome. Factor 5 Leiden and Prothrombin gene mutations were not detected. Antihrombin levels were within normal limits. At this time, the underlying etiology is unknown so the recommendation is indefinite anticoagulation and continue with Lovenox injections. No further workup is recommended at this time. Patient expressed understanding of the plan.

## 2020-12-15 NOTE — Progress Notes (Signed)
Subjective:    Patient ID: Bryan Mcmillan, male    DOB: April 12, 1971, 50 y.o.   MRN: [id number]  HPI Pt returns for f/u of DM:  DM type: 2 Dx'ed: 7829 Complications: none Therapy: 4 oral meds DKA: never Severe hypoglycemia: never.   Pancreatitis: never Other: he took insulin 2012-2015; he did not tolerate pioglitazone (edema); fructosamine has confirmed A1c; He does not want to add another injection to Lovenox.     Interval history: pt says he has not recently checked cbg.  pt states he feels well in general.  Ins declined Invokana.   Past Medical History:  Diagnosis Date  . Anxiety   . Asthma   . Depression   . Diabetes ([place])   . DVT (deep venous thrombosis), hx of recurrent 05/23/2012  . Headache(784.0)    frequent  . History of blood clot in brain, 2012 - followed by [facility] 05/23/2012  . History of blood clots   . History of suicidal tendencies   . Hyperlipemia 08/25/2012  . Hyperlipidemia   . Kidney disease   . Kidney stones   . Migraines   . Obesity   . Pneumonia   . Seizure ([place])   . Seizure disorder - followed by [facility] 05/23/2012  . Sleep apnea   . Tuberculosis     Past Surgical History:  Procedure Laterality Date  . BRAIN SURGERY    . filter for blood clots    . MECHANICAL THROMBECTOMY WITH AORTOGRAM AND INTERVENTION Left 11/28/2020   Procedure: MECHANICAL THROMBECTOMY OF LEFT COMMON ILIAC VEIN TO POPLITEAL VEIN;  Surgeon: Serafina Mitchell, MD;  Location: [place];  Service: Vascular;  Laterality: Left;  . PERIPHERAL VASCULAR THROMBECTOMY N/A 11/25/2020   Procedure: PERIPHERAL VASCULAR THROMBECTOMY;  Surgeon: Serafina Mitchell, MD;  Location: [place] [facility];  Service: Cardiovascular;  Laterality: N/A;  . TOE AMPUTATION Right 2012  . ULTRASOUND GUIDANCE FOR VASCULAR ACCESS Left 11/28/2020   Procedure: ULTRASOUND GUIDANCE FOR VASCULAR ACCESS OF LEFT POPLITEAL VEIN;  Surgeon: Serafina Mitchell, MD;  Location: [place];  Service: Vascular;   Laterality: Left;  Marland Kitchen VENOGRAM Left 11/28/2020   Procedure: VENOGRAM;  Surgeon: Serafina Mitchell, MD;  Location: [facility] OR;  Service: Vascular;  Laterality: Left;    Social History   Socioeconomic History  . Marital status: Divorced    Spouse name: Not on file  . Number of children: 1  . Years of education: BA  . Highest education level: Not on file  Occupational History  . Occupation: CUST SERV    Employer: [employer]  Tobacco Use  . Smoking status: Former Research scientist (life sciences)  . Smokeless tobacco: Never Used  Vaping Use  . Vaping Use: Never used  Substance and Sexual Activity  . Alcohol use: No  . Drug use: No  . Sexual activity: Never  Other Topics Concern  . 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 Worried About Charity fundraiser in the Last Year: Never true  . Ran Out of  Food in the Last Year: Never true  Transportation Needs: Not on file  Physical Activity: Not on file  Stress: Not on file  Social Connections: Not on file  Intimate Partner Violence: Not on file    Current Outpatient Medications on File Prior to Visit  Medication Sig Dispense Refill  . buPROPion (WELLBUTRIN XL) 150 MG 24 hr tablet Take 3 tablets (450 mg total) by mouth daily. 270 tablet 2  . enoxaparin (LOVENOX) 120 MG/0.8ML injection Inject 0.8 mLs (120 mg total) into the skin every 12 (twelve) hours. 48 mL 5  . hydrOXYzine (ATARAX/VISTARIL) 50 MG tablet Take 50 mg by mouth 2 (two) times daily.    Marland Kitchen lamoTRIgine (LAMICTAL) 200 MG tablet Take 1 tablet (200 mg total) by mouth at bedtime. 90 tablet 0  . LORazepam (ATIVAN) 0.5 MG tablet Take 1 tablet (0.5 mg total) by mouth daily  as needed for anxiety. 30 tablet 2  . metFORMIN (GLUCOPHAGE-XR) 500 MG 24 hr tablet Take 1 tablet (500 mg total) by mouth daily. 90 tablet 3  . prazosin (MINIPRESS) 5 MG capsule Take 1 capsule (5 mg total) by mouth at bedtime. 90 capsule 0  . repaglinide (PRANDIN) 2 MG tablet Take 1 tablet (2 mg total) by mouth 3 (three) times daily before meals. 270 tablet 4  . traZODone (DESYREL) 50 MG tablet TAKE 1 TABLET BY MOUTH AT BEDTIME AS NEEDED AND MAY REPEAT DOSE ONE TIME IF NEEDED FOR SLEEP. (Patient taking differently: Take 50 mg by mouth at bedtime as needed for sleep (may repeat dose one time if needed).) 180 tablet 1  . vortioxetine HBr (TRINTELLIX) 20 MG TABS tablet Take 1 tablet (20 mg total) by mouth daily. 30 tablet 2   No current facility-administered medications on file prior to visit.    Allergies  Allergen Reactions  . Norco [Hydrocodone-Acetaminophen] Other (See Comments)    Paralysis-everything but breathing  . Penicillins Other (See Comments)    Did it involve swelling of the face/tongue/throat, SOB, or low BP? N/A Did it involve sudden or severe rash/hives, skin peeling, or any reaction on the inside of your mouth or nose? N/A Did you need to seek medical attention at a hospital or doctor's office? N/A When did it last happen?Child  If all above answers are "NO", may proceed with cephalosporin use.    Family History  Problem Relation Age of Onset  . Hyperlipidemia Mother   . Depression Mother   . Heart disease Father   . Stroke Other        parent  . Diabetes Other        grandparent /parent  . Obesity Other   . Heart attack Other     BP 110/84 (BP Location: Right Arm, Patient Position: Sitting, Cuff Size: Large)   Pulse 84   Ht 5\' 11"  (1.803 m)   Wt 248 lb 3.2 oz (112.6 kg)   SpO2 97%   BMI 34.62 kg/m    Review of Systems     Objective:   Physical Exam VITAL SIGNS:  See vs page GENERAL: no distress Pulses: dorsalis pedis intact bilat.   MSK: no  deformity of the feet, except right toes 1-3 toes are absent.  CV: no leg edema Skin:  no ulcer on the feet.  normal color and temp on the feet.  Neuro: sensation is intact to touch on the feet.     A1c=8.0%    Assessment & Plan:  Type 2 DM: uncontrolled.  Patient Instructions  I have sent 2 prescriptions to your pharmacy: to change Invokana to [place], and to increase the Rybelsus.   check your blood sugar once a day.  vary the time of day when you check, between before the 3 meals, and at bedtime.  also check if you have symptoms of your blood sugar being too high or too low.  please keep a record of the readings and bring it to your next appointment here (or you can bring the meter itself).  You can write it on any piece of paper.  please call us sooner if your blood sugar goes below 70, or if you have a lot of readings over 200. Please come back for a follow-up appointment in 2 months.

## 2020-12-18 ENCOUNTER — Encounter: Payer: Self-pay | Admitting: Family Medicine

## 2020-12-23 NOTE — Progress Notes (Signed)
VASCULAR & VEIN SPECIALISTS           [facility] [place]  History and Physical   Bryan Mcmillan is a 50 y.o. male who presents for follow up for mechanical thrombectomy of left popliteal, femoral, common femoral, external and internal iliac veins on 11/28/2020 by Dr. Trula Slade.  On 11/25/2020, he had undergone attempt and thrombectomy but this was unsuccessful and he was brought back for the above procedure on 11/28/2020.    He has hx of multiple DVT's/PE and has developed DVT on therapeutic coumadin as well as Xarelto.  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  He does not have any family hx of clotting disorders.  He does wear knee high compression.  He states he did miss about a week of his anticoagulation due to miscommunication with the pharmacist.  He states most of the clots have been in the left leg but he has had clot in the right leg, heart, lungs and brain.  His previous clots were treated in [place]  He is followed by Dr. Lorenso Courier, hematologist.  He was seen on 12/08/2020 for continued workup.  He was continued on Lovenox.  His workup for hypercoagulable workup did not indicate antiphospholipid antibody syndrome.  factor 5 Leiden or prothrombin gene mutations were not detected and antithrombin levels were WNL.  Etiology of hypercoagulable state remains unknown and it was recommended to continue [facility] with Lovenox indefinitely.  No further workup at this time was recommended.   He returns today for follow up.  He does have some numbness and tenderness in the LLE since surgery.  He states that his swelling has improved.  His biggest complaint is since the surgery, he has pain and numbness from just above the ankle anteriorly to the big toe and 2nd toe. He states this started after the procedure.  He states he does have a little pain on the lateral aspect of the leg just below the knee.  He states that his incision has  been open and he has been showering, letting it air dry and putting neosporin and a bandaid on this daily.  He has not seen any pus.  He has not had any fevers and has not noticed an odor.    The pt is on a statin for cholesterol management.  The pt is not on a daily aspirin.   Other AC:  Lovenox The pt is not on medication for hypertension.   The pt is diabetic.   Tobacco hx:  former  He does not have family hx of AAA.  Past Medical History:  Diagnosis Date  . Anxiety   . Asthma   . Depression   . Diabetes ([place])   . DVT (deep venous thrombosis), hx of recurrent 05/23/2012  . Headache(784.0)    frequent  . History of blood clot in brain, 2012 - followed by [facility] 05/23/2012  . History of blood clots   . History of suicidal tendencies   . Hyperlipemia 08/25/2012  . Hyperlipidemia   . Kidney disease   . Kidney stones   . Migraines   . Obesity   . Pneumonia   . Seizure ([place])   . Seizure disorder - followed by [place] [facility] 05/23/2012  . Sleep apnea   . Tuberculosis     Past Surgical History:  Procedure Laterality Date  . BRAIN SURGERY    .  filter for blood clots    . MECHANICAL THROMBECTOMY WITH AORTOGRAM AND INTERVENTION Left 11/28/2020   Procedure: MECHANICAL THROMBECTOMY OF LEFT COMMON ILIAC VEIN TO POPLITEAL VEIN;  Surgeon: Serafina Mitchell, MD;  Location: [place];  Service: Vascular;  Laterality: Left;  . PERIPHERAL VASCULAR THROMBECTOMY N/A 11/25/2020   Procedure: PERIPHERAL VASCULAR THROMBECTOMY;  Surgeon: Serafina Mitchell, MD;  Location: [place] [facility];  Service: Cardiovascular;  Laterality: N/A;  . TOE AMPUTATION Right 2012  . ULTRASOUND GUIDANCE FOR VASCULAR ACCESS Left 11/28/2020   Procedure: ULTRASOUND GUIDANCE FOR VASCULAR ACCESS OF LEFT POPLITEAL VEIN;  Surgeon: Serafina Mitchell, MD;  Location: [place];  Service: Vascular;  Laterality: Left;  Marland Kitchen VENOGRAM Left 11/28/2020   Procedure: VENOGRAM;  Surgeon: Serafina Mitchell, MD;  Location: [facility] OR;  Service:  Vascular;  Laterality: Left;    Social History   Socioeconomic History  . Marital status: Divorced    Spouse name: Not on file  . Number of children: 1  . Years of education: BA  . Highest education level: Not on file  Occupational History  . Occupation: CUST SERV    Employer: [employer]  Tobacco Use  . Smoking status: Former Research scientist (life sciences)  . Smokeless tobacco: Never Used  Vaping Use  . Vaping Use: Never used  Substance and Sexual Activity  . Alcohol use: No  . Drug use: No  . Sexual activity: Never  Other Topics Concern  . 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 Worried About Charity fundraiser in the Last Year: Never true  . Ran Out of Food in the Last Year: Never true  Transportation Needs: Not on file  Physical Activity: Not on file  Stress: Not on file  Social Connections: Not on file  Intimate Partner Violence: Not on file     Family History  Problem Relation Age of Onset  . Hyperlipidemia Mother   . Depression Mother   . Heart disease Father   . Stroke Other        parent  . Diabetes Other        grandparent /parent  . Obesity Other   . Heart attack Other     Current Outpatient Medications  Medication Sig Dispense Refill  . buPROPion (WELLBUTRIN XL) 150 MG 24 hr tablet Take 3 tablets (450 mg total) by mouth daily. 270 tablet 2  . dapagliflozin propanediol (FARXIGA) 10 MG TABS tablet Take 1 tablet (10 mg total) by mouth daily before breakfast. 90 tablet 3  . enoxaparin (LOVENOX) 120 MG/0.8ML injection Inject 0.8 mLs (120 mg total) into the skin every 12 (twelve) hours. 48 mL 5  . hydrOXYzine (ATARAX/VISTARIL) 50 MG  tablet Take 50 mg by mouth 2 (two) times daily.    Marland Kitchen lamoTRIgine (LAMICTAL) 200 MG tablet Take 1 tablet (200 mg total) by mouth at bedtime. 90 tablet 0  . LORazepam (ATIVAN) 0.5 MG tablet Take 1 tablet (0.5 mg total) by mouth daily as needed for anxiety. 30 tablet 2  . metFORMIN (  GLUCOPHAGE-XR) 500 MG 24 hr tablet Take 1 tablet (500 mg total) by mouth daily. 90 tablet 3  . prazosin (MINIPRESS) 5 MG capsule Take 1 capsule (5 mg total) by mouth at bedtime. 90 capsule 0  . repaglinide (PRANDIN) 2 MG tablet Take 1 tablet (2 mg total) by mouth 3 (three) times daily before meals. 270 tablet 4  . Semaglutide (RYBELSUS) 7 MG TABS Take 7 mg by mouth daily. 90 tablet 3  . traZODone (DESYREL) 50 MG tablet TAKE 1 TABLET BY MOUTH AT BEDTIME AS NEEDED AND MAY REPEAT DOSE ONE TIME IF NEEDED FOR SLEEP. (Patient taking differently: Take 50 mg by mouth at bedtime as needed for sleep (may repeat dose one time if needed).) 180 tablet 1  . vortioxetine HBr (TRINTELLIX) 20 MG TABS tablet Take 1 tablet (20 mg total) by mouth daily. 30 tablet 2   No current facility-administered medications for this visit.    Allergies  Allergen Reactions  . Norco [Hydrocodone-Acetaminophen] Other (See Comments)    Paralysis-everything but breathing  . Penicillins Other (See Comments)    Did it involve swelling of the face/tongue/throat, SOB, or low BP? N/A Did it involve sudden or severe rash/hives, skin peeling, or any reaction on the inside of your mouth or nose? N/A Did you need to seek medical attention at a hospital or doctor's office? N/A When did it last happen?Child  If all above answers are "NO", may proceed with cephalosporin use.    REVIEW OF SYSTEMS:   [X]  denotes positive finding, [ ]  denotes negative finding Cardiac  Comments:  Chest pain or chest pressure:    Shortness of breath upon exertion:    Short of breath when lying flat:    Irregular heart rhythm:        Vascular    Pain in calf, thigh, or hip  brought on by ambulation:    Pain in feet at night that wakes you up from your sleep:     Blood clot in your veins: x   Leg swelling:  x       Pulmonary    Oxygen at home:    Productive cough:     Wheezing:         Neurologic    Sudden weakness in arms or legs:     Sudden numbness in arms or legs:     Sudden onset of difficulty speaking or slurred speech:    Temporary loss of vision in one eye:     Problems with dizziness:         Gastrointestinal    Blood in stool:     Vomited blood:         Genitourinary    Burning when urinating:     Blood in urine:        Psychiatric    Major depression:         Hematologic    Bleeding problems:    Problems with blood clotting too easily:        Skin    Rashes or ulcers:        Constitutional    Fever or chills:      PHYSICAL EXAMINATION:  Today's Vitals   12/25/20 0915  BP: 106/70  Pulse: 76  Resp: 20  Temp: 98.2 F (36.8 C)  TempSrc: Temporal  SpO2: 97%  Weight: 249 lb 12.8 oz (113.3 kg)  Height: 5\' 11"  (1.803 m)  PainSc: 7    Body mass index is 34.84  kg/m.   General:  WDWN in NAD; vital signs documented above Gait: Normal HENT: WNL, normocephalic Pulmonary: normal non-labored breathing without wheezing Cardiac: regular HR; without carotid bruits Abdomen: soft, NT, no masses; aortic pulse is not palpable Skin: without rashes    Vascular Exam/Pulses: Easily palpable PT pulses bilaterally.  Left thigh and lower leg swelling much improved since hospitalization although still with some swelling Neurologic: A&O X 3;  moving all extremities equally Psychiatric:  The pt has Normal affect.   Non-Invasive Vascular Imaging:   Venous duplex on 12/25/2020: IVC/Iliac Findings:  +----------+------+--------+--------+    IVC  PatentThrombusComments  +----------+------+--------+--------+  IVC Prox patent          +----------+------+--------+--------+  IVC Mid  patent           +----------+------+--------+--------+  IVC Distalpatent          +----------+------+--------+--------+   +-------------------+---------+-----------+---------+-----------+--------+      CIV    RT-PatentRT-ThrombusLT-PatentLT-ThrombusComments  +-------------------+---------+-----------+---------+-----------+--------+  Common Iliac Prox  patent        patent             +-------------------+---------+-----------+---------+-----------+--------+  Common Iliac Mid   patent        patent             +-------------------+---------+-----------+---------+-----------+--------+  Common Iliac Distal patent        patent             +-------------------+---------+-----------+---------+-----------+--------+   +-------------------------+---------+-----------+---------+-----------+----        EIV       RT-PatentRT-ThrombusLT-PatentLT-ThrombusComments  +-------------------------+---------+-----------+---------+-----------+----  External Iliac Vein Prox  patent        patent           +-------------------------+---------+-----------+---------+-----------+----   External Iliac Vein Mid  patent        patent           +-------------------------+---------+-----------+---------+-----------+----  External Iliac Vein    patent        patent           Distal                                    +-------------------------+---------+-----------+---------+-----------+----   Summary:  IVC/Iliac: There is no evidence of obvious thrombus involving the IVC.   LEFT:  - Left lower extremity DVT involving the common femoral vein to the  popliteal vein, probably chronic. A short segment of noncompressible  Greater Saphenous vein mid calf area suggestive of a history of  superficial  thrombophlebitis.   Bryan Mcmillan is a 50 y.o. male who is s/p mechanical thrombectomy of left popliteal, femoral, common femoral, external and internal iliac veins on 11/28/2020 by Dr. Trula Slade  -pt has easily palpable PT pulses.  Swelling in LLE much improved since procedure.   Superficial dehiscence of incision in left popliteal space.  No evidence of infection.  Instructed him to continue with cleaning with soap and water, applying neosporin and bandaid daily.  He knows to call sooner if he has any increased drainage or fevers. -pt with new pain and numbness over the top of his foot, great toe and 2nd toe since procedure.  Possibly from nerve irritation but difficult to determine.  Will have pt f/u in 3 weeks to check his incision and see Dr. Trula Slade to discuss this issue.  Pt is in agreement with this.    -duplex today reveals that the IVC and iliac veins are patent.  He still has some chronic thrombus in the common femoral vein to the popliteal vein that is chronic.    -discussed with pt to continue wearing his compression stockings.  He states that his thigh high are uncomfortable so discussed with him that he can continue with his knee high compression at this point and if he finds he gets better relief with the thigh high, we can measure him when he returns and get him a pair at that time.  -discussed the importance of leg elevation and how to elevate properly - pt is advised to elevate their legs and a diagram is given to them to demonstrate to lay flat on their back with knees elevated and slightly bent with their feet higher than her knees, which puts their feet higher than their heart for 15 minutes per day.  If they cannot lay flat, advised to lay as flat as possible.  -pt is advised to  avoid sitting or standing for long periods of time.  -discussed importance of weight loss and exercise  -handout with recommendations given -pt will f/u 6 months with IVC and LLE venous duplex.   He will call sooner if he has any issues.    Leontine Locket, [facility] 12/23/2020 9:14 AM  Clinic MD:  Scot Dock

## 2020-12-25 ENCOUNTER — Ambulatory Visit (INDEPENDENT_AMBULATORY_CARE_PROVIDER_SITE_OTHER)
Admission: RE | Admit: 2020-12-25 | Discharge: 2020-12-25 | Disposition: A | Discharge status: Home / Self Care | Payer: 59 | Source: Ambulatory Visit | Attending: Surgery | Admitting: Surgery

## 2020-12-25 ENCOUNTER — Other Ambulatory Visit: Payer: Self-pay

## 2020-12-25 ENCOUNTER — Ambulatory Visit (INDEPENDENT_AMBULATORY_CARE_PROVIDER_SITE_OTHER): Payer: 59 | Admitting: Physician Assistant

## 2020-12-25 ENCOUNTER — Ambulatory Visit (HOSPITAL_COMMUNITY)
Admission: RE | Admit: 2020-12-25 | Discharge: 2020-12-25 | Disposition: A | Discharge status: Home / Self Care | Payer: 59 | Source: Ambulatory Visit | Attending: Vascular Surgery | Admitting: Vascular Surgery

## 2020-12-25 VITALS — BP 106/70 | HR 76 | Temp 98.2°F | Resp 20 | Ht 71.0 in | Wt 249.8 lb

## 2020-12-25 DIAGNOSIS — I825Y3 Chronic embolism and thrombosis of unspecified deep veins of proximal lower extremity, bilateral: Secondary | ICD-10-CM | POA: Insufficient documentation

## 2021-01-05 ENCOUNTER — Encounter (HOSPITAL_COMMUNITY): Payer: 59

## 2021-01-12 ENCOUNTER — Encounter: Payer: Self-pay | Admitting: Surgery

## 2021-01-12 ENCOUNTER — Ambulatory Visit (INDEPENDENT_AMBULATORY_CARE_PROVIDER_SITE_OTHER): Payer: 59 | Admitting: Surgery

## 2021-01-12 ENCOUNTER — Other Ambulatory Visit: Payer: Self-pay

## 2021-01-12 VITALS — BP 98/66 | HR 81 | Temp 98.2°F | Resp 20 | Ht 71.0 in | Wt 258.0 lb

## 2021-01-12 DIAGNOSIS — I825Y3 Chronic embolism and thrombosis of unspecified deep veins of proximal lower extremity, bilateral: Secondary | ICD-10-CM | POA: Diagnosis not present

## 2021-01-12 NOTE — Progress Notes (Signed)
[facility] [place]  Patient name: Bryan Mcmillan MRN: [id number] DOB: Jan 22, 1971 Sex: male   REASON FOR VISIT:    Follow up  HISOTRY OF PRESENT ILLNESS:    Bryan Mcmillan is a 50 y.o. male who presented to the hospital on 11/21/2020 with a painful swollen left lower extremity.  He has a history of multiple DVTs and PE and developed a DVT on therapeutic Coumadin as well as Xarelto.  He has a history of IVC filter placement in 2012 and 2018, both of which have been removed.  His prior interventions have been in [place] and [place].  On 11/25/2020 he underwent an attempt at thrombolysis which was unsuccessful secondary to the inability to cannulate his vein.  He was brought back several days later and then underwent successful mechanical thrombectomy of the left popliteal, femoral, common femoral, external iliac, and common iliac vein as well as subsequent balloon venoplasty.  He is followed by Dr. Lorenso Courier, his hematologist.  He remains on Lovenox the etiology of his hypercoagulable state is unknown.  Lovenox has been recommended indefinitely.  Patient was seen in the office a few weeks ago stating that his swelling has improved however he did have pain and numbness from just above the ankle anteriorly down to the great and second toe.  This began after the procedure.  His popliteal incision had also separated.  We had a duplex that showed his iliac and vena cava was widely patent with some chronic thrombus in the common femoral vein to the popliteal vein.   PAST MEDICAL HISTORY:   Past Medical History:  Diagnosis Date   Anxiety    Asthma    Depression    Diabetes ([place])    DVT (deep venous thrombosis), hx of recurrent 05/23/2012   Headache(784.0)    frequent   History of blood clot in brain, 2012 - followed by [facility] 05/23/2012   History of blood clots    History of suicidal tendencies     Hyperlipemia 08/25/2012   Hyperlipidemia    Kidney disease    Kidney stones    Migraines    Obesity    Pneumonia    Seizure ([place])    Seizure disorder - followed by [facility] 05/23/2012   Sleep apnea    Tuberculosis      FAMILY HISTORY:   Family History  Problem Relation Age of Onset   Hyperlipidemia Mother    Depression Mother    Heart disease Father    Stroke Other        parent   Diabetes Other        grandparent /parent   Obesity Other    Heart attack Other     SOCIAL HISTORY:   Social History   Tobacco Use   Smoking status: Former    Pack years: 0.00   Smokeless tobacco: Never  Substance Use Topics   Alcohol use: No     ALLERGIES:   Allergies  Allergen Reactions   Norco [Hydrocodone-Acetaminophen] Other (See Comments)    Paralysis-everything but breathing   Penicillins Other (See Comments)    Did it involve swelling of the face/tongue/throat, SOB, or low BP? N/A Did it involve sudden or severe rash/hives, skin peeling, or any reaction on the inside of your mouth or nose? N/A Did you need to seek medical attention at a hospital or doctor's office? N/A When did it last happen? Child  If all above answers are "NO", may proceed  with cephalosporin use.     CURRENT MEDICATIONS:   Current Outpatient Medications  Medication Sig Dispense Refill   dapagliflozin propanediol (FARXIGA) 10 MG TABS tablet Take 1 tablet (10 mg total) by mouth daily before breakfast. 90 tablet 3   enoxaparin (LOVENOX) 120 MG/0.8ML injection Inject 0.8 mLs (120 mg total) into the skin every 12 (twelve) hours. 48 mL 5   hydrOXYzine (ATARAX/VISTARIL) 50 MG tablet Take 50 mg by mouth 2 (two) times daily.     metFORMIN (GLUCOPHAGE-XR) 500 MG 24 hr tablet Take 1 tablet (500 mg total) by mouth daily. 90 tablet 3   prazosin (MINIPRESS) 5 MG capsule Take 1 capsule (5 mg total) by mouth at bedtime. 90 capsule 0   repaglinide (PRANDIN) 2 MG tablet Take 1 tablet (2 mg total) by mouth 3  (three) times daily before meals. 270 tablet 4   Semaglutide (RYBELSUS) 7 MG TABS Take 7 mg by mouth daily. 90 tablet 3   traZODone (DESYREL) 50 MG tablet TAKE 1 TABLET BY MOUTH AT BEDTIME AS NEEDED AND MAY REPEAT DOSE ONE TIME IF NEEDED FOR SLEEP. (Patient taking differently: Take 50 mg by mouth at bedtime as needed for sleep (may repeat dose one time if needed).) 180 tablet 1   buPROPion (WELLBUTRIN XL) 150 MG 24 hr tablet Take 3 tablets (450 mg total) by mouth daily. 270 tablet 2   lamoTRIgine (LAMICTAL) 200 MG tablet Take 1 tablet (200 mg total) by mouth at bedtime. 90 tablet 0   No current facility-administered medications for this visit.    REVIEW OF SYSTEMS:   [X]  denotes positive finding, [ ]  denotes negative finding Cardiac  Comments:  Chest pain or chest pressure:    Shortness of breath upon exertion:    Short of breath when lying flat:    Irregular heart rhythm:        Vascular    Pain in calf, thigh, or hip brought on by ambulation:    Pain in feet at night that wakes you up from your sleep:     Blood clot in your veins:    Leg swelling:         Pulmonary    Oxygen at home:    Productive cough:     Wheezing:         Neurologic    Sudden weakness in arms or legs:     Sudden numbness in arms or legs:     Sudden onset of difficulty speaking or slurred speech:    Temporary loss of vision in one eye:     Problems with dizziness:         Gastrointestinal    Blood in stool:     Vomited blood:         Genitourinary    Burning when urinating:     Blood in urine:        Psychiatric    Major depression:         Hematologic    Bleeding problems:    Problems with blood clotting too easily:        Skin    Rashes or ulcers:        Constitutional    Fever or chills:      PHYSICAL EXAM:   Vitals:   01/12/21 0819  BP: 98/66  Pulse: 81  Resp: 20  Temp: 98.2 F (36.8 C)  SpO2: 96%  Weight: 258 lb (117 kg)  Height: 5\' 11"  (1.803 m)  GENERAL: The  patient is a well-nourished male, in no acute distress. The vital signs are documented above. CARDIAC: There is a regular rate and rhythm.  VASCULAR: No significant swelling.  The popliteal incision is healing PULMONARY: Non-labored respirations ABDOMEN: Soft and non-tender with normal pitched bowel sounds.  MUSCULOSKELETAL: There are no major deformities or cyanosis. NEUROLOGIC: No focal weakness or paresthesias are detected. SKIN: There are no ulcers or rashes noted. PSYCHIATRIC: The patient has a normal affect.  STUDIES:   I have reviewed the following: IVC/Iliac: There is no evidence of obvious thrombus involving MEDICAL ISSUES:   Recurrent DVT: The patient has undergone successful mechanical thrombectomy and venoplasty.  No stent was placed as there did not appear to be any extrinsic compression of the veins.  He is complaining of some numbness around his first and second left toes.  This was likely secondary to nerve irritation from the difficulties I had with venous cannulation.  I am hopeful that this will resolve however told him that it may not and it would likely take time.  He is scheduled for follow-up in 6 months with repeat ileal caval duplex and lower extremity duplex.  Hypercoagulable state: The patient sees Dr. Lorenso Courier.  There is no obvious etiology.  It is recommended that he remains on lifelong Lovenox.  I did discuss speaking with the hematologist who specializes in hypercoagulable state at [facility].  I suggested that he talk with Dr. Lorenso Courier regarding the possibility of exploring this option.    Leia Alf, MD, FACS [facility] [phone] Pager [phone]

## 2021-01-13 ENCOUNTER — Other Ambulatory Visit: Payer: Self-pay

## 2021-01-13 DIAGNOSIS — I825Y3 Chronic embolism and thrombosis of unspecified deep veins of proximal lower extremity, bilateral: Secondary | ICD-10-CM

## 2021-02-23 ENCOUNTER — Other Ambulatory Visit: Payer: Self-pay

## 2021-02-23 ENCOUNTER — Ambulatory Visit (INDEPENDENT_AMBULATORY_CARE_PROVIDER_SITE_OTHER): Payer: 59 | Admitting: Endocrinology

## 2021-02-23 VITALS — BP 116/76 | HR 93 | Ht 71.0 in | Wt 258.8 lb

## 2021-02-23 DIAGNOSIS — E1165 Type 2 diabetes mellitus with hyperglycemia: Secondary | ICD-10-CM

## 2021-02-23 DIAGNOSIS — E1159 Type 2 diabetes mellitus with other circulatory complications: Secondary | ICD-10-CM | POA: Diagnosis not present

## 2021-02-23 LAB — POCT GLYCOSYLATED HEMOGLOBIN (HGB A1C): Hemoglobin A1C: 10.5 % — AB (ref 4.0–5.6)

## 2021-02-23 MED ORDER — RYBELSUS 14 MG PO TABS: 14.0000 mg | ORAL_TABLET | Freq: Every day | ORAL | 3 refills | Status: DC

## 2021-02-23 MED ORDER — DAPAGLIFLOZIN PROPANEDIOL 10 MG PO TABS: 10.0000 mg | ORAL_TABLET | Freq: Every day | ORAL | 3 refills | Status: DC

## 2021-02-23 NOTE — Progress Notes (Signed)
Subjective:    Patient ID: Bryan Mcmillan, male    DOB: [birth year], 50 y.o.   MRN: [id number]  HPI Pt returns for f/u of DM:  DM type: 2 Dx'ed: 0000000 Complications: none Therapy: 4 oral meds DKA: never Severe hypoglycemia: never.   Pancreatitis: never.   Other: he took insulin 2012-2015; he did not tolerate pioglitazone (edema); fructosamine has confirmed A1c; He does not want to add another injection to Lovenox.    Interval history: pt says he has not recently checked cbg.  pt states he feels well in general.  He does not take [place].   Past Medical History:  Diagnosis Date   Anxiety    Asthma    Depression    Diabetes ([place])    DVT (deep venous thrombosis), hx of recurrent 05/23/2012   Headache(784.0)    frequent   History of blood clot in brain, 2012 - followed by [facility] 05/23/2012   History of blood clots    History of suicidal tendencies    Hyperlipemia 08/25/2012   Hyperlipidemia    Kidney disease    Kidney stones    Migraines    Obesity    Pneumonia    Seizure ([place])    Seizure disorder - followed by [facility] 05/23/2012   Sleep apnea    Tuberculosis     Past Surgical History:  Procedure Laterality Date   BRAIN SURGERY     filter for blood clots     MECHANICAL THROMBECTOMY WITH AORTOGRAM AND INTERVENTION Left 11/28/2020   Procedure: MECHANICAL THROMBECTOMY OF LEFT COMMON ILIAC VEIN TO POPLITEAL VEIN;  Surgeon: Serafina Mitchell, MD;  Location: [facility];  Service: Vascular;  Laterality: Left;   PERIPHERAL VASCULAR THROMBECTOMY N/A 11/25/2020   Procedure: PERIPHERAL VASCULAR THROMBECTOMY;  Surgeon: Serafina Mitchell, MD;  Location: [facility];  Service: Cardiovascular;  Laterality: N/A;   TOE AMPUTATION Right 2012   ULTRASOUND GUIDANCE FOR VASCULAR ACCESS Left 11/28/2020   Procedure: ULTRASOUND GUIDANCE FOR VASCULAR ACCESS OF LEFT POPLITEAL VEIN;  Surgeon: Serafina Mitchell, MD;  Location: [facility];  Service: Vascular;  Laterality: Left;    VENOGRAM Left 11/28/2020   Procedure: VENOGRAM;  Surgeon: Serafina Mitchell, MD;  Location: [facility] OR;  Service: Vascular;  Laterality: Left;    Social History   Socioeconomic History   Marital status: Divorced    Spouse name: Not on file   Number of children: 1   Years of education: BA   Highest education level: Not on file  Occupational History   Occupation: CUST SERV    Employer: [employer]  Tobacco Use   Smoking status: Former   Smokeless tobacco: Never  Scientific laboratory technician Use: Never used  Substance and Sexual Activity   Alcohol use: No   Drug use: No   Sexual activity: Never  Other Topics Concern   Not on file  Social History Narrative   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[employer], 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      Spiritual Beliefs:      Lifestyle: trying to walk and working on diet      Caffeine Use: does consume         Social Determinants of Radio broadcast assistant Strain: Not on file  Food Insecurity: No Food Insecurity   Worried About Charity fundraiser in the Last Year: Never true   [place]  of Food in the Last Year: Never true  Transportation Needs: Not on file  Physical Activity: Not on file  Stress: Not on file  Social Connections: Not on file  Intimate Partner Violence: Not on file    Current Outpatient Medications on File Prior to Visit  Medication Sig Dispense Refill   enoxaparin (LOVENOX) 120 MG/0.8ML injection Inject 0.8 mLs (120 mg total) into the skin every 12 (twelve) hours. 48 mL 5   hydrOXYzine (ATARAX/VISTARIL) 50 MG tablet Take 50 mg by mouth 2 (two) times daily.     metFORMIN (GLUCOPHAGE-XR) 500 MG 24 hr tablet Take 1 tablet (500 mg total) by mouth daily. 90 tablet 3   repaglinide (PRANDIN) 2 MG tablet Take 1 tablet (2 mg total) by mouth 3 (three) times daily before meals. 270 tablet 4   traZODone (DESYREL) 50 MG tablet TAKE 1 TABLET BY MOUTH AT BEDTIME AS NEEDED AND MAY REPEAT DOSE ONE TIME IF  NEEDED FOR SLEEP. (Patient taking differently: Take 50 mg by mouth at bedtime as needed for sleep (may repeat dose one time if needed).) 180 tablet 1   buPROPion (WELLBUTRIN XL) 150 MG 24 hr tablet Take 3 tablets (450 mg total) by mouth daily. 270 tablet 2   lamoTRIgine (LAMICTAL) 200 MG tablet Take 1 tablet (200 mg total) by mouth at bedtime. 90 tablet 0   prazosin (MINIPRESS) 5 MG capsule Take 1 capsule (5 mg total) by mouth at bedtime. 90 capsule 0   No current facility-administered medications on file prior to visit.     Family History  Problem Relation Age of Onset   Hyperlipidemia Mother    Depression Mother    Heart disease Father    Stroke Other        parent   Diabetes Other        grandparent /parent   Obesity Other    Heart attack Other     BP 116/76 (BP Location: Right Arm, Patient Position: Sitting, Cuff Size: Large)   Pulse 93   Ht '5\' 11"'$  (1.803 m)   Wt 258 lb 12.8 oz (117.4 kg)   SpO2 95%   BMI 36.10 kg/m    Review of Systems     Objective:   Physical Exam Pulses: dorsalis pedis intact bilat.   MSK: no deformity of the feet, except right toes 1-3 toes are absent.  CV: no leg edema Skin:  no ulcer on the feet.  normal color and temp on the feet.  Neuro: sensation is intact to touch on the feet.     Lab Results  Component Value Date   HGBA1C 10.5 (A) 02/23/2021      Assessment & Plan:  Type 2 DM: uncontrolled.   Patient Instructions  I have sent 2 prescriptions to your pharmacy: for the [place], and to increase the Rybelsus again.  check your blood sugar once a day.  vary the time of day when you check, between before the 3 meals, and at bedtime.  also check if you have symptoms of your blood sugar being too high or too low.  please keep a record of the readings and bring it to your next appointment here (or you can bring the meter itself).  You can write it on any piece of paper.  please call us sooner if your blood sugar goes below 70, or if you  have a lot of readings over 200. Please come back for a follow-up appointment in 2 months.

## 2021-02-23 NOTE — Patient Instructions (Signed)
I have sent 2 prescriptions to your pharmacy: for the [place], and to increase the Rybelsus again.  check your blood sugar once a day.  vary the time of day when you check, between before the 3 meals, and at bedtime.  also check if you have symptoms of your blood sugar being too high or too low.  please keep a record of the readings and bring it to your next appointment here (or you can bring the meter itself).  You can write it on any piece of paper.  please call us sooner if your blood sugar goes below 70, or if you have a lot of readings over 200. Please come back for a follow-up appointment in 2 months.

## 2021-03-09 ENCOUNTER — Encounter: Payer: Self-pay | Admitting: Hematology and Oncology

## 2021-03-09 ENCOUNTER — Other Ambulatory Visit: Payer: Self-pay

## 2021-03-09 ENCOUNTER — Telehealth: Payer: Self-pay | Admitting: Endocrinology

## 2021-03-09 ENCOUNTER — Inpatient Hospital Stay: Payer: 59 | Attending: Hematology and Oncology

## 2021-03-09 ENCOUNTER — Other Ambulatory Visit: Payer: Self-pay | Admitting: Hematology and Oncology

## 2021-03-09 ENCOUNTER — Inpatient Hospital Stay (HOSPITAL_BASED_OUTPATIENT_CLINIC_OR_DEPARTMENT_OTHER): Payer: 59 | Admitting: Hematology and Oncology

## 2021-03-09 VITALS — BP 110/79 | HR 81 | Temp 97.9°F | Resp 18 | Ht 71.0 in | Wt 251.7 lb

## 2021-03-09 DIAGNOSIS — Z79899 Other long term (current) drug therapy: Secondary | ICD-10-CM | POA: Diagnosis not present

## 2021-03-09 DIAGNOSIS — Z86718 Personal history of other venous thrombosis and embolism: Secondary | ICD-10-CM | POA: Diagnosis present

## 2021-03-09 DIAGNOSIS — E1165 Type 2 diabetes mellitus with hyperglycemia: Secondary | ICD-10-CM | POA: Diagnosis not present

## 2021-03-09 DIAGNOSIS — E669 Obesity, unspecified: Secondary | ICD-10-CM | POA: Diagnosis not present

## 2021-03-09 DIAGNOSIS — G40909 Epilepsy, unspecified, not intractable, without status epilepticus: Secondary | ICD-10-CM | POA: Diagnosis not present

## 2021-03-09 DIAGNOSIS — Z7984 Long term (current) use of oral hypoglycemic drugs: Secondary | ICD-10-CM | POA: Diagnosis not present

## 2021-03-09 DIAGNOSIS — I82402 Acute embolism and thrombosis of unspecified deep veins of left lower extremity: Secondary | ICD-10-CM

## 2021-03-09 DIAGNOSIS — G4730 Sleep apnea, unspecified: Secondary | ICD-10-CM | POA: Diagnosis not present

## 2021-03-09 DIAGNOSIS — Z87442 Personal history of urinary calculi: Secondary | ICD-10-CM | POA: Insufficient documentation

## 2021-03-09 DIAGNOSIS — E785 Hyperlipidemia, unspecified: Secondary | ICD-10-CM | POA: Insufficient documentation

## 2021-03-09 DIAGNOSIS — D689 Coagulation defect, unspecified: Secondary | ICD-10-CM | POA: Diagnosis not present

## 2021-03-09 DIAGNOSIS — Z7901 Long term (current) use of anticoagulants: Secondary | ICD-10-CM | POA: Insufficient documentation

## 2021-03-09 DIAGNOSIS — I2699 Other pulmonary embolism without acute cor pulmonale: Secondary | ICD-10-CM | POA: Diagnosis not present

## 2021-03-09 DIAGNOSIS — E1159 Type 2 diabetes mellitus with other circulatory complications: Secondary | ICD-10-CM

## 2021-03-09 LAB — CMP (CANCER CENTER ONLY)
ALT: 22 U/L (ref 0–44)
AST: 11 U/L — ABNORMAL LOW (ref 15–41)
Albumin: 3.9 g/dL (ref 3.5–5.0)
Alkaline Phosphatase: 64 U/L (ref 38–126)
Anion gap: 10 (ref 5–15)
BUN: 19 mg/dL (ref 6–20)
CO2: 22 mmol/L (ref 22–32)
Calcium: 9.5 mg/dL (ref 8.9–10.3)
Chloride: 101 mmol/L (ref 98–111)
Creatinine: 1.33 mg/dL — ABNORMAL HIGH (ref 0.61–1.24)
GFR, Estimated: >60 mL/min (ref >60)
Glucose, Bld: 530 mg/dL (ref 70–99)
Potassium: 4.2 mmol/L (ref 3.5–5.1)
Sodium: 133 mmol/L — ABNORMAL LOW (ref 135–145)
Total Bilirubin: 0.4 mg/dL (ref 0.3–1.2)
Total Protein: 7.5 g/dL (ref 6.5–8.1)

## 2021-03-09 LAB — CBC WITH DIFFERENTIAL (CANCER CENTER ONLY)
Abs Immature Granulocytes: 0.02 10*3/uL (ref 0.00–0.07)
Basophils Absolute: 0 10*3/uL (ref 0.0–0.1)
Basophils Relative: 1 %
Eosinophils Absolute: 0.1 10*3/uL (ref 0.0–0.5)
Eosinophils Relative: 2 %
HCT: 45.2 % (ref 39.0–52.0)
Hemoglobin: 15.3 g/dL (ref 13.0–17.0)
Immature Granulocytes: 0 %
Lymphocytes Relative: 43 %
Lymphs Abs: 2.3 10*3/uL (ref 0.7–4.0)
MCH: 28 pg (ref 26.0–34.0)
MCHC: 33.8 g/dL (ref 30.0–36.0)
MCV: 82.6 fL (ref 80.0–100.0)
Monocytes Absolute: 0.4 10*3/uL (ref 0.1–1.0)
Monocytes Relative: 7 %
Neutro Abs: 2.6 10*3/uL (ref 1.7–7.7)
Neutrophils Relative %: 47 %
Platelet Count: 341 10*3/uL (ref 150–400)
RBC: 5.47 MIL/uL (ref 4.22–5.81)
RDW: 12.1 % (ref 11.5–15.5)
WBC Count: 5.4 10*3/uL (ref 4.0–10.5)
nRBC: 0 % (ref 0.0–0.2)

## 2021-03-09 MED ORDER — GLUCOSE BLOOD VI STRP: ORAL_STRIP | 12 refills | Status: DC

## 2021-03-09 MED ORDER — PEN NEEDLES 32G X 4 MM MISC: 0 refills | Status: DC

## 2021-03-09 MED ORDER — ONETOUCH ULTRASOFT LANCETS MISC: 12 refills | Status: DC

## 2021-03-09 MED ORDER — INSULIN ASPART PROT & ASPART (70-30 MIX) 100 UNIT/ML ~~LOC~~ SUSP: SUBCUTANEOUS | 0 refills | Status: DC

## 2021-03-09 MED ORDER — ONETOUCH VERIO SYNC SYSTEM W/DEVICE KIT: PACK | 0 refills | Status: DC
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
## 2021-03-09 NOTE — Telephone Encounter (Signed)
Patient called to advise that he had an appointment with Dr Lorenso Courier today and his blood sugar was 530 in the office.  Patient called to advise of this and that he has been experiencing an increase in urine output since he started the Farxiga at his last visit with Dr Loanne Drilling. Patient does not test his blood sugars at home and "has not in years".  Wants to know what he should do and is requesting a call back to [phone]

## 2021-03-11 ENCOUNTER — Telehealth: Payer: Self-pay | Admitting: Hematology and Oncology

## 2021-03-11 NOTE — Telephone Encounter (Signed)
Scheduled per los. Called and left msg. Mailed printout  °

## 2021-03-14 ENCOUNTER — Other Ambulatory Visit: Payer: Self-pay | Admitting: Endocrinology

## 2021-03-14 DIAGNOSIS — E1159 Type 2 diabetes mellitus with other circulatory complications: Secondary | ICD-10-CM

## 2021-03-16 ENCOUNTER — Other Ambulatory Visit: Payer: Self-pay

## 2021-03-16 DIAGNOSIS — E1159 Type 2 diabetes mellitus with other circulatory complications: Secondary | ICD-10-CM

## 2021-03-16 MED ORDER — ONETOUCH DELICA LANCETS 33G MISC: 11 refills | Status: DC

## 2021-03-17 ENCOUNTER — Other Ambulatory Visit: Payer: Self-pay | Admitting: Endocrinology

## 2021-03-17 ENCOUNTER — Other Ambulatory Visit: Payer: Self-pay

## 2021-03-17 MED ORDER — NOVOLOG MIX 70/30 FLEXPEN (70-30) 100 UNIT/ML ~~LOC~~ SUPN: 10.0000 [IU] | PEN_INJECTOR | Freq: Two times a day (BID) | SUBCUTANEOUS | 3 refills | Status: DC

## 2021-04-15 ENCOUNTER — Telehealth: Payer: 59 | Admitting: Emergency Medicine

## 2021-04-15 DIAGNOSIS — R112 Nausea with vomiting, unspecified: Secondary | ICD-10-CM

## 2021-04-15 DIAGNOSIS — R197 Diarrhea, unspecified: Secondary | ICD-10-CM | POA: Diagnosis not present

## 2021-04-15 NOTE — Progress Notes (Signed)
Virtual Visit Consent   Bryan Mcmillan, you are scheduled for a virtual visit with a [facility] provider today.     Just as with appointments in the office, your consent must be obtained to participate.  Your consent will be active for this visit and any virtual visit you may have with one of our providers in the next 365 days.     If you have a MyChart account, a copy of this consent can be sent to you electronically.  All virtual visits are billed to your insurance company just like a traditional visit in the office.    As this is a virtual visit, video technology does not allow for your provider to perform a traditional examination.  This may limit your provider's ability to fully assess your condition.  If your provider identifies any concerns that need to be evaluated in person or the need to arrange testing (such as labs, EKG, etc.), we will make arrangements to do so.     Although advances in technology are sophisticated, we cannot ensure that it will always work on either your end or our end.  If the connection with a video visit is poor, the visit may have to be switched to a telephone visit.  With either a video or telephone visit, we are not always able to ensure that we have a secure connection.     I need to obtain your verbal consent now.   Are you willing to proceed with your visit today?    Bryan Mcmillan has provided verbal consent on 04/15/2021 for a virtual visit (video or telephone).   Bryan Getting, NP   Date: 04/15/2021 9:52 AM   Virtual Visit via Video Note   I, Bryan Mcmillan, connected with  Bryan Mcmillan  ([id number], 12/10/1970) on 04/15/21 at  8:30 AM EDT by a video-enabled telemedicine application and verified that I am speaking with the correct person using two identifiers.  Location: Patient: Virtual Visit Location Patient: Home Provider: Virtual Visit Location Provider: Home Office   I discussed the limitations of evaluation  and management by telemedicine and the availability of in person appointments. The patient expressed understanding and agreed to proceed.    History of Present Illness: Bryan Mcmillan is a 50 y.o. who identifies as a male who was assigned male at birth, and is being seen today for vomiting and diarrhea.  Patient had an acute GI illness with vomiting and diarrhea approximately 4 weeks ago.  He says he was sick for about a week, and then symptoms resolved, and he felt fully recovered.  Approximately 2 weeks ago, patient began having intermittent diarrhea associated with particular foods including high fat food, high sugar food, and dairy products.  Since 04/10/2021, patient has had daily vomiting and feels he is lost 7 pounds since that time.  Intermittent vomiting is also triggered by the above listed foods.  If he avoids these foods, patient can avoid the symptoms.  Patient has been using Zofran ODT, but finds it does not help, and can also trigger vomiting.  Patient is able to tolerate foods such as noodles and broth.  Despite report of weight loss, and frequent vomiting and diarrhea, patient does not feel he is dehydrated.  When asked if he is urinating per usual, patient reports his urine is still yellow, not dark, but feels he is urinating less than usual because he is losing significant liquid through diarrhea.  HPI: HPI  Problems:  Patient Active Problem List   Diagnosis Date Noted   DVT of leg (deep venous thrombosis) ([place]) 11/25/2020   Acute deep vein thrombosis (DVT) of left lower extremity ([place]) 11/21/2020   GAD (generalized anxiety disorder) 01/28/2020   Subcutaneous nodule 07/09/2015   Chest pain due to psychological stress 11/21/2013   Major depressive disorder, recurrent episode, mild ([place]) 09/24/2013   Speech abnormality 07/11/2013   Major depression, recurrent ([place]) 03/20/2013   Brain tumor, glioma ([place]) 11/08/2012   Hyperlipemia 08/25/2012   Migraine -followed by [facility] 08/25/2012   Uncontrolled type 2 diabetes mellitus ([place]) 08/25/2012   DVT (deep venous thrombosis), hx of recurrent 05/23/2012   Pulmonary embolism ([place]) 05/23/2012   Seizure disorder - followed by [facility] 05/23/2012    Allergies:  Allergies  Allergen Reactions   Norco [Hydrocodone-Acetaminophen] Other (See Comments)    Paralysis-everything but breathing   Penicillins Other (See Comments)    Did it involve swelling of the face/tongue/throat, SOB, or low BP? N/A Did it involve sudden or severe rash/hives, skin peeling, or any reaction on the inside of your mouth or nose? N/A Did you need to seek medical attention at a hospital or doctor's office? N/A When did it last happen? Child  If all above answers are "NO", may proceed with cephalosporin use.   Medications:  Current Outpatient Medications:    Blood Glucose Monitoring Suppl ([place]) w/Device KIT, Use as directed, Disp: 1 kit, Rfl: 0   buPROPion (WELLBUTRIN XL) 150 MG 24 hr tablet, Take 3 tablets (450 mg total) by mouth daily., Disp: 270 tablet, Rfl: 2   dapagliflozin propanediol (FARXIGA) 10 MG TABS tablet, Take 1 tablet (10 mg total) by mouth daily before breakfast., Disp: 90 tablet, Rfl: 3   enoxaparin (LOVENOX) 120 MG/0.8ML injection, Inject 0.8 mLs (120 mg total) into the skin every 12 (twelve) hours., Disp: 48 mL, Rfl: 5   glucose blood test strip, Use to check BS 2x a day, Disp: 100 each, Rfl: 12   hydrOXYzine (ATARAX/VISTARIL) 50 MG tablet, Take 50 mg by mouth 2 (two) times daily., Disp: , Rfl:    insulin aspart protamine - aspart (NOVOLOG MIX 70/30 FLEXPEN) (70-30) 100 UNIT/ML FlexPen, Inject 10 Units into the skin 2 (two) times daily with a meal., Disp: 30 mL, Rfl: 3   Insulin Pen Needle (PEN NEEDLES) 32G X 4 MM MISC, Use to inject insulin twice daily before breakfast and dinner, Disp: 100 each, Rfl: 0   lamoTRIgine (LAMICTAL) 200 MG tablet, Take 1 tablet (200 mg total) by mouth at bedtime.,  Disp: 90 tablet, Rfl: 0   Lancets (ONETOUCH ULTRASOFT) lancets, Use to check BS 2x a day, Disp: 100 each, Rfl: 12   metFORMIN (GLUCOPHAGE-XR) 500 MG 24 hr tablet, Take 1 tablet (500 mg total) by mouth daily., Disp: 90 tablet, Rfl: 3   OneTouch Delica Lancets 58K MISC, Check BS 1x a day, Disp: 100 each, Rfl: 11   prazosin (MINIPRESS) 5 MG capsule, Take 1 capsule (5 mg total) by mouth at bedtime., Disp: 90 capsule, Rfl: 0   repaglinide (PRANDIN) 2 MG tablet, Take 1 tablet (2 mg total) by mouth 3 (three) times daily before meals., Disp: 270 tablet, Rfl: 4   Semaglutide (RYBELSUS) 14 MG TABS, Take 14 mg by mouth daily., Disp: 90 tablet, Rfl: 3   traZODone (DESYREL) 50 MG tablet, TAKE 1 TABLET BY MOUTH AT BEDTIME AS NEEDED AND MAY REPEAT DOSE ONE TIME IF NEEDED FOR SLEEP. (Patient  taking differently: Take 50 mg by mouth at bedtime as needed for sleep (may repeat dose one time if needed).), Disp: 180 tablet, Rfl: 1  Observations/Objective: Patient is well-developed, well-nourished in no acute distress.  Resting comfortably  at home, lying in bed.  Head is normocephalic, atraumatic.  No labored breathing.  Speech is clear and coherent with logical content.  Patient is alert and oriented at baseline.    Assessment and Plan: 1. Nausea and vomiting, intractability of vomiting not specified, unspecified vomiting type  2. Diarrhea, unspecified type  I am concerned patient may be dehydrated, and told him so, I suggested since he is having such trouble keeping food and liquids down, plus the weight loss he reports, that he needs a higher level of care that I can provide over this video visit.  Patient encouraged to seek care in emergency room for IV fluid rehydration and IV nausea medicine since he feels Zofran ODT is not helping him.  Patient declines to seek care at the ED at this time.  With that in mind, I suggested patient avoid vomiting and diarrhea triggering foods, eat a bland diet and clear  liquids, try Imodium over-the-counter as directed on the package, continue to try the Zofran ODT he has, and to try adding probiotics when his symptoms are improving.  I suspect his high fat, high sugar, high dairy products diet is the cause of his symptoms.  However, I advised him that this ongoing problem may indicate a persistent GI infection, and he should follow-up with his primary care provider.  I advised him he should seek care in the emergency department if he is truly unable to keep liquids down and control his symptoms.  Follow Up Instructions: I discussed the assessment and treatment plan with the patient. The patient was provided an opportunity to ask questions and all were answered. The patient agreed with the plan and demonstrated an understanding of the instructions.  A copy of instructions were sent to the patient via MyChart.  The patient was advised to call back or seek an in-person evaluation if the symptoms worsen or if the condition fails to improve as anticipated.  Time:  I spent 20 minutes with the patient via telehealth technology discussing the above problems/concerns.    Bryan Getting, NP

## 2021-04-15 NOTE — Patient Instructions (Addendum)
Bryan Mcmillan, thank you for joining [name], NP for today's virtual visit.  While this provider is not your primary care provider (PCP), if your PCP is located in our provider database this encounter information will be shared with them immediately following your visit.  Consent: (Patient) Bryan Mcmillan provided verbal consent for this virtual visit at the beginning of the encounter.  Current Medications:  Current Outpatient Medications:    Blood Glucose Monitoring Suppl ([place]) w/Device KIT, Use as directed, Disp: 1 kit, Rfl: 0   buPROPion (WELLBUTRIN XL) 150 MG 24 hr tablet, Take 3 tablets (450 mg total) by mouth daily., Disp: 270 tablet, Rfl: 2   dapagliflozin propanediol (FARXIGA) 10 MG TABS tablet, Take 1 tablet (10 mg total) by mouth daily before breakfast., Disp: 90 tablet, Rfl: 3   enoxaparin (LOVENOX) 120 MG/0.8ML injection, Inject 0.8 mLs (120 mg total) into the skin every 12 (twelve) hours., Disp: 48 mL, Rfl: 5   glucose blood test strip, Use to check BS 2x a day, Disp: 100 each, Rfl: 12   hydrOXYzine (ATARAX/VISTARIL) 50 MG tablet, Take 50 mg by mouth 2 (two) times daily., Disp: , Rfl:    insulin aspart protamine - aspart (NOVOLOG MIX 70/30 FLEXPEN) (70-30) 100 UNIT/ML FlexPen, Inject 10 Units into the skin 2 (two) times daily with a meal., Disp: 30 mL, Rfl: 3   Insulin Pen Needle (PEN NEEDLES) 32G X 4 MM MISC, Use to inject insulin twice daily before breakfast and dinner, Disp: 100 each, Rfl: 0   lamoTRIgine (LAMICTAL) 200 MG tablet, Take 1 tablet (200 mg total) by mouth at bedtime., Disp: 90 tablet, Rfl: 0   Lancets (ONETOUCH ULTRASOFT) lancets, Use to check BS 2x a day, Disp: 100 each, Rfl: 12   metFORMIN (GLUCOPHAGE-XR) 500 MG 24 hr tablet, Take 1 tablet (500 mg total) by mouth daily., Disp: 90 tablet, Rfl: 3   OneTouch Delica Lancets 32I MISC, Check BS 1x a day, Disp: 100 each, Rfl: 11   prazosin (MINIPRESS) 5 MG capsule, Take  1 capsule (5 mg total) by mouth at bedtime., Disp: 90 capsule, Rfl: 0   repaglinide (PRANDIN) 2 MG tablet, Take 1 tablet (2 mg total) by mouth 3 (three) times daily before meals., Disp: 270 tablet, Rfl: 4   Semaglutide (RYBELSUS) 14 MG TABS, Take 14 mg by mouth daily., Disp: 90 tablet, Rfl: 3   traZODone (DESYREL) 50 MG tablet, TAKE 1 TABLET BY MOUTH AT BEDTIME AS NEEDED AND MAY REPEAT DOSE ONE TIME IF NEEDED FOR SLEEP. (Patient taking differently: Take 50 mg by mouth at bedtime as needed for sleep (may repeat dose one time if needed).), Disp: 180 tablet, Rfl: 1   Medications ordered in this encounter:  No orders of the defined types were placed in this encounter.    *If you need refills on other medications prior to your next appointment, please contact your pharmacy*  Follow-Up: Call back or seek an in-person evaluation if the symptoms worsen or if the condition fails to improve as anticipated.  Other Instructions Avoid foods that trigger your symptoms including those we discussed: high-fat foods, high sugar foods, and any dairy products.  Continue using Zofran ODT to try to manage nausea.  Keep your diet for the next couple of days restricted to bland foods such as plain crackers, plain toast, plain noodles, bananas, and applesauce and clear liquids such as broth and juice.  Try using Imodium and over-the-counter diarrhea medicine as directed  on the package to control your diarrhea.  If your symptoms are not improving and or you are worried you could be dehydrated, you should seek care in the nearest emergency department.  If you are not dehydrated, but your systems persist, see your primary care provider for an evaluation for a GI infection as we discussed.   If you have been instructed to have an in-person evaluation today at a local Urgent Care facility, please use the link below. It will take you to a list of all of our available [facility], including address, phone number and  hours of operation. Please do not delay care.  [facility]  If you or a family member do not have a primary care provider, use the link below to schedule a visit and establish care. When you choose a [facility] primary care physician or advanced practice provider, you gain a long-term partner in health. Find a Primary Care Provider  Learn more about [facility]'s in-office and virtual care options: [facility]

## 2021-04-29 ENCOUNTER — Ambulatory Visit (INDEPENDENT_AMBULATORY_CARE_PROVIDER_SITE_OTHER): Payer: 59 | Admitting: Endocrinology

## 2021-04-29 ENCOUNTER — Other Ambulatory Visit: Payer: Self-pay

## 2021-04-29 VITALS — BP 110/60 | HR 90 | Ht 71.0 in | Wt 235.2 lb

## 2021-04-29 DIAGNOSIS — R112 Nausea with vomiting, unspecified: Secondary | ICD-10-CM | POA: Diagnosis not present

## 2021-04-29 DIAGNOSIS — T383X5A Adverse effect of insulin and oral hypoglycemic [antidiabetic] drugs, initial encounter: Secondary | ICD-10-CM | POA: Diagnosis not present

## 2021-04-29 DIAGNOSIS — E119 Type 2 diabetes mellitus without complications: Secondary | ICD-10-CM

## 2021-04-29 DIAGNOSIS — E1159 Type 2 diabetes mellitus with other circulatory complications: Secondary | ICD-10-CM

## 2021-04-29 LAB — POCT GLYCOSYLATED HEMOGLOBIN (HGB A1C): Hemoglobin A1C: 7.5 % — AB (ref 4.0–5.6)

## 2021-04-29 MED ORDER — RYBELSUS 7 MG PO TABS: 7.0000 mg | ORAL_TABLET | Freq: Every day | ORAL | 3 refills | Status: DC

## 2021-04-29 NOTE — Progress Notes (Signed)
Subjective:    Patient ID: Bryan Mcmillan, male    DOB: Dec 29, 1970, 50 y.o.   MRN: [id number]  HPI Pt returns for f/u of DM:  DM type: 2 Dx'ed: 3833 Complications: none Therapy: 4 oral meds DKA: never Severe hypoglycemia: never.   Pancreatitis: never.   SDOH: he does not check cbg's Other: he took insulin 2012-2015; he did not tolerate pioglitazone (edema); fructosamine has confirmed A1c; He does not want to add another injection to Lovenox.    Interval history: pt states n/v since last ov.  He says this has caused him to not keep DM meds down.  He did not start taking the insulin.   Past Medical History:  Diagnosis Date   Anxiety    Asthma    Depression    Diabetes ([place])    DVT (deep venous thrombosis), hx of recurrent 05/23/2012   Headache(784.0)    frequent   History of blood clot in brain, 2012 - followed by [facility] 05/23/2012   History of blood clots    History of suicidal tendencies    Hyperlipemia 08/25/2012   Hyperlipidemia    Kidney disease    Kidney stones    Migraines    Obesity    Pneumonia    Seizure ([place])    Seizure disorder - followed by [facility] 05/23/2012   Sleep apnea    Tuberculosis     Past Surgical History:  Procedure Laterality Date   BRAIN SURGERY     filter for blood clots     MECHANICAL THROMBECTOMY WITH AORTOGRAM AND INTERVENTION Left 11/28/2020   Procedure: MECHANICAL THROMBECTOMY OF LEFT COMMON ILIAC VEIN TO POPLITEAL VEIN;  Surgeon: Serafina Mitchell, MD;  Location: [place];  Service: Vascular;  Laterality: Left;   PERIPHERAL VASCULAR THROMBECTOMY N/A 11/25/2020   Procedure: PERIPHERAL VASCULAR THROMBECTOMY;  Surgeon: Serafina Mitchell, MD;  Location: [place] [facility];  Service: Cardiovascular;  Laterality: N/A;   TOE AMPUTATION Right 2012   ULTRASOUND GUIDANCE FOR VASCULAR ACCESS Left 11/28/2020   Procedure: ULTRASOUND GUIDANCE FOR VASCULAR ACCESS OF LEFT POPLITEAL VEIN;  Surgeon: Serafina Mitchell, MD;  [facility];  Service: Vascular;  Laterality: Left;   VENOGRAM Left 11/28/2020   Procedure: VENOGRAM;  Surgeon: Serafina Mitchell, MD;  [facility];  Service: Vascular;  Laterality: Left;    Social History   Socioeconomic History   Marital status: Divorced    Spouse name: Not on file   Number of children: 1   Years of education: BA   Highest education level: Not on file  Occupational History   Occupation: CUST SERV    Employer: [employer]  Tobacco Use   Smoking status: Former   Smokeless tobacco: Never  Scientific laboratory technician Use: Never used  Substance and Sexual Activity   Alcohol use: No   Drug use: No   Sexual activity: Never  Other Topics Concern   Not on file  Social History Narrative   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[employer], 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      Spiritual Beliefs:      Lifestyle: trying to walk and working on diet      Caffeine Use: does consume         Social Determinants of Radio broadcast assistant Strain: Not on file  Food Insecurity: No Food Insecurity   Worried About Running Out of  Food in the Last Year: Never true   Ran Out of Food in the Last Year: Never true  Transportation Needs: Not on file  Physical Activity: Not on file  Stress: Not on file  Social Connections: Not on file  Intimate Partner Violence: Not on file    Current Outpatient Medications on File Prior to Visit  Medication Sig Dispense Refill   Blood Glucose Monitoring Suppl ([place]) w/Device KIT Use as directed 1 kit 0   buPROPion (WELLBUTRIN XL) 150 MG 24 hr tablet Take 3 tablets (450 mg total) by mouth daily. 270 tablet 2   dapagliflozin propanediol (FARXIGA) 10 MG TABS tablet Take 1 tablet (10 mg total) by mouth daily before breakfast. 90 tablet 3   enoxaparin (LOVENOX) 120 MG/0.8ML injection Inject 0.8 mLs (120 mg total) into the skin every 12 (twelve) hours. 48 mL 5   glucose blood test strip Use to check BS 2x a  day 100 each 12   hydrOXYzine (ATARAX/VISTARIL) 50 MG tablet Take 50 mg by mouth 2 (two) times daily.     Insulin Pen Needle (PEN NEEDLES) 32G X 4 MM MISC Use to inject insulin twice daily before breakfast and dinner 100 each 0   lamoTRIgine (LAMICTAL) 200 MG tablet Take 1 tablet (200 mg total) by mouth at bedtime. 90 tablet 0   Lancets (ONETOUCH ULTRASOFT) lancets Use to check BS 2x a day 100 each 12   metFORMIN (GLUCOPHAGE-XR) 500 MG 24 hr tablet Take 1 tablet (500 mg total) by mouth daily. 90 tablet 3   OneTouch Delica Lancets 62B MISC Check BS 1x a day 100 each 11   prazosin (MINIPRESS) 5 MG capsule Take 1 capsule (5 mg total) by mouth at bedtime. 90 capsule 0   repaglinide (PRANDIN) 2 MG tablet Take 1 tablet (2 mg total) by mouth 3 (three) times daily before meals. 270 tablet 4   traZODone (DESYREL) 50 MG tablet TAKE 1 TABLET BY MOUTH AT BEDTIME AS NEEDED AND MAY REPEAT DOSE ONE TIME IF NEEDED FOR SLEEP. (Patient taking differently: Take 50 mg by mouth at bedtime as needed for sleep (may repeat dose one time if needed).) 180 tablet 1   No current facility-administered medications on file prior to visit.    Allergies  Allergen Reactions   Norco [Hydrocodone-Acetaminophen] Other (See Comments)    Paralysis-everything but breathing   Penicillins Other (See Comments)    Did it involve swelling of the face/tongue/throat, SOB, or low BP? N/A Did it involve sudden or severe rash/hives, skin peeling, or any reaction on the inside of your mouth or nose? N/A Did you need to seek medical attention at a hospital or doctor's office? N/A When did it last happen? Child  If all above answers are "NO", may proceed with cephalosporin use.    Family History  Problem Relation Age of Onset   Hyperlipidemia Mother    Depression Mother    Heart disease Father    Stroke Other        parent   Diabetes Other        grandparent /parent   Obesity Other    Heart attack Other     BP 110/60 (BP  Location: Right Arm, Patient Position: Sitting, Cuff Size: Large)   Pulse 90   Ht _0  (1.803 m)   Wt 235 lb 3.2 oz (106.7 kg)   SpO2 97%   BMI 32.80 kg/m    Review of Systems He has lost 22  lbs since last ov    Objective:   Physical Exam Pulses: dorsalis pedis intact bilat.   MSK: no deformity of the feet, except right toes 1-3 toes are absent.  CV: no leg edema Skin:  no ulcer on the feet.  normal color and temp on the feet.  Neuro: sensation is intact to touch on the feet, but decreased from normal.    A1c=7.5%  Lab Results  Component Value Date   CREATININE 1.33 (H) 03/09/2021   BUN 19 03/09/2021   NA 133 (L) 03/09/2021   K 4.2 03/09/2021   CL 101 03/09/2021   CO2 22 03/09/2021   Lab Results  Component Value Date   TSH 1.31 10/15/2020      Assessment & Plan:  Type 2 [id number] glycemic control. N/v, due to Rybelsus  Patient Instructions  I have sent 2 prescriptions to your pharmacy, to reduce the Rybelsus.   Please continue the same other medications.   You can stay off the insulin. check your blood sugar once a day.  vary the time of day when you check, between before the 3 meals, and at bedtime.  also check if you have symptoms of your blood sugar being too high or too low.  please keep a record of the readings and bring it to your next appointment here (or you can bring the meter itself).  You can write it on any piece of paper.  please call us sooner if your blood sugar goes below 70, or if you have a lot of readings over 200. Please come back for a follow-up appointment in 2 months.

## 2021-04-29 NOTE — Patient Instructions (Addendum)
I have sent 2 prescriptions to your pharmacy, to reduce the Rybelsus.   Please continue the same other medications.   You can stay off the insulin. check your blood sugar once a day.  vary the time of day when you check, between before the 3 meals, and at bedtime.  also check if you have symptoms of your blood sugar being too high or too low.  please keep a record of the readings and bring it to your next appointment here (or you can bring the meter itself).  You can write it on any piece of paper.  please call us sooner if your blood sugar goes below 70, or if you have a lot of readings over 200. Please come back for a follow-up appointment in 2 months.

## 2021-05-01 DIAGNOSIS — E119 Type 2 diabetes mellitus without complications: Secondary | ICD-10-CM | POA: Insufficient documentation

## 2021-05-04 ENCOUNTER — Inpatient Hospital Stay: Payer: 59 | Attending: Hematology and Oncology | Admitting: Hematology and Oncology

## 2021-05-04 ENCOUNTER — Other Ambulatory Visit: Payer: 59

## 2021-05-04 ENCOUNTER — Inpatient Hospital Stay: Payer: 59

## 2021-05-04 ENCOUNTER — Other Ambulatory Visit: Payer: Self-pay | Admitting: Hematology and Oncology

## 2021-05-04 ENCOUNTER — Other Ambulatory Visit: Payer: Self-pay

## 2021-05-04 VITALS — BP 106/72 | HR 77 | Temp 98.4°F | Resp 17 | Ht 71.0 in | Wt 235.3 lb

## 2021-05-04 DIAGNOSIS — Z79899 Other long term (current) drug therapy: Secondary | ICD-10-CM | POA: Diagnosis not present

## 2021-05-04 DIAGNOSIS — D689 Coagulation defect, unspecified: Secondary | ICD-10-CM

## 2021-05-04 DIAGNOSIS — G4730 Sleep apnea, unspecified: Secondary | ICD-10-CM | POA: Insufficient documentation

## 2021-05-04 DIAGNOSIS — Z794 Long term (current) use of insulin: Secondary | ICD-10-CM | POA: Diagnosis not present

## 2021-05-04 DIAGNOSIS — E1165 Type 2 diabetes mellitus with hyperglycemia: Secondary | ICD-10-CM | POA: Diagnosis not present

## 2021-05-04 DIAGNOSIS — G40909 Epilepsy, unspecified, not intractable, without status epilepticus: Secondary | ICD-10-CM | POA: Insufficient documentation

## 2021-05-04 DIAGNOSIS — Z86718 Personal history of other venous thrombosis and embolism: Secondary | ICD-10-CM | POA: Diagnosis not present

## 2021-05-04 DIAGNOSIS — E785 Hyperlipidemia, unspecified: Secondary | ICD-10-CM | POA: Diagnosis not present

## 2021-05-04 DIAGNOSIS — I82402 Acute embolism and thrombosis of unspecified deep veins of left lower extremity: Secondary | ICD-10-CM

## 2021-05-04 DIAGNOSIS — Z7901 Long term (current) use of anticoagulants: Secondary | ICD-10-CM | POA: Insufficient documentation

## 2021-05-04 LAB — CMP (CANCER CENTER ONLY)
ALT: 20 U/L (ref 0–44)
AST: 14 U/L — ABNORMAL LOW (ref 15–41)
Albumin: 4.1 g/dL (ref 3.5–5.0)
Alkaline Phosphatase: 52 U/L (ref 38–126)
Anion gap: 8 (ref 5–15)
BUN: 20 mg/dL (ref 6–20)
CO2: 21 mmol/L — ABNORMAL LOW (ref 22–32)
Calcium: 9.4 mg/dL (ref 8.9–10.3)
Chloride: 112 mmol/L — ABNORMAL HIGH (ref 98–111)
Creatinine: 1.07 mg/dL (ref 0.61–1.24)
GFR, Estimated: >60 mL/min (ref >60)
Glucose, Bld: 61 mg/dL — ABNORMAL LOW (ref 70–99)
Potassium: 3.8 mmol/L (ref 3.5–5.1)
Sodium: 141 mmol/L (ref 135–145)
Total Bilirubin: 0.4 mg/dL (ref 0.3–1.2)
Total Protein: 7.1 g/dL (ref 6.5–8.1)

## 2021-05-04 LAB — CBC WITH DIFFERENTIAL (CANCER CENTER ONLY)
Abs Immature Granulocytes: 0.03 10*3/uL (ref 0.00–0.07)
Basophils Absolute: 0 10*3/uL (ref 0.0–0.1)
Basophils Relative: 0 %
Eosinophils Absolute: 0.1 10*3/uL (ref 0.0–0.5)
Eosinophils Relative: 1 %
HCT: 41.5 % (ref 39.0–52.0)
Hemoglobin: 14.4 g/dL (ref 13.0–17.0)
Immature Granulocytes: 0 %
Lymphocytes Relative: 37 %
Lymphs Abs: 2.7 10*3/uL (ref 0.7–4.0)
MCH: 28.9 pg (ref 26.0–34.0)
MCHC: 34.7 g/dL (ref 30.0–36.0)
MCV: 83.2 fL (ref 80.0–100.0)
Monocytes Absolute: 0.5 10*3/uL (ref 0.1–1.0)
Monocytes Relative: 7 %
Neutro Abs: 4 10*3/uL (ref 1.7–7.7)
Neutrophils Relative %: 55 %
Platelet Count: 338 10*3/uL (ref 150–400)
RBC: 4.99 MIL/uL (ref 4.22–5.81)
RDW: 13.2 % (ref 11.5–15.5)
WBC Count: 7.3 10*3/uL (ref 4.0–10.5)
nRBC: 0 % (ref 0.0–0.2)
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
## 2021-05-11 ENCOUNTER — Telehealth (HOSPITAL_COMMUNITY): Payer: Self-pay

## 2021-05-11 NOTE — Telephone Encounter (Signed)
FORMER PATIENT OF DR. PUCILOWSKI'S. RECEIVED A FAX REQUESTING A REFILL ON PATIENT'S LAMOTRIGINE 200MG  & PRAZOSIN 5MG . WRITER TRIED TO REACH PATIENT SEVERAL TIMES TO SEE IF HE NEEDED A LETTER, BRIDGE SUPPLIES ON THE MEDICATIONS, AND IF HE HAS FOUND A NEW PROVIDER. WRITER HAS BEEN UNABLE TO REACH PATIENT FOR CLARIFICATION

## 2021-06-04 ENCOUNTER — Other Ambulatory Visit: Payer: Self-pay | Admitting: Endocrinology

## 2021-06-04 DIAGNOSIS — E1159 Type 2 diabetes mellitus with other circulatory complications: Secondary | ICD-10-CM

## 2021-06-16 ENCOUNTER — Encounter: Payer: Self-pay | Admitting: Hematology and Oncology

## 2021-06-22 IMAGING — CT CT ANGIO CHEST
2 of 6 series · 17 of 46 positions shown · IV contrast (APPLIED)
Comparison: Chest radiography 11/21/2020.  CT 04/07/2013.

CLINICAL DATA: Chest pain and shortness of breath. History of deep
venous thrombosis.

EXAM:
CT ANGIOGRAPHY CHEST WITH CONTRAST
TECHNIQUE: Multidetector CT imaging of the chest was performed using the
standard protocol during bolus administration of intravenous
contrast. Multiplanar CT image reconstructions and MIPs were
obtained to evaluate the vascular anatomy.
CONTRAST:  100mL OMNIPAQUE IOHEXOL 350 MG/ML SOLN

[Series 7: thins · axial · 0.65mm/px · z∈[+1112,+1317]mm · 14 of 225 slices shown]
[im 10/225  lung]
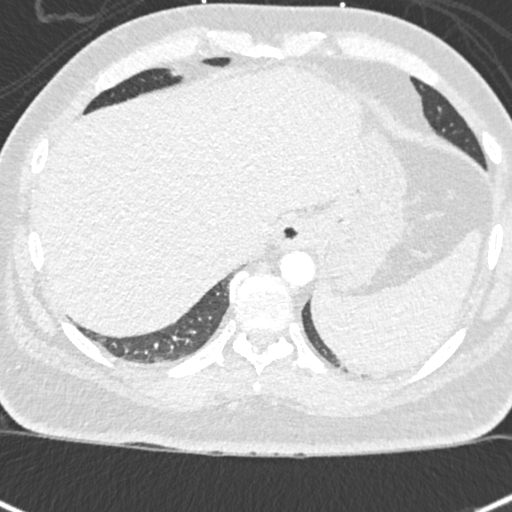
[im 30/225  soft-tissue]
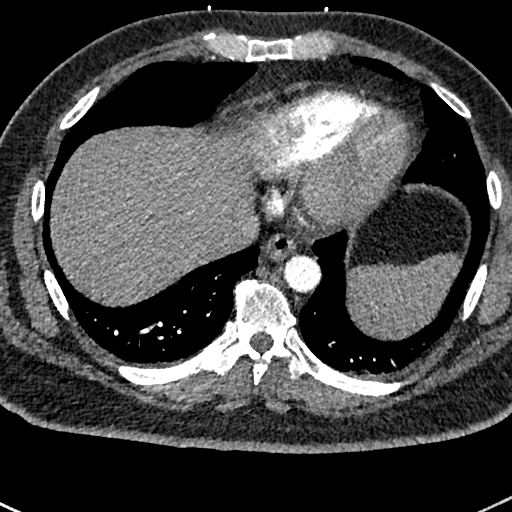
[im 39/225  lung]
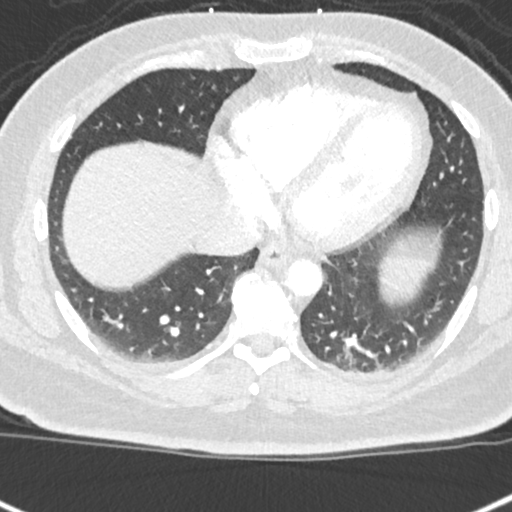
[im 59/225  soft-tissue]
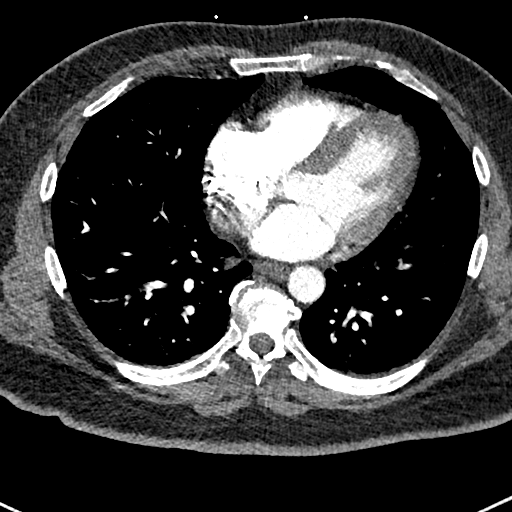
[im 78/225  lung]
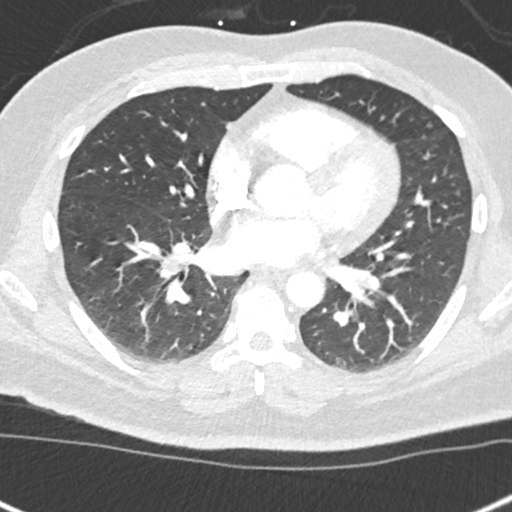
[im 88/225  soft-tissue]
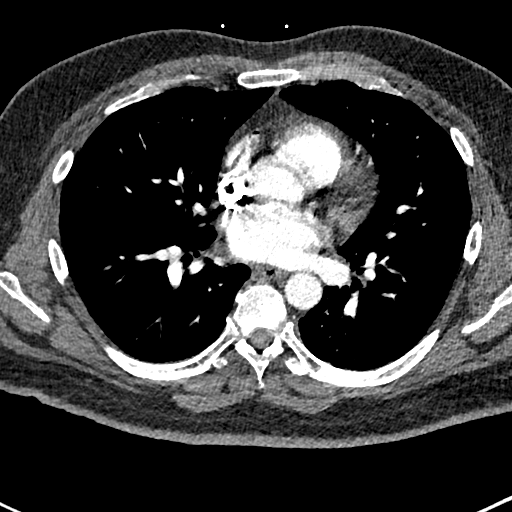
[im 108/225  lung]
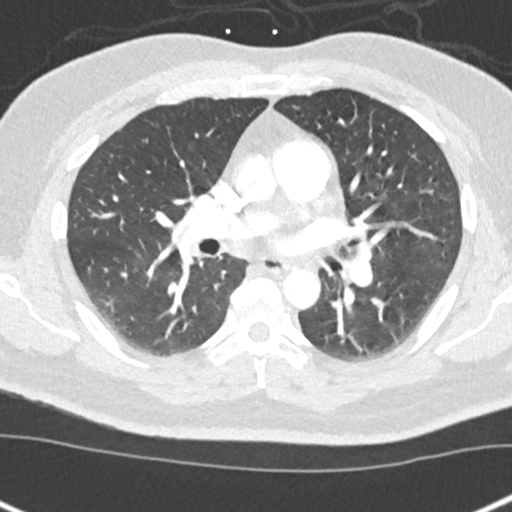
[im 117/225  soft-tissue]
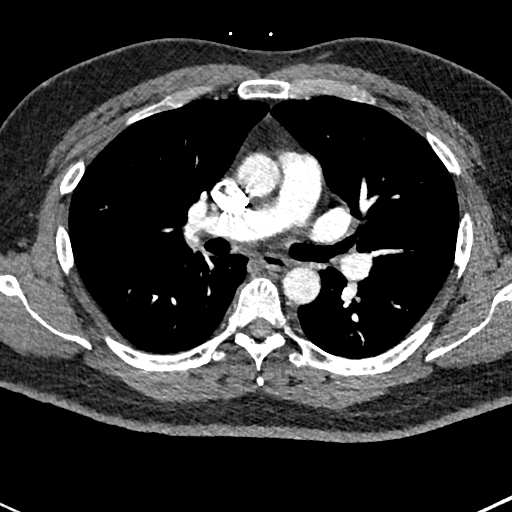
[im 137/225  lung]
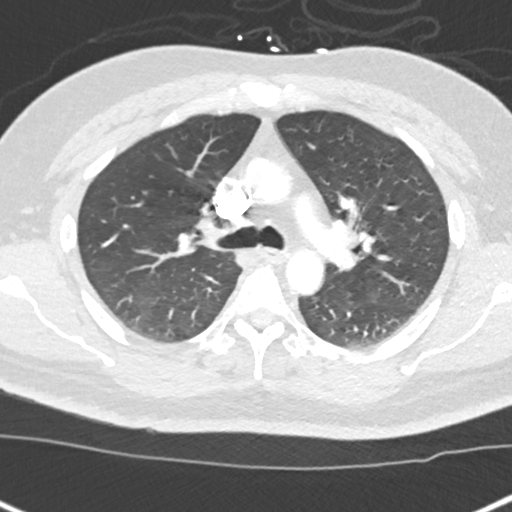
[im 147/225  soft-tissue]
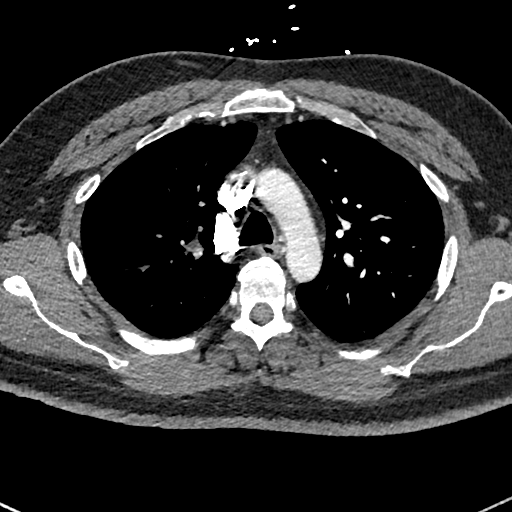
[im 166/225  lung]
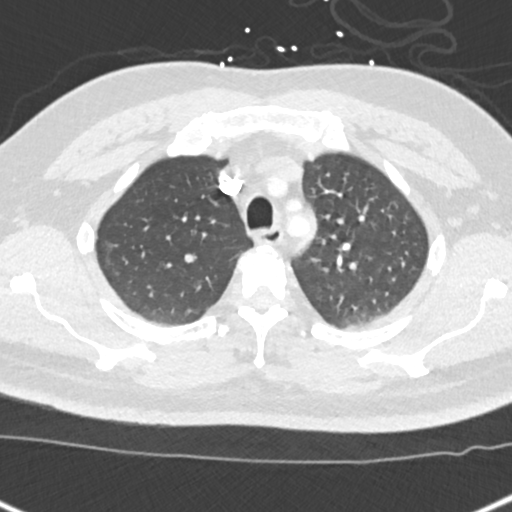
[im 186/225  soft-tissue]
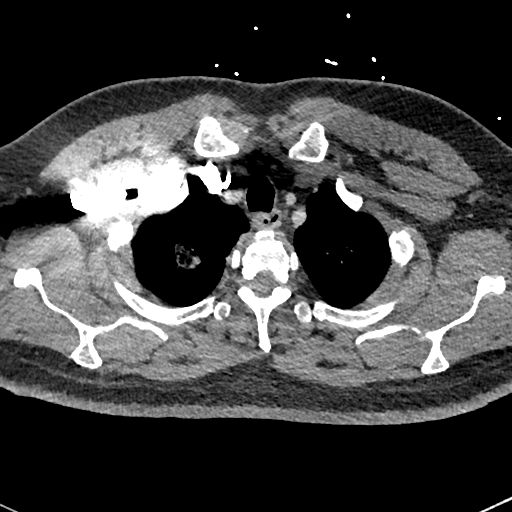
[im 195/225  lung]
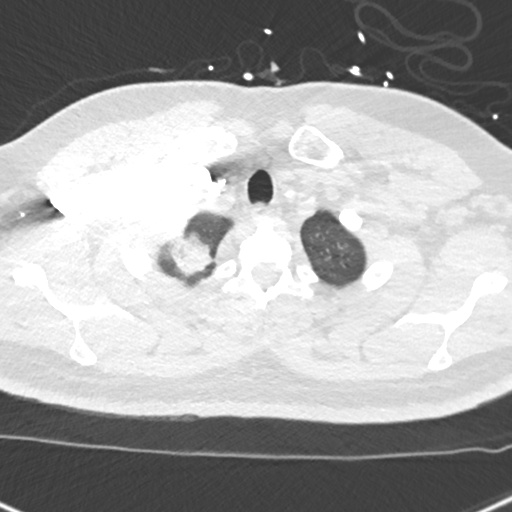
[im 215/225  soft-tissue]
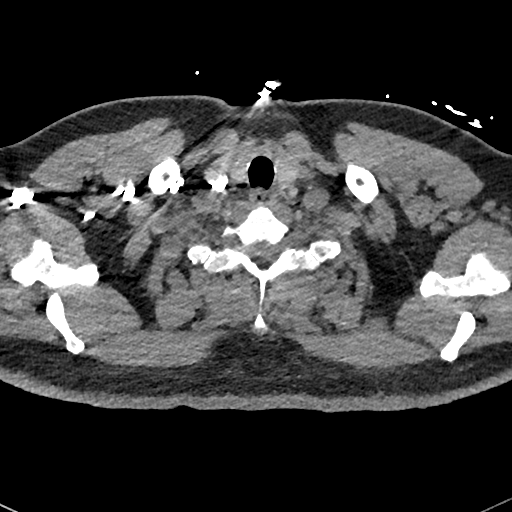

[Series 8: coronal mpr · coronal · 0.45mm/px · 3 of 152 slices shown]
[im 38/152  soft-tissue]
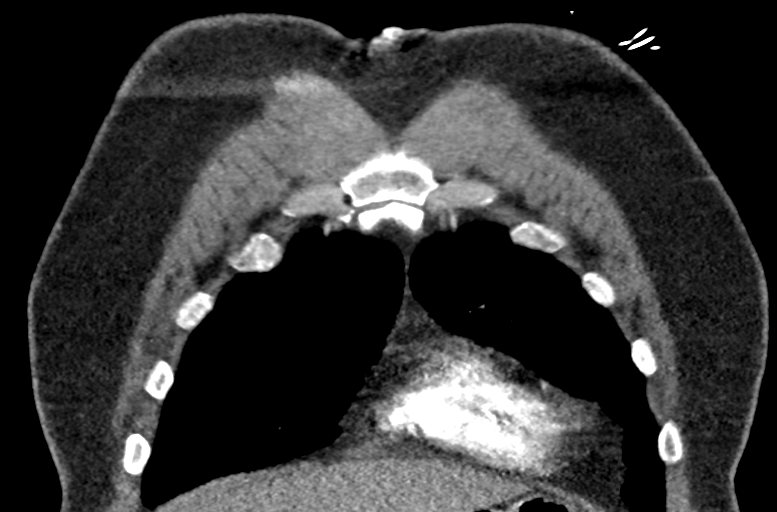
[im 76/152  soft-tissue]
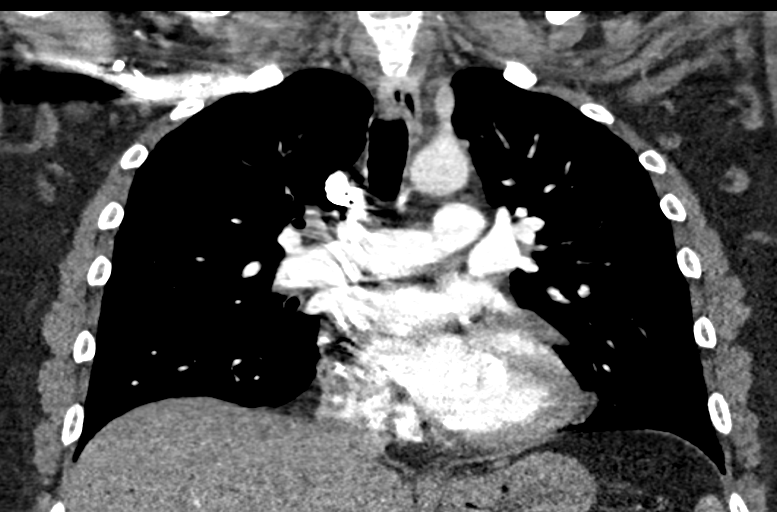
[im 114/152  soft-tissue]
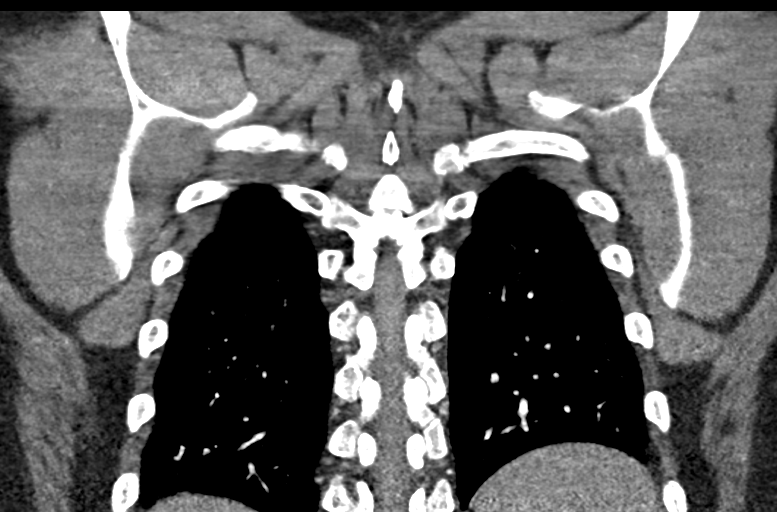

[17 of 46 positions shown; findings below may reference images not displayed]

FINDINGS: Cardiovascular: Heart size is normal. No pericardial fluid. No
coronary artery calcification. No aortic atherosclerotic
calcification. Pulmonary arterial opacification is good. Pulmonary
embolus is visible in the right pulmonary arterial tree within the
lower lobe branches. No sign of right heart strain.

Mediastinum/Nodes: 1.9 cm right hilar lymph node.

Lungs/Pleura: 2.7 cm in diameter focal density at the right lung
apex. Whereas this could represent infectious infiltrate, this is
worrisome for potential mass lesion. Second 5 mm nodule just medial
to that. Third 6 mm nodule inferior to that axial image 28. Left
lung shows mild volume loss at the posterior base but is otherwise
clear. No pleural effusion.

Upper Abdomen: Normal

Musculoskeletal: Normal

Review of the MIP images confirms the above findings.
IMPRESSION: 1. Study positive for pulmonary emboli in the right lower lobe
branches no massive or submassive emboli. No right heart strain.
2. 2.7 cm focal density at the right lung apex. Whereas this could
represent infectious infiltrate, this is worrisome for potential
mass lesion. Second 5 mm nodule just medial to that. 6 mm nodule
inferior to that. 1.9 cm right hilar lymph node. Consider one of the
following in 3 months for both low-risk and high-risk individuals:
(a) repeat chest CT, (b) follow-up PET-CT, or (c) tissue sampling.
This recommendation follows the consensus statement: Guidelines for
Management of Incidental Pulmonary Nodules Detected on CT Images:

## 2021-06-30 ENCOUNTER — Telehealth: Payer: Self-pay | Admitting: Pulmonary Disease

## 2021-06-30 ENCOUNTER — Ambulatory Visit: Payer: 59 | Admitting: Endocrinology

## 2021-06-30 NOTE — Telephone Encounter (Signed)
Spoke to patient, who stated that his cpap machine is now in spanish and the settings are changed.  He has not contact [employer]. I recommended that he contact [employer]. Patient has been provided with contact number.  Nothing further needed at this time.

## 2021-07-10 ENCOUNTER — Ambulatory Visit (HOSPITAL_COMMUNITY)
Admission: RE | Admit: 2021-07-10 | Discharge: 2021-07-10 | Disposition: A | Discharge status: Home / Self Care | Payer: 59 | Source: Ambulatory Visit | Attending: Surgery | Admitting: Surgery

## 2021-07-10 ENCOUNTER — Other Ambulatory Visit: Payer: Self-pay

## 2021-07-10 DIAGNOSIS — I825Y3 Chronic embolism and thrombosis of unspecified deep veins of proximal lower extremity, bilateral: Secondary | ICD-10-CM | POA: Diagnosis present

## 2021-08-10 ENCOUNTER — Other Ambulatory Visit: Payer: Self-pay

## 2021-08-10 ENCOUNTER — Ambulatory Visit (INDEPENDENT_AMBULATORY_CARE_PROVIDER_SITE_OTHER): Payer: 59 | Admitting: Physician Assistant

## 2021-08-10 ENCOUNTER — Ambulatory Visit (HOSPITAL_COMMUNITY)
Admission: RE | Admit: 2021-08-10 | Discharge: 2021-08-10 | Disposition: A | Discharge status: Home / Self Care | Payer: 59 | Source: Ambulatory Visit | Attending: Physician Assistant | Admitting: Physician Assistant

## 2021-08-10 VITALS — BP 110/73 | HR 84 | Temp 98.2°F | Resp 20 | Ht 71.0 in | Wt 252.7 lb

## 2021-08-10 DIAGNOSIS — I825Y3 Chronic embolism and thrombosis of unspecified deep veins of proximal lower extremity, bilateral: Secondary | ICD-10-CM | POA: Diagnosis not present

## 2021-08-10 NOTE — Progress Notes (Signed)
Office Note     CC:  follow up Requesting Provider:  Orson Slick, MD  HPI: Bryan Mcmillan is a 51 y.o. (1970-08-10) male who presents for follow up of DVT. Patient has history of multiple DVTs and PE while on anticoagulation ( Coumadin and Xarelto). He had prior interventions in [place] and [place]. Has had prior Filters which have since been removed. He presented most recently to [facility] ED on 11/21/20 with painful left swollen lower extremity. On 11/25/2020 he underwent an attempt at thrombolysis which was unsuccessful secondary to the inability to cannulate his vein.  He was brought back several days later and then underwent successful mechanical thrombectomy of the left popliteal, femoral, common femoral, external iliac, and common iliac vein as well as subsequent balloon venoplasty.  He is followed by Dr. Lorenso Courier, his hematologist.  He remains on Lovenox. The etiology of his hypercoagulable state is unknown. Lovenox has been recommended indefinitely.  Today he reports continued numbness in his left great toe and second toe. This is unchanged from his prior visit in June. He otherwise does not have any numbness or pain in his left leg. The swelling is essentially resolved. He does wear compression stockings and elevates his legs regularly. 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 He denies any heaviness, throbbing, weakness, aching, tightness, cramping or any other discomfort. His previous duplex showed his iliac and vena cava was widely patent with some chronic thrombus in the common femoral vein to the popliteal vein  Past Medical History:  Diagnosis Date   Anxiety    Asthma    Depression    Diabetes ([place])    DVT (deep venous thrombosis), hx of recurrent 05/23/2012   Headache(784.0)    frequent   History of blood clot in brain, 2012 - followed by [facility] 05/23/2012   History of blood clots    History of suicidal tendencies    Hyperlipemia 08/25/2012    Hyperlipidemia    Kidney disease    Kidney stones    Migraines    Obesity    Pneumonia    Seizure ([place])    Seizure disorder - followed by [facility] 05/23/2012   Sleep apnea    Tuberculosis     Past Surgical History:  Procedure Laterality Date   BRAIN SURGERY     filter for blood clots     MECHANICAL THROMBECTOMY WITH AORTOGRAM AND INTERVENTION Left 11/28/2020   Procedure: MECHANICAL THROMBECTOMY OF LEFT COMMON ILIAC VEIN TO POPLITEAL VEIN;  Surgeon: Serafina Mitchell, MD;  Location: [place];  Service: Vascular;  Laterality: Left;   PERIPHERAL VASCULAR THROMBECTOMY N/A 11/25/2020   Procedure: PERIPHERAL VASCULAR THROMBECTOMY;  Surgeon: Serafina Mitchell, MD;  Location: [place] [facility];  Service: Cardiovascular;  Laterality: N/A;   TOE AMPUTATION Right 2012   ULTRASOUND GUIDANCE FOR VASCULAR ACCESS Left 11/28/2020   Procedure: ULTRASOUND GUIDANCE FOR VASCULAR ACCESS OF LEFT POPLITEAL VEIN;  Surgeon: Serafina Mitchell, MD;  Location: [facility];  Service: Vascular;  Laterality: Left;   VENOGRAM Left 11/28/2020   Procedure: VENOGRAM;  Surgeon: Serafina Mitchell, MD;  Location: [facility];  Service: Vascular;  Laterality: Left;    Social History   Socioeconomic History   Marital status: Divorced    Spouse name: Not on file   Number of children: 1   Years of education: BA   Highest education level: Not on file  Occupational History   Occupation: CUST SERV  Employer: [employer]  Tobacco Use   Smoking status: Former   Smokeless tobacco: Never  Vaping Use   Vaping Use: Never used  Substance and Sexual Activity   Alcohol use: No   Drug use: No   Sexual activity: Never  Other Topics Concern   Not on file  Social History Narrative   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[employer], 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      Spiritual Beliefs:      Lifestyle: trying to walk and working on diet      Caffeine Use: does consume         Social Determinants of  Radio broadcast assistant Strain: Not on file  Food Insecurity: Not on file  Transportation Needs: Not on file  Physical Activity: Not on file  Stress: Not on file  Social Connections: Not on file  Intimate Partner Violence: Not on file    Family History  Problem Relation Age of Onset   Hyperlipidemia Mother    Depression Mother    Heart disease Father    Stroke Other        parent   Diabetes Other        grandparent /parent   Obesity Other    Heart attack Other     Current Outpatient Medications  Medication Sig Dispense Refill   Blood Glucose Monitoring Suppl ([place]) w/Device KIT Use as directed 1 kit 0   buPROPion (WELLBUTRIN XL) 150 MG 24 hr tablet Take 3 tablets (450 mg total) by mouth daily. 270 tablet 2   dapagliflozin propanediol (FARXIGA) 10 MG TABS tablet Take 1 tablet (10 mg total) by mouth daily before breakfast. 90 tablet 3   enoxaparin (LOVENOX) 120 MG/0.8ML injection Inject 0.8 mLs (120 mg total) into the skin every 12 (twelve) hours. 48 mL 5   hydrOXYzine (ATARAX/VISTARIL) 50 MG tablet Take 50 mg by mouth 2 (two) times daily.     lamoTRIgine (LAMICTAL) 200 MG tablet Take 1 tablet (200 mg total) by mouth at bedtime. 90 tablet 0   metFORMIN (GLUCOPHAGE-XR) 500 MG 24 hr tablet Take 1 tablet (500 mg total) by mouth daily. 90 tablet 3   prazosin (MINIPRESS) 5 MG capsule Take 1 capsule (5 mg total) by mouth at bedtime. 90 capsule 0   repaglinide (PRANDIN) 2 MG tablet Take 1 tablet (2 mg total) by mouth 3 (three) times daily before meals. 270 tablet 4   Semaglutide (RYBELSUS) 7 MG TABS Take 7 mg by mouth daily. 90 tablet 3   traZODone (DESYREL) 50 MG tablet TAKE 1 TABLET BY MOUTH AT BEDTIME AS NEEDED AND MAY REPEAT DOSE ONE TIME IF NEEDED FOR SLEEP. (Patient taking differently: Take 50 mg by mouth at bedtime as needed for sleep (may repeat dose one time if needed).) 180 tablet 1   No current facility-administered medications for this visit.     Allergies  Allergen Reactions   Norco [Hydrocodone-Acetaminophen] Other (See Comments)    Paralysis-everything but breathing   Penicillins Other (See Comments)    Did it involve swelling of the face/tongue/throat, SOB, or low BP? N/A Did it involve sudden or severe rash/hives, skin peeling, or any reaction on the inside of your mouth or nose? N/A Did you need to seek medical attention at a hospital or doctor's office? N/A When did it last happen? Child  If all above answers are NO, may proceed with cephalosporin use.  REVIEW OF SYSTEMS:  [X]  denotes positive finding, [ ]  denotes negative finding Cardiac  Comments:  Chest pain or chest pressure:    Shortness of breath upon exertion:    Short of breath when lying flat:    Irregular heart rhythm:        Vascular    Pain in calf, thigh, or hip brought on by ambulation:    Pain in feet at night that wakes you up from your sleep:     Blood clot in your veins: X History of multiple DVTs  Leg swelling:         Pulmonary    Oxygen at home:    Productive cough:     Wheezing:         Neurologic    Sudden weakness in arms or legs:     Sudden numbness in arms or legs:     Sudden onset of difficulty speaking or slurred speech:    Temporary loss of vision in one eye:     Problems with dizziness:         Gastrointestinal    Blood in stool:     Vomited blood:         Genitourinary    Burning when urinating:     Blood in urine:        Psychiatric    Major depression:         Hematologic    Bleeding problems:    Problems with blood clotting too easily:        Skin    Rashes or ulcers:        Constitutional    Fever or chills:      PHYSICAL EXAMINATION:  Vitals:   08/10/21 1455  BP: 110/73  Pulse: 84  Resp: 20  Temp: 98.2 F (36.8 C)  TempSrc: Temporal  SpO2: 96%  Weight: 252 lb 11.2 oz (114.6 kg)  Height: 5' 11"  (1.803 m)    General:  WDWN in NAD; vital signs documented above Gait: Normal HENT:  WNL, normocephalic Pulmonary: normal non-labored breathing  Cardiac: regular HR, without  Murmurs  Vascular Exam/Pulses:2+ radial, 2+ Dp and PT pulses bilaterally. Lower extremities well perfused and warm. No erythema. No edema. Left calf slightly larger than right. Musculoskeletal: no muscle wasting or atrophy  Neurologic: A&O X 3;  No focal weakness or paresthesias are detected Psychiatric:  The pt has Normal affect.   Non-Invasive Vascular Imaging:   VAS [place] lower extremity venous Bilat (DVT) Summary:  RIGHT:  - There is no evidence of deep vein thrombosis in the lower extremity.  - There is no evidence of superficial venous thrombosis.     - No cystic structure found in the popliteal fossa.     LEFT:  - Findings consistent with chronic deep vein thrombosis involving the left femoral vein, and left popliteal vein.  - Findings consistent with chronic superficial vein thrombosis involving the left great saphenous vein (very small area in the mid calf).     - No cystic structure found in the popliteal fossa.    ASSESSMENT/PLAN:: 51 y.o. male here for follow up for DVT.  The patient has undergone successful mechanical thrombectomy and venoplasty.  No stent was placed as there did not appear to be any extrinsic compression of the veins.  He continues to have some numbness around his first and second left toes.  This was likely secondary to nerve irritation from the difficulties with venous cannulation. This has previously  been discussed with patient and possibility of resolution vs possibility of it being permanent. His duplex today shows No evidence of RLE DVT or SVT. Left has chronic DVT of the FV and popliteal vein, as seen previously on duplex. He also has chronic DVT of the left GSV - He will remain on Lovenox indefinitely. He will follow up with Dr. Lorenso Courier, his Hematologist - Has asked for recommendation of Hematologist at [facility] that previously was recommended for further evaluation by  Dr. Trula Slade. He was provided with this information at today's visit - Encourage continued elevation and use of compression stockings daily - Patient offered surveillance follow up in 1 year or f/u as needed. He prefers to f/u as needed   Karoline Caldwell, PA-C [facility] [phone]  Clinic MD:   Trula Slade

## 2021-08-15 ENCOUNTER — Other Ambulatory Visit: Payer: Self-pay | Admitting: Endocrinology

## 2021-09-11 ENCOUNTER — Inpatient Hospital Stay: Payer: 59

## 2021-09-11 ENCOUNTER — Inpatient Hospital Stay: Payer: 59 | Attending: Hematology and Oncology | Admitting: Hematology and Oncology

## 2021-09-11 ENCOUNTER — Other Ambulatory Visit: Payer: Self-pay

## 2021-09-11 ENCOUNTER — Other Ambulatory Visit: Payer: Self-pay | Admitting: Hematology and Oncology

## 2021-09-11 VITALS — BP 109/71 | HR 90 | Temp 98.0°F | Resp 20 | Ht 71.0 in | Wt 247.2 lb

## 2021-09-11 DIAGNOSIS — E785 Hyperlipidemia, unspecified: Secondary | ICD-10-CM | POA: Diagnosis not present

## 2021-09-11 DIAGNOSIS — I82402 Acute embolism and thrombosis of unspecified deep veins of left lower extremity: Secondary | ICD-10-CM

## 2021-09-11 DIAGNOSIS — Z87442 Personal history of urinary calculi: Secondary | ICD-10-CM | POA: Diagnosis not present

## 2021-09-11 DIAGNOSIS — Z79899 Other long term (current) drug therapy: Secondary | ICD-10-CM | POA: Diagnosis not present

## 2021-09-11 DIAGNOSIS — J45909 Unspecified asthma, uncomplicated: Secondary | ICD-10-CM | POA: Insufficient documentation

## 2021-09-11 DIAGNOSIS — G4730 Sleep apnea, unspecified: Secondary | ICD-10-CM | POA: Insufficient documentation

## 2021-09-11 DIAGNOSIS — E1165 Type 2 diabetes mellitus with hyperglycemia: Secondary | ICD-10-CM | POA: Insufficient documentation

## 2021-09-11 DIAGNOSIS — E669 Obesity, unspecified: Secondary | ICD-10-CM | POA: Insufficient documentation

## 2021-09-11 DIAGNOSIS — Z7984 Long term (current) use of oral hypoglycemic drugs: Secondary | ICD-10-CM | POA: Insufficient documentation

## 2021-09-11 DIAGNOSIS — D689 Coagulation defect, unspecified: Secondary | ICD-10-CM | POA: Diagnosis not present

## 2021-09-11 DIAGNOSIS — Z86718 Personal history of other venous thrombosis and embolism: Secondary | ICD-10-CM | POA: Diagnosis present

## 2021-09-11 DIAGNOSIS — Z7901 Long term (current) use of anticoagulants: Secondary | ICD-10-CM | POA: Diagnosis not present

## 2021-09-11 LAB — CBC WITH DIFFERENTIAL (CANCER CENTER ONLY)
Abs Immature Granulocytes: 0.03 10*3/uL (ref 0.00–0.07)
Basophils Absolute: 0 10*3/uL (ref 0.0–0.1)
Basophils Relative: 1 %
Eosinophils Absolute: 0.1 10*3/uL (ref 0.0–0.5)
Eosinophils Relative: 1 %
HCT: 44.8 % (ref 39.0–52.0)
Hemoglobin: 15.1 g/dL (ref 13.0–17.0)
Immature Granulocytes: 0 %
Lymphocytes Relative: 41 %
Lymphs Abs: 3.3 10*3/uL (ref 0.7–4.0)
MCH: 29.1 pg (ref 26.0–34.0)
MCHC: 33.7 g/dL (ref 30.0–36.0)
MCV: 86.3 fL (ref 80.0–100.0)
Monocytes Absolute: 0.6 10*3/uL (ref 0.1–1.0)
Monocytes Relative: 8 %
Neutro Abs: 4.1 10*3/uL (ref 1.7–7.7)
Neutrophils Relative %: 49 %
Platelet Count: 295 10*3/uL (ref 150–400)
RBC: 5.19 MIL/uL (ref 4.22–5.81)
RDW: 12.6 % (ref 11.5–15.5)
WBC Count: 8.1 10*3/uL (ref 4.0–10.5)
nRBC: 0 % (ref 0.0–0.2)

## 2021-09-11 LAB — CMP (CANCER CENTER ONLY)
ALT: 37 U/L (ref 0–44)
AST: 17 U/L (ref 15–41)
Albumin: 4.4 g/dL (ref 3.5–5.0)
Alkaline Phosphatase: 59 U/L (ref 38–126)
Anion gap: 8 (ref 5–15)
BUN: 22 mg/dL — ABNORMAL HIGH (ref 6–20)
CO2: 26 mmol/L (ref 22–32)
Calcium: 9.5 mg/dL (ref 8.9–10.3)
Chloride: 105 mmol/L (ref 98–111)
Creatinine: 1.02 mg/dL (ref 0.61–1.24)
GFR, Estimated: >60 mL/min (ref >60)
Glucose, Bld: 106 mg/dL — ABNORMAL HIGH (ref 70–99)
Potassium: 3.8 mmol/L (ref 3.5–5.1)
Sodium: 139 mmol/L (ref 135–145)
Total Bilirubin: 0.3 mg/dL (ref 0.3–1.2)
Total Protein: 7.4 g/dL (ref 6.5–8.1)

## 2021-09-11 MED ORDER — ENOXAPARIN SODIUM 120 MG/0.8ML IJ SOSY: 120.0000 mg | PREFILLED_SYRINGE | Freq: Two times a day (BID) | INTRAMUSCULAR | 5 refills | Status: DC
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
## 2021-09-14 ENCOUNTER — Telehealth: Payer: Self-pay | Admitting: Hematology and Oncology

## 2021-09-14 NOTE — Telephone Encounter (Signed)
Sch per 2/11 inbasket, pt aware

## 2021-11-02 ENCOUNTER — Other Ambulatory Visit: Payer: 59

## 2021-11-02 ENCOUNTER — Ambulatory Visit: Payer: 59 | Admitting: Hematology and Oncology

## 2021-11-14 ENCOUNTER — Encounter: Payer: Self-pay | Admitting: Endocrinology

## 2021-12-15 ENCOUNTER — Encounter: Payer: Self-pay | Admitting: Internal Medicine

## 2021-12-15 ENCOUNTER — Ambulatory Visit (INDEPENDENT_AMBULATORY_CARE_PROVIDER_SITE_OTHER): Payer: 59 | Admitting: Internal Medicine

## 2021-12-15 VITALS — BP 110/74 | HR 76 | Ht 71.0 in | Wt 259.4 lb

## 2021-12-15 DIAGNOSIS — E785 Hyperlipidemia, unspecified: Secondary | ICD-10-CM

## 2021-12-15 DIAGNOSIS — E1169 Type 2 diabetes mellitus with other specified complication: Secondary | ICD-10-CM

## 2021-12-15 DIAGNOSIS — E669 Obesity, unspecified: Secondary | ICD-10-CM | POA: Diagnosis not present

## 2021-12-15 DIAGNOSIS — E1165 Type 2 diabetes mellitus with hyperglycemia: Secondary | ICD-10-CM

## 2021-12-15 LAB — POCT GLYCOSYLATED HEMOGLOBIN (HGB A1C): Hemoglobin A1C: 8.8 % — AB (ref 4.0–5.6)

## 2021-12-15 MED ORDER — DAPAGLIFLOZIN PROPANEDIOL 10 MG PO TABS: 10.0000 mg | ORAL_TABLET | Freq: Every day | ORAL | 3 refills | Status: DC

## 2021-12-15 MED ORDER — METFORMIN HCL ER 500 MG PO TB24: 1000.0000 mg | ORAL_TABLET | Freq: Every day | ORAL | 3 refills | Status: DC

## 2021-12-15 NOTE — Progress Notes (Signed)
Patient ID: Bryan Mcmillan, male   DOB: 15-Apr-1971, 51 y.o.   MRN: [id number] ? ?HPI: ?Bryan Mcmillan is a 51 y.o.-year-old male, returning for follow-up for DM2, dx in 2012, non-insulin-dependent, uncontrolled, without long-term complications.  He previously saw Dr. Loanne Drilling, last visit 7.5 months ago. ? ?He relaxed his diet since last OV.  Now, he is starting to eat more salads to lose weight.  He is very active at work. ? ?Reviewed HbA1c: ?Lab Results  ?Component Value Date  ? HGBA1C 7.5 (A) 04/29/2021  ? HGBA1C 10.5 (A) 02/23/2021  ? HGBA1C 8.0 (A) 12/15/2020  ? HGBA1C 12.6 (A) 10/15/2020  ? HGBA1C 6.9 (A) 04/10/2020  ? HGBA1C 7.6 (A) 01/03/2020  ? HGBA1C 6.3 (A) 11/06/2019  ? HGBA1C 6.4 (A) 07/12/2019  ? HGBA1C 6.0 12/23/2015  ? HGBA1C 6.10 07/09/2015  ? ?Pt is on a regimen of: ?- Metformin ER 500 mg daily in am ?- Repaglinide 2 mg 3 times a day before meals ?- Rybelsus 7 mg before breakfast ?- Farxiga 10 mg daily in am - ran out 1 mo ago ?He tried Actos but this caused edema. ?Rybelsus dose was decreased because of nausea and vomiting ? ?Pt is not checking sugars. ?- am: n/c ?- 2h after b'fast: n/c ?- before lunch: n/c ?- 2h after lunch: n/c ?- before dinner: n/c ?- 2h after dinner: n/c ?- bedtime: n/c ?- nighttime: n/c ?Lowest sugar was ?; he has hypoglycemia awareness at 70.  ?Highest sugar was ?. ? ?Glucometer: none ? ?- no CKD, last BUN/creatinine:  ?Lab Results  ?Component Value Date  ? BUN 22 (H) 09/11/2021  ? BUN 20 05/04/2021  ? CREATININE 1.02 09/11/2021  ? CREATININE 1.07 05/04/2021  ?Not on ACE inhibitor/ARB. ? ?-+ HL; last set of lipids: ?Lab Results  ?Component Value Date  ? CHOL 141 09/26/2013  ? HDL 33 (L) 09/26/2013  ? [place] 75 09/26/2013  ? LDLDIRECT 171.6 05/23/2012  ? TRIG 165 (H) 09/26/2013  ? CHOLHDL 4.3 09/26/2013  ?He is not on a statin. ? ?- last eye exam was in 08/2021. No DR reportedly. ? ?- no numbness and tingling in his feet.  Last foot exam 04/2021. ? ?He has a  history of HTN, seizure disorder, depression/anxiety, obesity, clotting disease with subsequent DVTs. ? ?ROS: ?+ see HPI ?No increased urination, blurry vision, nausea, chest pain. ? ? ?Past Medical History:  ?Diagnosis Date  ? Anxiety   ? Asthma   ? Depression   ? Diabetes ([place])   ? DVT (deep venous thrombosis), hx of recurrent 05/23/2012  ? Headache(784.0)   ? frequent  ? History of blood clot in brain, 2012 - [facility] by [facility] 05/23/2012  ? History of blood clots   ? History of suicidal tendencies   ? Hyperlipemia 08/25/2012  ? Hyperlipidemia   ? Kidney disease   ? Kidney stones   ? Migraines   ? Obesity   ? Pneumonia   ? Seizure ([place])   ? Seizure disorder - followed by [place] [facility] 05/23/2012  ? Sleep apnea   ? Tuberculosis   ? ?Past Surgical History:  ?Procedure Laterality Date  ? BRAIN SURGERY    ? filter for blood clots    ? MECHANICAL THROMBECTOMY WITH AORTOGRAM AND INTERVENTION Left 11/28/2020  ? Procedure: MECHANICAL THROMBECTOMY OF LEFT COMMON ILIAC VEIN TO POPLITEAL VEIN;  Surgeon: Serafina Mitchell, MD;  Location: [place];  Service: Vascular;  Laterality: Left;  ? PERIPHERAL VASCULAR THROMBECTOMY N/A  11/25/2020  ? Procedure: PERIPHERAL VASCULAR THROMBECTOMY;  Surgeon: Serafina Mitchell, MD;  Location: [place] [facility];  Service: Cardiovascular;  Laterality: N/A;  ? TOE AMPUTATION Right 2012  ? ULTRASOUND GUIDANCE FOR VASCULAR ACCESS Left 11/28/2020  ? Procedure: ULTRASOUND GUIDANCE FOR VASCULAR ACCESS OF LEFT POPLITEAL VEIN;  Surgeon: Serafina Mitchell, MD;  [facility];  Service: Vascular;  Laterality: Left;  ? VENOGRAM Left 11/28/2020  ? Procedure: VENOGRAM;  Surgeon: Serafina Mitchell, MD;  [facility];  Service: Vascular;  Laterality: Left;  ? ?Social History  ? ?Socioeconomic History  ? Marital status: Divorced  ?  Spouse name: Not on file  ? Number of children: 1  ? Years of education: BA  ? Highest education level: Not on file  ?Occupational History  ? Occupation: CUST SERV  ?   Employer: [employer]  ?Tobacco Use  ? Smoking status: Former  ? Smokeless tobacco: Never  ?Vaping Use  ? Vaping Use: Never used  ?Substance and Sexual Activity  ? Alcohol use: No  ? Drug use: No  ? Sexual activity: Never  ?Other Topics Concern  ? Not on file  ?Social History Narrative  ? 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   ? Home Situation: lives with sister and mother  ?   ? Spiritual Beliefs:  ?   ? Lifestyle: trying to walk and working on diet  ?   ? Caffeine Use: does consume  ?   ?   ? ?Social Determinants of Health  ? ?Financial Resource Strain: Not on file  ?Food Insecurity: Not on file  ?Transportation Needs: Not on file  ?Physical Activity: Not on file  ?Stress: Not on file  ?Social Connections: Not on file  ?Intimate Partner Violence: Not on file  ? ?Current Outpatient Medications on File Prior to Visit  ?Medication Sig Dispense Refill  ? enoxaparin (LOVENOX) 120 MG/0.8ML injection Inject 0.8 mLs (120 mg total) into the skin every 12 (twelve) hours. 48 mL 5  ? hydrOXYzine (ATARAX/VISTARIL) 50 MG tablet Take 50 mg by mouth 2 (two) times daily.    ? metFORMIN (GLUCOPHAGE-XR) 500 MG 24 hr tablet Take 1 tablet (500 mg total) by mouth daily. 90 tablet 3  ? repaglinide (PRANDIN) 2 MG tablet TAKE 1 TABLET (2 MG TOTAL) BY MOUTH 3 (THREE) TIMES DAILY BEFORE MEALS. 270 tablet 4  ? Semaglutide (RYBELSUS) 7 MG TABS Take 7 mg by mouth daily. 90 tablet 3  ? traZODone (DESYREL) 50 MG tablet TAKE 1 TABLET BY MOUTH AT BEDTIME AS NEEDED AND MAY REPEAT DOSE ONE TIME IF NEEDED FOR SLEEP. (Patient taking differently: Take 50 mg by mouth at bedtime as needed for sleep (may repeat dose one time if needed).) 180 tablet 1  ? buPROPion (WELLBUTRIN XL) 150 MG 24 hr tablet Take 3 tablets (450 mg total) by mouth daily. 270 tablet 2  ? dapagliflozin propanediol (FARXIGA) 10 MG TABS tablet Take 1 tablet (10 mg total) by mouth daily before breakfast. (Patient not taking: Reported on 12/15/2021) 90 tablet 3  ?  lamoTRIgine (LAMICTAL) 200 MG tablet Take 1 tablet (200 mg total) by mouth at bedtime. 90 tablet 0  ? prazosin (MINIPRESS) 5 MG capsule Take 1 capsule (5 mg total) by mouth at bedtime. 90 capsule 0  ? ?No current facility-administered medications on file prior to visit.  ? ?Allergies  ?Allergen Reactions  ? Norco [Hydrocodone-Acetaminophen] Other (See Comments)  ?  Paralysis-everything but  breathing  ? Penicillins Other (See Comments)  ?  Did it involve swelling of the face/tongue/throat, SOB, or low BP? N/A ?Did it involve sudden or severe rash/hives, skin peeling, or any reaction on the inside of your mouth or nose? N/A ?Did you need to seek medical attention at a hospital or doctor's office? N/A ?When did it last happen? Child  ?If all above answers are ?NO?, may proceed with cephalosporin use.  ? ?Family History  ?Problem Relation Age of Onset  ? Hyperlipidemia Mother   ? Depression Mother   ? Heart disease Father   ? Stroke Other   ?     parent  ? Diabetes Other   ?     grandparent /parent  ? Obesity Other   ? Heart attack Other   ? ? ?PE: ?BP 110/74 (BP Location: Left Arm, Patient Position: Sitting, Cuff Size: Normal)   Pulse 76   Ht '5\' 11"'$  (1.803 m)   Wt 259 lb 6.4 oz (117.7 kg)   SpO2 98%   BMI 36.18 kg/m?  ?Wt Readings from Last 3 Encounters:  ?12/15/21 259 lb 6.4 oz (117.7 kg)  ?09/11/21 247 lb 3.2 oz (112.1 kg)  ?08/10/21 252 lb 11.2 oz (114.6 kg)  ? ?Constitutional: overweight, in NAD ?Eyes: PERRLA, EOMI, no exophthalmos ?ENT: moist mucous membranes, no thyromegaly, no cervical lymphadenopathy ?Cardiovascular: RRR, No MRG ?Respiratory: CTA B ?Musculoskeletal: + deformities - R foot with amputations of the first 3 toes (1991-lawnmower accident), strength intact in all 4 ?Skin: moist, warm, no rashes ?Neurological: no tremor with outstretched hands, DTR normal in all 4 ? ?ASSESSMENT: ?1. DM2, non-insulin-dependent, uncontrolled, without long-term complications, but with hyperglycemia (he had a blood  sugar of 530 on labs 03/2021) ? ?2.  Obesity class I ? ?3. HL ? ?PLAN:  ?1. Patient with long-standing, uncontrolled diabetes, on oral antidiabetic regimen, with improving control.  Last HbA1c was 7.5%, decreased f

## 2021-12-15 NOTE — Patient Instructions (Addendum)
Please continue: ?- Repaglinide 2 mg 3 times a day before meals ?- Rybelsus 7 mg before breakfast ? ?Please start back: ?- Farxiga 10 mg before breakfast ?- Metformin ER 1000 mg daily ? ?Please start checking sugars. ? ?Please return in 4-6 months with your sugar log.  ? ?

## 2021-12-16 LAB — LIPID PANEL
Cholesterol: 243 mg/dL — ABNORMAL HIGH (ref 0–200)
HDL: 49.8 mg/dL (ref >39.00)
LDL Cholesterol: 164 mg/dL — ABNORMAL HIGH (ref 0–99)
NonHDL: 192.71
Total CHOL/HDL Ratio: 5
Triglycerides: 145 mg/dL (ref 0.0–149.0)
VLDL: 29 mg/dL (ref 0.0–40.0)

## 2022-03-03 DIAGNOSIS — F419 Anxiety disorder, unspecified: Secondary | ICD-10-CM | POA: Diagnosis not present

## 2022-03-03 DIAGNOSIS — R69 Illness, unspecified: Secondary | ICD-10-CM | POA: Diagnosis not present

## 2022-03-03 DIAGNOSIS — F39 Unspecified mood [affective] disorder: Secondary | ICD-10-CM | POA: Diagnosis not present

## 2022-03-03 DIAGNOSIS — G478 Other sleep disorders: Secondary | ICD-10-CM | POA: Diagnosis not present

## 2022-03-10 ENCOUNTER — Telehealth: Payer: Self-pay | Admitting: Hematology and Oncology

## 2022-03-10 NOTE — Telephone Encounter (Signed)
Per 8/9 phone line pt called to r/s    r/s per pt request

## 2022-03-12 ENCOUNTER — Inpatient Hospital Stay: Payer: 59

## 2022-03-12 ENCOUNTER — Inpatient Hospital Stay: Payer: 59 | Admitting: Hematology and Oncology

## 2022-03-17 ENCOUNTER — Emergency Department (HOSPITAL_BASED_OUTPATIENT_CLINIC_OR_DEPARTMENT_OTHER): Payer: 59

## 2022-03-17 ENCOUNTER — Encounter (HOSPITAL_BASED_OUTPATIENT_CLINIC_OR_DEPARTMENT_OTHER): Payer: Self-pay

## 2022-03-17 ENCOUNTER — Emergency Department (HOSPITAL_BASED_OUTPATIENT_CLINIC_OR_DEPARTMENT_OTHER)
Admission: EM | Admit: 2022-03-17 | Discharge: 2022-03-17 | Disposition: A | Discharge status: Home / Self Care | Payer: 59 | Attending: Emergency Medicine | Admitting: Emergency Medicine

## 2022-03-17 ENCOUNTER — Other Ambulatory Visit: Payer: Self-pay

## 2022-03-17 DIAGNOSIS — J069 Acute upper respiratory infection, unspecified: Secondary | ICD-10-CM | POA: Insufficient documentation

## 2022-03-17 DIAGNOSIS — U071 COVID-19: Secondary | ICD-10-CM | POA: Insufficient documentation

## 2022-03-17 DIAGNOSIS — R059 Cough, unspecified: Secondary | ICD-10-CM | POA: Diagnosis not present

## 2022-03-17 LAB — SARS CORONAVIRUS 2 BY RT PCR: SARS Coronavirus 2 by RT PCR: POSITIVE — AB

## 2022-03-17 MED ORDER — FLUTICASONE PROPIONATE 50 MCG/ACT NA SUSP: 1.0000 | Freq: Every day | NASAL | 1 refills | Status: DC

## 2022-03-17 NOTE — ED Notes (Signed)
Xray now at bedside

## 2022-03-17 NOTE — Discharge Instructions (Addendum)
You were seen here today for COVID testing. You tested positive for COVID. Please make sure you quarantine and are in isolation for at least another 4 days. You can continue using over the counter cough and cold medication. I am sending you in some Flonase for you nasal congestion. You can also try nasal rinsing as well. Just make sure you are using DISTILLED water. If you have any concern, new or worsening symptoms, please return to the ED for evaluation.   Contact a health care provider if: You have symptoms of a viral illness that do not go away. Your symptoms come back after going away. Your symptoms get worse. Get help right away if you have: Trouble breathing. A severe headache or a stiff neck. Severe vomiting or pain in your abdomen. These symptoms may represent a serious problem that is an emergency. Do not wait to see if the symptoms will go away. Get medical help right away. Call your local emergency services (911 in the [place] Do not drive yourself to the hospital.

## 2022-03-17 NOTE — ED Triage Notes (Signed)
He c/o uri sx x 1 1/2 weeks; plus fatigue. He states he has a couple of close relatives who have recently tested positive for COVID. He is ambulatory and in no distress.

## 2022-03-17 NOTE — ED Provider Notes (Signed)
[facility] EMERGENCY DEPT Provider Note   CSN: [id number] Arrival date & time: 03/17/22  1640     History Chief Complaint  Patient presents with   URI    Bryan Mcmillan is a 51 y.o. male with history of blood clots presents to the ED fore valuation of cough and cold symptoms after being around two COVID + contacts.  Patient reports that the previous week he had some nasal ingestion and a light cough although was getting better.  He reports that on Monday he started to have fatigue and had a worsening head cold with nasal congestion and rhinorrhea.  Reports a mild cough and headache.  Patient reports has been trying over-the-counter cough and cold medication.  He reports that he tried it at home test that was negative.  He is concerned that he had COVID and just wanted to find out why he was having his symptoms.  Denies any chest pain, abdominal pain, nausea, vomiting, neck pain, or fever.  Denies any worsening shortness of breath.   URI Presenting symptoms: congestion, cough and rhinorrhea   Presenting symptoms: no fever and no sore throat   Associated symptoms: headaches and myalgias        Home Medications Prior to Admission medications   Medication Sig Start Date End Date Taking? Authorizing Provider  buPROPion (WELLBUTRIN XL) 150 MG 24 hr tablet Take 3 tablets (450 mg total) by mouth daily. 09/29/20 12/28/20  Pucilowski, Marchia Bond, MD  dapagliflozin propanediol (FARXIGA) 10 MG TABS tablet Take 1 tablet (10 mg total) by mouth daily before breakfast. 12/15/21   Philemon Kingdom, MD  enoxaparin (LOVENOX) 120 MG/0.8ML injection Inject 0.8 mLs (120 mg total) into the skin every 12 (twelve) hours. 09/11/21 03/10/22  Orson Slick, MD  hydrOXYzine (ATARAX/VISTARIL) 50 MG tablet Take 50 mg by mouth 2 (two) times daily. 11/27/20   [provider]  lamoTRIgine (LAMICTAL) 200 MG tablet Take 1 tablet (200 mg total) by mouth at bedtime. 09/29/20 12/28/20   Pucilowski, Marchia Bond, MD  metFORMIN (GLUCOPHAGE-XR) 500 MG 24 hr tablet Take 2 tablets (1,000 mg total) by mouth daily. 12/15/21   Philemon Kingdom, MD  prazosin (MINIPRESS) 5 MG capsule Take 1 capsule (5 mg total) by mouth at bedtime. 11/09/20 02/07/21  Pucilowski, Olgierd A, MD  repaglinide (PRANDIN) 2 MG tablet TAKE 1 TABLET (2 MG TOTAL) BY MOUTH 3 (THREE) TIMES DAILY BEFORE MEALS. 08/17/21   Renato Shin, MD  Semaglutide (RYBELSUS) 7 MG TABS Take 7 mg by mouth daily. 04/29/21   Renato Shin, MD  traZODone (DESYREL) 50 MG tablet TAKE 1 TABLET BY MOUTH AT BEDTIME AS NEEDED AND MAY REPEAT DOSE ONE TIME IF NEEDED FOR SLEEP. Patient taking differently: Take 50 mg by mouth at bedtime as needed for sleep (may repeat dose one time if needed). 02/26/20   Pucilowski, Marchia Bond, MD      Allergies    Norco [hydrocodone-acetaminophen] and Penicillins    Review of Systems   Review of Systems  Constitutional:  Positive for chills. Negative for fever.  HENT:  Positive for congestion and rhinorrhea. Negative for sore throat.   Eyes:  Negative for photophobia and visual disturbance.  Respiratory:  Positive for cough. Negative for shortness of breath.   Cardiovascular:  Negative for chest pain.  Gastrointestinal:  Negative for abdominal pain, nausea and vomiting.  Genitourinary:  Negative for dysuria and hematuria.  Musculoskeletal:  Positive for myalgias.  Neurological:  Positive for headaches.  Physical Exam Updated Vital Signs BP 119/69 (BP Location: Right Arm)   Pulse 87   Temp 97.9 F (36.6 C) (Oral)   Resp 18   Ht '5\' 10"'$  (1.778 m)   Wt 110.2 kg   SpO2 97%   BMI 34.87 kg/m  Physical Exam Vitals and nursing note reviewed.  Constitutional:      General: He is not in acute distress.    Appearance: He is not toxic-appearing.  HENT:     Head: Normocephalic and atraumatic.     Right Ear: Tympanic membrane, ear canal and external ear normal.     Left Ear: Tympanic membrane, ear canal and  external ear normal.     Nose:     Comments: Bilateral nasal turbinate edema and erythema with scant clear nasal discharge.    Mouth/Throat:     Mouth: Mucous membranes are moist.     Comments: No pharyngeal erythema, exudate, or edema noted.  Uvula midline.  Airway patent.  Moist mucous membranes. Eyes:     General: No scleral icterus.    Conjunctiva/sclera: Conjunctivae normal.  Cardiovascular:     Rate and Rhythm: Normal rate.  Pulmonary:     Effort: Pulmonary effort is normal. No respiratory distress.     Breath sounds: Normal breath sounds. No wheezing or rhonchi.  Abdominal:     General: Abdomen is flat.     Palpations: Abdomen is soft.  Musculoskeletal:        General: No deformity.     Cervical back: Normal range of motion.  Skin:    Findings: No rash.  Neurological:     General: No focal deficit present.     Mental Status: He is alert.     ED Results / Procedures / Treatments   Labs (all labs ordered are listed, but only abnormal results are displayed) Labs Reviewed  SARS CORONAVIRUS 2 BY RT PCR - Abnormal; Notable for the following components:      Result Value   SARS Coronavirus 2 by RT PCR POSITIVE (*)    All other components within normal limits    EKG None  Radiology DG Chest Portable 1 View  Result Date: 03/17/2022 CLINICAL DATA:  Upper respiratory symptoms. EXAM: PORTABLE CHEST 1 VIEW COMPARISON:  November 21, 2020 FINDINGS: The heart size and mediastinal contours are within normal limits. Both lungs are clear. The visualized skeletal structures are unremarkable. IMPRESSION: No active disease. Electronically Signed   By: Virgina Norfolk M.D.   On: 03/17/2022 20:34    Procedures Procedures   Medications Ordered in ED Medications - No data to display  ED Course/ Medical Decision Making/ A&P                           Medical Decision Making Amount and/or Complexity of Data Reviewed Radiology: ordered.   51 year old male presents the emergency  room for evaluation of cough and cold symptoms for the past few days.  Differential diagnosis includes but is not limited to COVID, flu, sinusitis, pneumonia, bronchitis, viral illness.  Vital signs show slight decrease in temperature 97.3 otherwise normotensive, normal pulse rate, satting well on room air with any increased work of breathing.  Physical exam shows some bilateral nasal turbinate edema and erythema with scant clear nasal discharge.  No pharyngeal erythema edema or exam noted.  Patient tested positive for COVID.  Patient is out of any window for any Paxlovid given that is been more than  48-72 hours.  I discussed the patient's positive COVID status with them.  Recommended that he continue using the over-the-counter medications as he has been.  I will also send him home with some Flonase help with his nasal congestion.  Also added additional information on nasal rinses.  Recommended isolation and quarantine.  Gave work note although patient's not currently working.  We discussed return precautions and red flag symptoms.  Patient verbalized understanding agrees to the plan.  Patient is stable and being discharged home in good condition.  Final Clinical Impression(s) / ED Diagnoses Final diagnoses:  COVID    Rx / DC Orders ED Discharge Orders          Ordered    fluticasone (FLONASE) 50 MCG/ACT nasal spray  Daily        03/17/22 2130              Sherrell Puller, PA-C 03/18/22 2312    Drenda Freeze, MD 03/19/22 [phone]

## 2022-03-19 ENCOUNTER — Other Ambulatory Visit: Payer: Self-pay

## 2022-03-19 DIAGNOSIS — E1165 Type 2 diabetes mellitus with hyperglycemia: Secondary | ICD-10-CM

## 2022-03-19 MED ORDER — REPAGLINIDE 2 MG PO TABS: 2.0000 mg | ORAL_TABLET | Freq: Three times a day (TID) | ORAL | 4 refills | Status: DC

## 2022-03-24 ENCOUNTER — Other Ambulatory Visit (HOSPITAL_COMMUNITY): Payer: Self-pay

## 2022-04-13 ENCOUNTER — Telehealth: Payer: Self-pay

## 2022-04-13 ENCOUNTER — Inpatient Hospital Stay: Payer: 59 | Admitting: Hematology and Oncology

## 2022-04-13 ENCOUNTER — Inpatient Hospital Stay: Payer: 59 | Attending: Hematology and Oncology

## 2022-04-13 ENCOUNTER — Other Ambulatory Visit: Payer: Self-pay | Admitting: Hematology and Oncology

## 2022-04-13 DIAGNOSIS — Z86718 Personal history of other venous thrombosis and embolism: Secondary | ICD-10-CM | POA: Insufficient documentation

## 2022-04-13 DIAGNOSIS — I82402 Acute embolism and thrombosis of unspecified deep veins of left lower extremity: Secondary | ICD-10-CM

## 2022-04-13 DIAGNOSIS — Z87891 Personal history of nicotine dependence: Secondary | ICD-10-CM | POA: Insufficient documentation

## 2022-04-13 DIAGNOSIS — E785 Hyperlipidemia, unspecified: Secondary | ICD-10-CM | POA: Insufficient documentation

## 2022-04-13 DIAGNOSIS — G4733 Obstructive sleep apnea (adult) (pediatric): Secondary | ICD-10-CM | POA: Insufficient documentation

## 2022-04-13 DIAGNOSIS — Z7901 Long term (current) use of anticoagulants: Secondary | ICD-10-CM | POA: Insufficient documentation

## 2022-04-13 DIAGNOSIS — E119 Type 2 diabetes mellitus without complications: Secondary | ICD-10-CM | POA: Insufficient documentation

## 2022-04-13 DIAGNOSIS — E669 Obesity, unspecified: Secondary | ICD-10-CM | POA: Insufficient documentation

## 2022-04-13 NOTE — Telephone Encounter (Signed)
Erroneous entry

## 2022-04-22 ENCOUNTER — Other Ambulatory Visit: Payer: Self-pay

## 2022-04-22 ENCOUNTER — Inpatient Hospital Stay (HOSPITAL_BASED_OUTPATIENT_CLINIC_OR_DEPARTMENT_OTHER): Payer: 59 | Admitting: Hematology and Oncology

## 2022-04-22 ENCOUNTER — Inpatient Hospital Stay: Payer: 59

## 2022-04-22 VITALS — BP 124/87 | HR 98 | Temp 98.3°F | Resp 16 | Wt 242.5 lb

## 2022-04-22 DIAGNOSIS — I82402 Acute embolism and thrombosis of unspecified deep veins of left lower extremity: Secondary | ICD-10-CM | POA: Diagnosis not present

## 2022-04-22 DIAGNOSIS — E785 Hyperlipidemia, unspecified: Secondary | ICD-10-CM | POA: Diagnosis not present

## 2022-04-22 DIAGNOSIS — Z7901 Long term (current) use of anticoagulants: Secondary | ICD-10-CM | POA: Diagnosis not present

## 2022-04-22 DIAGNOSIS — Z87891 Personal history of nicotine dependence: Secondary | ICD-10-CM | POA: Diagnosis not present

## 2022-04-22 DIAGNOSIS — G4733 Obstructive sleep apnea (adult) (pediatric): Secondary | ICD-10-CM | POA: Diagnosis not present

## 2022-04-22 DIAGNOSIS — E119 Type 2 diabetes mellitus without complications: Secondary | ICD-10-CM | POA: Diagnosis not present

## 2022-04-22 DIAGNOSIS — E669 Obesity, unspecified: Secondary | ICD-10-CM | POA: Diagnosis not present

## 2022-04-22 DIAGNOSIS — D689 Coagulation defect, unspecified: Secondary | ICD-10-CM

## 2022-04-22 DIAGNOSIS — Z86718 Personal history of other venous thrombosis and embolism: Secondary | ICD-10-CM | POA: Diagnosis not present

## 2022-04-22 LAB — CMP (CANCER CENTER ONLY)
ALT: 17 U/L (ref 0–44)
AST: 12 U/L — ABNORMAL LOW (ref 15–41)
Albumin: 4.5 g/dL (ref 3.5–5.0)
Alkaline Phosphatase: 58 U/L (ref 38–126)
Anion gap: 7 (ref 5–15)
BUN: 18 mg/dL (ref 6–20)
CO2: 29 mmol/L (ref 22–32)
Calcium: 10 mg/dL (ref 8.9–10.3)
Chloride: 103 mmol/L (ref 98–111)
Creatinine: 1.17 mg/dL (ref 0.61–1.24)
GFR, Estimated: >60 mL/min (ref >60)
Glucose, Bld: 236 mg/dL — ABNORMAL HIGH (ref 70–99)
Potassium: 4.2 mmol/L (ref 3.5–5.1)
Sodium: 139 mmol/L (ref 135–145)
Total Bilirubin: 0.4 mg/dL (ref 0.3–1.2)
Total Protein: 7.6 g/dL (ref 6.5–8.1)

## 2022-04-22 LAB — CBC WITH DIFFERENTIAL (CANCER CENTER ONLY)
Abs Immature Granulocytes: 0.04 10*3/uL (ref 0.00–0.07)
Basophils Absolute: 0 10*3/uL (ref 0.0–0.1)
Basophils Relative: 0 %
Eosinophils Absolute: 0.1 10*3/uL (ref 0.0–0.5)
Eosinophils Relative: 1 %
HCT: 48.8 % (ref 39.0–52.0)
Hemoglobin: 16.7 g/dL (ref 13.0–17.0)
Immature Granulocytes: 1 %
Lymphocytes Relative: 42 %
Lymphs Abs: 3.3 10*3/uL (ref 0.7–4.0)
MCH: 29.5 pg (ref 26.0–34.0)
MCHC: 34.2 g/dL (ref 30.0–36.0)
MCV: 86.1 fL (ref 80.0–100.0)
Monocytes Absolute: 0.5 10*3/uL (ref 0.1–1.0)
Monocytes Relative: 7 %
Neutro Abs: 3.8 10*3/uL (ref 1.7–7.7)
Neutrophils Relative %: 49 %
Platelet Count: 307 10*3/uL (ref 150–400)
RBC: 5.67 MIL/uL (ref 4.22–5.81)
RDW: 12.1 % (ref 11.5–15.5)
WBC Count: 7.7 10*3/uL (ref 4.0–10.5)
nRBC: 0 % (ref 0.0–0.2)
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
## 2022-04-30 DIAGNOSIS — F39 Unspecified mood [affective] disorder: Secondary | ICD-10-CM | POA: Diagnosis not present

## 2022-04-30 DIAGNOSIS — G478 Other sleep disorders: Secondary | ICD-10-CM | POA: Diagnosis not present

## 2022-04-30 DIAGNOSIS — R69 Illness, unspecified: Secondary | ICD-10-CM | POA: Diagnosis not present

## 2022-04-30 DIAGNOSIS — F419 Anxiety disorder, unspecified: Secondary | ICD-10-CM | POA: Diagnosis not present

## 2022-05-26 DIAGNOSIS — G478 Other sleep disorders: Secondary | ICD-10-CM | POA: Diagnosis not present

## 2022-05-26 DIAGNOSIS — R69 Illness, unspecified: Secondary | ICD-10-CM | POA: Diagnosis not present

## 2022-05-26 DIAGNOSIS — F39 Unspecified mood [affective] disorder: Secondary | ICD-10-CM | POA: Diagnosis not present

## 2022-05-26 DIAGNOSIS — F419 Anxiety disorder, unspecified: Secondary | ICD-10-CM | POA: Diagnosis not present

## 2022-06-14 ENCOUNTER — Ambulatory Visit: Payer: 59 | Admitting: Internal Medicine
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
## 2022-07-14 ENCOUNTER — Other Ambulatory Visit: Payer: Self-pay | Admitting: *Deleted

## 2022-07-14 MED ORDER — ENOXAPARIN SODIUM 120 MG/0.8ML IJ SOSY: 120.0000 mg | PREFILLED_SYRINGE | Freq: Two times a day (BID) | INTRAMUSCULAR | 5 refills | Status: DC

## 2022-08-18 DIAGNOSIS — F39 Unspecified mood [affective] disorder: Secondary | ICD-10-CM | POA: Diagnosis not present

## 2022-08-18 DIAGNOSIS — F419 Anxiety disorder, unspecified: Secondary | ICD-10-CM | POA: Diagnosis not present

## 2022-08-18 DIAGNOSIS — G478 Other sleep disorders: Secondary | ICD-10-CM | POA: Diagnosis not present

## 2022-08-18 DIAGNOSIS — R69 Illness, unspecified: Secondary | ICD-10-CM | POA: Diagnosis not present

## 2022-09-02 ENCOUNTER — Other Ambulatory Visit (HOSPITAL_COMMUNITY): Payer: Self-pay

## 2022-10-21 ENCOUNTER — Other Ambulatory Visit: Payer: Self-pay | Admitting: Hematology and Oncology

## 2022-10-21 DIAGNOSIS — I82402 Acute embolism and thrombosis of unspecified deep veins of left lower extremity: Secondary | ICD-10-CM

## 2022-10-22 ENCOUNTER — Inpatient Hospital Stay: Payer: 59 | Attending: Internal Medicine

## 2022-10-22 ENCOUNTER — Inpatient Hospital Stay: Payer: 59 | Admitting: Hematology and Oncology

## 2023-09-23 ENCOUNTER — Telehealth: Payer: Self-pay | Admitting: *Deleted

## 2023-09-23 ENCOUNTER — Inpatient Hospital Stay: Payer: BC Managed Care – PPO

## 2023-09-23 ENCOUNTER — Inpatient Hospital Stay: Payer: BC Managed Care – PPO | Attending: Hematology and Oncology | Admitting: Hematology and Oncology

## 2023-09-23 VITALS — BP 127/75 | HR 84 | Temp 98.6°F | Resp 15 | Wt 223.5 lb

## 2023-09-23 DIAGNOSIS — Z86718 Personal history of other venous thrombosis and embolism: Secondary | ICD-10-CM | POA: Diagnosis not present

## 2023-09-23 DIAGNOSIS — Z7901 Long term (current) use of anticoagulants: Secondary | ICD-10-CM | POA: Insufficient documentation

## 2023-09-23 DIAGNOSIS — E1165 Type 2 diabetes mellitus with hyperglycemia: Secondary | ICD-10-CM | POA: Diagnosis not present

## 2023-09-23 DIAGNOSIS — I82402 Acute embolism and thrombosis of unspecified deep veins of left lower extremity: Secondary | ICD-10-CM

## 2023-09-23 DIAGNOSIS — D689 Coagulation defect, unspecified: Secondary | ICD-10-CM | POA: Diagnosis not present

## 2023-09-23 LAB — CMP (CANCER CENTER ONLY)
ALT: 16 U/L (ref 0–44)
AST: 13 U/L — ABNORMAL LOW (ref 15–41)
Albumin: 4 g/dL (ref 3.5–5.0)
Alkaline Phosphatase: 69 U/L (ref 38–126)
Anion gap: 6 (ref 5–15)
BUN: 13 mg/dL (ref 6–20)
CO2: 27 mmol/L (ref 22–32)
Calcium: 9.2 mg/dL (ref 8.9–10.3)
Chloride: 98 mmol/L (ref 98–111)
Creatinine: 0.92 mg/dL (ref 0.61–1.24)
GFR, Estimated: >60 mL/min (ref >60)
Glucose, Bld: 458 mg/dL — ABNORMAL HIGH (ref 70–99)
Potassium: 4.4 mmol/L (ref 3.5–5.1)
Sodium: 131 mmol/L — ABNORMAL LOW (ref 135–145)
Total Bilirubin: 0.5 mg/dL (ref 0.0–1.2)
Total Protein: 6.7 g/dL (ref 6.5–8.1)

## 2023-09-23 LAB — CBC WITH DIFFERENTIAL (CANCER CENTER ONLY)
Abs Immature Granulocytes: 0.05 10*3/uL (ref 0.00–0.07)
Basophils Absolute: 0.1 10*3/uL (ref 0.0–0.1)
Basophils Relative: 1 %
Eosinophils Absolute: 0.2 10*3/uL (ref 0.0–0.5)
Eosinophils Relative: 3 %
HCT: 45.2 % (ref 39.0–52.0)
Hemoglobin: 15.6 g/dL (ref 13.0–17.0)
Immature Granulocytes: 1 %
Lymphocytes Relative: 35 %
Lymphs Abs: 2.1 10*3/uL (ref 0.7–4.0)
MCH: 29.5 pg (ref 26.0–34.0)
MCHC: 34.5 g/dL (ref 30.0–36.0)
MCV: 85.4 fL (ref 80.0–100.0)
Monocytes Absolute: 0.4 10*3/uL (ref 0.1–1.0)
Monocytes Relative: 7 %
Neutro Abs: 3.3 10*3/uL (ref 1.7–7.7)
Neutrophils Relative %: 53 %
Platelet Count: 307 10*3/uL (ref 150–400)
RBC: 5.29 MIL/uL (ref 4.22–5.81)
RDW: 12.3 % (ref 11.5–15.5)
WBC Count: 6.1 10*3/uL (ref 4.0–10.5)
nRBC: 0 % (ref 0.0–0.2)

## 2023-09-23 MED ORDER — METFORMIN HCL ER 500 MG PO TB24: 1000.0000 mg | ORAL_TABLET | Freq: Every day | ORAL | 3 refills | Status: DC

## 2023-09-23 MED ORDER — ENOXAPARIN SODIUM 100 MG/ML IJ SOSY: 100.0000 mg | PREFILLED_SYRINGE | Freq: Two times a day (BID) | INTRAMUSCULAR | 6 refills | Status: AC
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
## 2023-09-23 NOTE — Telephone Encounter (Addendum)
Bryan Mcmillan in to see this forms nurse before MD F/U visit.  Requests records be released to his daughter's provider in [place].  Will obtain most recent revised H.I.P.A.A. Authorization for Use/Disclosure for patient signature to fax request to () H.I.M. office [phone]).  10:10 am Bryan Mcmillan returned post F/U visit.  Reviewed authorization form.  Obtained signature of [facility] H.I.P.A.A. Authorization.  Successfully faxed to Health Information Management.

## 2023-09-23 NOTE — Progress Notes (Signed)
Pt's current labs fax'd to Dr. Luanne Bras his CMP as his blood glucose was elevated at 458.

## 2023-10-21 ENCOUNTER — Encounter: Payer: Self-pay | Admitting: Endocrinology

## 2023-10-21 ENCOUNTER — Ambulatory Visit: Payer: BC Managed Care – PPO | Admitting: Endocrinology

## 2023-10-21 VITALS — BP 110/64 | HR 78 | Resp 20 | Ht 70.0 in | Wt 222.4 lb

## 2023-10-21 DIAGNOSIS — E1165 Type 2 diabetes mellitus with hyperglycemia: Secondary | ICD-10-CM | POA: Diagnosis not present

## 2023-10-21 DIAGNOSIS — Z7984 Long term (current) use of oral hypoglycemic drugs: Secondary | ICD-10-CM

## 2023-10-21 DIAGNOSIS — E119 Type 2 diabetes mellitus without complications: Secondary | ICD-10-CM

## 2023-10-21 DIAGNOSIS — Z794 Long term (current) use of insulin: Secondary | ICD-10-CM | POA: Diagnosis not present

## 2023-10-21 LAB — POCT GLYCOSYLATED HEMOGLOBIN (HGB A1C): HbA1c POC (<> result, manual entry): >15 % (ref 4.0–5.6)

## 2023-10-21 MED ORDER — METFORMIN HCL ER 500 MG PO TB24: 2000.0000 mg | ORAL_TABLET | Freq: Every day | ORAL | 3 refills | Status: DC

## 2023-10-21 MED ORDER — LANTUS SOLOSTAR 100 UNIT/ML ~~LOC~~ SOPN: 25.0000 [IU] | PEN_INJECTOR | Freq: Every day | SUBCUTANEOUS | 11 refills | Status: DC

## 2023-10-21 MED ORDER — FREESTYLE LIBRE 3 PLUS SENSOR MISC: 1.0000 | 3 refills | Status: DC

## 2023-10-21 MED ORDER — DEXCOM G7 SENSOR MISC: 1.0000 | 0 refills | Status: DC

## 2023-10-21 MED ORDER — GLIMEPIRIDE 4 MG PO TABS
4.0000 mg | ORAL_TABLET | Freq: Every day | ORAL | 3 refills | Status: DC
Start: 2023-10-21 — End: 2024-02-22

## 2023-10-21 NOTE — Progress Notes (Signed)
 Outpatient Endocrinology Note Bryan Shawnika Pepin, MD   Patient's Name: Bryan Mcmillan    DOB: 07-10-1971    MRN: [id number]                                                    REASON OF VISIT: Follow up for type 2 diabetes mellitus  PCP: Philip Aspen, Limmie Patricia, MD  HISTORY OF PRESENT ILLNESS:   Bryan Mcmillan is a 53 y.o. old male with past medical history listed below, is here for follow up for type 2 diabetes mellitus.   Pertinent Diabetes History: Patient was previously seen by Dr. Everardo All and Dr. Elvera Lennox in the past, last time was seen by Dr. Elvera Lennox in May 2023.  Patient reports at some point he was in [place] and also did not have medical insurance, patient has  medical insurance now and presented today to reestablish diabetes care.  Patient was diagnosed with type 2 diabetes mellitus in 2012.  In the past he used to be on up to 4 oral antidiabetic medications.  He had taken insulin therapy from 2012-2015.  He does not want to add another injection to Lovenox.  He prefers not to be on injectable antidiabetic medication.  History of DKA or diabetes related hospitalizations: none  Previous diabetes education: Yes   Family h/o diabetes mellitus: mother type 2 diabetes mellitus.    Patient used to have relatively reasonable control of diabetes until 2021, from March 2022 he is having worsening diabetic control with uncontrolled type 2 diabetes mellitus.  Hemoglobin A1c was 8.8% in May 2023.  He reports he was not taking any diabetic medications for about a year and restarted metformin extended release 1000 mg daily about 1 month ago.  He used to be on Rybelsus 7 mg daily, repaglinide 2 mg 3 times a day, metformin extended release 500 mg once a day, Farxiga 10 mg daily.  Chronic Diabetes Complications : Retinopathy: no. Last ophthalmology exam was done on Due, following with ophthalmology regularly. About 2 years ago.  Nephropathy: no Peripheral neuropathy:  no Coronary artery disease: no Stroke: no  Relevant comorbidities and cardiovascular risk factors: Obesity: yes Body mass index is 31.91 kg/m.  Hypertension: no Hyperlipidemia : Yes, not on statin   Current / Home Diabetic regimen includes:  Metformin XR 500 mg 2 tab daily for a month.  Prior diabetic medications: In the past, glimepiride change to repaglinide due to hypoglycemia. Glipizide hypoglycemia.  He had edema with Actos.  Rybelsus dose was decreased because of nausea and vomiting in the past.   Glycemic data:   No glucose data to review.  He does not monitor blood sugar at home.  He does not like fingerstick blood sugar monitoring.  He had tried freestyle libre CGM in the past however had adhesive problem.  Hypoglycemia: Patient has no hypoglycemic episodes. Patient has hypoglycemia awareness.  Factors modifying glucose control: 1.  Diabetic diet assessment: 3 meals a day, more protein and vegetables.   2.  Staying active or exercising: none  3.  Medication compliance: compliant most of the time.  Interval history  Patient presented to reestablish diabetes care.  He reports he was not taking any of the diabetic medication for about a year until last month he restarted metformin extended release.  Diabetes regimen in detail as noted above.  He used to be on 4 oral antidiabetic medication in the past.  Glucose data to review.  Has complaints of mild numbness of the fingertips.  He had diabetic eye exam about 1 and half to 2 years ago and reports no diabetic retinopathy.  He prefers not to be on injectable medication including insulin.  He does not want to add injectable medicine in addition to Lovenox he is currently taking.  Hemoglobin A1c today >15%  REVIEW OF SYSTEMS As per history of present illness.   PAST MEDICAL HISTORY: Past Medical History:  Diagnosis Date   Anxiety    Asthma    Depression    Diabetes (HCC)    DVT (deep venous thrombosis), hx of  recurrent 05/23/2012   Headache(784.0)    frequent   History of blood clot in brain, 2012 - followed by [facility] 05/23/2012   History of blood clots    History of suicidal tendencies    Hyperlipemia 08/25/2012   Hyperlipidemia    Kidney disease    Kidney stones    Migraines    Obesity    Pneumonia    Seizure (HCC)    Seizure disorder - followed by [facility] 05/23/2012   Sleep apnea    Tuberculosis     PAST SURGICAL HISTORY: Past Surgical History:  Procedure Laterality Date   BRAIN SURGERY     filter for blood clots     MECHANICAL THROMBECTOMY WITH AORTOGRAM AND INTERVENTION Left 11/28/2020   Procedure: MECHANICAL THROMBECTOMY OF LEFT COMMON ILIAC VEIN TO POPLITEAL VEIN;  Surgeon: Nada Libman, MD;  Location: [facility] OR;  Service: Vascular;  Laterality: Left;   PERIPHERAL VASCULAR THROMBECTOMY N/A 11/25/2020   Procedure: PERIPHERAL VASCULAR THROMBECTOMY;  Surgeon: Nada Libman, MD;  Location: [facility] INVASIVE CV LAB;  Service: Cardiovascular;  Laterality: N/A;   TOE AMPUTATION Right 2012   ULTRASOUND GUIDANCE FOR VASCULAR ACCESS Left 11/28/2020   Procedure: ULTRASOUND GUIDANCE FOR VASCULAR ACCESS OF LEFT POPLITEAL VEIN;  Surgeon: Nada Libman, MD;  Location: [facility] OR;  Service: Vascular;  Laterality: Left;   VENOGRAM Left 11/28/2020   Procedure: VENOGRAM;  Surgeon: Nada Libman, MD;  Location: [facility] OR;  Service: Vascular;  Laterality: Left;    ALLERGIES: Allergies  Allergen Reactions   Norco [Hydrocodone-Acetaminophen] Other (See Comments)    Paralysis-everything but breathing   Penicillins Other (See Comments)    Did it involve swelling of the face/tongue/throat, SOB, or low BP? N/A Did it involve sudden or severe rash/hives, skin peeling, or any reaction on the inside of your mouth or nose? N/A Did you need to seek medical attention at a hospital or doctor's office? N/A When did it last happen? Child  If all above answers are "NO", may proceed with cephalosporin  use.    FAMILY HISTORY:  Family History  Problem Relation Age of Onset   Hyperlipidemia Mother    Depression Mother    Heart disease Father    Stroke Other        parent   Diabetes Other        grandparent /parent   Obesity Other    Heart attack Other     SOCIAL HISTORY: Social History   Socioeconomic History   Marital status: Divorced    Spouse name: Not on file   Number of children: 1   Years of education: BA   Highest education level: Not on file  Occupational History   Occupation: CUST SERV    Employer:  [employer]  Tobacco Use   Smoking status: Former   Smokeless tobacco: Never  Vaping Use   Vaping status: Never Used  Substance and Sexual Activity   Alcohol use: No   Drug use: No   Sexual activity: Never  Other Topics Concern   Not on file  Social History Narrative   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[employer], Herbalist Situation: lives with sister and mother      Spiritual Beliefs:      Lifestyle: trying to walk and working on diet      Caffeine Use: does consume         Social Drivers of Corporate investment banker Strain: Not on file  Food Insecurity: No Food Insecurity (05/08/2020)   Hunger Vital Sign    Worried About Running Out of Food in the Last Year: Never true    Ran Out of Food in the Last Year: Never true  Transportation Needs: Not on file  Physical Activity: Not on file  Stress: Not on file  Social Connections: Not on file    MEDICATIONS:  Current Outpatient Medications  Medication Sig Dispense Refill   Continuous Glucose Sensor (DEXCOM G7 SENSOR) MISC 1 Device by Does not apply route continuous. Change every 10 days. 9 each 0   Continuous Glucose Sensor (FREESTYLE LIBRE 3 PLUS SENSOR) MISC 1 each by Does not apply route continuous. Change every 15 days. 6 each 3   enoxaparin (LOVENOX) 100 MG/ML injection Inject 1 mL (100 mg total) into the skin every 12 (twelve) hours. 60 mL 6   glimepiride (AMARYL) 4 MG tablet  Take 1 tablet (4 mg total) by mouth daily before breakfast. 30 tablet 3   insulin glargine (LANTUS SOLOSTAR) 100 UNIT/ML Solostar Pen Inject 25 Units into the skin daily. 15 mL 11   buPROPion (WELLBUTRIN XL) 150 MG 24 hr tablet Take 3 tablets (450 mg total) by mouth daily. 270 tablet 2   dapagliflozin propanediol (FARXIGA) 10 MG TABS tablet Take 1 tablet (10 mg total) by mouth daily before breakfast. (Patient not taking: Reported on 10/21/2023) 90 tablet 3   fluticasone (FLONASE) 50 MCG/ACT nasal spray Place 1 spray into both nostrils daily. (Patient not taking: Reported on 10/21/2023) 18.2 mL 1   hydrOXYzine (ATARAX/VISTARIL) 50 MG tablet Take 50 mg by mouth 2 (two) times daily. (Patient not taking: Reported on 10/21/2023)     lamoTRIgine (LAMICTAL) 200 MG tablet Take 1 tablet (200 mg total) by mouth at bedtime. 90 tablet 0   metFORMIN (GLUCOPHAGE-XR) 500 MG 24 hr tablet Take 4 tablets (2,000 mg total) by mouth daily. 360 tablet 3   prazosin (MINIPRESS) 5 MG capsule Take 1 capsule (5 mg total) by mouth at bedtime. 90 capsule 0   repaglinide (PRANDIN) 2 MG tablet Take 1 tablet (2 mg total) by mouth 3 (three) times daily before meals. (Patient not taking: Reported on 10/21/2023) 270 tablet 4   Semaglutide (RYBELSUS) 7 MG TABS Take 7 mg by mouth daily. (Patient not taking: Reported on 10/21/2023) 90 tablet 3   traZODone (DESYREL) 50 MG tablet TAKE 1 TABLET BY MOUTH AT BEDTIME AS NEEDED AND MAY REPEAT DOSE ONE TIME IF NEEDED FOR SLEEP. (Patient not taking: No sig reported) 180 tablet 1   No current facility-administered medications for this visit.    PHYSICAL EXAM: Vitals:   10/21/23 0917  BP: 110/64  Pulse: 78  Resp: 20  SpO2: 98%  Weight:  222 lb 6.4 oz (100.9 kg)  Height: 5\' 10"  (1.778 m)   Body mass index is 31.91 kg/m.  Wt Readings from Last 3 Encounters:  10/21/23 222 lb 6.4 oz (100.9 kg)  09/23/23 223 lb 8 oz (101.4 kg)  04/22/22 242 lb 8 oz (110 kg)    General: Well developed, well  nourished male in no apparent distress.  HEENT: AT/[place], no external lesions.  Eyes: Conjunctiva clear and no icterus. Neck: Neck supple  Lungs: Respirations not labored Neurologic: Alert, oriented, normal speech Extremities / Skin: Dry.  Psychiatric: Does not appear depressed or anxious  Diabetic Foot Exam - Simple   Simple Foot Form Visual Inspection See comments: Yes Sensation Testing Intact to touch and monofilament testing bilaterally: Yes Pulse Check Posterior Tibialis and Dorsalis pulse intact bilaterally: Yes Comments Right 1-3 toes traumatic amputation long time ago.     LABS Reviewed Lab Results  Component Value Date   HGBA1C >15.0 10/21/2023   HGBA1C 8.8 (A) 12/15/2021   HGBA1C 7.5 (A) 04/29/2021   Lab Results  Component Value Date   FRUCTOSAMINE 482 (H) 10/15/2020   Lab Results  Component Value Date   CHOL 243 (H) 12/15/2021   HDL 49.80 12/15/2021   LDLCALC 164 (H) 12/15/2021   LDLDIRECT 171.6 05/23/2012   TRIG 145.0 12/15/2021   CHOLHDL 5 12/15/2021   Lab Results  Component Value Date   MICRALBCREAT 0.7 12/24/2014   MICRALBCREAT 1.0 12/19/2013   Lab Results  Component Value Date   CREATININE 0.92 09/23/2023   Lab Results  Component Value Date   GFR 101.85 10/15/2020    ASSESSMENT / PLAN  1. Type 2 diabetes mellitus with hyperglycemia, with long-term current use of insulin (HCC)   2. Uncontrolled type 2 diabetes mellitus with hyperglycemia (HCC)   3. Type 2 diabetes mellitus without complication, with long-term current use of insulin (HCC)     Diabetes Mellitus type 2, complicated by no known complications. - Diabetic status / severity: Uncontrolled/poorly controlled.  Lab Results  Component Value Date   HGBA1C >15.0 10/21/2023    - Hemoglobin A1c goal : <6.5%  Discussed about type 2 diabetes mellitus, importance of controlling diabetes and to prevent potential chronic complications including diabetes retinopathy, neuropathy and  nephropathy.  Importance of compliance with diabetic medication.  Detail about diet plan and exercise would be helpful for controlling diabetes.  Discussed about monitoring blood sugar every day at home as well.  Patient has significantly worsening diabetes control mainly because of not taking medications for a long time.  Discussed that current hemoglobin A1c more than 15%, means he has significant hyperglycemia and which can cause hyperglycemia toxicity to beta cells of pancreas.  This will limit the effectiveness of antidiabetic medication other than insulin.  Once the hyperglycemia is better, we will gradually introduce other antidiabetic medications and they will be effective at that time.  Patient prefers not to be on injectable medicine including insulin.  With his current poorly controlled diabetes he would ideally needs basal and bolus insulin regimen.  With this discussion he agreed to take basal insulin once a day.  - Medications: See below.  I) start Lantus 25 units daily in the morning. II) increase metformin extended release from 1000 mg to 2000 mg daily. III) start glimepiride 4 mg daily.  With improvement of diabetes control will gradually add other antidiabetic medications, including likely Rybelsus and SGLT2 inhibitor, and then gradually taper and stop the insulin therapy.  - Home glucose testing:  Patient does not like to check fingerstick blood sugar at home.  Patient is asked to try CGM, advised to use extra tape/Tegaderm for the better adhesion, sent prescription for Dexcom G7 and freestyle libre 3+ to check cost and coverage with the pharmacy.  - Discussed/ Gave Hypoglycemia treatment plan.  # Consult : not required at this time.   # Annual urine for microalbuminuria/ creatinine ratio, no microalbuminuria currently.  Will recheck in the future visit once her diabetes is better controlled. Last  Lab Results  Component Value Date   MICRALBCREAT 0.7 12/24/2014    #  Foot check nightly.  Currently with mild numbness of the fingertips, probably hyperglycemia related and should be reversible after control of blood sugar.  # Annual dilated diabetic eye exams.  Advised to have diabetic eye exam.  - Diet: Make healthy diabetic food choices - Life style / activity / exercise: Discussed.  2. Blood pressure  -  BP Readings from Last 1 Encounters:  10/21/23 110/64    - Control is in target.  - No change in current plans.  3. Lipid status / Hyperlipidemia - Last  Lab Results  Component Value Date   LDLCALC 164 (H) 12/15/2021   -Will recheck in the future visit and address cholesterol problem, after improvement of diabetes control.  Diagnoses and all orders for this visit:  Type 2 diabetes mellitus with hyperglycemia, with long-term current use of insulin (HCC) -     POCT glycosylated hemoglobin (Hb A1C) -     insulin glargine (LANTUS SOLOSTAR) 100 UNIT/ML Solostar Pen; Inject 25 Units into the skin daily. -     Continuous Glucose Sensor (DEXCOM G7 SENSOR) MISC; 1 Device by Does not apply route continuous. Change every 10 days. -     Continuous Glucose Sensor (FREESTYLE LIBRE 3 PLUS SENSOR) MISC; 1 each by Does not apply route continuous. Change every 15 days.  Uncontrolled type 2 diabetes mellitus with hyperglycemia (HCC) -     POCT glycosylated hemoglobin (Hb A1C) -     insulin glargine (LANTUS SOLOSTAR) 100 UNIT/ML Solostar Pen; Inject 25 Units into the skin daily.  Type 2 diabetes mellitus without complication, with long-term current use of insulin (HCC) -     metFORMIN (GLUCOPHAGE-XR) 500 MG 24 hr tablet; Take 4 tablets (2,000 mg total) by mouth daily. -     glimepiride (AMARYL) 4 MG tablet; Take 1 tablet (4 mg total) by mouth daily before breakfast. -     Continuous Glucose Sensor (DEXCOM G7 SENSOR) MISC; 1 Device by Does not apply route continuous. Change every 10 days.    DISPOSITION Follow up in clinic in 6 weeks suggested.   All  questions answered and patient verbalized understanding of the plan.  Bryan Jeanet Lupe, MD [facility] [address] [place], [address] [place], [place] [address] Phone # [phone]  At least part of this note was generated using voice recognition software. Inadvertent word errors may have occurred, which were not recognized during the proofreading process.

## 2023-10-21 NOTE — Patient Instructions (Signed)
 Diabetes regimen  Start Lantus 25 units daily in the morning.  Increase metformin from 1000 mg to 2000 mg extended release daily.  Start glimepiride 4 mg daily.  I have sent Dexcom G7 CGM to the pharmacy to check cost and coverage.

## 2023-10-26 ENCOUNTER — Telehealth: Payer: Self-pay | Admitting: Pharmacy Technician

## 2023-10-26 ENCOUNTER — Other Ambulatory Visit (HOSPITAL_COMMUNITY): Payer: Self-pay

## 2023-10-26 NOTE — Telephone Encounter (Signed)
 Pharmacy Patient Advocate Encounter  Received notification from [facility] that Prior Authorization for Dexcom G7 Sensor has been APPROVED from 10/26/2023 to 10/25/2024. Ran test claim, Copay is $360.00. This test claim was processed through [facility]- copay amounts may vary at other pharmacies due to pharmacy/plan contracts, or as the patient moves through the different stages of their insurance plan.  **Patient has a deductible.**  PA #/Case ID/Reference #: [id number]

## 2023-10-26 NOTE — Telephone Encounter (Signed)
 Pharmacy Patient Advocate Encounter   Received notification from CoverMyMeds that prior authorization for Dexcom G7 Sensor is required/requested.   Insurance verification completed.   The patient is insured through [facility] .   Per test claim: PA required; PA submitted to above mentioned insurance via CoverMyMeds Key/confirmation #/EOC [id number] Status is pending

## 2023-10-27 MED ORDER — INSULIN PEN NEEDLE 32G X 4 MM MISC: 3 refills | Status: DC

## 2023-10-27 MED ORDER — INSULIN PEN NEEDLE 32G X 4 MM MISC: 3 refills | Status: AC

## 2023-10-27 NOTE — Addendum Note (Signed)
 Addended by: Pollie Meyer on: 10/27/2023 11:28 AM   Modules accepted: Orders

## 2023-11-05 ENCOUNTER — Emergency Department (HOSPITAL_COMMUNITY)

## 2023-11-05 ENCOUNTER — Other Ambulatory Visit: Payer: Self-pay

## 2023-11-05 ENCOUNTER — Encounter (HOSPITAL_COMMUNITY): Payer: Self-pay | Admitting: Emergency Medicine

## 2023-11-05 ENCOUNTER — Emergency Department (HOSPITAL_COMMUNITY)
Admission: EM | Admit: 2023-11-05 | Discharge: 2023-11-05 | Disposition: A | Discharge status: Home / Self Care | Attending: Emergency Medicine | Admitting: Emergency Medicine

## 2023-11-05 DIAGNOSIS — M25552 Pain in left hip: Secondary | ICD-10-CM | POA: Insufficient documentation

## 2023-11-05 DIAGNOSIS — Z7984 Long term (current) use of oral hypoglycemic drugs: Secondary | ICD-10-CM | POA: Diagnosis not present

## 2023-11-05 DIAGNOSIS — H6692 Otitis media, unspecified, left ear: Secondary | ICD-10-CM | POA: Insufficient documentation

## 2023-11-05 DIAGNOSIS — Z794 Long term (current) use of insulin: Secondary | ICD-10-CM | POA: Diagnosis not present

## 2023-11-05 DIAGNOSIS — E119 Type 2 diabetes mellitus without complications: Secondary | ICD-10-CM | POA: Diagnosis not present

## 2023-11-05 DIAGNOSIS — H6690 Otitis media, unspecified, unspecified ear: Secondary | ICD-10-CM

## 2023-11-05 DIAGNOSIS — M25572 Pain in left ankle and joints of left foot: Secondary | ICD-10-CM | POA: Diagnosis not present

## 2023-11-05 MED ORDER — NAPROXEN 500 MG PO TABS: 500.0000 mg | ORAL_TABLET | Freq: Two times a day (BID) | ORAL | 0 refills | Status: AC

## 2023-11-05 MED ORDER — PREDNISONE 20 MG PO TABS: 40.0000 mg | ORAL_TABLET | Freq: Every day | ORAL | 0 refills | Status: AC

## 2023-11-05 MED ORDER — PREDNISONE 20 MG PO TABS: 40.0000 mg | ORAL_TABLET | Freq: Every day | ORAL | 0 refills | Status: DC

## 2023-11-05 MED ORDER — AZITHROMYCIN 250 MG PO TABS: 250.0000 mg | ORAL_TABLET | Freq: Every day | ORAL | 0 refills | Status: DC

## 2023-11-05 MED ORDER — KETOROLAC TROMETHAMINE 15 MG/ML IJ SOLN
15.0000 mg | Freq: Once | INTRAMUSCULAR | Status: AC
  Administered 2023-11-05: 15 mg via INTRAMUSCULAR
  Filled 2023-11-05: qty 1

## 2023-11-05 MED ORDER — DEXAMETHASONE SODIUM PHOSPHATE 10 MG/ML IJ SOLN
10.0000 mg | Freq: Once | INTRAMUSCULAR | Status: AC
  Administered 2023-11-05: 10 mg via INTRAMUSCULAR
  Filled 2023-11-05: qty 1

## 2023-11-05 NOTE — ED Triage Notes (Signed)
 Pt in with L hip bone pain since waking on the morning of 4/4. Pt denies any trauma/injury, has had to use a cane to get around today due to pain. Pt states he has hx of recurrent PE's and DVT's and recently started back on anticoagulants

## 2023-11-05 NOTE — ED Provider Notes (Signed)
 [place] EMERGENCY DEPARTMENT AT [facility] Provider Note   CSN: [id number] Arrival date & time: 11/05/23  0004     History  Chief Complaint  Patient presents with   Hip Pain    Bryan Mcmillan is a 53 y.o. male with a history of diabetes mellitus, DVT, and seizure disorder presents the ED today for hip pain.  Patient reports that yesterday morning he woke up with point tenderness to the left hip.  States that pain is worse with movement and radiates across his thigh and ends at the medial left knee.  No injuries or trauma prior to onset of symptoms.  Denies any warmth to touch, swelling, or erythema of the left leg.  Has a history of blood clots in the past, states that this pain does not feel as it did with his prior DVTs.  Additionally, he reports pain to the left ear.  He thinks he might have an ear infection has been taking aspirin with some improved pain.  Pain has been going on for the past week without any drainage.  No fevers at home or decreased hearing.  No additional complaints or concerns at this time.    Home Medications Prior to Admission medications   Medication Sig Start Date End Date Taking? Authorizing Provider  azithromycin (ZITHROMAX) 250 MG tablet Take 1 tablet (250 mg total) by mouth daily. Take first 2 tablets together, then 1 every day until finished. 11/05/23  Yes Maxwell Marion, PA-C  naproxen (NAPROSYN) 500 MG tablet Take 1 tablet (500 mg total) by mouth 2 (two) times daily for 14 days. 11/05/23 11/19/23 Yes Maxwell Marion, PA-C  buPROPion (WELLBUTRIN XL) 150 MG 24 hr tablet Take 3 tablets (450 mg total) by mouth daily. 09/29/20 12/28/20  Pucilowski, Roosvelt Maser, MD  Continuous Glucose Sensor (DEXCOM G7 SENSOR) MISC 1 Device by Does not apply route continuous. Change every 10 days. 10/21/23   Thapa, [place], MD  Continuous Glucose Sensor (FREESTYLE LIBRE 3 PLUS SENSOR) MISC 1 each by Does not apply route continuous. Change every 15 days. 10/21/23   Thapa,  [place], MD  dapagliflozin propanediol (FARXIGA) 10 MG TABS tablet Take 1 tablet (10 mg total) by mouth daily before breakfast. Patient not taking: Reported on 10/21/2023 12/15/21   Carlus Pavlov, MD  enoxaparin (LOVENOX) 100 MG/ML injection Inject 1 mL (100 mg total) into the skin every 12 (twelve) hours. 09/23/23   Jaci Standard, MD  fluticasone (FLONASE) 50 MCG/ACT nasal spray Place 1 spray into both nostrils daily. Patient not taking: Reported on 10/21/2023 03/17/22   Achille Rich, PA-C  glimepiride (AMARYL) 4 MG tablet Take 1 tablet (4 mg total) by mouth daily before breakfast. 10/21/23   Thapa, [place], MD  hydrOXYzine (ATARAX/VISTARIL) 50 MG tablet Take 50 mg by mouth 2 (two) times daily. Patient not taking: Reported on 10/21/2023 11/27/20   [provider]  insulin glargine (LANTUS SOLOSTAR) 100 UNIT/ML Solostar Pen Inject 25 Units into the skin daily. 10/21/23   Thapa, [place], MD  Insulin Pen Needle 32G X 4 MM MISC Use 1x a day 10/27/23   Thapa, [place], MD  lamoTRIgine (LAMICTAL) 200 MG tablet Take 1 tablet (200 mg total) by mouth at bedtime. 09/29/20 12/28/20  Pucilowski, Roosvelt Maser, MD  metFORMIN (GLUCOPHAGE-XR) 500 MG 24 hr tablet Take 4 tablets (2,000 mg total) by mouth daily. 10/21/23   Thapa, [place], MD  prazosin (MINIPRESS) 5 MG capsule Take 1 capsule (5 mg total) by mouth at bedtime.  11/09/20 02/07/21  Pucilowski, Roosvelt Maser, MD  predniSONE (DELTASONE) 20 MG tablet Take 2 tablets (40 mg total) by mouth daily for 4 days. 11/06/23 11/10/23  Maxwell Marion, PA-C  repaglinide (PRANDIN) 2 MG tablet Take 1 tablet (2 mg total) by mouth 3 (three) times daily before meals. Patient not taking: Reported on 10/21/2023 03/19/22   Carlus Pavlov, MD  Semaglutide (RYBELSUS) 7 MG TABS Take 7 mg by mouth daily. Patient not taking: Reported on 10/21/2023 04/29/21   Romero Belling, MD  traZODone (DESYREL) 50 MG tablet TAKE 1 TABLET BY MOUTH AT BEDTIME AS NEEDED AND MAY REPEAT DOSE ONE TIME IF NEEDED FOR  SLEEP. Patient not taking: No sig reported 02/26/20   Pucilowski, Roosvelt Maser, MD      Allergies    Norco [hydrocodone-acetaminophen] and Penicillins    Review of Systems   Review of Systems  Musculoskeletal:  Positive for arthralgias.  All other systems reviewed and are negative.   Physical Exam Updated Vital Signs BP 111/78 (BP Location: Right Arm)   Pulse 81   Temp 98 F (36.7 C) (Oral)   Resp 16   Wt 100.9 kg   SpO2 98%   BMI 31.91 kg/m  Physical Exam Vitals and nursing note reviewed.  Constitutional:      General: He is not in acute distress.    Appearance: Normal appearance.  HENT:     Head: Normocephalic and atraumatic.     Right Ear: Tympanic membrane and ear canal normal.     Left Ear: Ear canal normal.     Ears:     Comments: Bulging and erythematous left TM.  Right TM is unremarkable.    Mouth/Throat:     Mouth: Mucous membranes are moist.  Eyes:     Conjunctiva/sclera: Conjunctivae normal.     Pupils: Pupils are equal, round, and reactive to light.  Cardiovascular:     Rate and Rhythm: Normal rate and regular rhythm.     Pulses: Normal pulses.     Heart sounds: Normal heart sounds.  Pulmonary:     Effort: Pulmonary effort is normal.     Breath sounds: Normal breath sounds.  Abdominal:     Palpations: Abdomen is soft.     Tenderness: There is no abdominal tenderness.  Musculoskeletal:        General: Tenderness present. Normal range of motion.     Cervical back: Normal range of motion. No tenderness.     Comments: There is to palpation of the left lateral hip.  Strength and sensation of upper and lower extremities intact.  Skin:    General: Skin is warm and dry.     Findings: No rash.  Neurological:     General: No focal deficit present.     Mental Status: He is alert.     Sensory: No sensory deficit.     Motor: No weakness.  Psychiatric:        Mood and Affect: Mood normal.        Behavior: Behavior normal.    ED Results / Procedures /  Treatments   Labs (all labs ordered are listed, but only abnormal results are displayed) Labs Reviewed - No data to display  EKG None  Radiology DG Hip Unilat W or Wo Pelvis 2-3 Views Left Result Date: 11/05/2023 CLINICAL DATA:  Left-sided hip pain EXAM: DG HIP (WITH OR WITHOUT PELVIS) 2-3V LEFT COMPARISON:  None Available. FINDINGS: SI joints are patent. Pubic symphysis and rami appear intact. No  fracture or malalignment. The joint space is patent. Mild irregularity at the ischial tuberosities, likely related to chronic change related to hamstrings insertion. IMPRESSION: No acute osseous abnormality. Electronically Signed   By: Jasmine Pang M.D.   On: 11/05/2023 01:10    Procedures Procedures: not indicated.   Medications Ordered in ED Medications  dexamethasone (DECADRON) injection 10 mg (10 mg Intramuscular Given 11/05/23 0518)  ketorolac (TORADOL) 15 MG/ML injection 15 mg (15 mg Intramuscular Given 11/05/23 0518)    ED Course/ Medical Decision Making/ A&P                                 Medical Decision Making Amount and/or Complexity of Data Reviewed Radiology: ordered.  Risk Prescription drug management.   This patient presents to the ED for concern of leg pain, this involves an extensive number of treatment options, and is a complaint that carries with it a high risk of complications and morbidity.   Differential diagnosis includes: Muscle strain, muscle spasm, sciatica, arthritic changes, fracture, dislocation, contusion, overuse injury, ligamentous injury, etc.   Comorbidities  See HPI above   Additional History  Additional history obtained from prior records.   Imaging Studies  I ordered imaging studies including left hip x-ray  I independently visualized and interpreted imaging which showed:  No acute osseous abnormalities. I agree with the radiologist interpretation   Problem List / ED Course / Critical Interventions / Medication Management  Reports  point tenderness to the left lateral hip that began yesterday.  States that pain is worse with movement and radiates across his thigh to the medial knee.  No pain, swelling, or redness to the calf.  No chest pain or shortness of breath. He has a history of low back pain in the past as well as previous back surgeries.  Possible radiculopathy as source of pain, possible muscle strain, or overuse injury. I ordered medications including: Decadron and Toradol for leg pain - medications given prior to discharge Discussed findings with patient.  All questions were answered. I have reviewed the patients home medicines and have made adjustments as needed   Social Determinants of Health  Physical activity   Test / Admission - Considered  Patient is stable and safe discharge home. Return precautions given.       Final Clinical Impression(s) / ED Diagnoses Final diagnoses:  Subacute otitis media, unspecified otitis media type  Left hip pain    Rx / DC Orders ED Discharge Orders          Ordered    predniSONE (DELTASONE) 20 MG tablet  Daily        11/05/23 0514    predniSONE (DELTASONE) 20 MG tablet  Daily,   Status:  Discontinued        11/05/23 0512    naproxen (NAPROSYN) 500 MG tablet  2 times daily        11/05/23 0512    azithromycin (ZITHROMAX) 250 MG tablet  Daily        11/05/23 0514              Maxwell Marion, PA-C 11/05/23 0553    Sabas Sous, MD 11/05/23 [phone]

## 2023-11-05 NOTE — Discharge Instructions (Addendum)
 As discussed, your imaging of your hip is reassuring.  There is no signs of fractures, or dislocations.  Take prednisone 40 mg for the next 4 days. Start your first dose on 4/6, as you were given a shot of steroids in the ED tonight.  Take naproxen twice a day for pain as well.  Additionally, I sent a prescription for azithromycin.  Take 500 mg the first day then 2 and a 50 mg for the following 4 days.  This antibiotic is for your ear infection.  I have provided information for orthopedics to follow-up with for your pain if it persists.  Get help right away if: You cannot control when you pee (urinate) or poop (have a bowel movement). You have weakness in any of these areas and it gets worse: Lower back. The area between your hip bones. Butt. Legs. You have redness or swelling of your back. You have a burning feeling when you pee.

## 2023-12-02 ENCOUNTER — Ambulatory Visit: Admitting: Endocrinology

## 2023-12-02 ENCOUNTER — Encounter: Payer: Self-pay | Admitting: Endocrinology

## 2023-12-02 DIAGNOSIS — Z794 Long term (current) use of insulin: Secondary | ICD-10-CM

## 2023-12-02 DIAGNOSIS — E1165 Type 2 diabetes mellitus with hyperglycemia: Secondary | ICD-10-CM

## 2023-12-02 DIAGNOSIS — E119 Type 2 diabetes mellitus without complications: Secondary | ICD-10-CM

## 2023-12-02 LAB — MICROALBUMIN / CREATININE URINE RATIO
Creatinine, Urine: 102 mg/dL (ref 20–320)
Microalb Creat Ratio: 7 mg/g{creat} (ref <30)
Microalb, Ur: 0.7 mg/dL

## 2023-12-02 MED ORDER — METFORMIN HCL ER 500 MG PO TB24
2000.0000 mg | ORAL_TABLET | Freq: Every day | ORAL | 3 refills | Status: DC
Start: 2023-12-02 — End: 2024-03-30

## 2023-12-02 MED ORDER — DEXCOM G7 SENSOR MISC: 1.0000 | 11 refills | Status: AC

## 2023-12-02 NOTE — Progress Notes (Signed)
 Outpatient Endocrinology Note Bryan Luciel Brickman, MD   Patient's Name: Bryan Mcmillan    DOB: 10-Jan-1971    MRN: [id number]                                                    REASON OF VISIT: Follow up for type 2 diabetes mellitus  PCP: Zilphia Hilt, Charyl Coppersmith, MD  HISTORY OF PRESENT ILLNESS:   Bryan Mcmillan is a 53 y.o. old male with past medical history listed below, is here for follow up for type 2 diabetes mellitus.   Pertinent Diabetes History: Patient was previously seen by Dr. Washington Hacker and Dr. Aldona Amel in the past, last time was seen by Dr. Aldona Amel in May 2023.  Patient reports at some point he was in [place]  and also did not have medical insurance, patient has  medical insurance now and presented , initial visit with me in October 21, 2023.  Patient was diagnosed with type 2 diabetes mellitus in 2012.  In the past he used to be on up to 4 oral antidiabetic medications.  He had taken insulin  therapy from 2012-2015.  He does not want to add another injection to Lovenox .  He prefers not to be on injectable antidiabetic medication.  History of DKA or diabetes related hospitalizations: none  Previous diabetes education: Yes   Family h/o diabetes mellitus: mother type 2 diabetes mellitus.    Patient used to have relatively reasonable control of diabetes until 2021, from March 2022 he is having worsening diabetic control with uncontrolled type 2 diabetes mellitus.  Hemoglobin A1c was 8.8% in May 2023.  He reports he was not taking any diabetic medications for about a year and restarted metformin  extended release 1000 mg daily about 1 month ago.  He used to be on Rybelsus  7 mg daily, repaglinide  2 mg 3 times a day, metformin  extended release 500 mg once a day, Farxiga  10 mg daily.  Hemoglobin A1c on October 21, 2023 was >15%, at initial visit and is started on basal insulin  including metformin  and glimepiride .  Chronic Diabetes Complications : Retinopathy: no. Last  ophthalmology exam was done on Due, following with ophthalmology regularly. About 2 years ago.  Nephropathy: no Peripheral neuropathy: no Coronary artery disease: no Stroke: no  Relevant comorbidities and cardiovascular risk factors: Obesity: yes Body mass index is 34.41 kg/m.  Hypertension: no Hyperlipidemia : Yes, not on statin   Current / Home Diabetic regimen includes:  Lantus  25 units daily. Metformin  XR 500 mg 4 tab daily. Glimepiride  4 mg daily.  Prior diabetic medications: In the past, glimepiride  change to repaglinide  due to hypoglycemia. Glipizide hypoglycemia.  He had edema with Actos .  Rybelsus  dose was decreased because of nausea and vomiting in the past.   Glycemic data:    CONTINUOUS GLUCOSE MONITORING SYSTEM (CGMS) INTERPRETATION:                     Dexcom G7 CGM-  Sensor Download (Sensor download was reviewed and summarized below.) Dates: April 19 - Dec 02, 2023, 14 days  Glucose Management Indicator: 9.0% Sensor Average: 236 SD 65 Sensor usage:94 %    Impression: - Most of the test postprandial hyperglycemia with blood sugar in the range of 300-350 range especially with supper and bedtime causing overnight hypoglycemia.  Hypoglycemia around breakfast and lunch  are within the range of 200-250.  Some of the time acceptable blood sugar in between the meals in the early morning and in the afternoon.  No hypoglycemia.  Hypoglycemia: Patient has no hypoglycemic episodes. Patient has hypoglycemia awareness.  Factors modifying glucose control: 1.  Diabetic diet assessment: 3 meals a day, more protein and vegetables.   2.  Staying active or exercising: none  3.  Medication compliance: compliant most of the time.  Interval history  Dexcom G7 CGM data as reviewed above.  Still with significant postprandial hyperglycemia especially with supper and bedtime with blood sugar up to 350 range.  Overall control improving from hemoglobin A1c more than 15% to 9% of GMI  on CGM.  Diabetes regimen as reviewed and noted above.  He is okay to stay on the basal insulin  for now.  He has occasional tingling on the feet otherwise no complaints today.  REVIEW OF SYSTEMS As per history of present illness.   PAST MEDICAL HISTORY: Past Medical History:  Diagnosis Date   Anxiety    Asthma    Depression    Diabetes (HCC)    DVT (deep venous thrombosis), hx of recurrent 05/23/2012   Headache(784.0)    frequent   History of blood clot in brain, 2012 - [facility] 05/23/2012   History of blood clots    History of suicidal tendencies    Hyperlipemia 08/25/2012   Hyperlipidemia    Kidney disease    Kidney stones    Migraines    Obesity    Pneumonia    Seizure (HCC)    Seizure disorder - followed by [facility] 05/23/2012   Sleep apnea    Tuberculosis     PAST SURGICAL HISTORY: Past Surgical History:  Procedure Laterality Date   BRAIN SURGERY     filter for blood clots     MECHANICAL THROMBECTOMY WITH AORTOGRAM AND INTERVENTION Left 11/28/2020   Procedure: MECHANICAL THROMBECTOMY OF LEFT COMMON ILIAC VEIN TO POPLITEAL VEIN;  Surgeon: Margherita Shell, MD;  Location: [facility] OR;  Service: Vascular;  Laterality: Left;   PERIPHERAL VASCULAR THROMBECTOMY N/A 11/25/2020   Procedure: PERIPHERAL VASCULAR THROMBECTOMY;  Surgeon: Margherita Shell, MD;  Location: [facility] INVASIVE CV LAB;  Service: Cardiovascular;  Laterality: N/A;   TOE AMPUTATION Right 2012   ULTRASOUND GUIDANCE FOR VASCULAR ACCESS Left 11/28/2020   Procedure: ULTRASOUND GUIDANCE FOR VASCULAR ACCESS OF LEFT POPLITEAL VEIN;  Surgeon: Margherita Shell, MD;  Location: [facility] OR;  Service: Vascular;  Laterality: Left;   VENOGRAM Left 11/28/2020   Procedure: VENOGRAM;  Surgeon: Margherita Shell, MD;  Location: [facility] OR;  Service: Vascular;  Laterality: Left;    ALLERGIES: Allergies  Allergen Reactions   Norco [Hydrocodone-Acetaminophen ] Other (See Comments)    Paralysis-everything but breathing    Penicillins Other (See Comments)    Did it involve swelling of the face/tongue/throat, SOB, or low BP? N/A Did it involve sudden or severe rash/hives, skin peeling, or any reaction on the inside of your mouth or nose? N/A Did you need to seek medical attention at a hospital or doctor's office? N/A When did it last happen? Child  If all above answers are "NO", may proceed with cephalosporin use.    FAMILY HISTORY:  Family History  Problem Relation Age of Onset   Hyperlipidemia Mother    Depression Mother    Heart disease Father    Stroke Other        parent   Diabetes Other  grandparent /parent   Obesity Other    Heart attack Other     SOCIAL HISTORY: Social History   Socioeconomic History   Marital status: Divorced    Spouse name: Not on file   Number of children: 1   Years of education: BA   Highest education level: Not on file  Occupational History   Occupation: CUST SERV    Employer: [employer]  Tobacco Use   Smoking status: Former   Smokeless tobacco: Never  Advertising account planner   Vaping status: Never Used  Substance and Sexual Activity   Alcohol use: No   Drug use: No   Sexual activity: Never  Other Topics Concern   Not on file  Social History Narrative   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[employer], Herbalist Situation: lives with sister and mother      Spiritual Beliefs:      Lifestyle: trying to walk and working on diet      Caffeine  Use: does consume         Social Drivers of Corporate investment banker Strain: Not on file  Food Insecurity: No Food Insecurity (05/08/2020)   Hunger Vital Sign    Worried About Running Out of Food in the Last Year: Never true    Ran Out of Food in the Last Year: Never true  Transportation Needs: Not on file  Physical Activity: Not on file  Stress: Not on file  Social Connections: Not on file    MEDICATIONS:  Current Outpatient Medications  Medication Sig Dispense Refill   azithromycin  (ZITHROMAX )  250 MG tablet Take 1 tablet (250 mg total) by mouth daily. Take first 2 tablets together, then 1 every day until finished. 6 tablet 0   dapagliflozin  propanediol (FARXIGA ) 10 MG TABS tablet Take 1 tablet (10 mg total) by mouth daily before breakfast. 90 tablet 3   enoxaparin  (LOVENOX ) 100 MG/ML injection Inject 1 mL (100 mg total) into the skin every 12 (twelve) hours. 60 mL 6   fluticasone  (FLONASE ) 50 MCG/ACT nasal spray Place 1 spray into both nostrils daily. 18.2 mL 1   glimepiride  (AMARYL ) 4 MG tablet Take 1 tablet (4 mg total) by mouth daily before breakfast. 30 tablet 3   hydrOXYzine  (ATARAX /VISTARIL ) 50 MG tablet Take 50 mg by mouth 2 (two) times daily.     insulin  glargine (LANTUS  SOLOSTAR) 100 UNIT/ML Solostar Pen Inject 25 Units into the skin daily. 15 mL 11   Insulin  Pen Needle 32G X 4 MM MISC Use 1x a day 100 each 3   repaglinide  (PRANDIN ) 2 MG tablet Take 1 tablet (2 mg total) by mouth 3 (three) times daily before meals. 270 tablet 4   traZODone  (DESYREL ) 50 MG tablet TAKE 1 TABLET BY MOUTH AT BEDTIME AS NEEDED AND MAY REPEAT DOSE ONE TIME IF NEEDED FOR SLEEP. 180 tablet 1   buPROPion  (WELLBUTRIN  XL) 150 MG 24 hr tablet Take 3 tablets (450 mg total) by mouth daily. 270 tablet 2   Continuous Glucose Sensor (DEXCOM G7 SENSOR) MISC 1 Device by Does not apply route continuous. Change every 10 days. 3 each 11   Continuous Glucose Sensor (FREESTYLE LIBRE 3 PLUS SENSOR) MISC 1 each by Does not apply route continuous. Change every 15 days. (Patient not taking: Reported on 12/02/2023) 6 each 3   lamoTRIgine  (LAMICTAL ) 200 MG tablet Take 1 tablet (200 mg total) by mouth at bedtime. 90 tablet 0   metFORMIN  (GLUCOPHAGE -XR)  500 MG 24 hr tablet Take 4 tablets (2,000 mg total) by mouth daily. 360 tablet 3   prazosin  (MINIPRESS ) 5 MG capsule Take 1 capsule (5 mg total) by mouth at bedtime. 90 capsule 0   Semaglutide  (RYBELSUS ) 7 MG TABS Take 7 mg by mouth daily. (Patient not taking: Reported on  09/23/2023) 90 tablet 3   No current facility-administered medications for this visit.    PHYSICAL EXAM: Vitals:   12/02/23 1036  BP: 110/70  Pulse: 87  Resp: 20  SpO2: 97%  Weight: 239 lb 12.8 oz (108.8 kg)  Height: 5\' 10"  (1.778 m)    Body mass index is 34.41 kg/m.  Wt Readings from Last 3 Encounters:  12/02/23 239 lb 12.8 oz (108.8 kg)  11/05/23 222 lb 6.4 oz (100.9 kg)  10/21/23 222 lb 6.4 oz (100.9 kg)    General: Well developed, well nourished male in no apparent distress.  HEENT: AT/[place], no external lesions.  Eyes: Conjunctiva clear and no icterus. Neck: Neck supple  Lungs: Respirations not labored Neurologic: Alert, oriented, normal speech Extremities / Skin: Dry.  Psychiatric: Does not appear depressed or anxious  Diabetic Foot Exam - Simple   Simple Foot Form Diabetic Foot exam was performed with the following findings: Yes 12/02/2023 10:53 AM  Visual Inspection See comments: Yes Sensation Testing Intact to touch and monofilament testing bilaterally: Yes Pulse Check Posterior Tibialis and Dorsalis pulse intact bilaterally: Yes Comments Right foot 1st, 2nd, 3rd toes amputation, traumatic. No ulcers.     LABS Reviewed Lab Results  Component Value Date   HGBA1C >15.0 10/21/2023   HGBA1C 8.8 (A) 12/15/2021   HGBA1C 7.5 (A) 04/29/2021   Lab Results  Component Value Date   FRUCTOSAMINE 482 (H) 10/15/2020   Lab Results  Component Value Date   CHOL 243 (H) 12/15/2021   HDL 49.80 12/15/2021   LDLCALC 164 (H) 12/15/2021   LDLDIRECT 171.6 05/23/2012   TRIG 145.0 12/15/2021   CHOLHDL 5 12/15/2021   Lab Results  Component Value Date   MICRALBCREAT 0.7 12/24/2014   MICRALBCREAT 1.0 12/19/2013   Lab Results  Component Value Date   CREATININE 0.92 09/23/2023   Lab Results  Component Value Date   GFR 101.85 10/15/2020    ASSESSMENT / PLAN  1. Type 2 diabetes mellitus without complication, with long-term current use of insulin  (HCC)   2. Type 2  diabetes mellitus with hyperglycemia, with long-term current use of insulin  (HCC)      Diabetes Mellitus type 2, complicated by no known complications. - Diabetic status / severity: Uncontrolled/poorly controlled.  Lab Results  Component Value Date   HGBA1C >15.0 10/21/2023    - Hemoglobin A1c goal : <6.5%  GMI on CGM 9%, overall diabetes control improving on the current medication.  He has significant hyperglycemia mainly with supper and bedtime, advised to limit carbohydrate and avoid large meal with portion control.  Patient generally prefers not to be on injectable medication however he is okay with being on basal insulin  for now.   - Medications: See below.  No change.  I) continue Lantus  25 units daily in the morning. II) continue metformin  extended release  2000 mg daily. III) continue glimepiride  4 mg daily.  With improvement of diabetes control will gradually add other antidiabetic medications, including likely Rybelsus  and SGLT2 inhibitor, and then gradually taper and stop the insulin  therapy.  - Home glucose testing: Dexcom G7 and check as needed.  - Discussed/ Gave Hypoglycemia treatment plan.  #  Consult : not required at this time.   # Annual urine for microalbuminuria/ creatinine ratio, no microalbuminuria currently.  Will check today.    Last  Lab Results  Component Value Date   MICRALBCREAT 0.7 12/24/2014    # Foot check nightly.  Currently with mild numbness of the fingertips, probably hyperglycemia related and should be reversible after control of blood sugar.  Traumatic amputation of 1st, 2nd and 3rd toes of right foot at age of 54 years.  # Annual dilated diabetic eye exams.  Advised to have diabetic eye exam.  - Diet: Make healthy diabetic food choices - Life style / activity / exercise: Discussed.  2. Blood pressure  -  BP Readings from Last 1 Encounters:  12/02/23 110/70    - Control is in target.  - No change in current plans.  3.  Lipid status / Hyperlipidemia - Last  Lab Results  Component Value Date   LDLCALC 164 (H) 12/15/2021   -Will recheck in the future visit and address cholesterol problem, after improvement of diabetes control.  Diagnoses and all orders for this visit:  Type 2 diabetes mellitus without complication, with long-term current use of insulin  (HCC) -     metFORMIN  (GLUCOPHAGE -XR) 500 MG 24 hr tablet; Take 4 tablets (2,000 mg total) by mouth daily. -     Continuous Glucose Sensor (DEXCOM G7 SENSOR) MISC; 1 Device by Does not apply route continuous. Change every 10 days. -     Microalbumin / creatinine urine ratio  Type 2 diabetes mellitus with hyperglycemia, with long-term current use of insulin  (HCC) -     Continuous Glucose Sensor (DEXCOM G7 SENSOR) MISC; 1 Device by Does not apply route continuous. Change every 10 days.    DISPOSITION Follow up in clinic in 3 months suggested.   All questions answered and patient verbalized understanding of the plan.  Bryan Deamber Buckhalter, MD [facility] Endocrinology [facility] [address] [place], [address] [place], [place] [address] Phone # [phone]  At least part of this note was generated using voice recognition software. Inadvertent word errors may have occurred, which were not recognized during the proofreading process.

## 2023-12-02 NOTE — Patient Instructions (Signed)
 Diabetes regimen  Lantus  25 units daily in the morning.  Metformin  XR 2000 mg extended release daily.  Glimepiride  4 mg daily.

## 2023-12-05 ENCOUNTER — Encounter: Payer: Self-pay | Admitting: Endocrinology

## 2024-02-22 ENCOUNTER — Other Ambulatory Visit: Payer: Self-pay | Admitting: Endocrinology

## 2024-02-22 DIAGNOSIS — Z794 Long term (current) use of insulin: Secondary | ICD-10-CM

## 2024-03-22 ENCOUNTER — Other Ambulatory Visit: Payer: Self-pay | Admitting: Hematology and Oncology

## 2024-03-22 DIAGNOSIS — I82402 Acute embolism and thrombosis of unspecified deep veins of left lower extremity: Secondary | ICD-10-CM
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
## 2024-03-23 ENCOUNTER — Inpatient Hospital Stay: Payer: BC Managed Care – PPO | Attending: Hematology and Oncology

## 2024-03-23 ENCOUNTER — Inpatient Hospital Stay: Payer: BC Managed Care – PPO | Admitting: Hematology and Oncology

## 2024-03-23 ENCOUNTER — Ambulatory Visit: Admitting: Endocrinology

## 2024-03-23 VITALS — BP 135/82 | HR 80 | Temp 97.8°F | Resp 14 | Wt 261.3 lb

## 2024-03-23 DIAGNOSIS — Z7901 Long term (current) use of anticoagulants: Secondary | ICD-10-CM | POA: Diagnosis not present

## 2024-03-23 DIAGNOSIS — Z87891 Personal history of nicotine dependence: Secondary | ICD-10-CM | POA: Diagnosis not present

## 2024-03-23 DIAGNOSIS — Z86718 Personal history of other venous thrombosis and embolism: Secondary | ICD-10-CM | POA: Insufficient documentation

## 2024-03-23 DIAGNOSIS — Z09 Encounter for follow-up examination after completed treatment for conditions other than malignant neoplasm: Secondary | ICD-10-CM | POA: Insufficient documentation

## 2024-03-23 DIAGNOSIS — D689 Coagulation defect, unspecified: Secondary | ICD-10-CM | POA: Diagnosis not present

## 2024-03-23 DIAGNOSIS — E1165 Type 2 diabetes mellitus with hyperglycemia: Secondary | ICD-10-CM | POA: Diagnosis not present

## 2024-03-23 DIAGNOSIS — I82402 Acute embolism and thrombosis of unspecified deep veins of left lower extremity: Secondary | ICD-10-CM

## 2024-03-23 LAB — CBC WITH DIFFERENTIAL (CANCER CENTER ONLY)
Abs Immature Granulocytes: 0.06 K/uL (ref 0.00–0.07)
Basophils Absolute: 0 K/uL (ref 0.0–0.1)
Basophils Relative: 0 %
Eosinophils Absolute: 0.1 K/uL (ref 0.0–0.5)
Eosinophils Relative: 1 %
HCT: 40.7 % (ref 39.0–52.0)
Hemoglobin: 14.5 g/dL (ref 13.0–17.0)
Immature Granulocytes: 1 %
Lymphocytes Relative: 36 %
Lymphs Abs: 2.5 K/uL (ref 0.7–4.0)
MCH: 29.8 pg (ref 26.0–34.0)
MCHC: 35.6 g/dL (ref 30.0–36.0)
MCV: 83.7 fL (ref 80.0–100.0)
Monocytes Absolute: 0.5 K/uL (ref 0.1–1.0)
Monocytes Relative: 7 %
Neutro Abs: 3.8 K/uL (ref 1.7–7.7)
Neutrophils Relative %: 55 %
Platelet Count: 310 K/uL (ref 150–400)
RBC: 4.86 MIL/uL (ref 4.22–5.81)
RDW: 11.9 % (ref 11.5–15.5)
WBC Count: 7 K/uL (ref 4.0–10.5)
nRBC: 0 % (ref 0.0–0.2)

## 2024-03-23 LAB — CMP (CANCER CENTER ONLY)
ALT: 22 U/L (ref 0–44)
AST: 16 U/L (ref 15–41)
Albumin: 4.2 g/dL (ref 3.5–5.0)
Alkaline Phosphatase: 52 U/L (ref 38–126)
Anion gap: 7 (ref 5–15)
BUN: 14 mg/dL (ref 6–20)
CO2: 25 mmol/L (ref 22–32)
Calcium: 9 mg/dL (ref 8.9–10.3)
Chloride: 106 mmol/L (ref 98–111)
Creatinine: 0.68 mg/dL (ref 0.61–1.24)
GFR, Estimated: >60 mL/min (ref >60)
Glucose, Bld: 202 mg/dL — ABNORMAL HIGH (ref 70–99)
Potassium: 4 mmol/L (ref 3.5–5.1)
Sodium: 138 mmol/L (ref 135–145)
Total Bilirubin: 0.3 mg/dL (ref 0.0–1.2)
Total Protein: 6.9 g/dL (ref 6.5–8.1)

## 2024-03-30 ENCOUNTER — Ambulatory Visit: Admitting: Endocrinology

## 2024-03-30 ENCOUNTER — Ambulatory Visit: Payer: Self-pay | Admitting: Endocrinology

## 2024-03-30 ENCOUNTER — Encounter: Payer: Self-pay | Admitting: Endocrinology

## 2024-03-30 VITALS — BP 116/80 | HR 76 | Resp 16 | Ht 70.0 in | Wt 259.4 lb

## 2024-03-30 DIAGNOSIS — Z794 Long term (current) use of insulin: Secondary | ICD-10-CM | POA: Diagnosis not present

## 2024-03-30 DIAGNOSIS — E1165 Type 2 diabetes mellitus with hyperglycemia: Secondary | ICD-10-CM | POA: Diagnosis not present

## 2024-03-30 DIAGNOSIS — E1169 Type 2 diabetes mellitus with other specified complication: Secondary | ICD-10-CM | POA: Diagnosis not present

## 2024-03-30 LAB — POCT GLYCOSYLATED HEMOGLOBIN (HGB A1C): Hemoglobin A1C: 8.8 % — AB (ref 4.0–5.6)

## 2024-03-30 MED ORDER — METFORMIN HCL ER 500 MG PO TB24: 2000.0000 mg | ORAL_TABLET | Freq: Every day | ORAL | 11 refills | Status: AC

## 2024-03-30 MED ORDER — GLIMEPIRIDE 4 MG PO TABS: 4.0000 mg | ORAL_TABLET | Freq: Every day | ORAL | 11 refills | Status: DC

## 2024-03-30 MED ORDER — TIRZEPATIDE 5 MG/0.5ML ~~LOC~~ SOAJ: 5.0000 mg | SUBCUTANEOUS | 11 refills | Status: DC

## 2024-03-30 MED ORDER — TIRZEPATIDE 2.5 MG/0.5ML ~~LOC~~ SOAJ: 2.5000 mg | SUBCUTANEOUS | 0 refills | Status: DC

## 2024-03-30 MED ORDER — LANTUS SOLOSTAR 100 UNIT/ML ~~LOC~~ SOPN: 35.0000 [IU] | PEN_INJECTOR | Freq: Every day | SUBCUTANEOUS | 11 refills | Status: AC

## 2024-03-30 NOTE — Progress Notes (Signed)
 Outpatient Endocrinology Note Bryan Lynel Forester, MD   Patient's Name: Bryan Mcmillan    DOB: 30-Aug-1970    MRN: [id number]                                                    REASON OF VISIT: Follow up for type 2 diabetes mellitus  PCP: Theophilus Andrews, Tully GRADE, MD  HISTORY OF PRESENT ILLNESS:   Bryan Mcmillan is a 53 y.o. old male with past medical history listed below, is here for follow up for type 2 diabetes mellitus.   Pertinent Diabetes History: Patient was previously seen by Dr. Kassie and Dr. Trixie in the past, last time was seen by Dr. Trixie in May 2023.  Patient reports at some point he was in [place]  and also did not have medical insurance, patient has  medical insurance now and presented , initial visit with me in October 21, 2023.  Patient was diagnosed with type 2 diabetes mellitus in 2012.  In the past he used to be on up to 4 oral antidiabetic medications.  He had taken insulin  therapy from 2012-2015.  He does not want to add another injection to Lovenox .  He prefers not to be on injectable antidiabetic medication.  History of DKA or diabetes related hospitalizations: none  Previous diabetes education: Yes   Family h/o diabetes mellitus: mother type 2 diabetes mellitus.    Patient used to have relatively reasonable control of diabetes until 2021, from March 2022 he is having worsening diabetic control with uncontrolled type 2 diabetes mellitus.  Hemoglobin A1c was 8.8% in May 2023.  He reports he was not taking any diabetic medications for about a year and restarted metformin  extended release 1000 mg daily about 1 month ago.  He used to be on Rybelsus  7 mg daily, repaglinide  2 mg 3 times a day, metformin  extended release 500 mg once a day, Farxiga  10 mg daily.  Hemoglobin A1c on October 21, 2023 was >15%, at initial visit and is started on basal insulin  including metformin  and glimepiride .  Chronic Diabetes Complications : Retinopathy: no. Last  ophthalmology exam was done on Due, following with ophthalmology regularly. About 2 years ago.  Nephropathy: no Peripheral neuropathy: no Coronary artery disease: no Stroke: no  Relevant comorbidities and cardiovascular risk factors: Obesity: yes Body mass index is 37.22 kg/m.  Hypertension: no Hyperlipidemia : Yes, not on statin   Current / Home Diabetic regimen includes:  Lantus  25 units daily. Metformin  XR 500 mg 4 tab daily. Glimepiride  4 mg daily.  Prior diabetic medications: In the past, glimepiride  change to repaglinide  due to hypoglycemia. Glipizide hypoglycemia.  He had edema with Actos .  Rybelsus  dose was decreased because of nausea and vomiting in the past.  Glycemic data:    CONTINUOUS GLUCOSE MONITORING SYSTEM (CGMS) INTERPRETATION:                     Dexcom G7 CGM-  Sensor Download (Sensor download was reviewed and summarized below.) Dates: August 16 to March 30, 2024, 14 days  Glucose Management Indicator: 8.8% Sensor Average: 229 SD 60 Sensor usage: 82%     Previous:    Impression: Mostly significant hyperglycemia specially with supper and is staying hyperglycemia overnight.  Most of the time blood sugar trending down by the morning.  Hyperglycemia in  the range of 300-350 and by the morning blood sugar came down to 180s range.  Intermittent hyperglycemia with blood sugar up to 250s during daytime with breakfast and sometimes with lunch.  Late afternoon mostly acceptable blood sugar.  No hypoglycemia.  Hypoglycemia: Patient has no hypoglycemic episodes. Patient has hypoglycemia awareness.  Factors modifying glucose control: 1.  Diabetic diet assessment: 3 meals a day, more protein and vegetables.   2.  Staying active or exercising: none  3.  Medication compliance: compliant most of the time.  Interval history  Diabetes regimen as reviewed above.  Still with hyperglycemia.  Hemoglobin A1c improved to 8.8% from more than 15%.  Patient reports he  has significantly stress lately.  He also gained about 20 pounds of weight in last 4 months.  Reports compliance with diabetic medication including insulin .  No other complaints today.  REVIEW OF SYSTEMS As per history of present illness.   PAST MEDICAL HISTORY: Past Medical History:  Diagnosis Date   Anxiety    Asthma    Depression    Diabetes (HCC)    DVT (deep venous thrombosis), hx of recurrent 05/23/2012   Headache(784.0)    frequent   History of blood clot in brain, 2012 - followed by [facility] 05/23/2012   History of blood clots    History of suicidal tendencies    Hyperlipemia 08/25/2012   Hyperlipidemia    Kidney disease    Kidney stones    Migraines    Obesity    Pneumonia    Seizure (HCC)    Seizure disorder - followed by [facility] 05/23/2012   Sleep apnea    Tuberculosis     PAST SURGICAL HISTORY: Past Surgical History:  Procedure Laterality Date   BRAIN SURGERY     filter for blood clots     MECHANICAL THROMBECTOMY WITH AORTOGRAM AND INTERVENTION Left 11/28/2020   Procedure: MECHANICAL THROMBECTOMY OF LEFT COMMON ILIAC VEIN TO POPLITEAL VEIN;  Surgeon: Serene Gaile ORN, MD;  Location: [facility] OR;  Service: Vascular;  Laterality: Left;   PERIPHERAL VASCULAR THROMBECTOMY N/A 11/25/2020   Procedure: PERIPHERAL VASCULAR THROMBECTOMY;  Surgeon: Serene Gaile ORN, MD;  Location: [facility] INVASIVE CV LAB;  Service: Cardiovascular;  Laterality: N/A;   TOE AMPUTATION Right 2012   ULTRASOUND GUIDANCE FOR VASCULAR ACCESS Left 11/28/2020   Procedure: ULTRASOUND GUIDANCE FOR VASCULAR ACCESS OF LEFT POPLITEAL VEIN;  Surgeon: Serene Gaile ORN, MD;  Location: [facility] OR;  Service: Vascular;  Laterality: Left;   VENOGRAM Left 11/28/2020   Procedure: VENOGRAM;  Surgeon: Serene Gaile ORN, MD;  Location: [facility] OR;  Service: Vascular;  Laterality: Left;    ALLERGIES: Allergies  Allergen Reactions   Norco [Hydrocodone-Acetaminophen ] Other (See Comments)    Paralysis-everything but  breathing   Penicillins Other (See Comments)    Did it involve swelling of the face/tongue/throat, SOB, or low BP? N/A Did it involve sudden or severe rash/hives, skin peeling, or any reaction on the inside of your mouth or nose? N/A Did you need to seek medical attention at a hospital or doctor's office? N/A When did it last happen? Child  If all above answers are "NO", may proceed with cephalosporin use.    FAMILY HISTORY:  Family History  Problem Relation Age of Onset   Hyperlipidemia Mother    Depression Mother    Heart disease Father    Stroke Other        parent   Diabetes Other  grandparent /parent   Obesity Other    Heart attack Other     SOCIAL HISTORY: Social History   Socioeconomic History   Marital status: Divorced    Spouse name: Not on file   Number of children: 1   Years of education: BA   Highest education level: Not on file  Occupational History   Occupation: CUST SERV    Employer: [employer]  Tobacco Use   Smoking status: Former   Smokeless tobacco: Never  Advertising account planner   Vaping status: Never Used  Substance and Sexual Activity   Alcohol use: No   Drug use: No   Sexual activity: Never  Other Topics Concern   Not on file  Social History Narrative   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[employer], Herbalist Situation: lives with sister and mother      Spiritual Beliefs:      Lifestyle: trying to walk and working on diet      Caffeine  Use: does consume         Social Drivers of Corporate investment banker Strain: Not on file  Food Insecurity: No Food Insecurity (05/08/2020)   Hunger Vital Sign    Worried About Running Out of Food in the Last Year: Never true    Ran Out of Food in the Last Year: Never true  Transportation Needs: Not on file  Physical Activity: Not on file  Stress: Not on file  Social Connections: Not on file    MEDICATIONS:  Current Outpatient Medications  Medication Sig Dispense Refill   Continuous  Glucose Sensor (DEXCOM G7 SENSOR) MISC 1 Device by Does not apply route continuous. Change every 10 days. 3 each 11   enoxaparin  (LOVENOX ) 100 MG/ML injection Inject 1 mL (100 mg total) into the skin every 12 (twelve) hours. 60 mL 6   Insulin  Pen Needle 32G X 4 MM MISC Use 1x a day 100 each 3   tirzepatide  (MOUNJARO ) 2.5 MG/0.5ML Pen Inject 2.5 mg into the skin once a week. FOR 4 WEEKS. 2 mL 0   tirzepatide  (MOUNJARO ) 5 MG/0.5ML Pen Inject 5 mg into the skin once a week. AFTER 4 WEEKS OF 2.5MG  DOSE. 2 mL 11   glimepiride  (AMARYL ) 4 MG tablet Take 1 tablet (4 mg total) by mouth daily before breakfast. 30 tablet 11   insulin  glargine (LANTUS  SOLOSTAR) 100 UNIT/ML Solostar Pen Inject 35 Units into the skin daily. 15 mL 11   metFORMIN  (GLUCOPHAGE -XR) 500 MG 24 hr tablet Take 4 tablets (2,000 mg total) by mouth daily. 90 tablet 11   No current facility-administered medications for this visit.    PHYSICAL EXAM: Vitals:   03/30/24 1009  BP: 116/80  Pulse: 76  Resp: 16  SpO2: 97%  Weight: 259 lb 6.4 oz (117.7 kg)  Height: 5' 10 (1.778 m)    Body mass index is 37.22 kg/m.  Wt Readings from Last 3 Encounters:  03/30/24 259 lb 6.4 oz (117.7 kg)  03/23/24 261 lb 4.8 oz (118.5 kg)  12/02/23 239 lb 12.8 oz (108.8 kg)    General: Well developed, well nourished male in no apparent distress.  HEENT: AT/[place], no external lesions.  Eyes: Conjunctiva clear and no icterus. Neck: Neck supple  Lungs: Respirations not labored Neurologic: Alert, oriented, normal speech Extremities / Skin: Dry.  Psychiatric: Does not appear depressed or anxious  Diabetic Foot Exam - Simple   No data filed    LABS Reviewed  Lab Results  Component Value Date   HGBA1C 8.8 (A) 03/30/2024   HGBA1C >15.0 10/21/2023   HGBA1C 8.8 (A) 12/15/2021   Lab Results  Component Value Date   FRUCTOSAMINE 482 (H) 10/15/2020   Lab Results  Component Value Date   CHOL 243 (H) 12/15/2021   HDL 49.80 12/15/2021   LDLCALC 164  (H) 12/15/2021   LDLDIRECT 171.6 05/23/2012   TRIG 145.0 12/15/2021   CHOLHDL 5 12/15/2021   Lab Results  Component Value Date   MICRALBCREAT 7 12/02/2023   Lab Results  Component Value Date   CREATININE 0.68 03/23/2024   Lab Results  Component Value Date   GFR 101.85 10/15/2020    ASSESSMENT / PLAN  1. Uncontrolled type 2 diabetes mellitus with hyperglycemia (HCC)   2. Type 2 diabetes mellitus with hyperglycemia, with long-term current use of insulin  (HCC)   3. Type 2 diabetes mellitus with other specified complication, with long-term current use of insulin  (HCC)     Diabetes Mellitus type 2, complicated by no other known complications. - Diabetic status / severity: Uncontrolled.  Improving.  Lab Results  Component Value Date   HGBA1C 8.8 (A) 03/30/2024   - Hemoglobin A1c goal : <6.5%  Still with significant hyperglycemia.  Discussed about limiting carbohydrate and portion control especially with dinner.   Patient generally prefers not to be on injectable medication however he is okay with being on basal insulin  for now.  No plan for mealtime insulin  at this time.  He is okay with basal insulin  and GLP-1 receptor agonist injection.  No known contraindication for GLP-1 receptor agonist.  No personal history of pancreatitis.  No family history of medullary thyroid  cancer or MEN 2 syndrome.  - Medications: See below.    I) increase Lantus  from 25 to 35 units daily in the morning. II) continue metformin  extended release  2000 mg daily. III) continue glimepiride  4 mg daily. IV) start Mounjaro  2.5 mg weekly for 4 weeks and increase to 5 mg weekly.  - Home glucose testing: Dexcom G7 and check as needed.  - Discussed/ Gave Hypoglycemia treatment plan.  # Consult : not required at this time.   # Annual urine for microalbuminuria/ creatinine ratio, no microalbuminuria currently.  Last  Lab Results  Component Value Date   MICRALBCREAT 7 12/02/2023    # Foot check  nightly.  Traumatic amputation of 1st, 2nd and 3rd toes of right foot at age of 56 years.  # Annual dilated diabetic eye exams.  Advised to have diabetic eye exam.  He is due for it.  - Diet: Make healthy diabetic food choices - Life style / activity / exercise: Discussed.  2. Blood pressure  -  BP Readings from Last 1 Encounters:  03/30/24 116/80    - Control is in target.  - No change in current plans.  3. Lipid status / Hyperlipidemia - Last  Lab Results  Component Value Date   LDLCALC 164 (H) 12/15/2021   -Will recheck in the future visit and address cholesterol problem, after improvement of diabetes control.  Diagnoses and all orders for this visit:  Uncontrolled type 2 diabetes mellitus with hyperglycemia (HCC) -     tirzepatide  (MOUNJARO ) 2.5 MG/0.5ML Pen; Inject 2.5 mg into the skin once a week. FOR 4 WEEKS. -     tirzepatide  (MOUNJARO ) 5 MG/0.5ML Pen; Inject 5 mg into the skin once a week. AFTER 4 WEEKS OF 2.5MG  DOSE. -     insulin  glargine (LANTUS   SOLOSTAR) 100 UNIT/ML Solostar Pen; Inject 35 Units into the skin daily. -     POCT glycosylated hemoglobin (Hb A1C)  Type 2 diabetes mellitus with hyperglycemia, with long-term current use of insulin  (HCC) -     insulin  glargine (LANTUS  SOLOSTAR) 100 UNIT/ML Solostar Pen; Inject 35 Units into the skin daily.  Type 2 diabetes mellitus with other specified complication, with long-term current use of insulin  (HCC) -     glimepiride  (AMARYL ) 4 MG tablet; Take 1 tablet (4 mg total) by mouth daily before breakfast. -     metFORMIN  (GLUCOPHAGE -XR) 500 MG 24 hr tablet; Take 4 tablets (2,000 mg total) by mouth daily.   DISPOSITION Follow up in clinic in 3 months suggested.   All questions answered and patient verbalized understanding of the plan.  Bryan Elaisha Zahniser, MD [facility] Endocrinology [facility] [address] [place], [address] [place], [place] [address] Phone # [phone]  At least part of this note was  generated using voice recognition software. Inadvertent word errors may have occurred, which were not recognized during the proofreading process.

## 2024-03-30 NOTE — Patient Instructions (Signed)
 Diabetes regimen  Lantus  35 units daily in the morning.  Metformin  XR 2000 mg extended release daily.  Glimepiride  4 mg daily.  Start Mounjaro  2.5mg  weekly for 4 weeks and increase to 5mg  weekly.

## 2024-05-04 ENCOUNTER — Other Ambulatory Visit: Payer: Self-pay | Admitting: Endocrinology

## 2024-05-04 DIAGNOSIS — E1165 Type 2 diabetes mellitus with hyperglycemia: Secondary | ICD-10-CM

## 2024-06-18 ENCOUNTER — Other Ambulatory Visit: Payer: Self-pay | Admitting: Endocrinology

## 2024-06-18 DIAGNOSIS — E1169 Type 2 diabetes mellitus with other specified complication: Secondary | ICD-10-CM

## 2024-06-18 NOTE — Telephone Encounter (Signed)
 Refill request complete

## 2024-06-24 ENCOUNTER — Other Ambulatory Visit: Payer: Self-pay

## 2024-06-24 ENCOUNTER — Emergency Department (HOSPITAL_BASED_OUTPATIENT_CLINIC_OR_DEPARTMENT_OTHER)
Admission: EM | Admit: 2024-06-24 | Discharge: 2024-06-24 | Disposition: A | Discharge status: Home / Self Care | Attending: Emergency Medicine | Admitting: Emergency Medicine

## 2024-06-24 ENCOUNTER — Emergency Department (HOSPITAL_BASED_OUTPATIENT_CLINIC_OR_DEPARTMENT_OTHER)

## 2024-06-24 ENCOUNTER — Encounter (HOSPITAL_BASED_OUTPATIENT_CLINIC_OR_DEPARTMENT_OTHER): Payer: Self-pay | Admitting: Emergency Medicine

## 2024-06-24 ENCOUNTER — Emergency Department (HOSPITAL_BASED_OUTPATIENT_CLINIC_OR_DEPARTMENT_OTHER): Admitting: Radiology

## 2024-06-24 DIAGNOSIS — R0602 Shortness of breath: Secondary | ICD-10-CM | POA: Insufficient documentation

## 2024-06-24 DIAGNOSIS — Z0389 Encounter for observation for other suspected diseases and conditions ruled out: Secondary | ICD-10-CM | POA: Diagnosis not present

## 2024-06-24 DIAGNOSIS — R0789 Other chest pain: Secondary | ICD-10-CM | POA: Diagnosis not present

## 2024-06-24 LAB — CBC
HCT: 42.1 % (ref 39.0–52.0)
Hemoglobin: 14.3 g/dL (ref 13.0–17.0)
MCH: 29.4 pg (ref 26.0–34.0)
MCHC: 34 g/dL (ref 30.0–36.0)
MCV: 86.6 fL (ref 80.0–100.0)
Platelets: 355 K/uL (ref 150–400)
RBC: 4.86 MIL/uL (ref 4.22–5.81)
RDW: 12.2 % (ref 11.5–15.5)
WBC: 11.1 K/uL — ABNORMAL HIGH (ref 4.0–10.5)
nRBC: 0 % (ref 0.0–0.2)

## 2024-06-24 LAB — BASIC METABOLIC PANEL WITH GFR
Anion gap: 12 (ref 5–15)
BUN: 19 mg/dL (ref 6–20)
CO2: 26 mmol/L (ref 22–32)
Calcium: 9.8 mg/dL (ref 8.9–10.3)
Chloride: 101 mmol/L (ref 98–111)
Creatinine, Ser: 0.84 mg/dL (ref 0.61–1.24)
GFR, Estimated: >60 mL/min (ref >60)
Glucose, Bld: 171 mg/dL — ABNORMAL HIGH (ref 70–99)
Potassium: 4.1 mmol/L (ref 3.5–5.1)
Sodium: 139 mmol/L (ref 135–145)

## 2024-06-24 LAB — TROPONIN T, HIGH SENSITIVITY: Troponin T High Sensitivity: <15 ng/L (ref 0–19)

## 2024-06-24 MED ORDER — IOHEXOL 350 MG/ML SOLN
75.0000 mL | Freq: Once | INTRAVENOUS | Status: AC | PRN
  Administered 2024-06-24: 75 mL via INTRAVENOUS

## 2024-06-24 NOTE — ED Provider Notes (Signed)
 [place] EMERGENCY DEPARTMENT AT [facility]  Provider Note  CSN: [id number] Arrival date & time: 06/24/24 0015  History Chief Complaint  Patient presents with   Chest Pain   Shortness of Breath    Bryan Mcmillan is a 53 y.o. male with history of recurrent VTE, has been on several different oral anticoagulants and is currently on BID lovenox . He states when he takes his lovenox  he can 'feel the clots move into his lungs and heart and then dissolve'. He has had sharp, anterior chest pains and mild SOB for about 2 weeks that feels different from the usual sensation he experiences and he wanted to make sure it wasn't something more serious. He reports compliance with his medications.    Home Medications Prior to Admission medications   Medication Sig Start Date End Date Taking? Authorizing Provider  Continuous Glucose Sensor (DEXCOM G7 SENSOR) MISC 1 Device by Does not apply route continuous. Change every 10 days. 12/02/23   Thapa, Sudan, MD  enoxaparin  (LOVENOX ) 100 MG/ML injection Inject 1 mL (100 mg total) into the skin every 12 (twelve) hours. 09/23/23   Dorsey, John T IV, MD  glimepiride  (AMARYL ) 4 MG tablet TAKE 1 TABLET BY MOUTH DAILY BEFORE BREAKFAST 06/18/24   Thapa, Sudan, MD  insulin  glargine (LANTUS  SOLOSTAR) 100 UNIT/ML Solostar Pen Inject 35 Units into the skin daily. 03/30/24   Thapa, Sudan, MD  Insulin  Pen Needle 32G X 4 MM MISC Use 1x a day 10/27/23   Thapa, Sudan, MD  metFORMIN  (GLUCOPHAGE -XR) 500 MG 24 hr tablet Take 4 tablets (2,000 mg total) by mouth daily. 03/30/24   Thapa, Sudan, MD  tirzepatide  (MOUNJARO ) 2.5 MG/0.5ML Pen Inject 2.5 mg into the skin once a week. FOR 4 WEEKS. 03/30/24   Thapa, Sudan, MD  tirzepatide  (MOUNJARO ) 5 MG/0.5ML Pen Inject 5 mg into the skin once a week. AFTER 4 WEEKS OF 2.5MG  DOSE. 03/30/24   Thapa, Sudan, MD     Allergies    Norco [hydrocodone-acetaminophen ] and Penicillins   Review of Systems   Review of  Systems Please see HPI for pertinent positives and negatives  Physical Exam BP 135/78   Pulse 88   Temp 98.1 F (36.7 C) (Oral)   Resp 17   Ht 5' 10 (1.778 m)   Wt 116.1 kg   SpO2 94%   BMI 36.73 kg/m   Physical Exam Vitals and nursing note reviewed.  Constitutional:      Appearance: Normal appearance.  HENT:     Head: Normocephalic and atraumatic.     Nose: Nose normal.     Mouth/Throat:     Mouth: Mucous membranes are moist.  Eyes:     Extraocular Movements: Extraocular movements intact.     Conjunctiva/sclera: Conjunctivae normal.  Cardiovascular:     Rate and Rhythm: Normal rate.  Pulmonary:     Effort: Pulmonary effort is normal.     Breath sounds: Normal breath sounds.  Abdominal:     General: Abdomen is flat.     Palpations: Abdomen is soft.     Tenderness: There is no abdominal tenderness.  Musculoskeletal:        General: No swelling. Normal range of motion.     Cervical back: Neck supple.  Skin:    General: Skin is warm and dry.  Neurological:     General: No focal deficit present.     Mental Status: He is alert.  Psychiatric:        Mood  and Affect: Mood normal.     ED Results / Procedures / Treatments   EKG EKG Interpretation Date/Time:  Sunday June 24 2024 00:22:17 EST Ventricular Rate:  89 PR Interval:  165 QRS Duration:  87 QT Interval:  359 QTC Calculation: 437 R Axis:   -16  Text Interpretation: Sinus rhythm Borderline left axis deviation Low voltage, extremity leads Consider anterior infarct No significant change since last tracing Confirmed by Roselyn Dunnings [phone]) on 06/24/2024 12:25:20 AM  Procedures Procedures  Medications Ordered in the ED Medications  iohexol  (OMNIPAQUE ) 350 MG/ML injection 75 mL (75 mLs Intravenous Contrast Given 06/24/24 0108)    Initial Impression and Plan  Patient with history of recurrent DVT/PE here with nonspecific CP, SOB. He also has DM and HLD, but no known CAD. Will check labs and send  for CTA. Overall he is well appearing with reassuring vitals and exam.   ED Course   Clinical Course as of 06/24/24 0134  Sun Jun 24, 2024  0034 CBC is unremarkable.  [CS]  0053 BMP and trop are unremarkable.  [CS]  0123 I personally viewed the images from radiology studies and agree with radiologist interpretation: CTA neg for PE or other acute process. Given duration of symptoms, atypical nature do not need a repeat Trop to rule out AMI. Patient reassured no emergent cause of his symptoms found tonight. Recommend he continue his medications and PCP follow up, RTED for any other concerns.   [CS]    Clinical Course User Index [CS] Roselyn Dunnings NOVAK, MD     MDM Rules/Calculators/A&P Medical Decision Making Given presenting complaint, I considered that admission might be necessary. After review of results from ED lab and/or imaging studies, admission to the hospital is not indicated at this time.    Problems Addressed: Atypical chest pain: acute illness or injury  Amount and/or Complexity of Data Reviewed Labs: ordered. Decision-making details documented in ED Course. Radiology: ordered and independent interpretation performed. Decision-making details documented in ED Course. ECG/medicine tests: ordered and independent interpretation performed. Decision-making details documented in ED Course.  Risk Prescription drug management. Decision regarding hospitalization.     Final Clinical Impression(s) / ED Diagnoses Final diagnoses:  Atypical chest pain    Rx / DC Orders ED Discharge Orders     None        Roselyn Dunnings NOVAK, MD 06/24/24 [phone]

## 2024-06-24 NOTE — ED Triage Notes (Signed)
  Patient comes in with mid chest pain that has been intermittent for the last two weeks.  Patient states he started having SOB earlier this evening before going to bed.  Patient has hx of blood clots and takes lovenox .  Denies any N/V, diaphoresis, or syncope tonight.  States pain radiates to back.  Pain 7/10, pressure.  Denies any cardiac hx.

## 2024-07-06 ENCOUNTER — Ambulatory Visit: Admitting: Endocrinology

## 2024-07-31 ENCOUNTER — Ambulatory Visit (INDEPENDENT_AMBULATORY_CARE_PROVIDER_SITE_OTHER): Admitting: Family Medicine

## 2024-07-31 ENCOUNTER — Other Ambulatory Visit (HOSPITAL_COMMUNITY): Payer: Self-pay

## 2024-07-31 ENCOUNTER — Telehealth: Payer: Self-pay | Admitting: Pharmacy Technician

## 2024-07-31 ENCOUNTER — Ambulatory Visit: Admitting: Family Medicine

## 2024-07-31 ENCOUNTER — Encounter: Payer: Self-pay | Admitting: Family Medicine

## 2024-07-31 VITALS — BP 128/78 | HR 74 | Temp 97.6°F | Ht 70.0 in | Wt 261.8 lb

## 2024-07-31 DIAGNOSIS — Z6837 Body mass index (BMI) 37.0-37.9, adult: Secondary | ICD-10-CM | POA: Diagnosis not present

## 2024-07-31 DIAGNOSIS — E785 Hyperlipidemia, unspecified: Secondary | ICD-10-CM | POA: Diagnosis not present

## 2024-07-31 DIAGNOSIS — E66812 Obesity, class 2: Secondary | ICD-10-CM

## 2024-07-31 DIAGNOSIS — E1169 Type 2 diabetes mellitus with other specified complication: Secondary | ICD-10-CM

## 2024-07-31 DIAGNOSIS — Z79899 Other long term (current) drug therapy: Secondary | ICD-10-CM

## 2024-07-31 DIAGNOSIS — Z86718 Personal history of other venous thrombosis and embolism: Secondary | ICD-10-CM | POA: Diagnosis not present

## 2024-07-31 DIAGNOSIS — E1165 Type 2 diabetes mellitus with hyperglycemia: Secondary | ICD-10-CM

## 2024-07-31 DIAGNOSIS — J069 Acute upper respiratory infection, unspecified: Secondary | ICD-10-CM

## 2024-07-31 LAB — COMPREHENSIVE METABOLIC PANEL WITH GFR
ALT: 19 U/L (ref 3–53)
AST: 14 U/L (ref 5–37)
Albumin: 4.4 g/dL (ref 3.5–5.2)
Alkaline Phosphatase: 46 U/L (ref 39–117)
BUN: 17 mg/dL (ref 6–23)
CO2: 27 meq/L (ref 19–32)
Calcium: 9.6 mg/dL (ref 8.4–10.5)
Chloride: 103 meq/L (ref 96–112)
Creatinine, Ser: 0.8 mg/dL (ref 0.40–1.50)
GFR: 101.36 mL/min
Glucose, Bld: 162 mg/dL — ABNORMAL HIGH (ref 70–99)
Potassium: 3.8 meq/L (ref 3.5–5.1)
Sodium: 139 meq/L (ref 135–145)
Total Bilirubin: 0.3 mg/dL (ref 0.2–1.2)
Total Protein: 7.8 g/dL (ref 6.0–8.3)

## 2024-07-31 LAB — CBC WITH DIFFERENTIAL/PLATELET
Basophils Absolute: 0.1 K/uL (ref 0.0–0.1)
Basophils Relative: 1.4 % (ref 0.0–3.0)
Eosinophils Absolute: 0.1 K/uL (ref 0.0–0.7)
Eosinophils Relative: 1.1 % (ref 0.0–5.0)
HCT: 41.9 % (ref 39.0–52.0)
Hemoglobin: 14.5 g/dL (ref 13.0–17.0)
Lymphocytes Relative: 45.7 % (ref 12.0–46.0)
Lymphs Abs: 3.4 K/uL (ref 0.7–4.0)
MCHC: 34.7 g/dL (ref 30.0–36.0)
MCV: 86 fl (ref 78.0–100.0)
Monocytes Absolute: 0.5 K/uL (ref 0.1–1.0)
Monocytes Relative: 6.6 % (ref 3.0–12.0)
Neutro Abs: 3.3 K/uL (ref 1.4–7.7)
Neutrophils Relative %: 45.2 % (ref 43.0–77.0)
Platelets: 398 K/uL (ref 150.0–400.0)
RBC: 4.87 Mil/uL (ref 4.22–5.81)
RDW: 12.6 % (ref 11.5–15.5)
WBC: 7.3 K/uL (ref 4.0–10.5)

## 2024-07-31 LAB — LIPID PANEL
Cholesterol: 224 mg/dL — ABNORMAL HIGH (ref 28–200)
HDL: 32.4 mg/dL — ABNORMAL LOW
LDL Cholesterol: 154 mg/dL — ABNORMAL HIGH (ref 10–99)
NonHDL: 191.15
Total CHOL/HDL Ratio: 7
Triglycerides: 185 mg/dL — ABNORMAL HIGH (ref 10.0–149.0)
VLDL: 37 mg/dL (ref 0.0–40.0)

## 2024-07-31 LAB — HEMOGLOBIN A1C: Hgb A1c MFr Bld: 8.8 % — ABNORMAL HIGH (ref 4.6–6.5)

## 2024-07-31 LAB — VITAMIN B12: Vitamin B-12: 299 pg/mL (ref 211–911)

## 2024-07-31 MED ORDER — TIRZEPATIDE 5 MG/0.5ML ~~LOC~~ SOAJ: 5.0000 mg | SUBCUTANEOUS | 11 refills | Status: AC

## 2024-07-31 MED ORDER — TIRZEPATIDE 2.5 MG/0.5ML ~~LOC~~ SOAJ: 2.5000 mg | SUBCUTANEOUS | 0 refills | Status: AC

## 2024-07-31 NOTE — Telephone Encounter (Signed)
 Pharmacy Patient Advocate Encounter  Received notification from [employer] that Prior Authorization for Mounjaro  2.5MG /0.5ML auto-injectors has been APPROVED from 07/31/24 to 07/31/25. Ran test claim, Copay is $0.00. This test claim was processed through [facility]- copay amounts may vary at other pharmacies due to pharmacy/plan contracts, or as the patient moves through the different stages of their insurance plan.   PA #/Case ID/Reference #: [id number]

## 2024-07-31 NOTE — Patient Instructions (Signed)
 I will be sending in Mounjaro  for you to restart.   Mounjaro  2.5 mg weekly x 1 month, then we will increase the dosage to 5 mg x 1 month, then to 7.5 mg. I want to see you back in 3 months for check in.

## 2024-07-31 NOTE — Telephone Encounter (Signed)
 Pharmacy Patient Advocate Encounter   Received notification from [employer] that prior authorization for Mounjaro  2.5MG /0.5ML auto-injectors  is required/requested.   Insurance verification completed.   The patient is insured through [facility].   Per test claim: PA required; PA started via CoverMyMeds. KEY [id number] . Waiting for clinical questions to populate.

## 2024-07-31 NOTE — Progress Notes (Signed)
 "    Diagnoses and Orders:   1. Uncontrolled type 2 diabetes mellitus with hyperglycemia (HCC)   2. Hyperlipidemia associated with type 2 diabetes mellitus (HCC)   3. Medication management   4. Viral URI   5. History of venous thromboembolism   6. Class 2 severe obesity with serious comorbidity and body mass index (BMI) of 37.0 to 37.9 in adult, unspecified obesity type    Meds ordered this encounter  Medications   tirzepatide  (MOUNJARO ) 5 MG/0.5ML Pen    Sig: Inject 5 mg into the skin once a week. AFTER 4 WEEKS OF 2.5MG  DOSE.    Dispense:  2 mL    Refill:  11   tirzepatide  (MOUNJARO ) 2.5 MG/0.5ML Pen    Sig: Inject 2.5 mg into the skin once a week. FOR 4 WEEKS.    Dispense:  2 mL    Refill:  0   Orders Placed This Encounter  Procedures   Comprehensive metabolic panel with GFR   CBC with Differential/Platelet   Lipid panel   Hemoglobin A1c   Vitamin B12   Assessment & Plan:   Assessment and Plan Assessment & Plan Atypical chest pain Intermittent sharp chest pain, no new findings on CTA, worsening symptoms.  Type 2 diabetes mellitus Recent A1c 8.8 due to illness and medication. Blood sugars >400 during illness. Previously on glimepiride , now transitioning to Mounjaro  for better management and weight control. - Restarted Mounjaro  and will titrate dose to 7.5 or higher. - Continue metformin  until Mounjaro  is at goal dose. - Plan to discontinue glimepiride  and insulin  as blood sugars improve. - Will check B12 since he is taking metformin  chronically - Goal will be to maximize Mounjaro , wean Amaryl , then decrease Lantus   Hyperlipidemia Elevated LDL noted two years ago, not on statin. Mounjaro  may help manage cholesterol. - Monitor cholesterol levels and will consider statin therapy if indicated.  Personal history of recurrent venous thromboembolism Recurrent VTE despite anticoagulation. Possible protein S deficiency. Requires extra heparin  during surgeries. - Documented  history of needing extra heparin  during surgeries.  Acute viral upper respiratory infection Persistent cold symptoms, sinus pressure, possible viral etiology, bacterial infection if symptoms worsen. - Monitor symptoms for signs of bacterial infection. - Advised to report any new fevers or worsening congestion.  General health maintenance Due for pneumonia vaccine, flu vaccine not received in five years, no COVID booster since 2020. Work schedule limits vaccination. - Discussed pneumonia vaccine and flu vaccine options. - Encouraged to report any illness promptly due to work schedule.  Geni Shutter, DO, MS, FAAFP, Dipl. [facility] [address] [place], [address] Dept: [phone] Dept Fax: [phone]  Subjective:   History of Present Illness Bryan Mcmillan is a 53 year old male with a history of clots and diabetes who presents with atypical chest pain.  Chest pain - Sharp, constant, central chest pain occurring even at rest - Pain is frequent and has become a familiar sensation - Occasionally perceives pain as affecting the lungs - No mention of associated shortness of breath, palpitations, or exertional component  Recurrent thromboembolism - History of recurrent blood clots despite use of multiple anticoagulants, now taking twice-daily Lovenox  - Recent CTA showed no new clots - Occasional leg swelling - Wears compression socks  Diabetes mellitus - A1c 8.8 approximately four months ago - Recent blood glucose spikes above 400 mg/dL during a cold while on medications - Uses Dexcom continuous glucose monitor - Previously used low-dose  Mounjaro  with minimal benefit and tolerable nausea and diarrhea - Currently on glimepiride , insulin , and metformin   - Attributes medications to weight gain  Hyperlipidemia - Elevated LDL cholesterol - Has not used statin therapy; previously advised to focus on lifestyle  modifications  Upper respiratory symptoms - Cold symptoms for approximately one week - Left-sided sinus pressure - Uses NyQuil and DayQuil for symptom relief  Immunization status and infectious disease history - No vaccines received since 2020-2021, including pneumonia vaccine - Only one influenza vaccine in the last five years - History of COVID-19 infection after family returned from a cruise  Review of Systems: Negative, with the exception of above mentioned in HPI.  History:   Reviewed by clinician on day of visit: allergies, medications, problem list, medical history, surgical history, family history, social history, and previous encounter notes.    Medications:   Show/hide medication list[1] Allergies[2]  Objective:   BP 128/78 (BP Location: Left Arm, Patient Position: Sitting, Cuff Size: Large)   Pulse 74   Temp 97.6 F (36.4 C) (Oral)   Ht 5' 10 (1.778 m)   Wt 261 lb 12.8 oz (118.8 kg)   SpO2 97%   BMI 37.56 kg/m    Physical Exam Constitutional:      General: He is not in acute distress.    Appearance: He is well-developed.  HENT:     Head: Normocephalic and atraumatic.  Eyes:     Conjunctiva/sclera: Conjunctivae normal.  Cardiovascular:     Rate and Rhythm: Normal rate and regular rhythm.     Heart sounds: Normal heart sounds.  Pulmonary:     Effort: Pulmonary effort is normal.     Breath sounds: Normal breath sounds.  Neurological:     General: No focal deficit present.     Mental Status: He is alert.  Psychiatric:        Behavior: Behavior normal.        Attestations:   Patient is establishing care in this system with me as PCP. Available records reviewed. Chart updated today with reconciliation of problem list, medications, allergies, and relevant history. Preventive care and chronic disease status reviewed.  Outside labs reviewed and will be abstracted. Portions of historical chart may remain incomplete; will update on an ongoing basis as  clinically indicated.  Reviewed by clinician on day of visit: allergies, medications, problem list, medical history, surgical history, family history, social history, and previous encounter notes. Discussed the use of AI scribe software for clinical note transcription with the patient, who gave verbal consent to proceed.    [1]  Outpatient Medications Prior to Visit  Medication Sig   Continuous Glucose Sensor (DEXCOM G7 SENSOR) MISC 1 Device by Does not apply route continuous. Change every 10 days.   enoxaparin  (LOVENOX ) 100 MG/ML injection Inject 1 mL (100 mg total) into the skin every 12 (twelve) hours.   glimepiride  (AMARYL ) 4 MG tablet TAKE 1 TABLET BY MOUTH DAILY BEFORE BREAKFAST   insulin  glargine (LANTUS  SOLOSTAR) 100 UNIT/ML Solostar Pen Inject 35 Units into the skin daily.   Insulin  Pen Needle 32G X 4 MM MISC Use 1x a day   metFORMIN  (GLUCOPHAGE -XR) 500 MG 24 hr tablet Take 4 tablets (2,000 mg total) by mouth daily.   [DISCONTINUED] tirzepatide  (MOUNJARO ) 2.5 MG/0.5ML Pen Inject 2.5 mg into the skin once a week. FOR 4 WEEKS.   [DISCONTINUED] tirzepatide  (MOUNJARO ) 5 MG/0.5ML Pen Inject 5 mg into the skin once a week. AFTER 4 WEEKS OF 2.5MG  DOSE.  No facility-administered medications prior to visit.  [2]  Allergies Allergen Reactions   Norco [Hydrocodone-Acetaminophen ] Other (See Comments)    Paralysis-everything but breathing   Penicillins Other (See Comments)    Did it involve swelling of the face/tongue/throat, SOB, or low BP? N/A Did it involve sudden or severe rash/hives, skin peeling, or any reaction on the inside of your mouth or nose? N/A Did you need to seek medical attention at a hospital or doctor's office? N/A When did it last happen? Child  If all above answers are NO, may proceed with cephalosporin use.   "

## 2024-08-15 ENCOUNTER — Ambulatory Visit: Payer: Self-pay | Admitting: Family Medicine

## 2024-08-31 ENCOUNTER — Encounter: Payer: Self-pay | Admitting: Hematology and Oncology

## 2024-09-21 ENCOUNTER — Inpatient Hospital Stay

## 2024-09-21 ENCOUNTER — Inpatient Hospital Stay: Admitting: Hematology and Oncology
